# Patient Record
Sex: Male | Born: 1935
Health system: Southern US, Community
[De-identification: ages and names within clinical notes are randomized; demographics above are authoritative.]

## PROBLEM LIST (undated history)

## (undated) DIAGNOSIS — I10 Essential (primary) hypertension: Secondary | ICD-10-CM

## (undated) DIAGNOSIS — H9190 Unspecified hearing loss, unspecified ear: Secondary | ICD-10-CM

## (undated) DIAGNOSIS — Z87442 Personal history of urinary calculi: Secondary | ICD-10-CM

## (undated) DIAGNOSIS — E119 Type 2 diabetes mellitus without complications: Secondary | ICD-10-CM

## (undated) DIAGNOSIS — G629 Polyneuropathy, unspecified: Secondary | ICD-10-CM

## (undated) DIAGNOSIS — Z87438 Personal history of other diseases of male genital organs: Secondary | ICD-10-CM

## (undated) DIAGNOSIS — I2510 Atherosclerotic heart disease of native coronary artery without angina pectoris: Secondary | ICD-10-CM

## (undated) DIAGNOSIS — H409 Unspecified glaucoma: Secondary | ICD-10-CM

## (undated) DIAGNOSIS — I359 Nonrheumatic aortic valve disorder, unspecified: Secondary | ICD-10-CM

## (undated) DIAGNOSIS — N200 Calculus of kidney: Secondary | ICD-10-CM

## (undated) DIAGNOSIS — E785 Hyperlipidemia, unspecified: Secondary | ICD-10-CM

## (undated) DIAGNOSIS — I739 Peripheral vascular disease, unspecified: Secondary | ICD-10-CM

## (undated) DIAGNOSIS — Z22322 Carrier or suspected carrier of Methicillin resistant Staphylococcus aureus: Secondary | ICD-10-CM

## (undated) DIAGNOSIS — I219 Acute myocardial infarction, unspecified: Secondary | ICD-10-CM

## (undated) DIAGNOSIS — D649 Anemia, unspecified: Secondary | ICD-10-CM

## (undated) HISTORY — PX: CARDIAC SURGERY: SHX584

## (undated) HISTORY — PX: CORONARY ARTERY BYPASS GRAFT: SHX141

## (undated) HISTORY — PX: CARDIAC VALVE REPLACEMENT: SHX585

## (undated) HISTORY — PX: CARDIAC CATHETERIZATION: SHX172

---

## 2005-04-04 HISTORY — PX: VALVE REPLACEMENT: SUR13

## 2005-11-24 ENCOUNTER — Ambulatory Visit: Payer: Self-pay | Admitting: Internal Medicine

## 2009-05-20 ENCOUNTER — Ambulatory Visit: Payer: Self-pay | Admitting: Urology

## 2009-06-03 ENCOUNTER — Ambulatory Visit: Payer: Self-pay | Admitting: Urology

## 2013-11-07 DIAGNOSIS — IMO0002 Reserved for concepts with insufficient information to code with codable children: Secondary | ICD-10-CM | POA: Insufficient documentation

## 2013-11-07 DIAGNOSIS — D539 Nutritional anemia, unspecified: Secondary | ICD-10-CM | POA: Insufficient documentation

## 2013-11-07 DIAGNOSIS — R809 Proteinuria, unspecified: Secondary | ICD-10-CM | POA: Insufficient documentation

## 2013-11-07 DIAGNOSIS — E785 Hyperlipidemia, unspecified: Secondary | ICD-10-CM | POA: Insufficient documentation

## 2014-04-17 DIAGNOSIS — H4011X3 Primary open-angle glaucoma, severe stage: Secondary | ICD-10-CM | POA: Diagnosis not present

## 2014-06-02 DIAGNOSIS — I2510 Atherosclerotic heart disease of native coronary artery without angina pectoris: Secondary | ICD-10-CM | POA: Diagnosis not present

## 2014-06-02 DIAGNOSIS — I10 Essential (primary) hypertension: Secondary | ICD-10-CM | POA: Diagnosis not present

## 2014-06-02 DIAGNOSIS — H6123 Impacted cerumen, bilateral: Secondary | ICD-10-CM | POA: Diagnosis not present

## 2014-06-02 DIAGNOSIS — E1165 Type 2 diabetes mellitus with hyperglycemia: Secondary | ICD-10-CM | POA: Diagnosis not present

## 2014-06-05 DIAGNOSIS — E1165 Type 2 diabetes mellitus with hyperglycemia: Secondary | ICD-10-CM | POA: Diagnosis not present

## 2014-06-16 DIAGNOSIS — H903 Sensorineural hearing loss, bilateral: Secondary | ICD-10-CM | POA: Diagnosis not present

## 2014-06-16 DIAGNOSIS — H6123 Impacted cerumen, bilateral: Secondary | ICD-10-CM | POA: Diagnosis not present

## 2014-07-14 DIAGNOSIS — Z7901 Long term (current) use of anticoagulants: Secondary | ICD-10-CM | POA: Diagnosis not present

## 2014-08-21 DIAGNOSIS — E1165 Type 2 diabetes mellitus with hyperglycemia: Secondary | ICD-10-CM | POA: Diagnosis not present

## 2014-08-21 DIAGNOSIS — I10 Essential (primary) hypertension: Secondary | ICD-10-CM | POA: Diagnosis not present

## 2014-08-26 DIAGNOSIS — H4011X2 Primary open-angle glaucoma, moderate stage: Secondary | ICD-10-CM | POA: Diagnosis not present

## 2014-08-29 DIAGNOSIS — I2510 Atherosclerotic heart disease of native coronary artery without angina pectoris: Secondary | ICD-10-CM | POA: Diagnosis not present

## 2014-08-29 DIAGNOSIS — I10 Essential (primary) hypertension: Secondary | ICD-10-CM | POA: Diagnosis not present

## 2014-08-29 DIAGNOSIS — E1165 Type 2 diabetes mellitus with hyperglycemia: Secondary | ICD-10-CM | POA: Diagnosis not present

## 2014-10-17 DIAGNOSIS — H8149 Vertigo of central origin, unspecified ear: Secondary | ICD-10-CM | POA: Diagnosis not present

## 2014-10-24 DIAGNOSIS — H8110 Benign paroxysmal vertigo, unspecified ear: Secondary | ICD-10-CM | POA: Diagnosis not present

## 2014-11-04 ENCOUNTER — Emergency Department: Payer: Commercial Managed Care - HMO

## 2014-11-04 ENCOUNTER — Observation Stay
Admission: EM | Admit: 2014-11-04 | Discharge: 2014-11-06 | Disposition: A | Discharge status: Home / Self Care | Payer: Commercial Managed Care - HMO | Attending: Internal Medicine | Admitting: Internal Medicine

## 2014-11-04 DIAGNOSIS — R531 Weakness: Secondary | ICD-10-CM | POA: Diagnosis not present

## 2014-11-04 DIAGNOSIS — I63441 Cerebral infarction due to embolism of right cerebellar artery: Secondary | ICD-10-CM | POA: Diagnosis present

## 2014-11-04 DIAGNOSIS — Z720 Tobacco use: Secondary | ICD-10-CM | POA: Diagnosis not present

## 2014-11-04 DIAGNOSIS — R29898 Other symptoms and signs involving the musculoskeletal system: Secondary | ICD-10-CM | POA: Diagnosis not present

## 2014-11-04 DIAGNOSIS — R42 Dizziness and giddiness: Secondary | ICD-10-CM | POA: Diagnosis not present

## 2014-11-04 DIAGNOSIS — R55 Syncope and collapse: Secondary | ICD-10-CM | POA: Diagnosis not present

## 2014-11-04 DIAGNOSIS — N400 Enlarged prostate without lower urinary tract symptoms: Secondary | ICD-10-CM | POA: Insufficient documentation

## 2014-11-04 DIAGNOSIS — Z8249 Family history of ischemic heart disease and other diseases of the circulatory system: Secondary | ICD-10-CM | POA: Diagnosis not present

## 2014-11-04 DIAGNOSIS — R270 Ataxia, unspecified: Secondary | ICD-10-CM | POA: Insufficient documentation

## 2014-11-04 DIAGNOSIS — S0990XA Unspecified injury of head, initial encounter: Secondary | ICD-10-CM | POA: Diagnosis not present

## 2014-11-04 DIAGNOSIS — E162 Hypoglycemia, unspecified: Secondary | ICD-10-CM | POA: Diagnosis present

## 2014-11-04 DIAGNOSIS — Z8673 Personal history of transient ischemic attack (TIA), and cerebral infarction without residual deficits: Secondary | ICD-10-CM | POA: Insufficient documentation

## 2014-11-04 DIAGNOSIS — I10 Essential (primary) hypertension: Secondary | ICD-10-CM | POA: Diagnosis not present

## 2014-11-04 DIAGNOSIS — M6281 Muscle weakness (generalized): Secondary | ICD-10-CM | POA: Diagnosis not present

## 2014-11-04 DIAGNOSIS — I639 Cerebral infarction, unspecified: Secondary | ICD-10-CM

## 2014-11-04 DIAGNOSIS — E11649 Type 2 diabetes mellitus with hypoglycemia without coma: Secondary | ICD-10-CM | POA: Diagnosis not present

## 2014-11-04 DIAGNOSIS — E785 Hyperlipidemia, unspecified: Secondary | ICD-10-CM | POA: Diagnosis not present

## 2014-11-04 DIAGNOSIS — I739 Peripheral vascular disease, unspecified: Secondary | ICD-10-CM | POA: Insufficient documentation

## 2014-11-04 HISTORY — DX: Essential (primary) hypertension: I10

## 2014-11-04 HISTORY — DX: Type 2 diabetes mellitus without complications: E11.9

## 2014-11-04 LAB — URINALYSIS COMPLETE WITH MICROSCOPIC (ARMC ONLY)
BACTERIA UA: NONE SEEN
BILIRUBIN URINE: NEGATIVE
Glucose, UA: NEGATIVE mg/dL
Hgb urine dipstick: NEGATIVE
KETONES UR: NEGATIVE mg/dL
LEUKOCYTES UA: NEGATIVE
Nitrite: NEGATIVE
Protein, ur: 100 mg/dL — AB
Specific Gravity, Urine: 1.015 (ref 1.005–1.030)
Squamous Epithelial / LPF: NONE SEEN
pH: 5 (ref 5.0–8.0)

## 2014-11-04 LAB — CBC
HEMATOCRIT: 37.8 % — AB (ref 40.0–52.0)
Hemoglobin: 13.2 g/dL (ref 13.0–18.0)
MCH: 31.8 pg (ref 26.0–34.0)
MCHC: 34.8 g/dL (ref 32.0–36.0)
MCV: 91.2 fL (ref 80.0–100.0)
PLATELETS: 248 10*3/uL (ref 150–440)
RBC: 4.14 MIL/uL — ABNORMAL LOW (ref 4.40–5.90)
RDW: 13.8 % (ref 11.5–14.5)
WBC: 7.1 10*3/uL (ref 3.8–10.6)

## 2014-11-04 LAB — GLUCOSE, CAPILLARY
GLUCOSE-CAPILLARY: 106 mg/dL — AB (ref 65–99)
Glucose-Capillary: 56 mg/dL — ABNORMAL LOW (ref 65–99)
Glucose-Capillary: 88 mg/dL (ref 65–99)

## 2014-11-04 LAB — BASIC METABOLIC PANEL
Anion gap: 10 (ref 5–15)
BUN: 17 mg/dL (ref 6–20)
CALCIUM: 9.5 mg/dL (ref 8.9–10.3)
CHLORIDE: 107 mmol/L (ref 101–111)
CO2: 22 mmol/L (ref 22–32)
CREATININE: 1.01 mg/dL (ref 0.61–1.24)
GFR calc Af Amer: >60 mL/min (ref >60)
GFR calc non Af Amer: >60 mL/min (ref >60)
Glucose, Bld: 64 mg/dL — ABNORMAL LOW (ref 65–99)
POTASSIUM: 4.4 mmol/L (ref 3.5–5.1)
SODIUM: 139 mmol/L (ref 135–145)

## 2014-11-04 NOTE — ED Notes (Signed)
Pt provided urinal and instructed to clean catch urine

## 2014-11-04 NOTE — ED Notes (Signed)
Pt reports feeling dizzy x 3 weeks. Pt has been to pcp, prescribed valium and meclizine for vertigo. Pt bs was 56 upon arrival. Given OJ and pt reports improvement of symptoms.

## 2014-11-04 NOTE — ED Provider Notes (Signed)
[facility] Emergency Department Provider Note  Time seen: 10:56 PM  I have reviewed the triage vital signs and the nursing notes.   HISTORY  Chief Complaint Weakness    HPI Matthew Bruce is a 81 y.o. male with past medical history of diabetes and hypertension who presents to the emergency department generalized weakness for 3 weeks. According to the patient and the son the patient has been having intermittent spells of generalized weakness and dizziness. He has seen his primary care doctor (Dr. Jefm Bryant) for the same twice. Patient denies any focal weakness or numbness. Denies headache, slurred speech or confusion. He describes his weakness as the culprit getting out of chairs or out of bed by himself. Upon arrival to the emergency department the patient's blood glucose was 56. The patient was given orange she is, and states his symptoms greatly improved. Describes his weakness is mild currently.     Past Medical History  Diagnosis Date  . Diabetes mellitus without complication   . Hypertension     There are no active problems to display for this patient.   Past Surgical History  Procedure Laterality Date  . Valve replacement    . Cardiac surgery      No current outpatient prescriptions on file.  Allergies Review of patient's allergies indicates no known allergies.  No family history on file.  Social History History  Substance Use Topics  . Smoking status: Former Smoker    Types: Cigarettes  . Smokeless tobacco: Current User    Types: Chew  . Alcohol Use: No    Review of Systems Constitutional: Negative for fever Cardiovascular: Negative for chest pain. Respiratory: Negative for shortness of breath. Gastrointestinal: Negative for abdominal pain Genitourinary: Negative for dysuria. Does state some incontinence but states this is times several years. No acute changes. Musculoskeletal: Negative for back pain 10-point ROS otherwise  negative.  ____________________________________________   PHYSICAL EXAM:  VITAL SIGNS: ED Triage Vitals  Enc Vitals Group     BP 11/04/14 1903 157/59 mmHg     Pulse Rate 11/04/14 1903 64     Resp 11/04/14 1903 16     Temp 11/04/14 1903 98.3 F (36.8 C)     Temp Source 11/04/14 1903 Oral     SpO2 11/04/14 1903 97 %     Weight 11/04/14 1903 202 lb (91.627 kg)     Height 11/04/14 1903 6' (1.829 m)     Head Cir --      Peak Flow --      Pain Score 11/04/14 1904 0     Pain Loc --      Pain Edu? --      Excl. in [place]? --     Constitutional: Alert and oriented. Well appearing and in no distress. ENT   Head: Normocephalic and atraumatic.   Mouth/Throat: Mucous membranes are moist. Cardiovascular: Normal rate, regular rhythm Respiratory: Normal respiratory effort without tachypnea nor retractions. Breath sounds are clear and equal bilaterally. No wheezes/rales/rhonchi. Gastrointestinal: Soft and nontender. No distention. Musculoskeletal: Nontender with normal range of motion in all extremities. No lower extremity tenderness or edema. Neurologic:  Normal speech and language. No gross focal neurologic deficits are appreciated. Speech is normal. 5/5 motor in all extremities. No pronator drift, no lower extremity drift. Skin:  Skin is warm, dry and intact.  Psychiatric: Mood and affect are normal. Speech and behavior are normal.   ____________________________________________    EKG  EKG reviewed and interpreted by myself  shows sinus rhythm at 65 bpm. Occasional PVC. Narrow QRS, left axis deviation, nonspecific but no concerning ST changes noted.  ____________________________________________    [id number]  CT pending  ____________________________________________    INITIAL IMPRESSION / ASSESSMENT AND PLAN / ED COURSE  Pertinent labs & imaging results that were available during my care of the patient were reviewed by me and considered in my medical decision making (see  chart for details).  Patient with intermittent dizziness and lightheadedness. Upon arrival of patient's blood glucose was 56, the patient was given orange juice and felt considerably better and states his symptoms improved. Symptoms could be due to intermittent hypoglycemia. The patient has a glucometer at home but is not been checking his blood glucose regularly. Patient has a good neurologic exam currently, 5/5 motor in all extremities. Lab work has resulted within normal limits. We are currently awaiting urinalysis, and CT head results. I discussed with the patient/son the patient needs to check his blood glucose regularly at home. Patient care signed out to Dr. Owens Shark.  ____________________________________________   FINAL CLINICAL IMPRESSION(S) / ED DIAGNOSES  Weakness   Harvest Dark, MD 11/04/14 [phone]

## 2014-11-04 NOTE — ED Notes (Signed)
Pt brought to ED from [facility]: Matthew Bruce. Pt experiencing weakness in legs and dizziness for the last 3 wks.  Pt sts he last fell Sunday.  Pt A/O x 4.

## 2014-11-04 NOTE — ED Notes (Signed)
Post void bladder scan 13 ML

## 2014-11-05 ENCOUNTER — Encounter: Payer: Self-pay | Admitting: Internal Medicine

## 2014-11-05 ENCOUNTER — Observation Stay: Payer: Commercial Managed Care - HMO

## 2014-11-05 ENCOUNTER — Observation Stay (HOSPITAL_BASED_OUTPATIENT_CLINIC_OR_DEPARTMENT_OTHER)
Admit: 2014-11-05 | Discharge: 2014-11-05 | Disposition: A | Discharge status: Home / Self Care | Payer: Commercial Managed Care - HMO | Attending: Internal Medicine | Admitting: Internal Medicine

## 2014-11-05 DIAGNOSIS — R55 Syncope and collapse: Secondary | ICD-10-CM | POA: Diagnosis not present

## 2014-11-05 DIAGNOSIS — E11649 Type 2 diabetes mellitus with hypoglycemia without coma: Secondary | ICD-10-CM | POA: Diagnosis not present

## 2014-11-05 DIAGNOSIS — I350 Nonrheumatic aortic (valve) stenosis: Secondary | ICD-10-CM

## 2014-11-05 DIAGNOSIS — I10 Essential (primary) hypertension: Secondary | ICD-10-CM | POA: Diagnosis not present

## 2014-11-05 DIAGNOSIS — R531 Weakness: Secondary | ICD-10-CM | POA: Diagnosis not present

## 2014-11-05 DIAGNOSIS — N400 Enlarged prostate without lower urinary tract symptoms: Secondary | ICD-10-CM | POA: Diagnosis not present

## 2014-11-05 DIAGNOSIS — I739 Peripheral vascular disease, unspecified: Secondary | ICD-10-CM | POA: Diagnosis not present

## 2014-11-05 DIAGNOSIS — R270 Ataxia, unspecified: Secondary | ICD-10-CM | POA: Diagnosis not present

## 2014-11-05 DIAGNOSIS — R42 Dizziness and giddiness: Secondary | ICD-10-CM | POA: Diagnosis not present

## 2014-11-05 DIAGNOSIS — I6523 Occlusion and stenosis of bilateral carotid arteries: Secondary | ICD-10-CM | POA: Diagnosis not present

## 2014-11-05 DIAGNOSIS — E785 Hyperlipidemia, unspecified: Secondary | ICD-10-CM | POA: Diagnosis not present

## 2014-11-05 DIAGNOSIS — E162 Hypoglycemia, unspecified: Secondary | ICD-10-CM | POA: Diagnosis present

## 2014-11-05 DIAGNOSIS — G4730 Sleep apnea, unspecified: Secondary | ICD-10-CM | POA: Diagnosis not present

## 2014-11-05 LAB — GLUCOSE, CAPILLARY
GLUCOSE-CAPILLARY: 168 mg/dL — AB (ref 65–99)
GLUCOSE-CAPILLARY: 174 mg/dL — AB (ref 65–99)
GLUCOSE-CAPILLARY: 189 mg/dL — AB (ref 65–99)
GLUCOSE-CAPILLARY: 191 mg/dL — AB (ref 65–99)
GLUCOSE-CAPILLARY: 209 mg/dL — AB (ref 65–99)
GLUCOSE-CAPILLARY: 224 mg/dL — AB (ref 65–99)
GLUCOSE-CAPILLARY: 233 mg/dL — AB (ref 65–99)
GLUCOSE-CAPILLARY: 65 mg/dL (ref 65–99)
Glucose-Capillary: 182 mg/dL — ABNORMAL HIGH (ref 65–99)
Glucose-Capillary: 234 mg/dL — ABNORMAL HIGH (ref 65–99)
Glucose-Capillary: 205 mg/dL — ABNORMAL HIGH (ref 65–99)

## 2014-11-05 LAB — HEMOGLOBIN A1C: HEMOGLOBIN A1C: 6.3 % — AB (ref 4.0–6.0)

## 2014-11-05 MED ORDER — METFORMIN HCL 500 MG PO TABS
1000.0000 mg | ORAL_TABLET | Freq: Two times a day (BID) | ORAL | Status: DC
  Administered 2014-11-05 – 2014-11-06 (×2): 1000 mg via ORAL
  Filled 2014-11-05 (×2): qty 2

## 2014-11-05 MED ORDER — DEXTROSE 10% NICU IV INFUSION SIMPLE
INJECTION | INTRAVENOUS | Status: DC
  Administered 2014-11-05: 100 mL/h via INTRAVENOUS
  Filled 2014-11-05 (×3): qty 500

## 2014-11-05 MED ORDER — TAMSULOSIN HCL 0.4 MG PO CAPS
0.4000 mg | ORAL_CAPSULE | Freq: Every day | ORAL | Status: DC
  Administered 2014-11-05: 0.4 mg via ORAL
  Filled 2014-11-05 (×2): qty 1

## 2014-11-05 MED ORDER — ASPIRIN 81 MG PO CHEW: 81.0000 mg | CHEWABLE_TABLET | Freq: Every day | ORAL | Status: DC

## 2014-11-05 MED ORDER — FINASTERIDE 5 MG PO TABS
5.0000 mg | ORAL_TABLET | Freq: Every day | ORAL | Status: DC
  Administered 2014-11-05 – 2014-11-06 (×2): 5 mg via ORAL
  Filled 2014-11-05 (×2): qty 1

## 2014-11-05 MED ORDER — INSULIN ASPART 100 UNIT/ML ~~LOC~~ SOLN
0.0000 [IU] | Freq: Three times a day (TID) | SUBCUTANEOUS | Status: DC
  Administered 2014-11-05 – 2014-11-06 (×2): 3 [IU] via SUBCUTANEOUS
  Filled 2014-11-05: qty 3
  Filled 2014-11-05: qty 4

## 2014-11-05 MED ORDER — MORPHINE SULFATE 2 MG/ML IJ SOLN: 2.0000 mg | INTRAMUSCULAR | Status: DC | PRN

## 2014-11-05 MED ORDER — ACETAMINOPHEN 325 MG PO TABS: 650.0000 mg | ORAL_TABLET | Freq: Four times a day (QID) | ORAL | Status: DC | PRN

## 2014-11-05 MED ORDER — OXYCODONE HCL 5 MG PO TABS: 5.0000 mg | ORAL_TABLET | ORAL | Status: DC | PRN

## 2014-11-05 MED ORDER — ACETAMINOPHEN 650 MG RE SUPP: 650.0000 mg | Freq: Four times a day (QID) | RECTAL | Status: DC | PRN

## 2014-11-05 MED ORDER — VERAPAMIL HCL ER 120 MG PO TBCR
180.0000 mg | EXTENDED_RELEASE_TABLET | Freq: Every day | ORAL | Status: DC
Start: 2014-11-05 — End: 2014-11-05
  Filled 2014-11-05: qty 2

## 2014-11-05 MED ORDER — HEPARIN SODIUM (PORCINE) 5000 UNIT/ML IJ SOLN
5000.0000 [IU] | Freq: Three times a day (TID) | INTRAMUSCULAR | Status: DC
  Administered 2014-11-05 – 2014-11-06 (×4): 5000 [IU] via SUBCUTANEOUS
  Filled 2014-11-05 (×4): qty 1

## 2014-11-05 MED ORDER — ONDANSETRON HCL 4 MG PO TABS: 4.0000 mg | ORAL_TABLET | Freq: Four times a day (QID) | ORAL | Status: DC | PRN

## 2014-11-05 MED ORDER — INSULIN ASPART 100 UNIT/ML ~~LOC~~ SOLN: 0.0000 [IU] | Freq: Every day | SUBCUTANEOUS | Status: DC

## 2014-11-05 MED ORDER — DEXTROSE-NACL 5-0.9 % IV SOLN
INTRAVENOUS | Status: DC
  Administered 2014-11-05: 02:00:00 via INTRAVENOUS

## 2014-11-05 MED ORDER — AMLODIPINE BESYLATE 5 MG PO TABS
5.0000 mg | ORAL_TABLET | Freq: Every day | ORAL | Status: DC
  Administered 2014-11-05 – 2014-11-06 (×2): 5 mg via ORAL
  Filled 2014-11-05 (×2): qty 1

## 2014-11-05 MED ORDER — ONDANSETRON HCL 4 MG/2ML IJ SOLN: 4.0000 mg | Freq: Four times a day (QID) | INTRAMUSCULAR | Status: DC | PRN

## 2014-11-05 MED ORDER — ASPIRIN EC 81 MG PO TBEC
81.0000 mg | DELAYED_RELEASE_TABLET | Freq: Every day | ORAL | Status: DC
  Administered 2014-11-05 – 2014-11-06 (×2): 81 mg via ORAL
  Filled 2014-11-05 (×2): qty 1

## 2014-11-05 MED ORDER — VERAPAMIL HCL ER 180 MG PO TBCR
180.0000 mg | EXTENDED_RELEASE_TABLET | Freq: Every day | ORAL | Status: DC
  Administered 2014-11-05 – 2014-11-06 (×2): 180 mg via ORAL
  Filled 2014-11-05 (×2): qty 1

## 2014-11-05 MED ORDER — MECLIZINE HCL 25 MG PO TABS: 12.5000 mg | ORAL_TABLET | Freq: Four times a day (QID) | ORAL | Status: DC | PRN

## 2014-11-05 MED ORDER — PRAVASTATIN SODIUM 20 MG PO TABS
40.0000 mg | ORAL_TABLET | Freq: Every day | ORAL | Status: DC
  Administered 2014-11-05: 40 mg via ORAL
  Filled 2014-11-05: qty 2

## 2014-11-05 NOTE — H&P (Signed)
[place] [facility] [place] NAME: Matthew Bruce    MR#:  [id number]  DATE OF BIRTH:  25-Mar-1927   DATE OF ADMISSION:  11/04/2014  PRIMARY CARE PHYSICIAN: walker  REQUESTING/REFERRING PHYSICIAN: Owens Shark  CHIEF COMPLAINT:   Chief Complaint  Patient presents with  . Weakness    HISTORY OF PRESENT ILLNESS:  Matthew Bruce  is a 81 y.o. male with a known history of essential hypertension type 2 diabetes uncomplicated presenting with weakness. He is been complaining of generalized weakness with associated dizziness for approximately 3 weeks in total duration. Upon presentation he was noted to be hypoglycemic after some improvement in his sugar symptoms improved however, he became more hypoglycemic as the night went on requiring fluids.  PAST MEDICAL HISTORY:   Past Medical History  Diagnosis Date  . Diabetes mellitus without complication   . Hypertension     PAST SURGICAL HISTORY:   Past Surgical History  Procedure Laterality Date  . Valve replacement    . Cardiac surgery      SOCIAL HISTORY:   History  Substance Use Topics  . Smoking status: Former Smoker    Types: Cigarettes  . Smokeless tobacco: Current User    Types: Chew  . Alcohol Use: No    FAMILY HISTORY:   Family History  Problem Relation Age of Onset  . Heart failure Neg Hx     DRUG ALLERGIES:  No Known Allergies  REVIEW OF SYSTEMS:  REVIEW OF SYSTEMS:  CONSTITUTIONAL: Denies fevers, chills, fatigue, positive weakness.  EYES: Denies blurred vision, double vision, or eye pain.  EARS, NOSE, THROAT: Denies tinnitus, ear pain, hearing loss.  RESPIRATORY: denies cough, shortness of breath, wheezing  CARDIOVASCULAR: Denies chest pain, palpitations, edema.  GASTROINTESTINAL: Denies nausea, vomiting, diarrhea, abdominal pain.  GENITOURINARY: Denies dysuria, hematuria.  ENDOCRINE: Denies nocturia or thyroid problems. HEMATOLOGIC AND LYMPHATIC: Denies easy  bruising or bleeding.  SKIN: Denies rash or lesions.  MUSCULOSKELETAL: Denies pain in neck, back, shoulder, knees, hips, or further arthritic symptoms.  NEUROLOGIC: Denies paralysis, paresthesias.  PSYCHIATRIC: Denies anxiety or depressive symptoms. Otherwise full review of systems performed by me is negative.   MEDICATIONS AT HOME:   Prior to Admission medications   Not on File      VITAL SIGNS:  Blood pressure 180/63, pulse 50, temperature 98.3 F (36.8 C), temperature source Oral, resp. rate 16, height 6' (1.829 m), weight 202 lb (91.627 kg), SpO2 98 %.  PHYSICAL EXAMINATION:  VITAL SIGNS: Filed Vitals:   11/04/14 2330  BP: 180/63  Pulse: 50  Temp:   Resp:    GENERAL:81 y.o.male currently in no acute distress.  HEAD: Normocephalic, atraumatic.  EYES: Pupils equal, round, reactive to light. Extraocular muscles intact. No scleral icterus.  MOUTH: Moist mucosal membrane. Dentition intact. No abscess noted.  EAR, NOSE, THROAT: Clear without exudates. No external lesions.  NECK: Supple. No thyromegaly. No nodules. No JVD.  PULMONARY: Clear to ascultation, without wheeze rails or rhonci. No use of accessory muscles, Good respiratory effort. good air entry bilaterally CHEST: Nontender to palpation.  CARDIOVASCULAR: S1 and S2. Regular rate and rhythm. No murmurs, rubs, or gallops. No edema. Pedal pulses 2+ bilaterally.  GASTROINTESTINAL: Soft, nontender, nondistended. No masses. Positive bowel sounds. No hepatosplenomegaly.  MUSCULOSKELETAL: No swelling, clubbing, or edema. Range of motion full in all extremities.  NEUROLOGIC: Cranial nerves II through XII are intact. No gross focal neurological deficits. Sensation intact. Reflexes intact.  SKIN: No  ulceration, lesions, rashes, or cyanosis. Skin warm and dry. Turgor intact.  PSYCHIATRIC: Mood, affect within normal limits. The patient is awake, alert and oriented x 3. Insight, judgment intact.    LABORATORY PANEL:    CBC  Recent Labs Lab 11/04/14 1911  WBC 7.1  HGB 13.2  HCT 37.8*  PLT 248   ------------------------------------------------------------------------------------------------------------------  Chemistries   Recent Labs Lab 11/04/14 1911  NA 139  K 4.4  CL 107  CO2 22  GLUCOSE 64*  BUN 17  CREATININE 1.01  CALCIUM 9.5   ------------------------------------------------------------------------------------------------------------------  Cardiac Enzymes No results for input(s): TROPONINI in the last 168 hours. ------------------------------------------------------------------------------------------------------------------  RADIOLOGY:  Ct Head Wo Contrast  11/04/2014   CLINICAL DATA:  Subacute onset of generalized leg weakness and dizziness. Recent fall. Initial encounter.  EXAM: CT HEAD WITHOUT CONTRAST  TECHNIQUE: Contiguous axial images were obtained from the base of the skull through the vertex without intravenous contrast.  COMPARISON:  None.  FINDINGS: There is no evidence of acute infarction, mass lesion, or intra- or extra-axial hemorrhage on CT.  Prominence of the ventricles and sulci reflects moderate cortical volume loss. Cerebellar atrophy is noted. Scattered periventricular and subcortical white matter change likely reflects small vessel ischemic microangiopathy. A few small chronic lacunar infarcts are seen at the right cerebellar hemisphere.  The brainstem and fourth ventricle are within normal limits. The basal ganglia are unremarkable in appearance. The cerebral hemispheres demonstrate grossly normal gray-white differentiation. No mass effect or midline shift is seen.  There is no evidence of fracture; visualized osseous structures are unremarkable in appearance. The visualized portions of the orbits are within normal limits. There is partial opacification of the ethmoid air cells. There is mild partial opacification of the left mastoid air cells. The paranasal sinuses  and right mastoid air cells are well-aerated. No significant soft tissue abnormalities are seen.  IMPRESSION: 1. No acute intracranial pathology seen on CT. 2. Moderate cortical volume loss and scattered small vessel ischemic microangiopathy. Few small chronic lacunar infarcts at the right cerebellar hemisphere. 3. Mild partial opacification of the left mastoid air cells.   Electronically Signed   By: Garald Balding M.D.   On: 11/04/2014 23:20    EKG:   Orders placed or performed during the hospital encounter of 11/04/14  . ED EKG  . ED EKG    IMPRESSION AND PLAN:   81 year old Caucasian gentleman history of type 2 diabetes uncomplicated presenting with generalized weakness found to be hypoglycemic.  1. Type 2 diabetes complicated with hypoglycemia without long-term insulin use: Start D5 infusion monitor Accu-Cheks every 2 hours until glucose stabilize hold diabetic agents 2. Essential hypertension: Verapamil 3. Hypopnea specified: Statin therapy 4. BPH: Flomax 5. Venous embolism prophylactic: Heparin subcutaneous    All the records are reviewed and case discussed with ED provider. Management plans discussed with the patient, family and they are in agreement.  CODE STATUS: Full  TOTAL TIME TAKING CARE OF THIS PATIENT: 35 minutes.    Jenefer Woerner,  Karenann Cai.D on 11/05/2014 at 1:24 AM  Between 7am to 6pm - Pager - [phone]  After 6pm: House Pager: - [phone]  [facility]  Office  [phone]  CC: Primary care physician; Lisette Grinder III

## 2014-11-05 NOTE — ED Provider Notes (Addendum)
I assumed care of the patient at 11:00 PM patient CT scan revealed:    CT Head Wo Contrast (Final result) Result time: 11/04/14 23:20:35   Final result by Rad Results In Interface (11/04/14 23:20:35)   Narrative:   CLINICAL DATA: Subacute onset of generalized leg weakness and dizziness. Recent fall. Initial encounter.  EXAM: CT HEAD WITHOUT CONTRAST  TECHNIQUE: Contiguous axial images were obtained from the base of the skull through the vertex without intravenous contrast.  COMPARISON: None.  FINDINGS: There is no evidence of acute infarction, mass lesion, or intra- or extra-axial hemorrhage on CT.  Prominence of the ventricles and sulci reflects moderate cortical volume loss. Cerebellar atrophy is noted. Scattered periventricular and subcortical white matter change likely reflects small vessel ischemic microangiopathy. A few small chronic lacunar infarcts are seen at the right cerebellar hemisphere.  The brainstem and fourth ventricle are within normal limits. The basal ganglia are unremarkable in appearance. The cerebral hemispheres demonstrate grossly normal gray-white differentiation. No mass effect or midline shift is seen.  There is no evidence of fracture; visualized osseous structures are unremarkable in appearance. The visualized portions of the orbits are within normal limits. There is partial opacification of the ethmoid air cells. There is mild partial opacification of the left mastoid air cells. The paranasal sinuses and right mastoid air cells are well-aerated. No significant soft tissue abnormalities are seen.  IMPRESSION: 1. No acute intracranial pathology seen on CT. 2. Moderate cortical volume loss and scattered small vessel ischemic microangiopathy. Few small chronic lacunar infarcts at the right cerebellar hemisphere. 3. Mild partial opacification of the left mastoid air cells.   Electronically Signed By: Garald Balding M.D. On:  11/04/2014 23:20   Patient's repeat glucose 65 decreased from 106 given long-acting sulfonylurea patient be admitted to the hospital for continued glucose monitoring   Gregor Hams, MD 11/05/14 2778  Gregor Hams, MD 11/05/14 [phone]

## 2014-11-05 NOTE — Plan of Care (Signed)
Problem: Discharge Progression Outcomes Goal: Other Discharge Outcomes/Goals Outcome: Progressing Plan of Care Progress to Goal:  Pt came in last night w/hypoglycemia which seems resolved.  D5 stopped.  Q2hr finger stick d/ced now [employer].  Also metformin started.  Pt c/o severe dizziness.  Old infarcts present in cerebellum. Does have some cerumen build up.  Will have neuro consult.  ECHO performed and U/S of carotids.  Wked w/PT today.  Walked w/rolling walker and a lot of  Assistance.

## 2014-11-05 NOTE — Progress Notes (Signed)
*  PRELIMINARY RESULTS* Echocardiogram 2D Echocardiogram has been performed.  Laqueta Jean Hege 11/05/2014, 12:33 PM

## 2014-11-05 NOTE — Progress Notes (Signed)
[place] at [place] NAME: Matthew Bruce    MR#:  [id number]  DATE OF BIRTH:  1926-11-13  SUBJECTIVE:  CHIEF COMPLAINT:   Chief Complaint  Patient presents with  . Weakness   Continues to feel dizzy with virtually any movement. No nausea. Denies symptoms of vertigo. Blood sugar has returned to normal.  REVIEW OF SYSTEMS:   Review of Systems  Constitutional: Negative for fever.  Respiratory: Negative for shortness of breath.   Cardiovascular: Negative for chest pain and palpitations.  Gastrointestinal: Negative for nausea, vomiting and abdominal pain.  Genitourinary: Negative for dysuria.  Neurological: Positive for dizziness. Negative for tingling, sensory change, speech change, focal weakness and loss of consciousness.    DRUG ALLERGIES:  No Known Allergies  VITALS:  Blood pressure 151/63, pulse 61, temperature 97.8 F (36.6 C), temperature source Oral, resp. rate 16, height 6' (1.829 m), weight 91.627 kg (202 lb), SpO2 96 %.  PHYSICAL EXAMINATION:  GENERAL:  81 y.o.-year-old patient sitting in the bed with no acute distress.  EYES: Pupils equal, round, reactive to light and accommodation. No scleral icterus. Extraocular muscles intact.  HEENT: Head atraumatic, normocephalic. Oropharynx and nasopharynx clear. Mucous membranes are moist  NECK:  Supple, no jugular venous distention. No thyroid enlargement, no tenderness.  LUNGS: Normal breath sounds bilaterally, no wheezing, rales,rhonchi or crepitation. No use of accessory muscles of respiration.  CARDIOVASCULAR: S1, S2 normal. No murmurs, rubs, or gallops.  ABDOMEN: Soft, nontender, nondistended. Bowel sounds present. No organomegaly or mass.  EXTREMITIES: No pedal edema, cyanosis, or clubbing.  NEUROLOGIC: Cranial nerves II through XII are intact. Muscle strength 5/5 in all extremities. Sensation intact. Gait not checked.  PSYCHIATRIC: The patient is alert and oriented  x 3. Calm SKIN: No obvious rash, lesion, or ulcer.    LABORATORY PANEL:   CBC  Recent Labs Lab 11/04/14 1911  WBC 7.1  HGB 13.2  HCT 37.8*  PLT 248   ------------------------------------------------------------------------------------------------------------------  Chemistries   Recent Labs Lab 11/04/14 1911  NA 139  K 4.4  CL 107  CO2 22  GLUCOSE 64*  BUN 17  CREATININE 1.01  CALCIUM 9.5   ------------------------------------------------------------------------------------------------------------------  Cardiac Enzymes No results for input(s): TROPONINI in the last 168 hours. ------------------------------------------------------------------------------------------------------------------  RADIOLOGY:  Ct Head Wo Contrast  11/04/2014   CLINICAL DATA:  Subacute onset of generalized leg weakness and dizziness. Recent fall. Initial encounter.  EXAM: CT HEAD WITHOUT CONTRAST  TECHNIQUE: Contiguous axial images were obtained from the base of the skull through the vertex without intravenous contrast.  COMPARISON:  None.  FINDINGS: There is no evidence of acute infarction, mass lesion, or intra- or extra-axial hemorrhage on CT.  Prominence of the ventricles and sulci reflects moderate cortical volume loss. Cerebellar atrophy is noted. Scattered periventricular and subcortical white matter change likely reflects small vessel ischemic microangiopathy. A few small chronic lacunar infarcts are seen at the right cerebellar hemisphere.  The brainstem and fourth ventricle are within normal limits. The basal ganglia are unremarkable in appearance. The cerebral hemispheres demonstrate grossly normal gray-white differentiation. No mass effect or midline shift is seen.  There is no evidence of fracture; visualized osseous structures are unremarkable in appearance. The visualized portions of the orbits are within normal limits. There is partial opacification of the ethmoid air cells. There is  mild partial opacification of the left mastoid air cells. The paranasal sinuses and right mastoid air cells are well-aerated. No significant soft tissue abnormalities  are seen.  IMPRESSION: 1. No acute intracranial pathology seen on CT. 2. Moderate cortical volume loss and scattered small vessel ischemic microangiopathy. Few small chronic lacunar infarcts at the right cerebellar hemisphere. 3. Mild partial opacification of the left mastoid air cells.   Electronically Signed   By: Garald Balding M.D.   On: 11/04/2014 23:20    EKG:   Orders placed or performed during the hospital encounter of 11/04/14  . ED EKG  . ED EKG  . EKG 12-Lead  . EKG 12-Lead    ASSESSMENT AND PLAN:   #1 hypoglycemia: Likely due to sulfonylurea. Held at this time. Blood sugars are escalating. He can resume metformin, carb modified diet, sliding scale. Check hemoglobin A1c  #2 remote right sided cerebellar CVA: This is likely the cause of his one-month history of dizziness. Check 2-D echocardiogram and carotid Dopplers start aspirin therapy. Physical therapy consultation. Neurology consultation.  #3 hypertension: Blood pressure slightly elevated at this time. Appropriate for age. Continue with home verapamil  #4 hyperlipidemia: Check lipids in the morning. Continue statin therapy  #5 BPH: Continue Flomax and Proscar.  All the records are reviewed and case discussed with Care Management/Social Workerr. Management plans discussed with the patient, family and they are in agreement.  CODE STATUS: Full  TOTAL TIME TAKING CARE OF THIS PATIENT: 35 minutes.  Greater than 50% of time spent in care coordination and counseling. Discussed care plan with the patient, his daughter and wife and also with nursing.  POSSIBLE D/C IN 1-2 DAYS, DEPENDING ON CLINICAL CONDITION.   Myrtis Ser M.D on 11/05/2014 at 12:42 PM  Between 7am to 6pm - Pager - [phone]  After 6pm go to [email or web address] - password [email or web address] [facility]  Office  [phone]  CC: Primary care physician; Emmaline Kluver, MD

## 2014-11-05 NOTE — Evaluation (Signed)
Physical Therapy Evaluation Patient Details Name: Matthew Bruce MRN: [id number] DOB: [birth year] Today's Date: 11/05/2014   History of Present Illness  pt admitted due to unsteadiness likely due to hypoglycemia. pt glucose now within PT treatable levels. pt reports he is feeling much better. pt had been unsteady per H & P x 3 weeks. there was remote right sided cerebellar CVA.   Clinical Impression  Pt presents with reduced LE strength, unsteadiness, impaired gait and balance after having an episode with hypoglycemia and possible remote right sided cerebellar CVA. Pts PLOF does not require AD or assistance for community ambulation.pt demonstrated slight R lean during initial ambulation in the hallway requiring assist and cues with RW. Pt reported feeling more unsteady at ~38f. O2 saturation did reduce to 85%, but recovered after sitting <159m. Pt had less R lean during his trip to the bathroom requiring only CGA and cues for safety. Pt would benefit from continued skilled PT services to address the above stated deficits to maximize safety, mobility and reduce fall risk.     Follow Up Recommendations Outpatient PT    Equipment Recommendations  Rolling walker with 5" wheels rolling walker    Recommendations for Other Services       Precautions / Restrictions        Mobility  Bed Mobility Overal bed mobility: Modified Independent             General bed mobility comments: pt transfered supine><sit/ sit><supine with supervision with bed rails  Transfers Overall transfer level: Modified independent Equipment used: Rolling walker (2 wheeled)             General transfer comment: sit to stand/ stand to sit with CGA using RW, cues for hand placement; Treatment: x 8 min provided +1 CGA assistance to the toilet using RW, assisted pt with undressing/ dressing with CGA for safety. SBA for sit to stand / stand to sit using grab bars from toilet. provided CGA for safety for  handwashing/drying, and CGA for ambuation back to the bed using RW. pt needed cues for hand placement for stand to sit.   Ambulation/Gait Ambulation/Gait assistance: Min assist Ambulation Distance (Feet): 60 Feet Assistive device: Rolling walker (2 wheeled) Gait Pattern/deviations: Step-through pattern;Decreased step length - right;Decreased step length - left;Narrow base of support;Staggering right     General Gait Details: pt ambulated x 6070fn hallway with +1 min A using RW. pt needed cues for safety with RW as well as assistance on the R side as he had a R lean throughout the distance.   Stairs            Wheelchair Mobility    Modified Rankin (Stroke Patients Only)       Balance Overall balance assessment: Modified Independent (R lean)  impaired sitting balance without UE support. Impaired standing balance without UE support.                                          Pertinent Vitals/Pain Pain Assessment: No/denies pain    Home Living Family/patient expects to be discharged to:: Private residence Living Arrangements: Spouse/significant other Available Help at Discharge: Family Type of Home: House Home Access:  (1 small step)     Home Layout: One level Home Equipment: Cane - single point      Prior Function Level of Independence: Independent  Comments: community Contractor        Extremity/Trunk Assessment   Upper Extremity Assessment: Overall WFL for tasks assessed           Lower Extremity Assessment: Generalized weakness         Communication   Communication: HOH  Cognition   Behavior During Therapy: WFL for tasks assessed/performed                        General Comments General comments (skin integrity, edema, etc.): skin intact    Exercises        Assessment/Plan    PT Assessment Patient needs continued PT services  PT Diagnosis Difficulty walking;Generalized  weakness   PT Problem List Decreased strength;Decreased activity tolerance;Decreased balance;Decreased mobility  PT Treatment Interventions DME instruction;Gait training;Functional mobility training;Therapeutic activities;Balance training;Neuromuscular re-education;Therapeutic exercise   PT Goals (Current goals can be found in the Care Plan section) Acute Rehab PT Goals Patient Stated Goal: go home PT Goal Formulation: With patient Time For Goal Achievement: 11/12/14 Potential to Achieve Goals: Good    Frequency Min 2X/week   Barriers to discharge        Co-evaluation               End of Session Equipment Utilized During Treatment: Gait belt Activity Tolerance: Patient tolerated treatment well Patient left: in bed;with bed alarm set;with call bell/phone within reach;with family/visitor present      Functional Limitation: Mobility: Walking and moving around Mobility: Walking and Moving Around Current Status (K0938): At least 40 percent but less than 60 percent impaired, limited or restricted Mobility: Walking and Moving Around Goal Status [phone]): At least 1 percent but less than 20 percent impaired, limited or restricted    Time: 1640-1720 PT Time Calculation (min) (ACUTE ONLY): 40 min   Charges:     PT Treatments $Therapeutic Activity: 8-22 mins   PT G Codes:   PT G-Codes **NOT FOR INPATIENT CLASS** Functional Limitation: Mobility: Walking and moving around Mobility: Walking and Moving Around Current Status (B7169): At least 40 percent but less than 60 percent impaired, limited or restricted Mobility: Walking and Moving Around Goal Status [phone]): At least 1 percent but less than 20 percent impaired, limited or restricted   Caryl Pina C. Nakeesha Bowler, PT, DPT [phone]  Gable Odonohue 11/05/2014, 5:48 PM

## 2014-11-05 NOTE — Discharge Instructions (Signed)
Blood Glucose Monitoring °Monitoring your blood glucose (also know as blood sugar) helps you to manage your diabetes. It also helps you and your health care provider monitor your diabetes and determine how well your treatment plan is working. °WHY SHOULD YOU MONITOR YOUR BLOOD GLUCOSE? °· It can help you understand how food, exercise, and medicine affect your blood glucose. °· It allows you to know what your blood glucose is at any given moment. You can quickly tell if you are having low blood glucose (hypoglycemia) or high blood glucose (hyperglycemia). °· It can help you and your health care provider know how to adjust your medicines. °· It can help you understand how to manage an illness or adjust medicine for exercise. °WHEN SHOULD YOU TEST? °Your health care provider will help you decide how often you should check your blood glucose. This may depend on the type of diabetes you have, your diabetes control, or the types of medicines you are taking. Be sure to write down all of your blood glucose readings so that this information can be reviewed with your health care provider. See below for examples of testing times that your health care provider may suggest. °Type 1 Diabetes °· Test 4 times a day if you are in good control, using an insulin pump, or perform multiple daily injections. °· If your diabetes is not well controlled or if you are sick, you may need to monitor more often. °· It is a good idea to also monitor: °· Before and after exercise. °· Between meals and 2 hours after a meal. °· Occasionally between 2:00 a.m. and 3:00 a.m. °Type 2 Diabetes °· It can vary with each person, but generally, if you are on insulin, test 4 times a day. °· If you take medicines by mouth (orally), test 2 times a day. °· If you are on a controlled diet, test once a day. °· If your diabetes is not well controlled or if you are sick, you may need to monitor more often. °HOW TO MONITOR YOUR BLOOD GLUCOSE °Supplies  Needed °· Blood glucose meter. °· Test strips for your meter. Each meter has its own strips. You must use the strips that go with your own meter. °· A pricking needle (lancet). °· A device that holds the lancet (lancing device). °· A journal or log book to write down your results. °Procedure °· Wash your hands with soap and water. Alcohol is not preferred. °· Prick the side of your finger (not the tip) with the lancet. °· Gently milk the finger until a small drop of blood appears. °· Follow the instructions that come with your meter for inserting the test strip, applying blood to the strip, and using your blood glucose meter. °Other Areas to Get Blood for Testing °Some meters allow you to use other areas of your body (other than your finger) to test your blood. These areas are called alternative sites. The most common alternative sites are: °· The forearm. °· The thigh. °· The back area of the lower leg. °· The palm of the hand. °The blood flow in these areas is slower. Therefore, the blood glucose values you get may be delayed, and the numbers are different from what you would get from your fingers. Do not use alternative sites if you think you are having hypoglycemia. Your reading will not be accurate. Always use a finger if you are having hypoglycemia. Also, if you cannot feel your lows (hypoglycemia unawareness), always use your fingers for your   blood glucose checks. ADDITIONAL TIPS FOR GLUCOSE MONITORING  Do not reuse lancets.  Always carry your supplies with you.  All blood glucose meters have a 24-hour "hotline" number to call if you have questions or need help.  Adjust (calibrate) your blood glucose meter with a control solution after finishing a few boxes of strips. BLOOD GLUCOSE RECORD KEEPING It is a good idea to keep a daily record or log of your blood glucose readings. Most glucose meters, if not all, keep your glucose records stored in the meter. Some meters come with the ability to download  your records to your home computer. Keeping a record of your blood glucose readings is especially helpful if you are wanting to look for patterns. Make notes to go along with the blood glucose readings because you might forget what happened at that exact time. Keeping good records helps you and your health care provider to work together to achieve good diabetes management.  Document Released: 03/24/2003 Document Revised: 08/05/2013 Document Reviewed: 08/13/2012 [facility] Patient Information 2015 [place], [place]. This information is not intended to replace advice given to you by your health care provider. Make sure you discuss any questions you have with your health care provider.  Hypoglycemia Hypoglycemia occurs when the glucose in your blood is too low. Glucose is a type of sugar that is your body's main energy source. Hormones, such as insulin and glucagon, control the level of glucose in the blood. Insulin lowers blood glucose and glucagon increases blood glucose. Having too much insulin in your blood stream, or not eating enough food containing sugar, can result in hypoglycemia. Hypoglycemia can happen to people with or without diabetes. It can develop quickly and can be a medical emergency.  CAUSES   Missing or delaying meals.  Not eating enough carbohydrates at meals.  Taking too much diabetes medicine.  Not timing your oral diabetes medicine or insulin doses with meals, snacks, and exercise.  Nausea and vomiting.  Certain medicines.  Severe illnesses, such as hepatitis, kidney disorders, and certain eating disorders.  Increased activity or exercise without eating something extra or adjusting medicines.  Drinking too much alcohol.  A nerve disorder that affects body functions like your heart rate, blood pressure, and digestion (autonomic neuropathy).  A condition where the stomach muscles do not function properly (gastroparesis). Therefore, medicines and food may not absorb  properly.  Rarely, a tumor of the pancreas can produce too much insulin. SYMPTOMS   Hunger.  Sweating (diaphoresis).  Change in body temperature.  Shakiness.  Headache.  Anxiety.  Lightheadedness.  Irritability.  Difficulty concentrating.  Dry mouth.  Tingling or numbness in the hands or feet.  Restless sleep or sleep disturbances.  Altered speech and coordination.  Change in mental status.  Seizures or prolonged convulsions.  Combativeness.  Drowsiness (lethargic).  Weakness.  Increased heart rate or palpitations.  Confusion.  Pale, gray skin color.  Blurred or double vision.  Fainting. DIAGNOSIS  A physical exam and medical history will be performed. Your caregiver may make a diagnosis based on your symptoms. Blood tests and other lab tests may be performed to confirm a diagnosis. Once the diagnosis is made, your caregiver will see if your signs and symptoms go away once your blood glucose is raised.  TREATMENT  Usually, you can easily treat your hypoglycemia when you notice symptoms.  Check your blood glucose. If it is less than 70 mg/dl, take one of the following:   3-4 glucose tablets.    cup  juice.    cup regular soda.   1 cup skim milk.   -1 tube of glucose gel.   5-6 hard candies.   Avoid high-fat drinks or food that may delay a rise in blood glucose levels.  Do not take more than the recommended amount of sugary foods, drinks, gel, or tablets. Doing so will cause your blood glucose to go too high.   Wait 10-15 minutes and recheck your blood glucose. If it is still less than 70 mg/dl or below your target range, repeat treatment.   Eat a snack if it is more than 1 hour until your next meal.  There may be a time when your blood glucose may go so low that you are unable to treat yourself at home when you start to notice symptoms. You may need someone to help you. You may even faint or be unable to swallow. If you cannot  treat yourself, someone will need to bring you to the hospital.  [place]  If you have diabetes, follow your diabetes management plan by:  Taking your medicines as directed.  Following your exercise plan.  Following your meal plan. Do not skip meals. Eat on time.  Testing your blood glucose regularly. Check your blood glucose before and after exercise. If you exercise longer or different than usual, be sure to check blood glucose more frequently.  Wearing your medical alert jewelry that says you have diabetes.  Identify the cause of your hypoglycemia. Then, develop ways to prevent the recurrence of hypoglycemia.  Do not take a hot bath or shower right after an insulin shot.  Always carry treatment with you. Glucose tablets are the easiest to carry.  If you are going to drink alcohol, drink it only with meals.  Tell friends or family members ways to keep you safe during a seizure. This may include removing hard or sharp objects from the area or turning you on your side.  Maintain a healthy weight. SEEK MEDICAL CARE IF:   You are having problems keeping your blood glucose in your target range.  You are having frequent episodes of hypoglycemia.  You feel you might be having side effects from your medicines.  You are not sure why your blood glucose is dropping so low.  You notice a change in vision or a new problem with your vision. SEEK IMMEDIATE MEDICAL CARE IF:   Confusion develops.  A change in mental status occurs.  The inability to swallow develops.  Fainting occurs. Document Released: 03/21/2005 Document Revised: 03/26/2013 Document Reviewed: 07/18/2011 [facility] Patient Information 2015 [place], [place]. This information is not intended to replace advice given to you by your health care provider. Make sure you discuss any questions you have with your health care provider.

## 2014-11-06 DIAGNOSIS — E11649 Type 2 diabetes mellitus with hypoglycemia without coma: Secondary | ICD-10-CM | POA: Diagnosis not present

## 2014-11-06 DIAGNOSIS — I10 Essential (primary) hypertension: Secondary | ICD-10-CM | POA: Diagnosis not present

## 2014-11-06 DIAGNOSIS — E785 Hyperlipidemia, unspecified: Secondary | ICD-10-CM | POA: Diagnosis not present

## 2014-11-06 DIAGNOSIS — N400 Enlarged prostate without lower urinary tract symptoms: Secondary | ICD-10-CM | POA: Diagnosis not present

## 2014-11-06 DIAGNOSIS — I739 Peripheral vascular disease, unspecified: Secondary | ICD-10-CM | POA: Diagnosis not present

## 2014-11-06 DIAGNOSIS — R55 Syncope and collapse: Secondary | ICD-10-CM | POA: Diagnosis not present

## 2014-11-06 DIAGNOSIS — R531 Weakness: Secondary | ICD-10-CM | POA: Diagnosis not present

## 2014-11-06 DIAGNOSIS — I63439 Cerebral infarction due to embolism of unspecified posterior cerebral artery: Secondary | ICD-10-CM | POA: Diagnosis not present

## 2014-11-06 DIAGNOSIS — I639 Cerebral infarction, unspecified: Secondary | ICD-10-CM | POA: Diagnosis not present

## 2014-11-06 DIAGNOSIS — R42 Dizziness and giddiness: Secondary | ICD-10-CM | POA: Diagnosis not present

## 2014-11-06 DIAGNOSIS — E162 Hypoglycemia, unspecified: Secondary | ICD-10-CM | POA: Diagnosis not present

## 2014-11-06 DIAGNOSIS — R270 Ataxia, unspecified: Secondary | ICD-10-CM | POA: Diagnosis not present

## 2014-11-06 LAB — LIPID PANEL
CHOLESTEROL: 135 mg/dL (ref 0–200)
HDL: 37 mg/dL — ABNORMAL LOW (ref >40)
LDL CALC: 76 mg/dL (ref 0–99)
Total CHOL/HDL Ratio: 3.6 RATIO
Triglycerides: 110 mg/dL (ref <150)
VLDL: 22 mg/dL (ref 0–40)

## 2014-11-06 LAB — CBC
HEMATOCRIT: 36 % — AB (ref 40.0–52.0)
Hemoglobin: 12.5 g/dL — ABNORMAL LOW (ref 13.0–18.0)
MCH: 31.5 pg (ref 26.0–34.0)
MCHC: 34.7 g/dL (ref 32.0–36.0)
MCV: 90.9 fL (ref 80.0–100.0)
Platelets: 176 10*3/uL (ref 150–440)
RBC: 3.96 MIL/uL — ABNORMAL LOW (ref 4.40–5.90)
RDW: 13.7 % (ref 11.5–14.5)
WBC: 6.8 10*3/uL (ref 3.8–10.6)

## 2014-11-06 LAB — BASIC METABOLIC PANEL
ANION GAP: 6 (ref 5–15)
BUN: 21 mg/dL — ABNORMAL HIGH (ref 6–20)
CO2: 26 mmol/L (ref 22–32)
Calcium: 9.5 mg/dL (ref 8.9–10.3)
Chloride: 108 mmol/L (ref 101–111)
Creatinine, Ser: 0.96 mg/dL (ref 0.61–1.24)
GFR calc non Af Amer: >60 mL/min (ref >60)
Glucose, Bld: 106 mg/dL — ABNORMAL HIGH (ref 65–99)
POTASSIUM: 3.8 mmol/L (ref 3.5–5.1)
SODIUM: 140 mmol/L (ref 135–145)

## 2014-11-06 LAB — GLUCOSE, CAPILLARY
GLUCOSE-CAPILLARY: 136 mg/dL — AB (ref 65–99)
GLUCOSE-CAPILLARY: 244 mg/dL — AB (ref 65–99)

## 2014-11-06 MED ORDER — AMLODIPINE BESYLATE 5 MG PO TABS: 5.0000 mg | ORAL_TABLET | Freq: Every day | ORAL | Status: DC

## 2014-11-06 MED ORDER — ASPIRIN 81 MG PO TBEC: 81.0000 mg | DELAYED_RELEASE_TABLET | Freq: Every day | ORAL | Status: DC

## 2014-11-06 NOTE — Plan of Care (Signed)
Problem: Phase I Progression Outcomes Goal: Pain controlled with appropriate interventions Outcome: Completed/Met Date Met:  11/06/14 Pt denies pain at present time. Goal: OOB as tolerated unless otherwise ordered Outcome: Progressing Pt stood to side of bed to void. Goal: Voiding-avoid urinary catheter unless indicated Outcome: Progressing Pt incontinent of urine at times, peri care prn.

## 2014-11-06 NOTE — Progress Notes (Signed)
Physical Therapy Treatment Patient Details Name: Matthew Bruce MRN: [id number] DOB: 05-Dec-1926 Today's Date: 2014/11/22    History of Present Illness pt admitted due to unsteadiness likely due to hypoglycemia. pt glucose now within PT treatable levels. pt reports he is feeling much better. pt had been unsteady per H & P x 3 weeks. there was remote right sided cerebellar CVA.     PT Comments    Ambulated approx 160x1 with RW, cues for maintaining inside walker and in center of RW as RW would tend to drift to L.  Cues for increasing BOS as ankles almost touching. Steady step through gait with slow cadence.  Pt demonstrated mod I transfers sit<>stand and no cues for bed mobility.   Follow Up Recommendations  Outpatient PT     Equipment Recommendations  Rolling walker with 5" wheels    Recommendations for Other Services       Precautions / Restrictions      Mobility  Bed Mobility Overal bed mobility: Modified Independent                Transfers Overall transfer level: Modified independent Equipment used: Rolling walker (2 wheeled)             General transfer comment: required rocking momentum for sit to stand from lower position of bed  Ambulation/Gait Ambulation/Gait assistance: Min assist Ambulation Distance (Feet): 160 Feet Assistive device: Rolling walker (2 wheeled) Gait Pattern/deviations: Step-through pattern;Drifts right/left;Narrow base of support     General Gait Details: pt needed cueing for maintaining center of RW and increasing BOS, noted drifting to L   Stairs            Wheelchair Mobility    Modified Rankin (Stroke Patients Only)       Balance Overall balance assessment: Modified Independent                                  Cognition   Behavior During Therapy: [facility] for tasks assessed/performed                        Exercises General Exercises - Lower Extremity Long Arc Quad:  Strengthening;Both;15 reps;Seated Hip Flexion/Marching: Strengthening;Both;15 reps;Seated    General Comments        Pertinent Vitals/Pain Pain Assessment: No/denies pain    Home Living                      Prior Function            PT Goals (current goals can now be found in the care plan section) Acute Rehab PT Goals Patient Stated Goal: go home PT Goal Formulation: With patient Time For Goal Achievement: 11/12/14 Potential to Achieve Goals: Good    Frequency  Min 2X/week    PT Plan      Co-evaluation             End of Session Equipment Utilized During Treatment: Gait belt Activity Tolerance: Patient tolerated treatment well Patient left: in chair;with call bell/phone within reach;with chair alarm set;with family/visitor present     Time: 9735-3299 PT Time Calculation (min) (ACUTE ONLY): 17 min  Charges:  $Gait Training: 8-22 mins                    G Codes:      Shawnya Mayor 11-22-14, 12:18 PM  Embree Brawley  Murice Barbar, PTA

## 2014-11-06 NOTE — Progress Notes (Signed)
   11/06/14 1231  Clinical Encounter Type  Visited With Patient and family together  Visit Type Initial  Consult/Referral To Chaplain  Spiritual Encounters  Spiritual Needs Emotional  Stress Factors  Patient Stress Factors None identified  Family Stress Factors None identified  Chaplain rounded in unit offered a compassionate presence and spiritual support as applicable. Patient was in the process of being released. Chaplain Dynisha Due A. Anessia [place] Ext. [phone]

## 2014-11-06 NOTE — Consult Note (Signed)
CC: ataxic gait.   HPI: Matthew Bruce is an 81 y.o. male with a known history of essential hypertension type 2 diabetes uncomplicated presenting with weakness. He is been complaining of generalized weakness with associated dizziness for approximately 3 weeks in total duration. Glucose within normal limits. Pt has R cerebellar stroke and falling to R side.   Past Medical History  Diagnosis Date  . Diabetes mellitus without complication   . Hypertension     Past Surgical History  Procedure Laterality Date  . Valve replacement    . Cardiac surgery      Family History  Problem Relation Age of Onset  . Heart failure Neg Hx     Social History:  reports that he has quit smoking. His smoking use included Cigarettes. His smokeless tobacco use includes Chew. He reports that he does not drink alcohol. His drug history is not on file.  No Known Allergies  Medications: I have reviewed the patient's current medications.  ROS: History obtained from chart review  General ROS: negative for - chills, fatigue, fever, night sweats, weight gain or weight loss Psychological ROS: negative for - behavioral disorder, hallucinations, memory difficulties, mood swings or suicidal ideation Ophthalmic ROS: negative for - blurry vision, double vision, eye pain or loss of vision ENT ROS: negative for - epistaxis, nasal discharge, oral lesions, sore throat, tinnitus or vertigo Allergy and Immunology ROS: negative for - hives or itchy/watery eyes Hematological and Lymphatic ROS: negative for - bleeding problems, bruising or swollen lymph nodes Endocrine ROS: negative for - galactorrhea, hair pattern changes, polydipsia/polyuria or temperature intolerance Respiratory ROS: negative for - cough, hemoptysis, shortness of breath or wheezing Cardiovascular ROS: negative for - chest pain, dyspnea on exertion, edema or irregular heartbeat Gastrointestinal ROS: negative for - abdominal pain, diarrhea,  hematemesis, nausea/vomiting or stool incontinence Genito-Urinary ROS: negative for - dysuria, hematuria, incontinence or urinary frequency/urgency Musculoskeletal ROS: negative for - joint swelling or muscular weakness Neurological ROS: as noted in HPI Dermatological ROS: negative for rash and skin lesion changes  Physical Examination: Blood pressure 154/69, pulse 71, temperature 98.3 F (36.8 C), temperature source Oral, resp. rate 20, height 6' (1.829 m), weight 91.627 kg (202 lb), SpO2 97 %.    Neurological Examination Mental Status: Alert, oriented, thought content appropriate.  Speech fluent without evidence of aphasia.  Able to follow 3 step commands without difficulty. Cranial Nerves: II: Discs flat bilaterally; Visual fields grossly normal, pupils equal, round, reactive to light and accommodation III,IV, VI: ptosis not present, extra-ocular motions intact bilaterally V,VII: smile symmetric, facial light touch sensation normal bilaterally VIII: hearing normal bilaterally IX,X: gag reflex present XI: bilateral shoulder shrug XII: midline tongue extension Motor: Right : Upper extremity   5/5    Left:     Upper extremity   5/5  Lower extremity   5/5     Lower extremity   5/5 Tone and bulk:normal tone throughout; no atrophy noted Sensory: Pinprick and light touch intact throughout, bilaterally Deep Tendon Reflexes: 1+ and symmetric throughout Plantars: Right: downgoing   Left: downgoing Cerebellar: Slight dysmetria on R side  Gait: not tested.     Laboratory Studies:   Basic Metabolic Panel:  Recent Labs Lab 11/04/14 1911 11/06/14 0343  NA 139 140  K 4.4 3.8  CL 107 108  CO2 22 26  GLUCOSE 64* 106*  BUN 17 21*  CREATININE 1.01 0.96  CALCIUM 9.5 9.5    Liver Function Tests: No results for input(s):  AST, ALT, ALKPHOS, BILITOT, PROT, ALBUMIN in the last 168 hours. No results for input(s): LIPASE, AMYLASE in the last 168 hours. No results for input(s): AMMONIA  in the last 168 hours.  CBC:  Recent Labs Lab 11/04/14 1911 11/06/14 0343  WBC 7.1 6.8  HGB 13.2 12.5*  HCT 37.8* 36.0*  MCV 91.2 90.9  PLT 248 176    Cardiac Enzymes: No results for input(s): CKTOTAL, CKMB, CKMBINDEX, TROPONINI in the last 168 hours.  BNP: Invalid input(s): POCBNP  CBG:  Recent Labs Lab 11/05/14 1509 11/05/14 1731 11/05/14 2121 11/06/14 0804 11/06/14 1115  GLUCAP 205* 233* 191* 136* 244*    Microbiology: No results found for this or any previous visit.  Coagulation Studies: No results for input(s): LABPROT, INR in the last 72 hours.  Urinalysis:  Recent Labs Lab 11/04/14 2240  COLORURINE YELLOW*  LABSPEC 1.015  PHURINE 5.0  GLUCOSEU NEGATIVE  HGBUR NEGATIVE  BILIRUBINUR NEGATIVE  KETONESUR NEGATIVE  PROTEINUR 100*  NITRITE NEGATIVE  LEUKOCYTESUR NEGATIVE    Lipid Panel:     Component Value Date/Time   CHOL 135 11/06/2014 0343   TRIG 110 11/06/2014 0343   HDL 37* 11/06/2014 0343   CHOLHDL 3.6 11/06/2014 0343   VLDL 22 11/06/2014 0343   LDLCALC 76 11/06/2014 0343    HgbA1C:  Lab Results  Component Value Date   HGBA1C 6.3* 11/04/2014    Urine Drug Screen:  No results found for: LABOPIA, COCAINSCRNUR, LABBENZ, AMPHETMU, THCU, LABBARB  Alcohol Level: No results for input(s): ETH in the last 168 hours.  Other results: EKG: normal EKG, normal sinus rhythm, unchanged from previous tracings.  Imaging: Ct Head Wo Contrast  11/04/2014   CLINICAL DATA:  Subacute onset of generalized leg weakness and dizziness. Recent fall. Initial encounter.  EXAM: CT HEAD WITHOUT CONTRAST  TECHNIQUE: Contiguous axial images were obtained from the base of the skull through the vertex without intravenous contrast.  COMPARISON:  None.  FINDINGS: There is no evidence of acute infarction, mass lesion, or intra- or extra-axial hemorrhage on CT.  Prominence of the ventricles and sulci reflects moderate cortical volume loss. Cerebellar atrophy is noted.  Scattered periventricular and subcortical white matter change likely reflects small vessel ischemic microangiopathy. A few small chronic lacunar infarcts are seen at the right cerebellar hemisphere.  The brainstem and fourth ventricle are within normal limits. The basal ganglia are unremarkable in appearance. The cerebral hemispheres demonstrate grossly normal gray-white differentiation. No mass effect or midline shift is seen.  There is no evidence of fracture; visualized osseous structures are unremarkable in appearance. The visualized portions of the orbits are within normal limits. There is partial opacification of the ethmoid air cells. There is mild partial opacification of the left mastoid air cells. The paranasal sinuses and right mastoid air cells are well-aerated. No significant soft tissue abnormalities are seen.  IMPRESSION: 1. No acute intracranial pathology seen on CT. 2. Moderate cortical volume loss and scattered small vessel ischemic microangiopathy. Few small chronic lacunar infarcts at the right cerebellar hemisphere. 3. Mild partial opacification of the left mastoid air cells.   Electronically Signed   By: Garald Balding M.D.   On: 11/04/2014 23:20   US Carotid Bilateral  11/05/2014   CLINICAL DATA:  Stroke symptoms, syncope, visual disturbance and diabetes  EXAM: BILATERAL CAROTID DUPLEX ULTRASOUND  TECHNIQUE: Pearline Cables scale imaging, color Doppler and duplex ultrasound were performed of bilateral carotid and vertebral arteries in the neck.  COMPARISON:  None.  FINDINGS: Criteria:  Quantification of carotid stenosis is based on velocity parameters that correlate the residual internal carotid diameter with NASCET-based stenosis levels, using the diameter of the distal internal carotid lumen as the denominator for stenosis measurement.  The following velocity measurements were obtained:  RIGHT  ICA:  50/12 cm/sec  CCA:  01/7 cm/sec  SYSTOLIC ICA/CCA RATIO:  0.8  DIASTOLIC ICA/CCA RATIO:  2.6  ECA:  79  cm/sec  LEFT  ICA:  76/16 cm/sec  CCA:  79/3 cm/sec  SYSTOLIC ICA/CCA RATIO:  1.3  DIASTOLIC ICA/CCA RATIO:  1.8  ECA:  80 cm/sec  RIGHT CAROTID ARTERY: Minor echogenic shadowing plaque formation. No hemodynamically significant right ICA stenosis, velocity elevation, or turbulent flow. Degree of narrowing less than 50%.  RIGHT VERTEBRAL ARTERY:  Antegrade  LEFT CAROTID ARTERY: Similar scattered minor echogenic plaque formation. No hemodynamically significant left ICA stenosis, velocity elevation, or turbulent flow.  LEFT VERTEBRAL ARTERY:  Antegrade  IMPRESSION: Minor carotid atherosclerosis. No hemodynamically significant ICA stenosis by ultrasound. Degree of narrowing less than 50% bilaterally.   Electronically Signed   By: Jerilynn Mages.  Shick M.D.   On: 11/05/2014 17:40     Assessment/Plan:  81 y.o. male with a known history of essential hypertension type 2 diabetes uncomplicated presenting with weakness. He is been complaining of generalized weakness with associated dizziness for approximately 3 weeks in total duration. Glucose within normal limits. Pt has R cerebellar stroke and falling to R side.   Likely ataxia from R cerebellar stroke.  Con't ASA  Pt has follow up appointment in few weeks with neurology.  Leotis Pain   11/06/2014, 1:22 PM

## 2014-11-06 NOTE — Care Management Note (Signed)
Case Management Note  Patient Details  Name: Matthew Bruce MRN: [id number] Date of Birth: 1926-07-10  Subjective/Objective:                   Met with patient, wife, and his daughter to discuss discharge planning. Patient states he "feels better" and "hopes to go home today". All deny need for home health PT.     Action/Plan: I left my contact card with daughter and a list of home health agencies. They are aware that they will need to contact PCP for home health orders after discharge but that I could assist them if necessary. No further RNCM needs. Case closed.   Expected Discharge Date:                  Expected Discharge Plan:     In-House Referral:     Discharge planning Services  CM Consult  Post Acute Care Choice:    Choice offered to:  Patient, Spouse, Adult Children  DME Arranged:  N/A DME Agency:     HH Arranged:  NA HH Agency:     Status of Service:  Completed, signed off  Medicare Important Message Given:    Date Medicare IM Given:    Medicare IM give by:    Date Additional Medicare IM Given:    Additional Medicare Important Message give by:     If discussed at [place] of Stay Meetings, dates discussed:    Additional Comments:  Marshell Garfinkel, RN 11/06/2014, 10:18 AM

## 2014-11-10 DIAGNOSIS — I63441 Cerebral infarction due to embolism of right cerebellar artery: Secondary | ICD-10-CM | POA: Diagnosis present

## 2014-11-10 NOTE — Discharge Summary (Signed)
Matthew Bruce at [facility]   PATIENT NAME: Matthew Bruce    MR#:  [id number]  DATE OF BIRTH:  [birth year]  DATE OF ADMISSION:  11/04/2014 ADMITTING PHYSICIAN: Lytle Butte, MD  DATE OF DISCHARGE: 11/06/2014  PRIMARY CARE PHYSICIAN: Emmaline Kluver, MD    ADMISSION DIAGNOSIS:  Hypoglycemia [E16.2] Generalized weakness [R53.1]  DISCHARGE DIAGNOSIS:  Principal Problem:   Hypoglycemia Active Problems:   Embolic stroke involving right cerebellar artery   SECONDARY DIAGNOSIS:   Past Medical History  Diagnosis Date  . Diabetes mellitus without complication   . Hypertension     HOSPITAL COURSE:   #1 hypoglycemia: Initial blood sugars low in the 60s and it was difficult to bring these up even with D50 and D5. This is likely due to sulfonylurea. His hemoglobin A1c is 6.3, this is low for this age group where the goal would be to be under 8.0. I have discontinued his sulfonylurea. He can continue on metformin only. This can be followed by his primary care physician in outpatient setting.  #2 remote right sided cerebellar CVA: This is likely the cause of his one-month history of dizziness. 2-D echocardiogram and carotid Dopplers are normal. He was seen by neurology. He will start aspirin therapy. He did see physical therapy and will have home health physical therapy after discharge.  #3 hypertension: Blood pressure slightly elevated at this time. Appropriate for age. Continue with home verapamil  #4 hyperlipidemia: Continue statin therapy  #5 BPH: Continue Flomax and Proscar.  DISCHARGE CONDITIONS:   Stable  CONSULTS OBTAINED:  Treatment Team:  Leotis Pain, MD  DRUG ALLERGIES:  No Known Allergies  DISCHARGE MEDICATIONS:   Discharge Medication List as of 11/06/2014  1:10 PM    START taking these medications   Details  amLODipine (NORVASC) 5 MG tablet Take 1 tablet (5 mg total) by mouth daily., Starting  11/06/2014, Until Discontinued, Normal    aspirin EC 81 MG EC tablet Take 1 tablet (81 mg total) by mouth daily., Starting 11/06/2014, Until Discontinued, Normal      CONTINUE these medications which have NOT CHANGED   Details  Cholecalciferol 1000 UNITS capsule Take 1,000 Units by mouth daily., Until Discontinued, Historical Med    finasteride (PROSCAR) 5 MG tablet Take 5 mg by mouth daily., Until Discontinued, Historical Med    ibuprofen (ADVIL,MOTRIN) 800 MG tablet Take 800 mg by mouth every 8 (eight) hours as needed., Until Discontinued, Historical Med    latanoprost (XALATAN) 0.005 % ophthalmic solution Place 1 drop into both eyes at bedtime., Until Discontinued, Historical Med    lovastatin (ALTOPREV) 40 MG 24 hr tablet Take 40 mg by mouth at bedtime., Until Discontinued, Historical Med    meclizine (ANTIVERT) 12.5 MG tablet Take 12.5 mg by mouth at bedtime., Until Discontinued, Historical Med    metFORMIN (GLUCOPHAGE) 1000 MG tablet Take 1,000 mg by mouth 2 (two) times daily with a meal., Until Discontinued, Historical Med    Multiple Vitamin (MULTIVITAMIN WITH MINERALS) TABS tablet Take 1 tablet by mouth daily., Until Discontinued, Historical Med    tamsulosin (FLOMAX) 0.4 MG CAPS capsule Take 0.4 mg by mouth daily., Until Discontinued, Historical Med    verapamil (CALAN-SR) 180 MG CR tablet Take 180 mg by mouth at bedtime., Until Discontinued, Historical Med         DISCHARGE INSTRUCTIONS:   See AVS   Today   CHIEF COMPLAINT:   Chief Complaint  Patient  presents with  . Weakness    HISTORY OF PRESENT ILLNESS:  Matthew Bruce is a 81 y.o. male with a known history of essential hypertension type 2 diabetes uncomplicated presenting with weakness. He is been complaining of generalized weakness with associated dizziness for approximately 3 weeks in total duration. Upon presentation he was noted to be hypoglycemic after some improvement in his sugar symptoms improved  however, he became more hypoglycemic as the night went on requiring fluids.  VITAL SIGNS:  Blood pressure 154/69, pulse 71, temperature 98.3 F (36.8 C), temperature source Oral, resp. rate 20, height 6' (1.829 m), weight 91.627 kg (202 lb), SpO2 97 %.  I/O:  No intake or output data in the 24 hours ending 11/10/14 1425  PHYSICAL EXAMINATION:  GENERAL:  81 y.o.-year-old patient lying in the bed with no acute distress.  LUNGS: Normal breath sounds bilaterally, no wheezing, rales,rhonchi or crepitation. No use of accessory muscles of respiration.  CARDIOVASCULAR: S1, S2 normal. No murmurs, rubs, or gallops.  ABDOMEN: Soft, non-tender, non-distended. Bowel sounds present. No organomegaly or mass.  EXTREMITIES: No pedal edema, cyanosis, or clubbing.  NEUROLOGIC: Cranial nerves II through XII are intact. Muscle strength 5/5 in all extremities. Sensation intact. Gait not checked. Her port ataxia and falls  PSYCHIATRIC: The patient is alert and oriented x 3.  SKIN: No obvious rash, lesion, or ulcer.   DATA REVIEW:   CBC  Recent Labs Lab 11/06/14 0343  WBC 6.8  HGB 12.5*  HCT 36.0*  PLT 176    Chemistries   Recent Labs Lab 11/06/14 0343  NA 140  K 3.8  CL 108  CO2 26  GLUCOSE 106*  BUN 21*  CREATININE 0.96  CALCIUM 9.5    Cardiac Enzymes No results for input(s): TROPONINI in the last 168 hours.  Microbiology Results  No results found for this or any previous visit.  RADIOLOGY:  No results found.  EKG:   Orders placed or performed during the hospital encounter of 11/04/14  . ED EKG  . ED EKG  . EKG 12-Lead  . EKG 12-Lead  . EKG      Management plans discussed with the patient, family and they are in agreement.  CODE STATUS: Full  TOTAL TIME TAKING CARE OF THIS PATIENT: 40 minutes.  Greater than 50% of time spent in care coordination and counseling.  Myrtis Ser M.D on 11/10/2014 at 2:25 PM  Between 7am to 6pm - Pager - [phone]  After 6pm  go to [email or web address] - password [email or web address]  Office  [phone]  CC: Primary care physician; Emmaline Kluver, MD

## 2014-11-17 DIAGNOSIS — H6123 Impacted cerumen, bilateral: Secondary | ICD-10-CM | POA: Diagnosis not present

## 2014-11-17 DIAGNOSIS — R42 Dizziness and giddiness: Secondary | ICD-10-CM | POA: Diagnosis not present

## 2014-12-05 DIAGNOSIS — E162 Hypoglycemia, unspecified: Secondary | ICD-10-CM | POA: Diagnosis not present

## 2014-12-12 DIAGNOSIS — E1165 Type 2 diabetes mellitus with hyperglycemia: Secondary | ICD-10-CM | POA: Diagnosis not present

## 2014-12-19 DIAGNOSIS — R42 Dizziness and giddiness: Secondary | ICD-10-CM | POA: Diagnosis not present

## 2014-12-19 DIAGNOSIS — I10 Essential (primary) hypertension: Secondary | ICD-10-CM | POA: Diagnosis not present

## 2014-12-19 DIAGNOSIS — Z0001 Encounter for general adult medical examination with abnormal findings: Secondary | ICD-10-CM | POA: Diagnosis not present

## 2014-12-19 DIAGNOSIS — I2510 Atherosclerotic heart disease of native coronary artery without angina pectoris: Secondary | ICD-10-CM | POA: Diagnosis not present

## 2014-12-19 DIAGNOSIS — D539 Nutritional anemia, unspecified: Secondary | ICD-10-CM | POA: Diagnosis not present

## 2014-12-19 DIAGNOSIS — E1165 Type 2 diabetes mellitus with hyperglycemia: Secondary | ICD-10-CM | POA: Diagnosis not present

## 2014-12-19 DIAGNOSIS — I6310 Cerebral infarction due to embolism of unspecified precerebral artery: Secondary | ICD-10-CM | POA: Diagnosis not present

## 2014-12-29 DIAGNOSIS — H5210 Myopia, unspecified eye: Secondary | ICD-10-CM | POA: Diagnosis not present

## 2014-12-29 DIAGNOSIS — H4011X3 Primary open-angle glaucoma, severe stage: Secondary | ICD-10-CM | POA: Diagnosis not present

## 2015-01-12 DIAGNOSIS — Z23 Encounter for immunization: Secondary | ICD-10-CM | POA: Diagnosis not present

## 2015-03-12 DIAGNOSIS — E1165 Type 2 diabetes mellitus with hyperglycemia: Secondary | ICD-10-CM | POA: Diagnosis not present

## 2015-03-19 DIAGNOSIS — I6310 Cerebral infarction due to embolism of unspecified precerebral artery: Secondary | ICD-10-CM | POA: Diagnosis not present

## 2015-03-19 DIAGNOSIS — I2510 Atherosclerotic heart disease of native coronary artery without angina pectoris: Secondary | ICD-10-CM | POA: Diagnosis not present

## 2015-03-19 DIAGNOSIS — E1165 Type 2 diabetes mellitus with hyperglycemia: Secondary | ICD-10-CM | POA: Diagnosis not present

## 2015-04-20 DIAGNOSIS — H5210 Myopia, unspecified eye: Secondary | ICD-10-CM | POA: Diagnosis not present

## 2015-04-20 DIAGNOSIS — H524 Presbyopia: Secondary | ICD-10-CM | POA: Diagnosis not present

## 2015-04-20 DIAGNOSIS — H401132 Primary open-angle glaucoma, bilateral, moderate stage: Secondary | ICD-10-CM | POA: Diagnosis not present

## 2015-06-11 DIAGNOSIS — E1165 Type 2 diabetes mellitus with hyperglycemia: Secondary | ICD-10-CM | POA: Diagnosis not present

## 2015-06-26 DIAGNOSIS — I10 Essential (primary) hypertension: Secondary | ICD-10-CM | POA: Diagnosis not present

## 2015-06-26 DIAGNOSIS — I2510 Atherosclerotic heart disease of native coronary artery without angina pectoris: Secondary | ICD-10-CM | POA: Diagnosis not present

## 2015-06-26 DIAGNOSIS — I6310 Cerebral infarction due to embolism of unspecified precerebral artery: Secondary | ICD-10-CM | POA: Diagnosis not present

## 2015-06-26 DIAGNOSIS — E1165 Type 2 diabetes mellitus with hyperglycemia: Secondary | ICD-10-CM | POA: Diagnosis not present

## 2015-09-06 DIAGNOSIS — R739 Hyperglycemia, unspecified: Secondary | ICD-10-CM | POA: Diagnosis not present

## 2015-09-06 DIAGNOSIS — R42 Dizziness and giddiness: Secondary | ICD-10-CM | POA: Diagnosis not present

## 2015-09-11 DIAGNOSIS — E1165 Type 2 diabetes mellitus with hyperglycemia: Secondary | ICD-10-CM | POA: Diagnosis not present

## 2015-09-18 DIAGNOSIS — I10 Essential (primary) hypertension: Secondary | ICD-10-CM | POA: Diagnosis not present

## 2015-09-18 DIAGNOSIS — E1165 Type 2 diabetes mellitus with hyperglycemia: Secondary | ICD-10-CM | POA: Diagnosis not present

## 2015-09-18 DIAGNOSIS — I2510 Atherosclerotic heart disease of native coronary artery without angina pectoris: Secondary | ICD-10-CM | POA: Diagnosis not present

## 2015-10-01 DIAGNOSIS — H905 Unspecified sensorineural hearing loss: Secondary | ICD-10-CM | POA: Diagnosis not present

## 2015-10-09 DIAGNOSIS — L03031 Cellulitis of right toe: Secondary | ICD-10-CM | POA: Diagnosis not present

## 2015-10-10 DIAGNOSIS — E1165 Type 2 diabetes mellitus with hyperglycemia: Secondary | ICD-10-CM | POA: Diagnosis not present

## 2015-10-10 DIAGNOSIS — R42 Dizziness and giddiness: Secondary | ICD-10-CM | POA: Diagnosis not present

## 2015-10-10 DIAGNOSIS — L03031 Cellulitis of right toe: Secondary | ICD-10-CM | POA: Diagnosis not present

## 2015-10-12 DIAGNOSIS — B351 Tinea unguium: Secondary | ICD-10-CM | POA: Diagnosis not present

## 2015-10-12 DIAGNOSIS — L97521 Non-pressure chronic ulcer of other part of left foot limited to breakdown of skin: Secondary | ICD-10-CM | POA: Diagnosis not present

## 2015-10-12 DIAGNOSIS — E1142 Type 2 diabetes mellitus with diabetic polyneuropathy: Secondary | ICD-10-CM | POA: Diagnosis not present

## 2015-10-12 DIAGNOSIS — L02619 Cutaneous abscess of unspecified foot: Secondary | ICD-10-CM | POA: Diagnosis not present

## 2015-10-12 DIAGNOSIS — L03119 Cellulitis of unspecified part of limb: Secondary | ICD-10-CM | POA: Diagnosis not present

## 2015-10-20 DIAGNOSIS — E1142 Type 2 diabetes mellitus with diabetic polyneuropathy: Secondary | ICD-10-CM | POA: Diagnosis not present

## 2015-10-20 DIAGNOSIS — L97521 Non-pressure chronic ulcer of other part of left foot limited to breakdown of skin: Secondary | ICD-10-CM | POA: Diagnosis not present

## 2015-11-19 ENCOUNTER — Encounter: Payer: Self-pay | Admitting: Emergency Medicine

## 2015-11-19 ENCOUNTER — Emergency Department: Payer: Commercial Managed Care - HMO

## 2015-11-19 ENCOUNTER — Emergency Department
Admission: EM | Admit: 2015-11-19 | Discharge: 2015-11-19 | Disposition: A | Discharge status: Home / Self Care | Payer: Commercial Managed Care - HMO | Attending: Emergency Medicine | Admitting: Emergency Medicine

## 2015-11-19 DIAGNOSIS — I959 Hypotension, unspecified: Secondary | ICD-10-CM | POA: Diagnosis not present

## 2015-11-19 DIAGNOSIS — R4182 Altered mental status, unspecified: Secondary | ICD-10-CM | POA: Diagnosis not present

## 2015-11-19 DIAGNOSIS — R001 Bradycardia, unspecified: Secondary | ICD-10-CM | POA: Diagnosis not present

## 2015-11-19 DIAGNOSIS — R410 Disorientation, unspecified: Secondary | ICD-10-CM | POA: Diagnosis not present

## 2015-11-19 DIAGNOSIS — J189 Pneumonia, unspecified organism: Secondary | ICD-10-CM | POA: Diagnosis not present

## 2015-11-19 DIAGNOSIS — Z7984 Long term (current) use of oral hypoglycemic drugs: Secondary | ICD-10-CM | POA: Diagnosis not present

## 2015-11-19 DIAGNOSIS — E119 Type 2 diabetes mellitus without complications: Secondary | ICD-10-CM | POA: Diagnosis not present

## 2015-11-19 DIAGNOSIS — Z7982 Long term (current) use of aspirin: Secondary | ICD-10-CM | POA: Diagnosis not present

## 2015-11-19 DIAGNOSIS — Z791 Long term (current) use of non-steroidal anti-inflammatories (NSAID): Secondary | ICD-10-CM | POA: Diagnosis not present

## 2015-11-19 DIAGNOSIS — Z87891 Personal history of nicotine dependence: Secondary | ICD-10-CM | POA: Diagnosis not present

## 2015-11-19 DIAGNOSIS — Z79899 Other long term (current) drug therapy: Secondary | ICD-10-CM | POA: Insufficient documentation

## 2015-11-19 DIAGNOSIS — I10 Essential (primary) hypertension: Secondary | ICD-10-CM | POA: Diagnosis not present

## 2015-11-19 DIAGNOSIS — F17220 Nicotine dependence, chewing tobacco, uncomplicated: Secondary | ICD-10-CM | POA: Diagnosis not present

## 2015-11-19 LAB — TROPONIN I

## 2015-11-19 LAB — CBC WITH DIFFERENTIAL/PLATELET
BASOS ABS: 0.1 10*3/uL (ref 0–0.1)
Basophils Relative: 1 %
EOS ABS: 0.5 10*3/uL (ref 0–0.7)
Eosinophils Relative: 7 %
HCT: 36.3 % — ABNORMAL LOW (ref 40.0–52.0)
Hemoglobin: 12.8 g/dL — ABNORMAL LOW (ref 13.0–18.0)
LYMPHS ABS: 1.5 10*3/uL (ref 1.0–3.6)
Lymphocytes Relative: 20 %
MCH: 31.3 pg (ref 26.0–34.0)
MCHC: 35.2 g/dL (ref 32.0–36.0)
MCV: 89.1 fL (ref 80.0–100.0)
Monocytes Absolute: 0.5 10*3/uL (ref 0.2–1.0)
Monocytes Relative: 7 %
Neutro Abs: 5.1 10*3/uL (ref 1.4–6.5)
Neutrophils Relative %: 65 %
Platelets: 185 10*3/uL (ref 150–440)
RBC: 4.08 MIL/uL — AB (ref 4.40–5.90)
RDW: 13.8 % (ref 11.5–14.5)
WBC: 7.7 10*3/uL (ref 3.8–10.6)

## 2015-11-19 LAB — URINALYSIS COMPLETE WITH MICROSCOPIC (ARMC ONLY)
BACTERIA UA: NONE SEEN
Bilirubin Urine: NEGATIVE
GLUCOSE, UA: NEGATIVE mg/dL
HGB URINE DIPSTICK: NEGATIVE
Ketones, ur: NEGATIVE mg/dL
LEUKOCYTES UA: NEGATIVE
NITRITE: NEGATIVE
PH: 7 (ref 5.0–8.0)
PROTEIN: 30 mg/dL — AB
Specific Gravity, Urine: 1.006 (ref 1.005–1.030)
Squamous Epithelial / LPF: NONE SEEN

## 2015-11-19 LAB — COMPREHENSIVE METABOLIC PANEL
ALT: 17 U/L (ref 17–63)
AST: 21 U/L (ref 15–41)
Albumin: 3.6 g/dL (ref 3.5–5.0)
Alkaline Phosphatase: 57 U/L (ref 38–126)
Anion gap: 7 (ref 5–15)
BUN: 24 mg/dL — ABNORMAL HIGH (ref 6–20)
CO2: 23 mmol/L (ref 22–32)
CREATININE: 1.29 mg/dL — AB (ref 0.61–1.24)
Calcium: 9.5 mg/dL (ref 8.9–10.3)
Chloride: 108 mmol/L (ref 101–111)
GFR calc non Af Amer: 48 mL/min — ABNORMAL LOW (ref >60)
GFR, EST AFRICAN AMERICAN: 56 mL/min — AB (ref >60)
Glucose, Bld: 119 mg/dL — ABNORMAL HIGH (ref 65–99)
Potassium: 5 mmol/L (ref 3.5–5.1)
SODIUM: 138 mmol/L (ref 135–145)
Total Bilirubin: 0.4 mg/dL (ref 0.3–1.2)
Total Protein: 6.1 g/dL — ABNORMAL LOW (ref 6.5–8.1)

## 2015-11-19 MED ORDER — AZITHROMYCIN 500 MG PO TABS
500.0000 mg | ORAL_TABLET | Freq: Once | ORAL | Status: AC
  Administered 2015-11-19: 500 mg via ORAL
  Filled 2015-11-19: qty 1

## 2015-11-19 MED ORDER — SODIUM CHLORIDE 0.9 % IV BOLUS (SEPSIS)
1000.0000 mL | Freq: Once | INTRAVENOUS | Status: AC
  Administered 2015-11-19: 1000 mL via INTRAVENOUS

## 2015-11-19 MED ORDER — AZITHROMYCIN 250 MG PO TABS: ORAL_TABLET | ORAL | 0 refills | Status: AC

## 2015-11-19 NOTE — ED Notes (Signed)
Pt unable to urinate at this time to provide urine specimen.

## 2015-11-19 NOTE — ED Notes (Signed)
Patient transported to X-ray, to CT after.

## 2015-11-19 NOTE — ED Provider Notes (Signed)
[facility] Emergency Department Provider Note   ____________________________________________   First MD Initiated Contact with Patient 11/19/15 1110     (approximate)  I have reviewed the triage vital signs and the nursing notes.   HISTORY  Chief Complaint Altered Mental Status    HPI Matthew Bruce is a 82 y.o. male with a history of diabetes and hypertension is presenting to the emergency department today after an episode of altered mental status. Per his daughter had gotten up and not taking his meds when he became confused. She said the patient was staring blankly at her in a hallway in their home and looking in a closet seemingly without any purpose. She said that he also appeared pale at the time. Should she settle down in a chair. Call Olena Heckle. Medics arrived and found to be slightly hypotensive at 90 systolic and also with a heart rate in the high 40s to 50s. At this time the patient is without any complaint and says that he feels fine. Upon review of his chart as well as as in the family he has had no new medication changes physically to his blood pressure medications.   Past Medical History:  Diagnosis Date  . Diabetes mellitus without complication ([place])   . Hypertension     Patient Active Problem List   Diagnosis Date Noted  . Embolic stroke involving right cerebellar artery ([place]) 11/10/2014  . Hypoglycemia 11/05/2014    Past Surgical History:  Procedure Laterality Date  . CARDIAC SURGERY    . VALVE REPLACEMENT      Prior to Admission medications   Medication Sig Start Date End Date Taking? Authorizing Provider  amLODipine (NORVASC) 5 MG tablet Take 1 tablet (5 mg total) by mouth daily. 11/06/14   Aldean Jewett, MD  aspirin EC 81 MG EC tablet Take 1 tablet (81 mg total) by mouth daily. 11/06/14   Aldean Jewett, MD  Cholecalciferol 1000 UNITS capsule Take 1,000 Units by mouth daily.    Historical Provider, MD  finasteride  (PROSCAR) 5 MG tablet Take 5 mg by mouth daily.    Historical Provider, MD  ibuprofen (ADVIL,MOTRIN) 800 MG tablet Take 800 mg by mouth every 8 (eight) hours as needed.    Historical Provider, MD  latanoprost (XALATAN) 0.005 % ophthalmic solution Place 1 drop into both eyes at bedtime.    Historical Provider, MD  lovastatin (ALTOPREV) 40 MG 24 hr tablet Take 40 mg by mouth at bedtime.    Historical Provider, MD  meclizine (ANTIVERT) 12.5 MG tablet Take 12.5 mg by mouth at bedtime.    Historical Provider, MD  metFORMIN (GLUCOPHAGE) 1000 MG tablet Take 1,000 mg by mouth 2 (two) times daily with a meal.    Historical Provider, MD  Multiple Vitamin (MULTIVITAMIN WITH MINERALS) TABS tablet Take 1 tablet by mouth daily.    Historical Provider, MD  tamsulosin (FLOMAX) 0.4 MG CAPS capsule Take 0.4 mg by mouth daily.    Historical Provider, MD  verapamil (CALAN-SR) 180 MG CR tablet Take 180 mg by mouth at bedtime.    Historical Provider, MD    Allergies Review of patient's allergies indicates no known allergies.  Family History  Problem Relation Age of Onset  . Heart failure Neg Hx     Social History Social History  Substance Use Topics  . Smoking status: Former Smoker    Types: Cigarettes  . Smokeless tobacco: Current User    Types: Chew  . Alcohol  use No    Review of Systems Constitutional: No fever/chills Eyes: No visual changes. ENT: No sore throat. Cardiovascular: Denies chest pain. Respiratory: Denies shortness of breath. Gastrointestinal: No abdominal pain.  No nausea, no vomiting.  No diarrhea.  No constipation. Genitourinary: Negative for dysuria. Musculoskeletal: Negative for back pain. Skin: Negative for rash. Neurological: Negative for headaches, focal weakness or numbness. 10-point ROS otherwise negative.  ____________________________________________   PHYSICAL EXAM:  VITAL SIGNS: ED Triage Vitals  Enc Vitals Group     BP 11/19/15 1106 (!) 168/81     Pulse Rate  11/19/15 1106 (!) 54     Resp 11/19/15 1130 13     Temp 11/19/15 1110 98 F (36.7 C)     Temp Source 11/19/15 1110 Oral     SpO2 11/19/15 1106 96 %     Weight 11/19/15 1112 202 lb (91.6 kg)     Height 11/19/15 1112 '6\' 1"'$  (1.854 m)     Head Circumference --      Peak Flow --      Pain Score --      Pain Loc --      Pain Edu? --      Excl. in [place]? --     Constitutional: Alert and oriented. Well appearing and in no acute distress. Eyes: Conjunctivae are normal. PERRL. EOMI. Head: Atraumatic. Nose: No congestion/rhinnorhea. Mouth/Throat: Mucous membranes are moist.   Neck: No stridor.   Cardiovascular: Normal rate, regular rhythm. Grossly normal heart sounds.  Good peripheral circulation. Respiratory: Normal respiratory effort.  No retractions. Lungs CTAB. Gastrointestinal: Soft and nontender. No distention. No abdominal bruits. No CVA tenderness. Musculoskeletal: No lower extremity tenderness nor edema.  No joint effusions. Neurologic:  Normal speech and language. No gross focal neurologic deficits are appreciated. No gait instability. Skin:  Skin is warm, dry and intact. No rash noted. Psychiatric: Mood and affect are normal. Speech and behavior are normal.  ____________________________________________   LABS (all labs ordered are listed, but only abnormal results are displayed)  Labs Reviewed  CBC WITH DIFFERENTIAL/PLATELET - Abnormal; Notable for the following:       Result Value   RBC 4.08 (*)    Hemoglobin 12.8 (*)    HCT 36.3 (*)    All other components within normal limits  COMPREHENSIVE METABOLIC PANEL - Abnormal; Notable for the following:    Glucose, Bld 119 (*)    BUN 24 (*)    Creatinine, Ser 1.29 (*)    Total Protein 6.1 (*)    GFR calc non Af Amer 48 (*)    GFR calc Af Amer 56 (*)    All other components within normal limits  URINALYSIS COMPLETEWITH MICROSCOPIC ([facility] ONLY) - Abnormal; Notable for the following:    Color, Urine STRAW (*)    APPearance  CLEAR (*)    Protein, ur 30 (*)    All other components within normal limits  TROPONIN I   ____________________________________________  EKG  ED ECG REPORT I, Doran Stabler, the attending physician, personally viewed and interpreted this ECG.   Date: 11/19/2015  EKG Time: 1107  Rate: 51  Rhythm: sinus bradycardia  Axis: Normal axis  Intervals:none  ST&T Change: T-wave inversion in aVL. Otherwise no ST elevation or depression or abnormal T-wave inversion. Similar to the EKG done on 11/04/2014. No significant change except for the now presents bradycardia. ____________________________________________  RADIOLOGY  CT Head Wo Contrast (Accession [id number]) (Order [id number])  Imaging  Date: 11/19/2015 Department:  [place] EMERGENCY DEPARTMENT Released By/Authorizing: Orbie Pyo, MD (auto-released)  PACS Images   Show images for CT Head Wo Contrast  Study Result   CLINICAL DATA:  Altered mental status  EXAM: CT HEAD WITHOUT CONTRAST  TECHNIQUE: Contiguous axial images were obtained from the base of the skull through the vertex without intravenous contrast.  COMPARISON:  11/04/2014  FINDINGS: Brain: No intracranial hemorrhage, mass effect or midline shift. Stable atrophy and chronic white matter disease. Ventricular size is stable from prior exam. No acute cortical infarction. No mass lesion is noted on this unenhanced scan.  Vascular: Atherosclerotic calcifications of carotid siphon.  Skull: No skull fracture is noted.  Sinuses/Orbits: There is mucosal thickening with partial opacification ethmoid air cells. Mucosal thickening with partial opacification right maxillary sinus. The mastoid air cells are unremarkable.  Other: None  IMPRESSION: No acute intracranial abnormality. No definite acute cortical infarction. Stable atrophy and chronic white matter disease. Paranasal sinuses disease as described  above.   Electronically Signed   By: Lahoma Crocker M.D.   On: 11/19/2015 12:56    DG Chest 2 View (Accession [id number]) (Order [id number])  Imaging  Date: 11/19/2015 Department: [facility] EMERGENCY DEPARTMENT Released By/Authorizing: Orbie Pyo, MD (auto-released)  PACS Images   Show images for DG Chest 2 View  Study Result   CLINICAL DATA:  Altered mental status.  EXAM: CHEST  2 VIEW  COMPARISON:  None.  FINDINGS: Heart size and mediastinal contours are normal. Atherosclerotic changes noted at the aortic arch. Median sternotomy wires appear intact and normally aligned.  Platelike opacity noted in the right upper lobe. Lungs otherwise clear. No pleural effusion or pneumothorax seen. No acute or suspicious osseous finding.  IMPRESSION: Platelike opacity in the right upper lobe, highly suspicious for pneumonia. Recommend follow-up chest x-ray to ensure resolution and prove benignity.  Aortic atherosclerosis.   Electronically Signed   By: Franki Cabot M.D.   On: 11/19/2015 12:42      ____________________________________________   PROCEDURES  Procedure(s) performed:   Procedures  Critical Care performed:   ____________________________________________   INITIAL IMPRESSION / ASSESSMENT AND PLAN / ED COURSE  Pertinent labs & imaging results that were available during my care of the patient were reviewed by me and considered in my medical decision making (see chart for details).  ----------------------------------------- 2:55 PM on 11/19/2015 -----------------------------------------  Patient now with blood pressure that is in the 371I systolic. However, the heart rate remains in the 50s. I discussed the case with the patient's primary care doctor, Dr. Gilford Rile, who recommends taking the patient off the verapamil. We will keep the patient on the amlodipine. Unclear cause of the patient's bradycardia but he has  been sinus on the monitor. He has no complaints at this time. He has not been confused and is at his baseline mental status. He has 3 children at the bedside and I explained to them The plan to them as well to discontinue the verapamil and continue amlodipine and they're understanding and willing to comply.  Furthermore, the patient was found a possible pneumonia in his right upper field. His daughter is now saying that he was having a runny nose and also cough during the week. I'll be prescribing the patient outpatient anabiotic to cover for the pneumonia. The family as well as the patient were also aware that they will need a chest x-ray to be repeated to make sure that the lesion is resolving. We did discuss that  this could also represent early growth of the cancer and that is why the repeat chest x-ray would be necessary. They're understanding and willing to comply with this plan.     Clinical Course     ____________________________________________   FINAL CLINICAL IMPRESSION(S) / ED DIAGNOSES  Bradycardia. Confusion. Right upper lobe pneumonia.    NEW MEDICATIONS STARTED DURING THIS VISIT:  New Prescriptions   No medications on file     Note:  This document was prepared using Dragon voice recognition software and may include unintentional dictation errors.    Orbie Pyo, MD 11/19/15 [phone]

## 2015-11-19 NOTE — ED Triage Notes (Signed)
Pt to ED via EMS for altered mental status, call made by daughter.  Pt alert on arrival and oriented x3, unoriented to situation. Pt states he does not know why he had to come to ED. EMS reports BP 90/40, for which they administered 248m NS and BP on arrival 168/81.  HR in 50's.

## 2015-11-26 DIAGNOSIS — I10 Essential (primary) hypertension: Secondary | ICD-10-CM | POA: Diagnosis not present

## 2015-11-26 DIAGNOSIS — R001 Bradycardia, unspecified: Secondary | ICD-10-CM | POA: Diagnosis not present

## 2015-12-11 DIAGNOSIS — Z23 Encounter for immunization: Secondary | ICD-10-CM | POA: Diagnosis not present

## 2016-01-26 DIAGNOSIS — L97529 Non-pressure chronic ulcer of other part of left foot with unspecified severity: Secondary | ICD-10-CM | POA: Diagnosis not present

## 2016-01-28 DIAGNOSIS — L97529 Non-pressure chronic ulcer of other part of left foot with unspecified severity: Secondary | ICD-10-CM | POA: Diagnosis not present

## 2016-02-01 DIAGNOSIS — L97521 Non-pressure chronic ulcer of other part of left foot limited to breakdown of skin: Secondary | ICD-10-CM | POA: Diagnosis not present

## 2016-02-01 DIAGNOSIS — E1142 Type 2 diabetes mellitus with diabetic polyneuropathy: Secondary | ICD-10-CM | POA: Diagnosis not present

## 2016-02-01 DIAGNOSIS — Z0000 Encounter for general adult medical examination without abnormal findings: Secondary | ICD-10-CM | POA: Diagnosis not present

## 2016-02-01 DIAGNOSIS — I96 Gangrene, not elsewhere classified: Secondary | ICD-10-CM | POA: Diagnosis not present

## 2016-02-02 ENCOUNTER — Other Ambulatory Visit
Admission: RE | Admit: 2016-02-02 | Discharge: 2016-02-02 | Disposition: A | Discharge status: Home / Self Care | Payer: Commercial Managed Care - HMO | Source: Ambulatory Visit | Attending: Vascular Surgery | Admitting: Vascular Surgery

## 2016-02-02 ENCOUNTER — Ambulatory Visit (INDEPENDENT_AMBULATORY_CARE_PROVIDER_SITE_OTHER): Payer: Commercial Managed Care - HMO | Admitting: Vascular Surgery

## 2016-02-02 ENCOUNTER — Encounter (INDEPENDENT_AMBULATORY_CARE_PROVIDER_SITE_OTHER): Payer: Self-pay | Admitting: Vascular Surgery

## 2016-02-02 ENCOUNTER — Encounter (INDEPENDENT_AMBULATORY_CARE_PROVIDER_SITE_OTHER): Payer: Self-pay

## 2016-02-02 ENCOUNTER — Other Ambulatory Visit (INDEPENDENT_AMBULATORY_CARE_PROVIDER_SITE_OTHER): Payer: Self-pay | Admitting: Vascular Surgery

## 2016-02-02 VITALS — BP 129/66 | HR 79 | Resp 16 | Ht 72.0 in | Wt 204.8 lb

## 2016-02-02 DIAGNOSIS — Z7982 Long term (current) use of aspirin: Secondary | ICD-10-CM | POA: Insufficient documentation

## 2016-02-02 DIAGNOSIS — I38 Endocarditis, valve unspecified: Secondary | ICD-10-CM

## 2016-02-02 DIAGNOSIS — Z7984 Long term (current) use of oral hypoglycemic drugs: Secondary | ICD-10-CM | POA: Insufficient documentation

## 2016-02-02 DIAGNOSIS — Z8673 Personal history of transient ischemic attack (TIA), and cerebral infarction without residual deficits: Secondary | ICD-10-CM | POA: Insufficient documentation

## 2016-02-02 DIAGNOSIS — E119 Type 2 diabetes mellitus without complications: Secondary | ICD-10-CM | POA: Insufficient documentation

## 2016-02-02 DIAGNOSIS — Z952 Presence of prosthetic heart valve: Secondary | ICD-10-CM | POA: Insufficient documentation

## 2016-02-02 DIAGNOSIS — I70268 Atherosclerosis of native arteries of extremities with gangrene, other extremity: Secondary | ICD-10-CM | POA: Insufficient documentation

## 2016-02-02 DIAGNOSIS — I70262 Atherosclerosis of native arteries of extremities with gangrene, left leg: Secondary | ICD-10-CM | POA: Diagnosis not present

## 2016-02-02 DIAGNOSIS — I10 Essential (primary) hypertension: Secondary | ICD-10-CM | POA: Insufficient documentation

## 2016-02-02 DIAGNOSIS — I63441 Cerebral infarction due to embolism of right cerebellar artery: Secondary | ICD-10-CM

## 2016-02-02 DIAGNOSIS — E1152 Type 2 diabetes mellitus with diabetic peripheral angiopathy with gangrene: Secondary | ICD-10-CM

## 2016-02-02 DIAGNOSIS — Z87891 Personal history of nicotine dependence: Secondary | ICD-10-CM | POA: Insufficient documentation

## 2016-02-02 DIAGNOSIS — L98499 Non-pressure chronic ulcer of skin of other sites with unspecified severity: Secondary | ICD-10-CM

## 2016-02-02 DIAGNOSIS — I70209 Unspecified atherosclerosis of native arteries of extremities, unspecified extremity: Secondary | ICD-10-CM | POA: Insufficient documentation

## 2016-02-02 LAB — CREATININE, SERUM
CREATININE: 1.58 mg/dL — AB (ref 0.61–1.24)
GFR, EST AFRICAN AMERICAN: 44 mL/min — AB (ref >60)
GFR, EST NON AFRICAN AMERICAN: 38 mL/min — AB (ref >60)

## 2016-02-02 LAB — BUN: BUN: 29 mg/dL — ABNORMAL HIGH (ref 6–20)

## 2016-02-02 NOTE — Assessment & Plan Note (Signed)
This is clearly a critical and limb threatening situation. The patient has obvious gangrene of his left third toe. He will have to have the toe removed, but I am very concerned that his perfusion will not allow for this to heal. I have discussed without improvement perfusion, I would expect him to require major amputation. I would recommend an angiogram be performed in the very near future at his convenience. I have discussed the risks and benefits of the angiogram procedure. I have discussed the bypass surgery might be necessary if endovascular treatment is not an option. We would not know this until the time of the procedure. I've recommended further assessment with noninvasive studies of his right leg in the future as he does not have good pulses on the right side either. He does not have any major, critical limb threatening symptoms on the right although he does have a small scab on the right fourth toe that I am somewhat concerned about. I discussed the importance of glucose control and blood pressure regulation. He stopped smoking some years ago. He is on appropriate medical therapy with aspirin and we will likely add a statin agent. His angiogram is scheduled for this week.

## 2016-02-02 NOTE — Progress Notes (Signed)
Patient ID: Matthew Bruce, male   DOB: May 21, 1926, 82 y.o.   MRN: [id number]  Chief Complaint  Patient presents with  . New Patient (Initial Visit)    HPI Matthew Bruce is a 82 y.o. male.  I am asked to see the patient by Dr. Elvina Mattes for evaluation of of his circulation in the setting of a gangrenous third toe.  The patient reports he was seen for this earlier in the summer, but did not proceed with the recommended vascular evaluation and now his toe has turned frankly gangrenous. It throbs and hurts from time to time. He has some neuropathy from his diabetes and he does not complain of pain all the time. His activity is somewhat limited so he does not really describe claudication. He has a small scab on his right fourth toe but no right leg symptoms at this time. He denies fevers or chills or signs of systemic infection. His podiatrist recommended he get his circulation intact as he will need to have his toe amputated. He does not have any clear inciting event or causative factor that started the gangrene. Nothing has made it better.   Past Medical History:  Diagnosis Date  . Diabetes mellitus without complication ([place])   . Hypertension     Past Surgical History:  Procedure Laterality Date  . CARDIAC SURGERY    . VALVE REPLACEMENT      Family History  Problem Relation Age of Onset  . Heart failure Neg Hx   No history of bleeding disorders, clotting disorders, or autoimmune diseases  Social History Social History  Substance Use Topics  . Smoking status: Former Smoker    Types: Cigarettes  . Smokeless tobacco: Current User    Types: Chew  . Alcohol use No  No IV drug use  No Known Allergies  Current Outpatient Prescriptions  Medication Sig Dispense Refill  . amLODipine (NORVASC) 5 MG tablet Take 1 tablet (5 mg total) by mouth daily. 30 tablet 0  . aspirin EC 81 MG EC tablet Take 1 tablet (81 mg total) by mouth daily. 30 tablet 0  . Cholecalciferol 1000 UNITS  capsule Take 1,000 Units by mouth daily.    . ciprofloxacin (CIPRO) 500 MG tablet 2 (two) times daily.     Marland Kitchen doxycycline (VIBRAMYCIN) 100 MG capsule Take by mouth.    Marland Kitchen ibuprofen (ADVIL,MOTRIN) 800 MG tablet Take 800 mg by mouth every 8 (eight) hours as needed.    . latanoprost (XALATAN) 0.005 % ophthalmic solution Place 1 drop into both eyes at bedtime.    . lovastatin (ALTOPREV) 40 MG 24 hr tablet Take 40 mg by mouth at bedtime.    . meclizine (ANTIVERT) 12.5 MG tablet Take 12.5 mg by mouth at bedtime.    . metFORMIN (GLUCOPHAGE) 1000 MG tablet Take 1,000 mg by mouth 2 (two) times daily with a meal.    . Multiple Vitamin (MULTIVITAMIN WITH MINERALS) TABS tablet Take 1 tablet by mouth daily.    . tamsulosin (FLOMAX) 0.4 MG CAPS capsule Take 0.4 mg by mouth daily.    . verapamil (CALAN-SR) 180 MG CR tablet Take 180 mg by mouth at bedtime.    . finasteride (PROSCAR) 5 MG tablet Take 5 mg by mouth daily.    . tamsulosin (FLOMAX) 0.4 MG CAPS capsule TAKE 1 CAPSULE ONE TIME DAILY     No current facility-administered medications for this visit.       REVIEW OF SYSTEMS (Negative unless checked)  Constitutional: [] Weight loss  [] Fever  [] Chills Cardiac: [] Chest pain   [] Chest pressure   [] Palpitations   [] Shortness of breath when laying flat   [] Shortness of breath at rest   [] Shortness of breath with exertion. Vascular:  [x] Pain in legs with walking   [] Pain in legs at rest   [] Pain in legs when laying flat   [] Claudication   [] Pain in feet when walking  [] Pain in feet at rest  [] Pain in feet when laying flat   [] History of DVT   [] Phlebitis   [] Swelling in legs   [] Varicose veins   [x] Non-healing ulcers Pulmonary:   [] Uses home oxygen   [] Productive cough   [] Hemoptysis   [] Wheeze  [] COPD   [] Asthma Neurologic:  [] Dizziness  [] Blackouts   [] Seizures   [] History of stroke   [] History of TIA  [] Aphasia   [] Temporary blindness   [] Dysphagia   [] Weakness or numbness in arms   [x] Weakness or numbness  in legs Musculoskeletal:  [] Arthritis   [] Joint swelling   [] Joint pain   [] Low back pain Hematologic:  [] Easy bruising  [] Easy bleeding   [] Hypercoagulable state   [] Anemic  [] Hepatitis Gastrointestinal:  [] Blood in stool   [] Vomiting blood  [] Gastroesophageal reflux/heartburn   [] Abdominal pain Genitourinary:  [] Chronic kidney disease   [] Difficult urination  [] Frequent urination  [] Burning with urination   [] Hematuria Skin:  [] Rashes   [x] Ulcers   [x] Wounds Psychological:  [] History of anxiety   []  History of major depression.    Physical Exam BP 129/66   Pulse 79   Resp 16   Ht 6' (1.829 m)   Wt 204 lb 12.8 oz (92.9 kg)   BMI 27.78 kg/m  Gen:  WD/WN, NAD Head: [place]/AT, No temporalis wasting. Prominent temp pulse not noted. Ear/Nose/Throat: Hearing grossly intact, nares w/o erythema or drainage, oropharynx w/o Erythema/Exudate Eyes: Conjunctiva clear, sclera non-icteric  Neck: trachea midline.  No bruit or JVD.  Pulmonary:  Good air movement, clear to auscultation bilaterally.  Cardiac: RRR, normal S1, S2, no Murmurs, rubs or gallops. Vascular:  Vessel Right Left  Radial Palpable Palpable  Ulnar Palpable Palpable  Brachial Palpable Palpable  Carotid Palpable, without bruit Palpable, without bruit  Aorta Not palpable N/A  Femoral Palpable Palpable  Popliteal 1+ Palpable Not Palpable  PT Not Palpable Trace Palpable  DP 1+ Palpable Not Palpable   Gastrointestinal: soft, non-tender/non-distended. No guarding/reflex. No masses, surgical incisions, or scars. Musculoskeletal: M/S 5/5 throughout.  Gangrenous left third toe. 1+ BLE edema. Neurologic: Sensation seems diminished in extremities.  Symmetrical.  Speech is fluent. Motor exam as listed above. Psychiatric: Judgment intact, Mood & affect appropriate for pt's clinical situation. Dermatologic: Left third toe clearly gangrenous with minimal erythema surrounding it. Lymph : No Cervical, Axillary, or Inguinal  lymphadenopathy.   Radiology No results found.  Labs Recent Results (from the past 2160 hour(s))  CBC with Differential     Status: Abnormal   Collection Time: 11/19/15 11:11 AM  Result Value Ref Range   WBC 7.7 3.8 - 10.6 K/uL   RBC 4.08 (L) 4.40 - 5.90 MIL/uL   Hemoglobin 12.8 (L) 13.0 - 18.0 g/dL   HCT 36.3 (L) 40.0 - 52.0 %   MCV 89.1 80.0 - 100.0 fL   MCH 31.3 26.0 - 34.0 pg   MCHC 35.2 32.0 - 36.0 g/dL   RDW 13.8 11.5 - 14.5 %   Platelets 185 150 - 440 K/uL   Neutrophils Relative % 65 %   Neutro  Abs 5.1 1.4 - 6.5 K/uL   Lymphocytes Relative 20 %   Lymphs Abs 1.5 1.0 - 3.6 K/uL   Monocytes Relative 7 %   Monocytes Absolute 0.5 0.2 - 1.0 K/uL   Eosinophils Relative 7 %   Eosinophils Absolute 0.5 0 - 0.7 K/uL   Basophils Relative 1 %   Basophils Absolute 0.1 0 - 0.1 K/uL  Comprehensive metabolic panel     Status: Abnormal   Collection Time: 11/19/15 11:11 AM  Result Value Ref Range   Sodium 138 135 - 145 mmol/L   Potassium 5.0 3.5 - 5.1 mmol/L   Chloride 108 101 - 111 mmol/L   CO2 23 22 - 32 mmol/L   Glucose, Bld 119 (H) 65 - 99 mg/dL   BUN 24 (H) 6 - 20 mg/dL   Creatinine, Ser 1.29 (H) 0.61 - 1.24 mg/dL   Calcium 9.5 8.9 - 10.3 mg/dL   Total Protein 6.1 (L) 6.5 - 8.1 g/dL   Albumin 3.6 3.5 - 5.0 g/dL   AST 21 15 - 41 U/L   ALT 17 17 - 63 U/L   Alkaline Phosphatase 57 38 - 126 U/L   Total Bilirubin 0.4 0.3 - 1.2 mg/dL   GFR calc non Af Amer 48 (L) >60 mL/min   GFR calc Af Amer 56 (L) >60 mL/min    Comment: (NOTE) The eGFR has been calculated using the CKD EPI equation. This calculation has not been validated in all clinical situations. eGFR's persistently <60 mL/min signify possible Chronic Kidney Disease.    Anion gap 7 5 - 15  Troponin I     Status: None   Collection Time: 11/19/15 11:11 AM  Result Value Ref Range   Troponin I <0.03 <0.03 ng/mL  Urinalysis complete, with microscopic ([facility] only)     Status: Abnormal   Collection Time: 11/19/15  1:26 PM   Result Value Ref Range   Color, Urine STRAW (A) YELLOW   APPearance CLEAR (A) CLEAR   Glucose, UA NEGATIVE NEGATIVE mg/dL   Bilirubin Urine NEGATIVE NEGATIVE   Ketones, ur NEGATIVE NEGATIVE mg/dL   Specific Gravity, Urine 1.006 1.005 - 1.030   Hgb urine dipstick NEGATIVE NEGATIVE   pH 7.0 5.0 - 8.0   Protein, ur 30 (A) NEGATIVE mg/dL   Nitrite NEGATIVE NEGATIVE   Leukocytes, UA NEGATIVE NEGATIVE   RBC / HPF 0-5 0 - 5 RBC/hpf   WBC, UA 0-5 0 - 5 WBC/hpf   Bacteria, UA NONE SEEN NONE SEEN   Squamous Epithelial / LPF NONE SEEN NONE SEEN    Assessment/Plan:  Embolic stroke involving right cerebellar artery Remote. No recent symptoms.  Diabetes ([place]) blood glucose control important in reducing the progression of atherosclerotic disease. Also, involved in wound healing. On appropriate medications.   Essential hypertension blood pressure control important in reducing the progression of atherosclerotic disease. On appropriate oral medications.   Valvular heart disease Status post valve replacement  Atherosclerosis of native arteries of the extremities with gangrene [facility]) This is clearly a critical and limb threatening situation. The patient has obvious gangrene of his left third toe. He will have to have the toe removed, but I am very concerned that his perfusion will not allow for this to heal. I have discussed without improvement perfusion, I would expect him to require major amputation. I would recommend an angiogram be performed in the very near future at his convenience. I have discussed the risks and benefits of the angiogram procedure. I have  discussed the bypass surgery might be necessary if endovascular treatment is not an option. We would not know this until the time of the procedure. I've recommended further assessment with noninvasive studies of his right leg in the future as he does not have good pulses on the right side either. He does not have any major, critical limb  threatening symptoms on the right although he does have a small scab on the right fourth toe that I am somewhat concerned about. I discussed the importance of glucose control and blood pressure regulation. He stopped smoking some years ago. He is on appropriate medical therapy with aspirin and we will likely add a statin agent. His angiogram is scheduled for this week.      Leotis Pain 02/02/2016, 11:45 AM   This note was created with Dragon medical transcription system.  Any errors from dictation are unintentional.

## 2016-02-02 NOTE — Assessment & Plan Note (Signed)
blood glucose control important in reducing the progression of atherosclerotic disease. Also, involved in wound healing. On appropriate medications.  

## 2016-02-02 NOTE — Assessment & Plan Note (Signed)
Status post valve replacement

## 2016-02-02 NOTE — Patient Instructions (Signed)
Peripheral Vascular Disease Peripheral vascular disease (PVD) is a disease of the blood vessels that are not part of your heart and brain. A simple term for PVD is poor circulation. In most cases, PVD narrows the blood vessels that carry blood from your heart to the rest of your body. This can result in a decreased supply of blood to your arms, legs, and internal organs, like your stomach or kidneys. However, it most often affects a person's lower legs and feet. There are two types of PVD.  Organic PVD. This is the more common type. It is caused by damage to the structure of blood vessels.  Functional PVD. This is caused by conditions that make blood vessels contract and tighten (spasm). Without treatment, PVD tends to get worse over time. PVD can also lead to acute ischemic limb. This is when an arm or limb suddenly has trouble getting enough blood. This is a medical emergency. CAUSES Each type of PVD has many different causes. The most common cause of PVD is buildup of a fatty material (plaque) inside of your arteries (atherosclerosis). Small amounts of plaque can break off from the walls of the blood vessels and become lodged in a smaller artery. This blocks blood flow and can cause acute ischemic limb. Other common causes of PVD include:  Blood clots that form inside of blood vessels.  Injuries to blood vessels.  Diseases that cause inflammation of blood vessels or cause blood vessel spasms.  Health behaviors and health history that increase your risk of developing PVD. RISK FACTORS  You may have a greater risk of PVD if you:  Have a family history of PVD.  Have certain medical conditions, including:  High cholesterol.  Diabetes.  High blood pressure (hypertension).  Coronary heart disease.  Past problems with blood clots.  Past injury, such as burns or a broken bone. These may have damaged blood vessels in your limbs.  Buerger disease. This is caused by inflamed blood  vessels in your hands and feet.  Some forms of arthritis.  Rare birth defects that affect the arteries in your legs.  Use tobacco.  Do not get enough exercise.  Are obese.  Are age 50 or older. SIGNS AND SYMPTOMS  PVD may cause many different symptoms. Your symptoms depend on what part of your body is not getting enough blood. Some common signs and symptoms include:  Cramps in your lower legs. This may be a symptom of poor leg circulation (claudication).  Pain and weakness in your legs while you are physically active that goes away when you rest (intermittent claudication).  Leg pain when at rest.  Leg numbness, tingling, or weakness.  Coldness in a leg or foot, especially when compared with the other leg.  Skin or hair changes. These can include:  Hair loss.  Shiny skin.  Pale or bluish skin.  Thick toenails.  Inability to get or maintain an erection (erectile dysfunction). People with PVD are more prone to developing ulcers and sores on their toes, feet, or legs. These may take longer than normal to heal. DIAGNOSIS Your health care provider may diagnose PVD from your signs and symptoms. The health care provider will also do a physical exam. You may have tests to find out what is causing your PVD and determine its severity. Tests may include:  Blood pressure recordings from your arms and legs and measurements of the strength of your pulses (pulse volume recordings).  Imaging studies using sound waves to take pictures of   the blood flow through your blood vessels (Doppler ultrasound).  Injecting a dye into your blood vessels before having imaging studies using:  X-rays (angiogram or arteriogram).  Computer-generated X-rays (CT angiogram).  A powerful electromagnetic field and a computer (magnetic resonance angiogram or MRA). TREATMENT Treatment for PVD depends on the cause of your condition and the severity of your symptoms. It also depends on your age. Underlying  causes need to be treated and controlled. These include long-lasting (chronic) conditions, such as diabetes, high cholesterol, and high blood pressure. You may need to first try making lifestyle changes and taking medicines. Surgery may be needed if these do not work. Lifestyle changes may include:  Quitting smoking.  Exercising regularly.  Following a low-fat, low-cholesterol diet. Medicines may include:  Blood thinners to prevent blood clots.  Medicines to improve blood flow.  Medicines to improve your blood cholesterol levels. Surgical procedures may include:  A procedure that uses an inflated balloon to open a blocked artery and improve blood flow (angioplasty).  A procedure to put in a tube (stent) to keep a blocked artery open (stent implant).  Surgery to reroute blood flow around a blocked artery (peripheral bypass surgery).  Surgery to remove dead tissue from an infected wound on the affected limb.  Amputation. This is surgical removal of the affected limb. This may be necessary in cases of acute ischemic limb that are not improved through medical or surgical treatments. HOME CARE INSTRUCTIONS  Take medicines only as directed by your health care provider.  Do not use any tobacco products, including cigarettes, chewing tobacco, or electronic cigarettes. If you need help quitting, ask your health care provider.  Lose weight if you are overweight, and maintain a healthy weight as directed by your health care provider.  Eat a diet that is low in fat and cholesterol. If you need help, ask your health care provider.  Exercise regularly. Ask your health care provider to suggest some good activities for you.  Use compression stockings or other mechanical devices as directed by your health care provider.  Take good care of your feet.  Wear comfortable shoes that fit well.  Check your feet often for any cuts or sores. SEEK MEDICAL CARE IF:  You have cramps in your legs  while walking.  You have leg pain when you are at rest.  You have coldness in a leg or foot.  Your skin changes.  You have erectile dysfunction.  You have cuts or sores on your feet that are not healing. SEEK IMMEDIATE MEDICAL CARE IF:  Your arm or leg turns cold and blue.  Your arms or legs become red, warm, swollen, painful, or numb.  You have chest pain or trouble breathing.  You suddenly have weakness in your face, arm, or leg.  You become very confused or lose the ability to speak.  You suddenly have a very bad headache or lose your vision.   This information is not intended to replace advice given to you by your health care provider. Make sure you discuss any questions you have with your health care provider.   Document Released: 04/28/2004 Document Revised: 04/11/2014 Document Reviewed: 08/29/2013 Elsevier Interactive Patient Education 2016 Elsevier Inc.  

## 2016-02-02 NOTE — Assessment & Plan Note (Signed)
Remote. No recent symptoms.

## 2016-02-02 NOTE — Assessment & Plan Note (Signed)
blood pressure control important in reducing the progression of atherosclerotic disease. On appropriate oral medications.  

## 2016-02-03 ENCOUNTER — Encounter: Payer: Self-pay | Admitting: *Deleted

## 2016-02-03 ENCOUNTER — Ambulatory Visit
Admission: RE | Admit: 2016-02-03 | Discharge: 2016-02-03 | Disposition: A | Discharge status: Home / Self Care | Payer: Commercial Managed Care - HMO | Source: Ambulatory Visit | Attending: Vascular Surgery | Admitting: Vascular Surgery

## 2016-02-03 ENCOUNTER — Encounter
Admission: RE | Disposition: A | Discharge status: Home / Self Care | Payer: Self-pay | Source: Ambulatory Visit | Attending: Vascular Surgery

## 2016-02-03 DIAGNOSIS — I70268 Atherosclerosis of native arteries of extremities with gangrene, other extremity: Secondary | ICD-10-CM | POA: Diagnosis not present

## 2016-02-03 DIAGNOSIS — E1140 Type 2 diabetes mellitus with diabetic neuropathy, unspecified: Secondary | ICD-10-CM | POA: Insufficient documentation

## 2016-02-03 DIAGNOSIS — Z87891 Personal history of nicotine dependence: Secondary | ICD-10-CM | POA: Insufficient documentation

## 2016-02-03 DIAGNOSIS — Z7984 Long term (current) use of oral hypoglycemic drugs: Secondary | ICD-10-CM | POA: Insufficient documentation

## 2016-02-03 DIAGNOSIS — I10 Essential (primary) hypertension: Secondary | ICD-10-CM | POA: Insufficient documentation

## 2016-02-03 DIAGNOSIS — Z8249 Family history of ischemic heart disease and other diseases of the circulatory system: Secondary | ICD-10-CM | POA: Diagnosis not present

## 2016-02-03 DIAGNOSIS — Z952 Presence of prosthetic heart valve: Secondary | ICD-10-CM | POA: Diagnosis not present

## 2016-02-03 DIAGNOSIS — I70262 Atherosclerosis of native arteries of extremities with gangrene, left leg: Secondary | ICD-10-CM | POA: Diagnosis not present

## 2016-02-03 DIAGNOSIS — Z7982 Long term (current) use of aspirin: Secondary | ICD-10-CM | POA: Diagnosis not present

## 2016-02-03 DIAGNOSIS — I6340 Cerebral infarction due to embolism of unspecified cerebral artery: Secondary | ICD-10-CM | POA: Diagnosis not present

## 2016-02-03 HISTORY — DX: Unspecified glaucoma: H40.9

## 2016-02-03 HISTORY — PX: PERIPHERAL VASCULAR CATHETERIZATION: SHX172C

## 2016-02-03 HISTORY — DX: Acute myocardial infarction, unspecified: I21.9

## 2016-02-03 LAB — GLUCOSE, CAPILLARY: GLUCOSE-CAPILLARY: 128 mg/dL — AB (ref 65–99)

## 2016-02-03 SURGERY — LOWER EXTREMITY ANGIOGRAPHY
Anesthesia: Moderate Sedation | Site: Leg Lower | Laterality: Left

## 2016-02-03 MED ORDER — SODIUM CHLORIDE 0.9 % IV SOLN: Freq: Once | INTRAVENOUS | Status: DC

## 2016-02-03 MED ORDER — ACETAMINOPHEN 325 MG RE SUPP
325.0000 mg | RECTAL | Status: DC | PRN
Start: 2016-02-03 — End: 2016-02-03
  Filled 2016-02-03: qty 2

## 2016-02-03 MED ORDER — SODIUM CHLORIDE 0.9 % IV SOLN
INTRAVENOUS | Status: DC
  Administered 2016-02-03: 10:00:00 via INTRAVENOUS

## 2016-02-03 MED ORDER — MIDAZOLAM HCL 2 MG/2ML IJ SOLN
INTRAMUSCULAR | Status: AC
  Filled 2016-02-03: qty 2

## 2016-02-03 MED ORDER — DEXTROSE 5 % IV SOLN: 1.5000 g | INTRAVENOUS | Status: DC

## 2016-02-03 MED ORDER — GUAIFENESIN-DM 100-10 MG/5ML PO SYRP: 15.0000 mL | ORAL_SOLUTION | ORAL | Status: DC | PRN

## 2016-02-03 MED ORDER — METHYLPREDNISOLONE SODIUM SUCC 125 MG IJ SOLR
125.0000 mg | INTRAMUSCULAR | Status: DC | PRN
  Administered 2016-02-03: 125 mg via INTRAVENOUS

## 2016-02-03 MED ORDER — FENTANYL CITRATE (PF) 100 MCG/2ML IJ SOLN
INTRAMUSCULAR | Status: DC | PRN
Start: 2016-02-03 — End: 2016-02-03
  Administered 2016-02-03 (×4): 25 ug via INTRAVENOUS
  Administered 2016-02-03: 50 ug via INTRAVENOUS

## 2016-02-03 MED ORDER — FAMOTIDINE 20 MG PO TABS
ORAL_TABLET | ORAL | Status: AC
  Filled 2016-02-03: qty 2

## 2016-02-03 MED ORDER — HEPARIN SODIUM (PORCINE) 1000 UNIT/ML IJ SOLN
INTRAMUSCULAR | Status: AC
  Filled 2016-02-03: qty 1

## 2016-02-03 MED ORDER — LABETALOL HCL 5 MG/ML IV SOLN: 10.0000 mg | INTRAVENOUS | Status: DC | PRN

## 2016-02-03 MED ORDER — DEXTROSE 5 % IV SOLN
INTRAVENOUS | Status: AC
  Filled 2016-02-03 (×18): qty 1.5

## 2016-02-03 MED ORDER — SODIUM CHLORIDE 0.9 % IV SOLN: 500.0000 mL | Freq: Once | INTRAVENOUS | Status: DC | PRN

## 2016-02-03 MED ORDER — SODIUM CHLORIDE 0.9 % IV SOLN
250.0000 mg | Freq: Once | INTRAVENOUS | Status: DC
  Filled 2016-02-03: qty 2

## 2016-02-03 MED ORDER — MIDAZOLAM HCL 2 MG/ML PO SYRP
5.0000 mg | ORAL_SOLUTION | Freq: Once | ORAL | Status: DC
  Filled 2016-02-03: qty 4

## 2016-02-03 MED ORDER — HEPARIN SODIUM (PORCINE) 1000 UNIT/ML IJ SOLN
INTRAMUSCULAR | Status: DC | PRN
  Administered 2016-02-03: 4000 [IU] via INTRAVENOUS

## 2016-02-03 MED ORDER — ONDANSETRON HCL 4 MG/2ML IJ SOLN: 4.0000 mg | Freq: Four times a day (QID) | INTRAMUSCULAR | Status: DC | PRN

## 2016-02-03 MED ORDER — HYDRALAZINE HCL 20 MG/ML IJ SOLN: 5.0000 mg | INTRAMUSCULAR | Status: DC | PRN

## 2016-02-03 MED ORDER — FENTANYL CITRATE (PF) 100 MCG/2ML IJ SOLN
INTRAMUSCULAR | Status: AC
  Filled 2016-02-03: qty 2

## 2016-02-03 MED ORDER — LIDOCAINE-EPINEPHRINE (PF) 1 %-1:200000 IJ SOLN
INTRAMUSCULAR | Status: AC
  Filled 2016-02-03: qty 30

## 2016-02-03 MED ORDER — METHYLPREDNISOLONE SODIUM SUCC 125 MG IJ SOLR
INTRAMUSCULAR | Status: AC
  Administered 2016-02-03: 125 mg via INTRAVENOUS
  Filled 2016-02-03: qty 2

## 2016-02-03 MED ORDER — FAMOTIDINE 20 MG PO TABS
20.0000 mg | ORAL_TABLET | Freq: Once | ORAL | Status: AC
  Administered 2016-02-03: 20 mg via ORAL

## 2016-02-03 MED ORDER — FAMOTIDINE 20 MG PO TABS: 40.0000 mg | ORAL_TABLET | ORAL | Status: DC | PRN

## 2016-02-03 MED ORDER — MIDAZOLAM HCL 5 MG/5ML IJ SOLN
INTRAMUSCULAR | Status: AC
  Filled 2016-02-03: qty 5

## 2016-02-03 MED ORDER — HEPARIN (PORCINE) IN NACL 2-0.9 UNIT/ML-% IJ SOLN
INTRAMUSCULAR | Status: AC
Start: 2016-02-03 — End: 2016-02-03
  Filled 2016-02-03: qty 1000

## 2016-02-03 MED ORDER — METOPROLOL TARTRATE 5 MG/5ML IV SOLN
2.0000 mg | INTRAVENOUS | Status: DC | PRN
Start: 2016-02-03 — End: 2016-02-03

## 2016-02-03 MED ORDER — IOPAMIDOL (ISOVUE-300) INJECTION 61%
INTRAVENOUS | Status: DC | PRN
  Administered 2016-02-03: 65 mL via INTRAVENOUS

## 2016-02-03 MED ORDER — DIPHENHYDRAMINE HCL 50 MG/ML IJ SOLN
25.0000 mg | Freq: Once | INTRAMUSCULAR | Status: AC
  Administered 2016-02-03: 25 mg via INTRAVENOUS

## 2016-02-03 MED ORDER — DIPHENHYDRAMINE HCL 50 MG/ML IJ SOLN
INTRAMUSCULAR | Status: AC
  Filled 2016-02-03: qty 1

## 2016-02-03 MED ORDER — PHENOL 1.4 % MT LIQD: 1.0000 | OROMUCOSAL | Status: DC | PRN

## 2016-02-03 MED ORDER — HYDROMORPHONE HCL 1 MG/ML IJ SOLN: 0.5000 mg | INTRAMUSCULAR | Status: DC | PRN

## 2016-02-03 MED ORDER — MIDAZOLAM HCL 2 MG/2ML IJ SOLN
INTRAMUSCULAR | Status: DC | PRN
  Administered 2016-02-03: 1 mg via INTRAVENOUS
  Administered 2016-02-03: 2 mg via INTRAVENOUS
  Administered 2016-02-03 (×3): 1 mg via INTRAVENOUS

## 2016-02-03 MED ORDER — ACETAMINOPHEN 325 MG PO TABS: 325.0000 mg | ORAL_TABLET | ORAL | Status: DC | PRN

## 2016-02-03 MED ORDER — OXYCODONE-ACETAMINOPHEN 5-325 MG PO TABS: 1.0000 | ORAL_TABLET | ORAL | Status: DC | PRN

## 2016-02-03 MED ORDER — HYDROMORPHONE HCL 1 MG/ML IJ SOLN: 1.0000 mg | Freq: Once | INTRAMUSCULAR | Status: DC

## 2016-02-03 SURGICAL SUPPLY — 21 items
BALLN LUTONIX 5X150X130 (BALLOONS) ×3
BALLN LUTONIX DCB 4X100X130 (BALLOONS) ×3
BALLN LUTONIX DCB 6X100X130 (BALLOONS) ×3
BALLN ULTRVRSE 3X150X150 (BALLOONS) ×3
BALLOON LUTONIX 5X150X130 (BALLOONS) ×1 IMPLANT
BALLOON LUTONIX DCB 4X100X130 (BALLOONS) ×1 IMPLANT
BALLOON LUTONIX DCB 6X100X130 (BALLOONS) ×1 IMPLANT
BALLOON ULTRVRSE 3X150X150 (BALLOONS) ×1 IMPLANT
CATH PIG 70CM (CATHETERS) ×3 IMPLANT
CATH TORCON 5FR 0.38 (CATHETERS) ×3 IMPLANT
CATH VERT 100CM (CATHETERS) ×3 IMPLANT
DEVICE PRESTO INFLATION (MISCELLANEOUS) ×3 IMPLANT
DEVICE STARCLOSE SE CLOSURE (Vascular Products) ×3 IMPLANT
GLIDEWIRE ADV .035X260CM (WIRE) ×3 IMPLANT
PACK ANGIOGRAPHY (CUSTOM PROCEDURE TRAY) ×3 IMPLANT
SHEATH ANL2 6FRX45 HC (SHEATH) ×6 IMPLANT
SHEATH BRITE TIP 5FRX11 (SHEATH) ×3 IMPLANT
SYR MEDRAD MARK V 150ML (SYRINGE) ×3 IMPLANT
TUBING CONTRAST HIGH PRESS 72 (TUBING) ×3 IMPLANT
WIRE G V18X300CM (WIRE) ×3 IMPLANT
WIRE J 3MM .035X145CM (WIRE) ×3 IMPLANT

## 2016-02-03 NOTE — Op Note (Signed)
[facility] Percutaneous Study/Intervention Procedural Note   Date of Surgery: 02/03/2016  Surgeon(s):Sohaib Vereen   Assistants:none  Pre-operative Diagnosis: PAD with gangrene left lower extremity  Post-operative diagnosis: Same  Procedure(s) Performed: 1. Ultrasound guidance for vascular access right femoral artery 2. Catheter placement into left peroneal artery from right femoral approach 3. Aortogram and selective left lower extremity angiogram 4. Percutaneous transluminal angioplasty of peroneal artery with 3 mm diameter angioplasty balloon 5. Percutaneous transluminal angioplasty of the tibioperoneal trunk and distal popliteal artery with 4 mm diameter Lutonix drug-coated balloon  6.  Percutaneous transluminal angioplasty of the left above-knee popliteal artery with 5 mm diameter by 15 cm length Lutonix drug-coated balloon  7.  Percutaneous transluminal angioplasty of the proximal left superficial femoral artery with 6 mm diameter by 10 cm length Lutonix drug-coated balloon 8. StarClose closure device right femoral artery  EBL: 25 cc  Contrast: 65 cc  Fluoro Time: 6.5 minutes  Moderate Conscious Sedation Time: approximately 40 minutes using 6 mg of Versed and 150 mcg of Fentanyl  Indications: Patient is a 82 y.o.male with a gangrenous left third toe. The patient is brought in for angiography for further evaluation and potential treatment. Risks and benefits are discussed and informed consent is obtained  Procedure: The patient was identified and appropriate procedural time out was performed. The patient was then placed supine on the table and prepped and draped in the usual sterile fashion.Moderate conscious sedation was administered during a face to face encounter with the patient throughout the procedure with my supervision of the RN administering medicines  and monitoring the patient's vital signs, pulse oximetry, telemetry and mental status throughout from the start of the procedure until the patient was taken to the recovery room. Ultrasound was used to evaluate the right common femoral artery. It was patent . A digital ultrasound image was acquired. A Seldinger needle was used to access the right common femoral artery under direct ultrasound guidance and a permanent image was performed. A 0.035 J wire was advanced without resistance and a 5Fr sheath was placed. Pigtail catheter was placed into the aorta and an AP aortogram was performed. This demonstrated normal renal arteries and normal aorta and iliac segments without significant stenosis. I then crossed the aortic bifurcation and advanced to the left femoral head. Selective left lower extremity angiogram was then performed. This demonstrated 60-65% stenosis at the origin of the left superficial femoral artery, the popliteal artery above the knee had about a 70% stenosis and then occluded low the knee. The occlusion carried down through the tibioperoneal trunk and the proximal peroneal artery which reconstituted in the proximal to mid segments. The anterior tibial artery reconstituted but then occluded in the midsegment and did not really reconstitute distally. The posterior tibial artery had no identifiable flow. The patient was systemically heparinized and a 6 [place] Ansell sheath was then placed over the [employer] wire. I then used a Kumpe catheter and the advantage wire to navigate through the popliteal and SFA stenoses and then into the popliteal and tibioperoneal trunk occlusion. I was able to advance the advantage wire down into the peroneal artery and exchanged for a 135 cm length CXI catheter in the peroneal artery in its midsegment. Intraluminal flow was confirmed and I then replaced a 0.018 wire. Angioplasty was performed in the mid and proximal peroneal artery and up to the tibioperoneal  trunk with 3 mm diameter angioplasty balloon inflated to 10 atm for 1 minute. The popliteal artery  from the knee up through Hunter's canal was then angioplastied with a 5 mm diameter by 15 cm length Lutonix drug-coated angioplasty balloon inflated to 12 atm for 1 minute. The proximal SFA was treated with a 6 mm diameter by 10 cm length Lutonix drug-coated angioplasty balloon inflated to 12 atm for 1 minute. Completion angiogram showed about a 20-25% residual stenosis in the proximal SFA, about a 30% residual stenosis in the above-knee popliteal artery, but residual disease in the tibioperoneal trunk and distal popliteal artery that was greater than 60%. The peroneal artery itself appeared to have good flow with no greater than 25% residual stenosis identified on imaging. I then used a 4 mm diameter by 10 cm length Lutonix drug-coated angioplasty balloon to treat the distal popliteal artery and tibioperoneal trunk. This was inflated to 6 atm for 1 minute. Completion angiogram following this showed only about a 20-25% residual stenosis that was not flow limiting and no dissection. I elected to terminate the procedure. The sheath was removed and StarClose closure device was deployed in the right femoral artery with excellent hemostatic result. The patient was taken to the recovery room in stable condition having tolerated the procedure well.  Findings:  Aortogram: This demonstrated normal renal arteries and normal aorta and iliac segments without significant stenosis Left Lower Extremity: About a 60-65% stenosis at the origin of the left superficial femoral artery, the popliteal artery above the knee had about a 70% stenosis and then occluded low the knee. The occlusion carried down through the tibioperoneal trunk and the proximal peroneal artery which reconstituted in the proximal to mid segments. The anterior tibial artery reconstituted but then occluded in the midsegment and did not  really reconstitute distally. The posterior tibial artery had no identifiable flow.   Disposition: Patient was taken to the recovery room in stable condition having tolerated the procedure well.  Complications: None  Matthew Bruce 02/03/2016 3:43 PM   This note was created with Dragon Medical transcription system. Any errors in dictation are purely unintentional.

## 2016-02-03 NOTE — Discharge Instructions (Signed)
Angiogram, Care After °Refer to this sheet in the next few weeks. These instructions provide you with information about caring for yourself after your procedure. Your health care provider may also give you more specific instructions. Your treatment has been planned according to current medical practices, but problems sometimes occur. Call your health care provider if you have any problems or questions after your procedure. °WHAT TO EXPECT AFTER THE PROCEDURE °After your procedure, it is typical to have the following: °· Bruising at the catheter insertion site that usually fades within 1-2 weeks. °· Blood collecting in the tissue (hematoma) that may be painful to the touch. It should usually decrease in size and tenderness within 1-2 weeks. °HOME CARE INSTRUCTIONS °· Take medicines only as directed by your health care provider. °· You may shower 24-48 hours after the procedure or as directed by your health care provider. Remove the bandage (dressing) and gently wash the site with plain soap and water. Pat the area dry with a clean towel. Do not rub the site, because this may cause bleeding. °· Do not take baths, swim, or use a hot tub until your health care provider approves. °· Check your insertion site every day for redness, swelling, or drainage. °· Do not apply powder or lotion to the site. °· Do not lift over 10 lb (4.5 kg) for 5 days after your procedure or as directed by your health care provider. °· Ask your health care provider when it is okay to: °¨ Return to work or school. °¨ Resume usual physical activities or sports. °¨ Resume sexual activity. °· Do not drive home if you are discharged the same day as the procedure. Have someone else drive you. °· You may drive 24 hours after the procedure unless otherwise instructed by your health care provider. °· Do not operate machinery or power tools for 24 hours after the procedure or as directed by your health care provider. °· If your procedure was done as an  outpatient procedure, which means that you went home the same day as your procedure, a responsible adult should be with you for the first 24 hours after you arrive home. °· Keep all follow-up visits as directed by your health care provider. This is important. °SEEK MEDICAL CARE IF: °· You have a fever. °· You have chills. °· You have increased bleeding from the catheter insertion site. Hold pressure on the site. °SEEK IMMEDIATE MEDICAL CARE IF: °· You have unusual pain at the catheter insertion site. °· You have redness, warmth, or swelling at the catheter insertion site. °· You have drainage (other than a small amount of blood on the dressing) from the catheter insertion site. °· The catheter insertion site is bleeding, and the bleeding does not stop after 30 minutes of holding steady pressure on the site. °· The area near or just beyond the catheter insertion site becomes pale, cool, tingly, or numb. °  °This information is not intended to replace advice given to you by your health care provider. Make sure you discuss any questions you have with your health care provider. °  °Document Released: 10/07/2004 Document Revised: 04/11/2014 Document Reviewed: 08/22/2012 °Elsevier Interactive Patient Education ©2016 Elsevier Inc. ° °

## 2016-02-03 NOTE — H&P (Signed)
[facility] History & Physical Update  The patient was interviewed and re-examined.  The patient's previous History and Physical has been reviewed and is unchanged.  There is no change in the plan of care. We plan to proceed with the scheduled procedure.  Leotis Pain, MD  02/03/2016, 12:23 PM

## 2016-02-04 ENCOUNTER — Encounter: Payer: Self-pay | Admitting: Vascular Surgery

## 2016-02-10 ENCOUNTER — Ambulatory Visit
Admission: RE | Admit: 2016-02-10 | Discharge: 2016-02-10 | Disposition: A | Discharge status: Home / Self Care | Payer: Commercial Managed Care - HMO | Source: Ambulatory Visit | Attending: Podiatry | Admitting: Podiatry

## 2016-02-10 DIAGNOSIS — I493 Ventricular premature depolarization: Secondary | ICD-10-CM | POA: Diagnosis not present

## 2016-02-10 DIAGNOSIS — Z01812 Encounter for preprocedural laboratory examination: Secondary | ICD-10-CM | POA: Insufficient documentation

## 2016-02-10 DIAGNOSIS — E1142 Type 2 diabetes mellitus with diabetic polyneuropathy: Secondary | ICD-10-CM | POA: Diagnosis not present

## 2016-02-10 DIAGNOSIS — Z01810 Encounter for preprocedural cardiovascular examination: Secondary | ICD-10-CM | POA: Diagnosis not present

## 2016-02-10 DIAGNOSIS — I96 Gangrene, not elsewhere classified: Secondary | ICD-10-CM | POA: Diagnosis not present

## 2016-02-10 HISTORY — DX: Anemia, unspecified: D64.9

## 2016-02-10 HISTORY — DX: Hyperlipidemia, unspecified: E78.5

## 2016-02-10 HISTORY — DX: Peripheral vascular disease, unspecified: I73.9

## 2016-02-10 HISTORY — DX: Calculus of kidney: N20.0

## 2016-02-10 HISTORY — DX: Atherosclerotic heart disease of native coronary artery without angina pectoris: I25.10

## 2016-02-10 LAB — BASIC METABOLIC PANEL
Anion gap: 9 (ref 5–15)
BUN: 34 mg/dL — AB (ref 6–20)
CALCIUM: 9.9 mg/dL (ref 8.9–10.3)
CO2: 20 mmol/L — AB (ref 22–32)
CREATININE: 1.59 mg/dL — AB (ref 0.61–1.24)
Chloride: 108 mmol/L (ref 101–111)
GFR calc Af Amer: 44 mL/min — ABNORMAL LOW (ref >60)
GFR calc non Af Amer: 38 mL/min — ABNORMAL LOW (ref >60)
GLUCOSE: 185 mg/dL — AB (ref 65–99)
Potassium: 5 mmol/L (ref 3.5–5.1)
Sodium: 137 mmol/L (ref 135–145)

## 2016-02-10 LAB — HEMOGLOBIN: HEMOGLOBIN: 13.3 g/dL (ref 13.0–18.0)

## 2016-02-10 NOTE — Patient Instructions (Signed)
Your procedure is scheduled on: 02/11/16 Thursday Report to Day Surgery. 2nd floor medical mall entrance at 11:30 To find out your arrival time please call [phone] between 1PM - 3PM on XXXXX.  Remember: Instructions that are not followed completely may result in serious medical risk, up to and including death, or upon the discretion of your surgeon and anesthesiologist your surgery may need to be rescheduled.    __X__ 1. Do not eat food or drink liquids after midnight. No gum chewing or hard candies.     __X__ 2. No Alcohol for 24 hours before or after surgery.   ____ 3. Bring all medications with you on the day of surgery if instructed.    __X__ 4. Notify your doctor if there is any change in your medical condition     (cold, fever, infections).     Do not wear jewelry, make-up, hairpins, clips or nail polish.  Do not wear lotions, powders, or perfumes.   Do not shave 48 hours prior to surgery. Men may shave face and neck.  Do not bring valuables to the hospital.    [facility] is not responsible for any belongings or valuables.               Contacts, dentures or bridgework may not be worn into surgery.  Leave your suitcase in the car. After surgery it may be brought to your room.  For patients admitted to the hospital, discharge time is determined by your                treatment team.   Patients discharged the day of surgery will not be allowed to drive home.   Please read over the following fact sheets that you were given:   Pain Booklet   __x__ Take these medicines the morning of surgery with A SIP OF WATER:    1. amlodipine  2. finasteride  3. tamsulosin  4.  5.  6.  ____ Fleet Enema (as directed)   ____ Use CHG Soap as directed  ____ Use inhalers on the day of surgery  __x_ Stop metformin 2 days prior to surgery    ____ Take 1/2 of usual insulin dose the night before surgery and none on the morning of surgery.   ____ Stop Coumadin/Plavix/aspirin on    ____ Stop Anti-inflammatories on   ____ Stop supplements until after surgery.    ____ Bring C-Pap to the hospital.

## 2016-02-10 NOTE — Pre-Procedure Instructions (Signed)
   ED ECG REPORT I, Doran Stabler, the attending physician, personally viewed and interpreted this ECG.   Date: 11/19/2015  EKG Time: 1107  Rate: 51  Rhythm: sinus bradycardia  Axis: Normal axis  Intervals:none  ST&T Change: T-wave inversion in aVL. Otherwise no ST elevation or depression or abnormal T-wave inversion. Similar to the EKG done on 11/04/2014. No significant change except for the now presents bradycardia

## 2016-02-11 ENCOUNTER — Encounter
Admission: RE | Disposition: A | Discharge status: Home / Self Care | Payer: Self-pay | Source: Ambulatory Visit | Attending: Podiatry

## 2016-02-11 ENCOUNTER — Ambulatory Visit: Payer: Commercial Managed Care - HMO | Admitting: Registered Nurse

## 2016-02-11 ENCOUNTER — Ambulatory Visit
Admission: RE | Admit: 2016-02-11 | Discharge: 2016-02-11 | Disposition: A | Discharge status: Home / Self Care | Payer: Commercial Managed Care - HMO | Source: Ambulatory Visit | Attending: Podiatry | Admitting: Podiatry

## 2016-02-11 DIAGNOSIS — E1142 Type 2 diabetes mellitus with diabetic polyneuropathy: Secondary | ICD-10-CM | POA: Diagnosis not present

## 2016-02-11 DIAGNOSIS — H409 Unspecified glaucoma: Secondary | ICD-10-CM | POA: Diagnosis not present

## 2016-02-11 DIAGNOSIS — E1152 Type 2 diabetes mellitus with diabetic peripheral angiopathy with gangrene: Secondary | ICD-10-CM | POA: Diagnosis not present

## 2016-02-11 DIAGNOSIS — Z7984 Long term (current) use of oral hypoglycemic drugs: Secondary | ICD-10-CM | POA: Insufficient documentation

## 2016-02-11 DIAGNOSIS — Z7982 Long term (current) use of aspirin: Secondary | ICD-10-CM | POA: Diagnosis not present

## 2016-02-11 DIAGNOSIS — Z87891 Personal history of nicotine dependence: Secondary | ICD-10-CM | POA: Insufficient documentation

## 2016-02-11 DIAGNOSIS — I252 Old myocardial infarction: Secondary | ICD-10-CM | POA: Insufficient documentation

## 2016-02-11 DIAGNOSIS — Z953 Presence of xenogenic heart valve: Secondary | ICD-10-CM | POA: Insufficient documentation

## 2016-02-11 DIAGNOSIS — E785 Hyperlipidemia, unspecified: Secondary | ICD-10-CM | POA: Insufficient documentation

## 2016-02-11 DIAGNOSIS — Z79899 Other long term (current) drug therapy: Secondary | ICD-10-CM | POA: Insufficient documentation

## 2016-02-11 DIAGNOSIS — Z8673 Personal history of transient ischemic attack (TIA), and cerebral infarction without residual deficits: Secondary | ICD-10-CM | POA: Insufficient documentation

## 2016-02-11 DIAGNOSIS — Z7902 Long term (current) use of antithrombotics/antiplatelets: Secondary | ICD-10-CM | POA: Diagnosis not present

## 2016-02-11 DIAGNOSIS — I10 Essential (primary) hypertension: Secondary | ICD-10-CM | POA: Insufficient documentation

## 2016-02-11 DIAGNOSIS — Z951 Presence of aortocoronary bypass graft: Secondary | ICD-10-CM | POA: Diagnosis not present

## 2016-02-11 DIAGNOSIS — N400 Enlarged prostate without lower urinary tract symptoms: Secondary | ICD-10-CM | POA: Diagnosis not present

## 2016-02-11 DIAGNOSIS — I2510 Atherosclerotic heart disease of native coronary artery without angina pectoris: Secondary | ICD-10-CM | POA: Insufficient documentation

## 2016-02-11 DIAGNOSIS — I96 Gangrene, not elsewhere classified: Secondary | ICD-10-CM | POA: Diagnosis not present

## 2016-02-11 DIAGNOSIS — E119 Type 2 diabetes mellitus without complications: Secondary | ICD-10-CM | POA: Diagnosis not present

## 2016-02-11 HISTORY — PX: AMPUTATION TOE: SHX6595

## 2016-02-11 LAB — GLUCOSE, CAPILLARY
Glucose-Capillary: 151 mg/dL — ABNORMAL HIGH (ref 65–99)
Glucose-Capillary: 162 mg/dL — ABNORMAL HIGH (ref 65–99)

## 2016-02-11 SURGERY — AMPUTATION, TOE
Anesthesia: General | Laterality: Left

## 2016-02-11 MED ORDER — DEXTROSE 5 % IV SOLN
1.5000 g | Freq: Once | INTRAVENOUS | Status: AC
  Administered 2016-02-11: 2 g via INTRAVENOUS
  Filled 2016-02-11 (×2): qty 15

## 2016-02-11 MED ORDER — FAMOTIDINE 20 MG PO TABS
ORAL_TABLET | ORAL | Status: AC
  Administered 2016-02-11: 20 mg via ORAL
  Filled 2016-02-11: qty 1

## 2016-02-11 MED ORDER — MEPERIDINE HCL 25 MG/ML IJ SOLN: 6.2500 mg | INTRAMUSCULAR | Status: DC | PRN

## 2016-02-11 MED ORDER — FENTANYL CITRATE (PF) 100 MCG/2ML IJ SOLN: 25.0000 ug | INTRAMUSCULAR | Status: DC | PRN

## 2016-02-11 MED ORDER — LIDOCAINE HCL 1 % IJ SOLN
INTRAMUSCULAR | Status: DC | PRN
  Administered 2016-02-11: 2.5 mL

## 2016-02-11 MED ORDER — OXYCODONE HCL 5 MG/5ML PO SOLN: 5.0000 mg | Freq: Once | ORAL | Status: DC | PRN

## 2016-02-11 MED ORDER — FAMOTIDINE 20 MG PO TABS
20.0000 mg | ORAL_TABLET | Freq: Once | ORAL | Status: AC
  Administered 2016-02-11: 20 mg via ORAL

## 2016-02-11 MED ORDER — PROPOFOL 500 MG/50ML IV EMUL
INTRAVENOUS | Status: DC | PRN
  Administered 2016-02-11: 15 ug/kg/min via INTRAVENOUS

## 2016-02-11 MED ORDER — PROPOFOL 10 MG/ML IV BOLUS
INTRAVENOUS | Status: DC | PRN
  Administered 2016-02-11: 40 mg via INTRAVENOUS

## 2016-02-11 MED ORDER — OXYCODONE HCL 5 MG PO TABS: 5.0000 mg | ORAL_TABLET | Freq: Once | ORAL | Status: DC | PRN

## 2016-02-11 MED ORDER — HYDROCODONE-ACETAMINOPHEN 5-325 MG PO TABS: 1.0000 | ORAL_TABLET | Freq: Four times a day (QID) | ORAL | 0 refills | Status: DC | PRN

## 2016-02-11 MED ORDER — BUPIVACAINE HCL 0.5 % IJ SOLN
INTRAMUSCULAR | Status: DC | PRN
  Administered 2016-02-11: 2.5 mL

## 2016-02-11 MED ORDER — SODIUM CHLORIDE 0.9 % IV SOLN
INTRAVENOUS | Status: DC
  Administered 2016-02-11: 12:00:00 via INTRAVENOUS

## 2016-02-11 MED ORDER — PROMETHAZINE HCL 25 MG/ML IJ SOLN: 6.2500 mg | INTRAMUSCULAR | Status: DC | PRN

## 2016-02-11 MED ORDER — BUPIVACAINE HCL (PF) 0.5 % IJ SOLN
INTRAMUSCULAR | Status: AC
  Filled 2016-02-11: qty 30

## 2016-02-11 MED ORDER — LIDOCAINE HCL (PF) 1 % IJ SOLN
INTRAMUSCULAR | Status: AC
  Filled 2016-02-11: qty 30

## 2016-02-11 SURGICAL SUPPLY — 34 items
BANDAGE ELASTIC 4 LF NS (GAUZE/BANDAGES/DRESSINGS) ×3 IMPLANT
BANDAGE STRETCH 3X4.1 STRL (GAUZE/BANDAGES/DRESSINGS) ×3 IMPLANT
BLADE MED AGGRESSIVE (BLADE) IMPLANT
BLADE SURG 15 STRL LF DISP TIS (BLADE) ×4 IMPLANT
BLADE SURG 15 STRL SS (BLADE) ×8
BNDG ESMARK 4X12 TAN STRL LF (GAUZE/BANDAGES/DRESSINGS) ×3 IMPLANT
BNDG GAUZE 4.5X4.1 6PLY STRL (MISCELLANEOUS) ×3 IMPLANT
CANISTER SUCT 1200ML W/VALVE (MISCELLANEOUS) ×3 IMPLANT
CUFF TOURN 18 STER (MISCELLANEOUS) IMPLANT
CUFF TOURN DUAL PL 12 NO SLV (MISCELLANEOUS) IMPLANT
DRAPE FLUOR MINI C-ARM 54X84 (DRAPES) IMPLANT
DURAPREP 26ML APPLICATOR (WOUND CARE) ×3 IMPLANT
ELECT REM PT RETURN 9FT ADLT (ELECTROSURGICAL) ×3
ELECTRODE REM PT RTRN 9FT ADLT (ELECTROSURGICAL) ×1 IMPLANT
GAUZE PETRO XEROFOAM 1X8 (MISCELLANEOUS) ×3 IMPLANT
GAUZE SPONGE 4X4 12PLY STRL (GAUZE/BANDAGES/DRESSINGS) ×3 IMPLANT
GLOVE BIO SURGEON STRL SZ8 (GLOVE) ×3 IMPLANT
GLOVE INDICATOR 7.5 STRL GRN (GLOVE) ×3 IMPLANT
GOWN STRL REUS W/ TWL LRG LVL3 (GOWN DISPOSABLE) ×2 IMPLANT
GOWN STRL REUS W/TWL LRG LVL3 (GOWN DISPOSABLE) ×4
KIT RM TURNOVER STRD PROC AR (KITS) ×3 IMPLANT
LABEL OR SOLS (LABEL) IMPLANT
NDL SAFETY 18GX1.5 (NEEDLE) ×3 IMPLANT
NEEDLE HYPO 25X1 1.5 SAFETY (NEEDLE) ×9 IMPLANT
NS IRRIG 500ML POUR BTL (IV SOLUTION) ×3 IMPLANT
PACK EXTREMITY ARMC (MISCELLANEOUS) ×3 IMPLANT
PENCIL ELECTRO HAND CTR (MISCELLANEOUS) ×3 IMPLANT
RASP SM TEAR CROSS CUT (RASP) IMPLANT
STOCKINETTE STRL 6IN 960660 (GAUZE/BANDAGES/DRESSINGS) ×3 IMPLANT
SUT ETH BLK MONO 3 0 FS 1 12/B (SUTURE) ×3 IMPLANT
SUT ETHILON 5 0 PS 2 18 (SUTURE) ×3 IMPLANT
SUT VIC AB 4-0 FS2 27 (SUTURE) ×3 IMPLANT
SWAB CULTURE AMIES ANAERIB BLU (MISCELLANEOUS) IMPLANT
SYRINGE 10CC LL (SYRINGE) ×3 IMPLANT

## 2016-02-11 NOTE — Anesthesia Postprocedure Evaluation (Signed)
Anesthesia Post Note  Patient: Matthew Bruce  Procedure(s) Performed: Procedure(s) (LRB): AMPUTATION TOE (Left)  Patient location during evaluation: PACU Anesthesia Type: General Level of consciousness: awake and alert and oriented Pain management: pain level controlled Vital Signs Assessment: post-procedure vital signs reviewed and stable Respiratory status: spontaneous breathing, nonlabored ventilation and respiratory function stable Cardiovascular status: blood pressure returned to baseline and stable Postop Assessment: no signs of nausea or vomiting Anesthetic complications: no    Last Vitals:  Vitals:   02/11/16 1449 02/11/16 1500  BP: (!) 159/66 (!) 160/66  Pulse: (!) 54 (!) 59  Resp: 12 14  Temp: 36.7 C     Last Pain:  Vitals:   02/11/16 1151  TempSrc: Tympanic                 [name]

## 2016-02-11 NOTE — Transfer of Care (Signed)
Immediate Anesthesia Transfer of Care Note  Patient: Matthew Bruce  Procedure(s) Performed: Procedure(s): AMPUTATION TOE (Left)  Patient Location: PACU  Anesthesia Type:General  Level of Consciousness: sedated  Airway & Oxygen Therapy: Patient Spontanous Breathing and Patient connected to face mask oxygen  Post-op Assessment: Report given to RN and Post -op Vital signs reviewed and stable  Post vital signs: Reviewed and stable  Last Vitals:  Vitals:   02/11/16 1151 02/11/16 1405  BP: (!) 142/72 (!) 170/67  Pulse: 90   Resp: 14   Temp: 36.6 C 31.1 C    Complications: No apparent anesthesia complications

## 2016-02-11 NOTE — Anesthesia Preprocedure Evaluation (Addendum)
Anesthesia Evaluation  Patient identified by MRN, date of birth, ID band Patient awake    Reviewed: Allergy & Precautions, NPO status , Patient's Chart, lab work & pertinent test results  History of Anesthesia Complications Negative for: history of anesthetic complications  Airway Mallampati: II  TM Distance: >3 FB Neck ROM: Full    Dental  (+) Poor Dentition, Missing   Pulmonary neg shortness of breath, neg sleep apnea, neg COPD, former smoker,    breath sounds clear to auscultation- rhonchi (-) wheezing      Cardiovascular hypertension, Pt. on medications (-) angina+ CAD, + Past MI and + CABG (3v CABG 09/2005)  + Valvular Problems/Murmurs (hx of AVR 09/2005)  Rhythm:Regular Rate:Normal - Systolic murmurs and - Diastolic murmurs Echo 09/05/82: - Left ventricle: The cavity size was normal. There was moderate   concentric hypertrophy. Systolic function was normal. Wall motion   was normal; there were no regional wall motion abnormalities.   Doppler parameters are consistent with abnormal left ventricular   relaxation (grade 1 diastolic dysfunction). - Aortic valve: A bioprosthesis was present. Transvalvular velocity   was increased. There was mild to moderate stenosis. Mean gradient   (S): 19 mm Hg. Peak gradient (S): 34 mm Hg. Valve area (VTI):   0.94 cm^2. - Mitral valve: Calcified annulus. There was mild regurgitation. - Left atrium: The atrium was mildly dilated. - Right ventricle: Systolic function was normal. - Pulmonary arteries: Systolic pressure was within the normal   range.   Neuro/Psych CVA negative psych ROS   GI/Hepatic negative GI ROS, Neg liver ROS,   Endo/Other  diabetes, Type 2, Oral Hypoglycemic Agents  Renal/GU Renal disease: hx of nephrolithiasis.     Musculoskeletal negative musculoskeletal ROS (+)   Abdominal (+) - obese,   Peds  Hematology  (+) anemia ,   Anesthesia Other Findings Past  Medical History: No date: Anemia No date: Coronary artery disease No date: Diabetes mellitus without complication (HCC) No date: Glaucoma No date: Hyperlipidemia No date: Hypertension No date: Kidney stones No date: Myocardial infarction No date: Peripheral vascular disease (HCC)   Reproductive/Obstetrics                            Anesthesia Physical Anesthesia Plan  ASA: III  Anesthesia Plan: General   Post-op Pain Management:    Induction: Intravenous  Airway Management Planned: Natural Airway  Additional Equipment:   Intra-op Plan:   Post-operative Plan:   Informed Consent: I have reviewed the patients History and Physical, chart, labs and discussed the procedure including the risks, benefits and alternatives for the proposed anesthesia with the patient or authorized representative who has indicated his/her understanding and acceptance.   Dental advisory given  Plan Discussed with: CRNA and Anesthesiologist  Anesthesia Plan Comments:         Anesthesia Quick Evaluation

## 2016-02-11 NOTE — Op Note (Signed)
Operative note   Surgeon: Dr. Albertine Patricia, DPM.    Assistant: None    Preop diagnosis: Gangrene third toe left foot    Postop diagnosis: Same    Procedure:   1. Amputation third toe left foot to the MTPJ level          EBL: 15 cc    Anesthesia:IV sedation delivered by anesthesia team and local anesthetic blocked at the base of the toe delivered by me including 3-1/2 cc of 0.5% Marcaine plain mixed half-and-half with 1% lidocaine plain    Hemostasis: None    Specimen: Gangrenous third toe    Complications: None    Operative indications: Gangrene to the third toe    Procedure:  Patient was brought into the OR and placed on the operating table in thesupine position. After anesthesia was obtained theleft lower extremity was prepped and draped in usual sterile fashion.  Operative Report: At this time attention was directed to the third toe of the left foot. The toe was gangrenous from the base of the toe to the end. There was some good skin and soft tissue available just distal to the metatarsal head. 2 semielliptical incisions were made around the toe staying in the good stable healthy tissue. This was from dorsally to lateral plantar and plantar medial to dorsal. This incision was deepened down to the bone and deep soft tissue. This point capsular and tendinous tissue was released at the MTPJ level and the toe was removed in toto. This was sent for pathology. After copious irrigation the areas checked for bleeders which were clamped and bovied as necessary. After further irrigation the deep tissue was closed with 4-0 Vicryl in simple interrupted sutures. Skin was closed with 4-0 nylon in combination of horizontal mattress and simple interrupted sutures. A sterile compressive dressings placed across went this time consisting of Xeroform gauze 4 x 4's Kling Kerlix.    Patient tolerated the procedure and anesthesia well.  Was transported from the OR to the PACU with all vital signs  stable and vascular status intact. To be discharged per routine protocol.  Will follow up in approximately 1 week in the outpatient clinic.

## 2016-02-11 NOTE — H&P (Signed)
H and P has been reviewed and no changes are noted.  

## 2016-02-11 NOTE — Discharge Instructions (Signed)
AMBULATORY SURGERY  DISCHARGE INSTRUCTIONS   1) The drugs that you were given will stay in your system until tomorrow so for the next 24 hours you should not:  A) Drive an automobile B) Make any legal decisions C) Drink any alcoholic beverage   2) You may resume regular meals tomorrow.  Today it is better to start with liquids and gradually work up to solid foods.  You may eat anything you prefer, but it is better to start with liquids, then soup and crackers, and gradually work up to solid foods.   3) Please notify your doctor immediately if you have any unusual bleeding, trouble breathing, redness and pain at the surgery site, drainage, fever, or pain not relieved by medication. 4)   5) Your post-operative visit with Dr.                                     is: Date:                        Time:    Please call to schedule your post-operative visit.  6) Additional Instructions:     [place]  POST OPERATIVE INSTRUCTIONS FOR DR. [place]   1. Take your medication as prescribed.  Pain medication should be taken only as needed.  2. Keep the dressing clean, dry and intact.  3. Keep your foot elevated no higher than heart level for the first 48 hours.  4. Walking to the bathroom and brief periods of walking are acceptable, unless we have instructed you to be non-weight bearing.  5. Always wear your post-op shoe when walking.  Always use your crutches if you are to be non-weight bearing.  6. Do not take a shower unless you have a shower cast protector.. Baths are permissible as long as the foot is kept out of the water. Shower protectors can be found in medical supply stores to place across the foot and leg.  7. Every hour you are awake:  - Bend your knee 15 times. - Flex foot 15 times - Massage calf 15 times  8. Call [facility] [phone]) if any of the following  problems occur: - You develop a temperature or fever. - The bandage becomes saturated with blood. - Medication does not stop your pain. - Injury of the foot occurs. - Any symptoms of infection including redness, odor, or red streaks running from wound. -

## 2016-02-15 LAB — SURGICAL PATHOLOGY

## 2016-02-16 ENCOUNTER — Encounter: Payer: Self-pay | Admitting: Podiatry

## 2016-03-11 ENCOUNTER — Encounter (INDEPENDENT_AMBULATORY_CARE_PROVIDER_SITE_OTHER): Payer: Commercial Managed Care - HMO | Admitting: Vascular Surgery

## 2016-03-11 ENCOUNTER — Encounter (INDEPENDENT_AMBULATORY_CARE_PROVIDER_SITE_OTHER): Payer: Commercial Managed Care - HMO

## 2016-03-18 DIAGNOSIS — L03119 Cellulitis of unspecified part of limb: Secondary | ICD-10-CM | POA: Diagnosis not present

## 2016-03-18 DIAGNOSIS — E1159 Type 2 diabetes mellitus with other circulatory complications: Secondary | ICD-10-CM | POA: Diagnosis not present

## 2016-03-18 DIAGNOSIS — L02619 Cutaneous abscess of unspecified foot: Secondary | ICD-10-CM | POA: Diagnosis not present

## 2016-03-18 DIAGNOSIS — E1142 Type 2 diabetes mellitus with diabetic polyneuropathy: Secondary | ICD-10-CM | POA: Diagnosis not present

## 2016-03-18 DIAGNOSIS — I96 Gangrene, not elsewhere classified: Secondary | ICD-10-CM | POA: Diagnosis not present

## 2016-03-20 DIAGNOSIS — Z9181 History of falling: Secondary | ICD-10-CM | POA: Diagnosis not present

## 2016-03-20 DIAGNOSIS — T8781 Dehiscence of amputation stump: Secondary | ICD-10-CM | POA: Diagnosis not present

## 2016-03-20 DIAGNOSIS — Z7984 Long term (current) use of oral hypoglycemic drugs: Secondary | ICD-10-CM | POA: Diagnosis not present

## 2016-03-20 DIAGNOSIS — Z792 Long term (current) use of antibiotics: Secondary | ICD-10-CM | POA: Diagnosis not present

## 2016-03-20 DIAGNOSIS — Z7902 Long term (current) use of antithrombotics/antiplatelets: Secondary | ICD-10-CM | POA: Diagnosis not present

## 2016-03-22 DIAGNOSIS — L97522 Non-pressure chronic ulcer of other part of left foot with fat layer exposed: Secondary | ICD-10-CM | POA: Diagnosis not present

## 2016-03-22 DIAGNOSIS — E1142 Type 2 diabetes mellitus with diabetic polyneuropathy: Secondary | ICD-10-CM | POA: Diagnosis not present

## 2016-03-22 DIAGNOSIS — E1159 Type 2 diabetes mellitus with other circulatory complications: Secondary | ICD-10-CM | POA: Diagnosis not present

## 2016-03-24 ENCOUNTER — Ambulatory Visit (INDEPENDENT_AMBULATORY_CARE_PROVIDER_SITE_OTHER): Payer: Commercial Managed Care - HMO | Admitting: Vascular Surgery

## 2016-03-24 ENCOUNTER — Encounter (INDEPENDENT_AMBULATORY_CARE_PROVIDER_SITE_OTHER): Payer: Self-pay | Admitting: Vascular Surgery

## 2016-03-24 VITALS — BP 102/56 | HR 101 | Resp 16 | Ht 72.0 in | Wt 201.0 lb

## 2016-03-24 DIAGNOSIS — I70262 Atherosclerosis of native arteries of extremities with gangrene, left leg: Secondary | ICD-10-CM | POA: Diagnosis not present

## 2016-03-24 DIAGNOSIS — L97523 Non-pressure chronic ulcer of other part of left foot with necrosis of muscle: Secondary | ICD-10-CM

## 2016-03-24 DIAGNOSIS — I739 Peripheral vascular disease, unspecified: Secondary | ICD-10-CM | POA: Diagnosis not present

## 2016-03-24 DIAGNOSIS — L97519 Non-pressure chronic ulcer of other part of right foot with unspecified severity: Secondary | ICD-10-CM | POA: Insufficient documentation

## 2016-03-24 NOTE — Progress Notes (Signed)
Subjective:    Patient ID: Matthew Bruce, male    DOB: 17-May-1926, 82 y.o.   MRN: [id number] Chief Complaint  Patient presents with  . Wound Check   Patient presents at the request of Dr. Vickki Muff for a non-healing third toe amputation site on the left foot. Wound has been present for about six weeks. Patient with PMHx of DM. Just started to receive local wound care from home nursing services. No claudication or rest pain. No fever, nausea or vomiting.    Review of Systems  Constitutional: Negative.   HENT: Negative.   Eyes: Negative.   Respiratory: Negative.   Cardiovascular: Negative.   Gastrointestinal: Negative.   Endocrine: Negative.   Genitourinary: Negative.   Musculoskeletal: Negative.   Skin: Positive for wound (Left Foot Wound).  Allergic/Immunologic: Negative.   Neurological: Negative.   Hematological: Negative.   Psychiatric/Behavioral: Negative.       Objective:   Physical Exam  Constitutional: He is oriented to person, place, and time. He appears well-developed and well-nourished.  HENT:  Head: Normocephalic and atraumatic.  Left Ear: External ear normal.  Eyes: Conjunctivae and EOM are normal. Pupils are equal, round, and reactive to light.  Neck: Normal range of motion.  Cardiovascular: Normal rate, regular rhythm and normal heart sounds.   Pulses:      Radial pulses are 2+ on the right side, and 2+ on the left side.       Dorsalis pedis pulses are 0 on the right side, and 1+ on the left side.       Posterior tibial pulses are 0 on the right side, and 1+ on the left side.  Hard to appreciate pedal pulses on left due to moderate edema of foot. Extremity warm.   Pulmonary/Chest: Effort normal.  Abdominal: Soft. Bowel sounds are normal.  Musculoskeletal: Normal range of motion. He exhibits edema (Left Foot Moderate Edema).  Neurological: He is alert and oriented to person, place, and time.  Skin:  Left Toe Amputation Site: moderate fibrinous exudate in  wound. Minimal drainage. No infection or cellulitis.   Psychiatric: He has a normal mood and affect. His behavior is normal. Judgment and thought content normal.   BP (!) 102/56   Pulse (!) 101   Resp 16   Ht 6' (1.829 m)   Wt 201 lb (91.2 kg)   BMI 27.26 kg/m   Past Medical History:  Diagnosis Date  . Anemia   . Coronary artery disease   . Diabetes mellitus without complication ([place])   . Glaucoma   . Hyperlipidemia   . Hypertension   . Kidney stones   . Myocardial infarction   . Peripheral vascular disease [facility])    Social History   Social History  . Marital status: Married    Spouse name: N/A  . Number of children: N/A  . Years of education: N/A   Occupational History  . Not on file.   Social History Main Topics  . Smoking status: Former Smoker    Types: Cigarettes  . Smokeless tobacco: Current User    Types: Chew  . Alcohol use No  . Drug use: No  . Sexual activity: No   Other Topics Concern  . Not on file   Social History Narrative  . No narrative on file   Past Surgical History:  Procedure Laterality Date  . AMPUTATION TOE Left 02/11/2016   Procedure: AMPUTATION TOE;  Surgeon: Albertine Patricia, DPM;  Location: [facility] ORS;  Service: Podiatry;  Laterality: Left;  . CARDIAC SURGERY    . CARDIAC VALVE REPLACEMENT    . CORONARY ARTERY BYPASS GRAFT    . PERIPHERAL VASCULAR CATHETERIZATION Left 02/03/2016   Procedure: Lower Extremity Angiography;  Surgeon: Algernon Huxley, MD;  Location: [place] [facility];  Service: Cardiovascular;  Laterality: Left;  Marland Kitchen VALVE REPLACEMENT     Family History  Problem Relation Age of Onset  . Heart failure Neg Hx    Allergies  Allergen Reactions  . Shellfish Allergy Hives    And rash      Assessment & Plan:  Patient presents at the request of Dr. Vickki Muff for a non-healing third toe amputation site on the left foot. Wound has been present for about six weeks. Patient with PMHx of DM. Just started to receive local wound care  from home nursing services. No claudication or rest pain. No fever, nausea or vomiting.   1. PAD (peripheral artery disease) (HCC) - Worsening PAD vs DM causing slow healing of amputation site. Will order arterial duplex ASAP to assess blood flow. Santyl to wound daily. Continue doxycyline as prescribed by [facility]  - VAS [place] LOWER EXTREMITY ARTERIAL DUPLEX; Future  2. Atherosclerosis of native artery of left lower extremity with gangrene (HCC) - Improved Gangranous toe removed. Surgical site slow to heal but not infected. Awaiting arterial duplex.  3. Foot ulceration, left, with necrosis of muscle (HCC) - New Awaiting arterial duplex. Continue ABX. Start Santyl to wound daily.  Current Outpatient Prescriptions on File Prior to Visit  Medication Sig Dispense Refill  . amLODipine (NORVASC) 5 MG tablet Take 1 tablet (5 mg total) by mouth daily. 30 tablet 0  . Cholecalciferol 1000 UNITS capsule Take 1,000 Units by mouth daily.    . clopidogrel (PLAVIX) 75 MG tablet Take 75 mg by mouth daily.    . dorzolamide (TRUSOPT) 2 % ophthalmic solution Place 1 drop into both eyes 2 (two) times daily.    . finasteride (PROSCAR) 5 MG tablet Take 5 mg by mouth daily.    Marland Kitchen glipiZIDE (GLUCOTROL) 5 MG tablet Take 5 mg by mouth daily before breakfast.    . HYDROcodone-acetaminophen (NORCO) 5-325 MG tablet Take 1 tablet by mouth every 6 (six) hours as needed for moderate pain. 30 tablet 0  . ibuprofen (ADVIL,MOTRIN) 800 MG tablet Take 800 mg by mouth every 8 (eight) hours as needed.    . latanoprost (XALATAN) 0.005 % ophthalmic solution Place 1 drop into both eyes at bedtime.    . lovastatin (ALTOPREV) 40 MG 24 hr tablet Take 40 mg by mouth at bedtime.    . meclizine (ANTIVERT) 25 MG tablet Take 25 mg by mouth 4 (four) times daily as needed for dizziness.    . metFORMIN (GLUCOPHAGE) 1000 MG tablet Take 1,000 mg by mouth 2 (two) times daily with a meal.    . Multiple Vitamin (MULTIVITAMIN WITH MINERALS)  TABS tablet Take 1 tablet by mouth daily.    . tamsulosin (FLOMAX) 0.4 MG CAPS capsule TAKE 1 CAPSULE ONE TIME DAILY    . verapamil (CALAN-SR) 180 MG CR tablet Take 180 mg by mouth at bedtime.     No current facility-administered medications on file prior to visit.     There are no Patient Instructions on file for this visit. No Follow-up on file.   Mairen Wallenstein A Ajanee Buren, PA-C

## 2016-03-29 DIAGNOSIS — L97522 Non-pressure chronic ulcer of other part of left foot with fat layer exposed: Secondary | ICD-10-CM | POA: Diagnosis not present

## 2016-03-29 DIAGNOSIS — E1142 Type 2 diabetes mellitus with diabetic polyneuropathy: Secondary | ICD-10-CM | POA: Diagnosis not present

## 2016-03-29 DIAGNOSIS — E1159 Type 2 diabetes mellitus with other circulatory complications: Secondary | ICD-10-CM | POA: Diagnosis not present

## 2016-03-30 ENCOUNTER — Encounter
Admission: RE | Admit: 2016-03-30 | Discharge: 2016-03-30 | Disposition: A | Discharge status: Home / Self Care | Payer: Commercial Managed Care - HMO | Source: Ambulatory Visit | Attending: Podiatry | Admitting: Podiatry

## 2016-03-30 DIAGNOSIS — Z79899 Other long term (current) drug therapy: Secondary | ICD-10-CM | POA: Diagnosis not present

## 2016-03-30 DIAGNOSIS — I252 Old myocardial infarction: Secondary | ICD-10-CM | POA: Diagnosis not present

## 2016-03-30 DIAGNOSIS — L97529 Non-pressure chronic ulcer of other part of left foot with unspecified severity: Secondary | ICD-10-CM | POA: Diagnosis not present

## 2016-03-30 DIAGNOSIS — I10 Essential (primary) hypertension: Secondary | ICD-10-CM | POA: Diagnosis not present

## 2016-03-30 DIAGNOSIS — I70201 Unspecified atherosclerosis of native arteries of extremities, right leg: Secondary | ICD-10-CM | POA: Diagnosis not present

## 2016-03-30 DIAGNOSIS — Y835 Amputation of limb(s) as the cause of abnormal reaction of the patient, or of later complication, without mention of misadventure at the time of the procedure: Secondary | ICD-10-CM | POA: Diagnosis not present

## 2016-03-30 DIAGNOSIS — E1140 Type 2 diabetes mellitus with diabetic neuropathy, unspecified: Secondary | ICD-10-CM | POA: Diagnosis not present

## 2016-03-30 DIAGNOSIS — Z89422 Acquired absence of other left toe(s): Secondary | ICD-10-CM | POA: Diagnosis not present

## 2016-03-30 DIAGNOSIS — Z951 Presence of aortocoronary bypass graft: Secondary | ICD-10-CM | POA: Diagnosis not present

## 2016-03-30 DIAGNOSIS — I70245 Atherosclerosis of native arteries of left leg with ulceration of other part of foot: Secondary | ICD-10-CM | POA: Diagnosis not present

## 2016-03-30 DIAGNOSIS — Z953 Presence of xenogenic heart valve: Secondary | ICD-10-CM | POA: Diagnosis not present

## 2016-03-30 DIAGNOSIS — N400 Enlarged prostate without lower urinary tract symptoms: Secondary | ICD-10-CM | POA: Diagnosis not present

## 2016-03-30 DIAGNOSIS — E785 Hyperlipidemia, unspecified: Secondary | ICD-10-CM | POA: Diagnosis not present

## 2016-03-30 DIAGNOSIS — E11621 Type 2 diabetes mellitus with foot ulcer: Secondary | ICD-10-CM | POA: Diagnosis not present

## 2016-03-30 DIAGNOSIS — H409 Unspecified glaucoma: Secondary | ICD-10-CM | POA: Diagnosis not present

## 2016-03-30 DIAGNOSIS — Z87891 Personal history of nicotine dependence: Secondary | ICD-10-CM | POA: Diagnosis not present

## 2016-03-30 DIAGNOSIS — I2510 Atherosclerotic heart disease of native coronary artery without angina pectoris: Secondary | ICD-10-CM | POA: Diagnosis not present

## 2016-03-30 DIAGNOSIS — T8781 Dehiscence of amputation stump: Secondary | ICD-10-CM | POA: Diagnosis not present

## 2016-03-30 HISTORY — DX: Unspecified hearing loss, unspecified ear: H91.90

## 2016-03-30 HISTORY — DX: Personal history of other diseases of male genital organs: Z87.438

## 2016-03-30 HISTORY — DX: Nonrheumatic aortic valve disorder, unspecified: I35.9

## 2016-03-30 HISTORY — DX: Personal history of urinary calculi: Z87.442

## 2016-03-30 HISTORY — DX: Polyneuropathy, unspecified: G62.9

## 2016-03-30 LAB — CBC
HCT: 35.8 % — ABNORMAL LOW (ref 40.0–52.0)
Hemoglobin: 12.2 g/dL — ABNORMAL LOW (ref 13.0–18.0)
MCH: 30.1 pg (ref 26.0–34.0)
MCHC: 34 g/dL (ref 32.0–36.0)
MCV: 88.3 fL (ref 80.0–100.0)
Platelets: 404 K/uL (ref 150–440)
RBC: 4.06 MIL/uL — ABNORMAL LOW (ref 4.40–5.90)
RDW: 13.7 % (ref 11.5–14.5)
WBC: 10.4 K/uL (ref 3.8–10.6)

## 2016-03-30 LAB — BASIC METABOLIC PANEL
ANION GAP: 8 (ref 5–15)
BUN: 22 mg/dL — AB (ref 6–20)
CHLORIDE: 104 mmol/L (ref 101–111)
CO2: 23 mmol/L (ref 22–32)
Calcium: 9.8 mg/dL (ref 8.9–10.3)
Creatinine, Ser: 1.32 mg/dL — ABNORMAL HIGH (ref 0.61–1.24)
GFR calc Af Amer: 54 mL/min — ABNORMAL LOW (ref >60)
GFR, EST NON AFRICAN AMERICAN: 47 mL/min — AB (ref >60)
GLUCOSE: 174 mg/dL — AB (ref 65–99)
POTASSIUM: 4.5 mmol/L (ref 3.5–5.1)
Sodium: 135 mmol/L (ref 135–145)

## 2016-03-30 NOTE — Patient Instructions (Signed)
  Your procedure is scheduled on: April 01, 2016 (Friday) Report to Same Day Surgery 2nd floor medical mall [facility] Entrance-take elevator on left to 2nd floor.  Check in with surgery information desk.) To find out your arrival time please call [phone] between 1PM - 3PM on March 31, 2016 (Thursday)  Remember: Instructions that are not followed completely may result in serious medical risk, up to and including death, or upon the discretion of your surgeon and anesthesiologist your surgery may need to be rescheduled.    _x___ 1. Do not eat food or drink liquids after midnight. No gum chewing or hard candies.     __x__ 2. No Alcohol for 24 hours before or after surgery.   __x__3. No Smoking for 24 prior to surgery.   ____  4. Bring all medications with you on the day of surgery if instructed.    __x__ 5. Notify your doctor if there is any change in your medical condition     (cold, fever, infections).     Do not wear jewelry, make-up, hairpins, clips or nail polish.  Do not wear lotions, powders, or perfumes. You may wear deodorant.  Do not shave 48 hours prior to surgery. Men may shave face and neck.  Do not bring valuables to the hospital.    [facility] is not responsible for any belongings or valuables.               Contacts, dentures or bridgework may not be worn into surgery.  Leave your suitcase in the car. After surgery it may be brought to your room.  For patients admitted to the hospital, discharge time is determined by your treatment team.   Patients discharged the day of surgery will not be allowed to drive home.  You will need someone to drive you home and stay with you the night of your procedure.    Please read over the following fact sheets that you were given:   [facility] Preparing for Surgery and or MRSA Information   _x___ Take these medicines the morning of surgery with A SIP OF WATER:    1. Lovastatin  2.  3.  4.  5.  6.  ____Fleets  enema or Magnesium Citrate as directed.   ___ Use CHG Soap or sage wipes as directed on instruction sheet   ____ Use inhalers on the day of surgery and bring to hospital day of surgery  __x__ Stop metformin 2 days prior to surgery (STOP METFORMIN TODAY)    ____ Take 1/2 of usual insulin dose the night before surgery and none on the morning of           surgery.   __x__ Stop Aspirin, Coumadin, Plavix ,Eliquis, Effient, or Pradaxa (DR TROXLER OFFICE TO CALL PATIENT REGARDING WHEN TO STOP PLAVIX)  x__ Stop Anti-inflammatories such as Advil, Aleve, Ibuprofen, Motrin, Naproxen,          Naprosyn, Goodies powders or aspirin products. Ok to take Tylenol.   ____ Stop supplements until after surgery.    ____ Bring C-Pap to the hospital.

## 2016-03-31 DIAGNOSIS — E1159 Type 2 diabetes mellitus with other circulatory complications: Secondary | ICD-10-CM | POA: Diagnosis not present

## 2016-03-31 DIAGNOSIS — E1142 Type 2 diabetes mellitus with diabetic polyneuropathy: Secondary | ICD-10-CM | POA: Diagnosis not present

## 2016-03-31 DIAGNOSIS — L97522 Non-pressure chronic ulcer of other part of left foot with fat layer exposed: Secondary | ICD-10-CM | POA: Diagnosis not present

## 2016-03-31 DIAGNOSIS — I70262 Atherosclerosis of native arteries of extremities with gangrene, left leg: Secondary | ICD-10-CM | POA: Diagnosis not present

## 2016-04-01 ENCOUNTER — Ambulatory Visit: Payer: Commercial Managed Care - HMO | Admitting: Anesthesiology

## 2016-04-01 ENCOUNTER — Encounter
Admission: RE | Disposition: A | Discharge status: Home / Self Care | Payer: Self-pay | Source: Ambulatory Visit | Attending: Podiatry

## 2016-04-01 ENCOUNTER — Ambulatory Visit
Admission: RE | Admit: 2016-04-01 | Discharge: 2016-04-01 | Disposition: A | Discharge status: Home / Self Care | Payer: Commercial Managed Care - HMO | Source: Ambulatory Visit | Attending: Podiatry | Admitting: Podiatry

## 2016-04-01 DIAGNOSIS — N400 Enlarged prostate without lower urinary tract symptoms: Secondary | ICD-10-CM | POA: Diagnosis not present

## 2016-04-01 DIAGNOSIS — I252 Old myocardial infarction: Secondary | ICD-10-CM | POA: Insufficient documentation

## 2016-04-01 DIAGNOSIS — S91302A Unspecified open wound, left foot, initial encounter: Secondary | ICD-10-CM | POA: Diagnosis not present

## 2016-04-01 DIAGNOSIS — Z87891 Personal history of nicotine dependence: Secondary | ICD-10-CM | POA: Insufficient documentation

## 2016-04-01 DIAGNOSIS — I2510 Atherosclerotic heart disease of native coronary artery without angina pectoris: Secondary | ICD-10-CM | POA: Diagnosis not present

## 2016-04-01 DIAGNOSIS — H409 Unspecified glaucoma: Secondary | ICD-10-CM | POA: Insufficient documentation

## 2016-04-01 DIAGNOSIS — I70201 Unspecified atherosclerosis of native arteries of extremities, right leg: Secondary | ICD-10-CM | POA: Insufficient documentation

## 2016-04-01 DIAGNOSIS — E119 Type 2 diabetes mellitus without complications: Secondary | ICD-10-CM | POA: Diagnosis not present

## 2016-04-01 DIAGNOSIS — E11621 Type 2 diabetes mellitus with foot ulcer: Secondary | ICD-10-CM | POA: Diagnosis not present

## 2016-04-01 DIAGNOSIS — S98912A Complete traumatic amputation of left foot, level unspecified, initial encounter: Secondary | ICD-10-CM | POA: Diagnosis not present

## 2016-04-01 DIAGNOSIS — D649 Anemia, unspecified: Secondary | ICD-10-CM | POA: Diagnosis not present

## 2016-04-01 DIAGNOSIS — I739 Peripheral vascular disease, unspecified: Secondary | ICD-10-CM | POA: Diagnosis not present

## 2016-04-01 DIAGNOSIS — Z951 Presence of aortocoronary bypass graft: Secondary | ICD-10-CM | POA: Insufficient documentation

## 2016-04-01 DIAGNOSIS — Z22322 Carrier or suspected carrier of Methicillin resistant Staphylococcus aureus: Secondary | ICD-10-CM

## 2016-04-01 DIAGNOSIS — L97529 Non-pressure chronic ulcer of other part of left foot with unspecified severity: Secondary | ICD-10-CM | POA: Insufficient documentation

## 2016-04-01 DIAGNOSIS — L97522 Non-pressure chronic ulcer of other part of left foot with fat layer exposed: Secondary | ICD-10-CM | POA: Diagnosis not present

## 2016-04-01 DIAGNOSIS — Z953 Presence of xenogenic heart valve: Secondary | ICD-10-CM | POA: Insufficient documentation

## 2016-04-01 DIAGNOSIS — E785 Hyperlipidemia, unspecified: Secondary | ICD-10-CM | POA: Insufficient documentation

## 2016-04-01 DIAGNOSIS — Z89422 Acquired absence of other left toe(s): Secondary | ICD-10-CM | POA: Insufficient documentation

## 2016-04-01 DIAGNOSIS — I70245 Atherosclerosis of native arteries of left leg with ulceration of other part of foot: Secondary | ICD-10-CM | POA: Diagnosis not present

## 2016-04-01 DIAGNOSIS — E1140 Type 2 diabetes mellitus with diabetic neuropathy, unspecified: Secondary | ICD-10-CM | POA: Insufficient documentation

## 2016-04-01 DIAGNOSIS — Y835 Amputation of limb(s) as the cause of abnormal reaction of the patient, or of later complication, without mention of misadventure at the time of the procedure: Secondary | ICD-10-CM | POA: Insufficient documentation

## 2016-04-01 DIAGNOSIS — Z79899 Other long term (current) drug therapy: Secondary | ICD-10-CM | POA: Insufficient documentation

## 2016-04-01 DIAGNOSIS — I25810 Atherosclerosis of coronary artery bypass graft(s) without angina pectoris: Secondary | ICD-10-CM | POA: Diagnosis not present

## 2016-04-01 DIAGNOSIS — I10 Essential (primary) hypertension: Secondary | ICD-10-CM | POA: Insufficient documentation

## 2016-04-01 DIAGNOSIS — I70248 Atherosclerosis of native arteries of left leg with ulceration of other part of lower left leg: Secondary | ICD-10-CM | POA: Diagnosis not present

## 2016-04-01 DIAGNOSIS — T8781 Dehiscence of amputation stump: Secondary | ICD-10-CM | POA: Insufficient documentation

## 2016-04-01 HISTORY — PX: IRRIGATION AND DEBRIDEMENT FOOT: SHX6602

## 2016-04-01 HISTORY — PX: APPLICATION OF WOUND VAC: SHX5189

## 2016-04-01 HISTORY — PX: PERIPHERAL VASCULAR CATHETERIZATION: SHX172C

## 2016-04-01 HISTORY — DX: Carrier or suspected carrier of methicillin resistant Staphylococcus aureus: Z22.322

## 2016-04-01 LAB — GLUCOSE, CAPILLARY
GLUCOSE-CAPILLARY: 131 mg/dL — AB (ref 65–99)
GLUCOSE-CAPILLARY: 150 mg/dL — AB (ref 65–99)

## 2016-04-01 SURGERY — IRRIGATION AND DEBRIDEMENT FOOT
Anesthesia: General | Laterality: Left

## 2016-04-01 SURGERY — LOWER EXTREMITY ANGIOGRAPHY
Anesthesia: Moderate Sedation | Laterality: Left

## 2016-04-01 MED ORDER — LIDOCAINE HCL (PF) 1 % IJ SOLN
INTRAMUSCULAR | Status: AC
  Filled 2016-04-01: qty 30

## 2016-04-01 MED ORDER — BUPIVACAINE HCL (PF) 0.5 % IJ SOLN
INTRAMUSCULAR | Status: DC | PRN
  Administered 2016-04-01: 8 mL

## 2016-04-01 MED ORDER — SEVOFLURANE IN SOLN
RESPIRATORY_TRACT | Status: AC
  Filled 2016-04-01: qty 250

## 2016-04-01 MED ORDER — PROPOFOL 500 MG/50ML IV EMUL
INTRAVENOUS | Status: DC | PRN
  Administered 2016-04-01: 30 ug/kg/min via INTRAVENOUS

## 2016-04-01 MED ORDER — MIDAZOLAM HCL 2 MG/2ML IJ SOLN
INTRAMUSCULAR | Status: DC | PRN
  Administered 2016-04-01 (×2): 1 mg via INTRAVENOUS

## 2016-04-01 MED ORDER — FAMOTIDINE 20 MG PO TABS
ORAL_TABLET | ORAL | Status: AC
  Administered 2016-04-01: 20 mg via ORAL
  Filled 2016-04-01: qty 1

## 2016-04-01 MED ORDER — MIDAZOLAM HCL 2 MG/2ML IJ SOLN
INTRAMUSCULAR | Status: AC
  Filled 2016-04-01: qty 2

## 2016-04-01 MED ORDER — FENTANYL CITRATE (PF) 100 MCG/2ML IJ SOLN
INTRAMUSCULAR | Status: DC | PRN
  Administered 2016-04-01 (×2): 25 ug via INTRAVENOUS

## 2016-04-01 MED ORDER — FENTANYL CITRATE (PF) 100 MCG/2ML IJ SOLN: 25.0000 ug | INTRAMUSCULAR | Status: DC | PRN

## 2016-04-01 MED ORDER — FAMOTIDINE 20 MG PO TABS
20.0000 mg | ORAL_TABLET | Freq: Once | ORAL | Status: AC
  Administered 2016-04-01: 20 mg via ORAL

## 2016-04-01 MED ORDER — SODIUM CHLORIDE 0.9 % IV SOLN
INTRAVENOUS | Status: DC | PRN
  Administered 2016-04-01: 08:00:00 via INTRAVENOUS

## 2016-04-01 MED ORDER — MIDAZOLAM HCL 2 MG/2ML IJ SOLN
INTRAMUSCULAR | Status: AC
  Filled 2016-04-01: qty 4

## 2016-04-01 MED ORDER — PHENYLEPHRINE HCL 10 MG/ML IJ SOLN
INTRAMUSCULAR | Status: AC
  Filled 2016-04-01: qty 1

## 2016-04-01 MED ORDER — HEPARIN SODIUM (PORCINE) 1000 UNIT/ML IJ SOLN
INTRAMUSCULAR | Status: AC
  Filled 2016-04-01: qty 1

## 2016-04-01 MED ORDER — MIDAZOLAM HCL 2 MG/2ML IJ SOLN
INTRAMUSCULAR | Status: DC | PRN
  Administered 2016-04-01: 1 mg via INTRAVENOUS

## 2016-04-01 MED ORDER — ONDANSETRON HCL 4 MG/2ML IJ SOLN: 4.0000 mg | Freq: Once | INTRAMUSCULAR | Status: DC | PRN

## 2016-04-01 MED ORDER — FENTANYL CITRATE (PF) 100 MCG/2ML IJ SOLN
INTRAMUSCULAR | Status: AC
  Filled 2016-04-01: qty 2

## 2016-04-01 MED ORDER — BUPIVACAINE HCL (PF) 0.5 % IJ SOLN
INTRAMUSCULAR | Status: AC
  Filled 2016-04-01: qty 30

## 2016-04-01 MED ORDER — IOPAMIDOL (ISOVUE-300) INJECTION 61%
INTRAVENOUS | Status: DC | PRN
  Administered 2016-04-01: 60 mL via INTRAVENOUS

## 2016-04-01 MED ORDER — CEFAZOLIN SODIUM-DEXTROSE 2-4 GM/100ML-% IV SOLN
2.0000 g | Freq: Once | INTRAVENOUS | Status: AC
  Administered 2016-04-01 (×2): 2 g via INTRAVENOUS

## 2016-04-01 MED ORDER — SODIUM CHLORIDE 0.9 % IV SOLN: INTRAVENOUS | Status: DC

## 2016-04-01 MED ORDER — PROPOFOL 500 MG/50ML IV EMUL
INTRAVENOUS | Status: AC
  Filled 2016-04-01: qty 50

## 2016-04-01 MED ORDER — CEFAZOLIN SODIUM-DEXTROSE 2-4 GM/100ML-% IV SOLN
INTRAVENOUS | Status: AC
  Administered 2016-04-01: 2 g via INTRAVENOUS
  Filled 2016-04-01: qty 100

## 2016-04-01 MED ORDER — SODIUM CHLORIDE 0.9 % IV SOLN
INTRAVENOUS | Status: DC
  Administered 2016-04-01: 07:00:00 via INTRAVENOUS

## 2016-04-01 MED ORDER — CEFAZOLIN IN D5W 1 GM/50ML IV SOLN
1.0000 g | Freq: Once | INTRAVENOUS | Status: AC
  Administered 2016-04-01: 1 g via INTRAVENOUS

## 2016-04-01 MED ORDER — PROPOFOL 10 MG/ML IV BOLUS
INTRAVENOUS | Status: DC | PRN
  Administered 2016-04-01: 20 mg via INTRAVENOUS

## 2016-04-01 MED ORDER — HEPARIN (PORCINE) IN NACL 2-0.9 UNIT/ML-% IJ SOLN
INTRAMUSCULAR | Status: AC
  Filled 2016-04-01: qty 1000

## 2016-04-01 SURGICAL SUPPLY — 43 items
BAG COUNTER SPONGE EZ (MISCELLANEOUS) IMPLANT
BANDAGE ELASTIC 3 LF NS (GAUZE/BANDAGES/DRESSINGS) IMPLANT
BANDAGE ELASTIC 4 LF NS (GAUZE/BANDAGES/DRESSINGS) IMPLANT
BANDAGE STRETCH 3X4.1 STRL (GAUZE/BANDAGES/DRESSINGS) IMPLANT
BNDG ESMARK 4X12 TAN STRL LF (GAUZE/BANDAGES/DRESSINGS) ×3 IMPLANT
BNDG GAUZE 4.5X4.1 6PLY STRL (MISCELLANEOUS) ×3 IMPLANT
CANISTER SUCT 1200ML W/VALVE (MISCELLANEOUS) ×3 IMPLANT
CANISTER SUCT 3000ML PPV (MISCELLANEOUS) ×3 IMPLANT
COUNTER SPONGE BAG EZ (MISCELLANEOUS)
CUFF TOURN 18 STER (MISCELLANEOUS) ×3 IMPLANT
CUFF TOURN DUAL PL 12 NO SLV (MISCELLANEOUS) IMPLANT
DRAPE FLUOR MINI C-ARM 54X84 (DRAPES) IMPLANT
DURAPREP 26ML APPLICATOR (WOUND CARE) ×3 IMPLANT
ELECT REM PT RETURN 9FT ADLT (ELECTROSURGICAL) ×3
ELECTRODE REM PT RTRN 9FT ADLT (ELECTROSURGICAL) ×1 IMPLANT
GAUZE PETRO XEROFOAM 1X8 (MISCELLANEOUS) IMPLANT
GAUZE SPONGE 4X4 12PLY STRL (GAUZE/BANDAGES/DRESSINGS) IMPLANT
GLOVE BIO SURGEON STRL SZ8 (GLOVE) ×6 IMPLANT
GLOVE INDICATOR 7.5 STRL GRN (GLOVE) ×6 IMPLANT
GOWN STRL REUS W/ TWL LRG LVL3 (GOWN DISPOSABLE) ×1 IMPLANT
GOWN STRL REUS W/TWL LRG LVL3 (GOWN DISPOSABLE) ×2
HANDPIECE VERSAJET DEBRIDEMENT (MISCELLANEOUS) ×3 IMPLANT
IV NS IRRIG 3000ML ARTHROMATIC (IV SOLUTION) ×3 IMPLANT
KIT RM TURNOVER STRD PROC AR (KITS) ×3 IMPLANT
LABEL OR SOLS (LABEL) IMPLANT
NEEDLE FILTER BLUNT 18X 1/2SAF (NEEDLE) ×2
NEEDLE FILTER BLUNT 18X1 1/2 (NEEDLE) ×1 IMPLANT
NEEDLE HYPO 25X1 1.5 SAFETY (NEEDLE) ×3 IMPLANT
NS IRRIG 500ML POUR BTL (IV SOLUTION) ×3 IMPLANT
PACK EXTREMITY ARMC (MISCELLANEOUS) ×3 IMPLANT
PAD ABD DERMACEA PRESS 5X9 (GAUZE/BANDAGES/DRESSINGS) ×6 IMPLANT
STOCKINETTE STRL 6IN 960660 (GAUZE/BANDAGES/DRESSINGS) ×3 IMPLANT
SUT ETHILON 3-0 FS-10 30 BLK (SUTURE) ×3
SUT ETHILON 4-0 (SUTURE)
SUT ETHILON 4-0 FS2 18XMFL BLK (SUTURE)
SUT VIC AB 3-0 SH 27 (SUTURE) ×2
SUT VIC AB 3-0 SH 27X BRD (SUTURE) ×1 IMPLANT
SUT VIC AB 4-0 FS2 27 (SUTURE) ×3 IMPLANT
SUTURE EHLN 3-0 FS-10 30 BLK (SUTURE) ×1 IMPLANT
SUTURE ETHLN 4-0 FS2 18XMF BLK (SUTURE) IMPLANT
SWAB DUAL CULTURE TRANS RED ST (MISCELLANEOUS) ×3 IMPLANT
SYR 3ML LL SCALE MARK (SYRINGE) IMPLANT
SYRINGE 10CC LL (SYRINGE) ×3 IMPLANT

## 2016-04-01 SURGICAL SUPPLY — 13 items
CATH BERNSTEIN 5FR 130CM (CATHETERS) ×3 IMPLANT
CATH PIG 70CM (CATHETERS) ×3 IMPLANT
CATH VERT 100CM (CATHETERS) ×3 IMPLANT
DEVICE STARCLOSE SE CLOSURE (Vascular Products) ×3 IMPLANT
DEVICE TORQUE (MISCELLANEOUS) ×3 IMPLANT
GLIDEWIRE ANGLED SS 035X260CM (WIRE) ×3 IMPLANT
PACK ANGIOGRAPHY (CUSTOM PROCEDURE TRAY) ×3 IMPLANT
SET INTRO CAPELLA COAXIAL (SET/KITS/TRAYS/PACK) ×3 IMPLANT
SHEATH BRITE TIP 5FRX11 (SHEATH) ×3 IMPLANT
SHEATH HIGHFLEX ANSEL 6FRX55 (SHEATH) ×3 IMPLANT
SYR MEDRAD MARK V 150ML (SYRINGE) ×3 IMPLANT
TUBING CONTRAST HIGH PRESS 72 (TUBING) ×3 IMPLANT
WIRE J 3MM .035X145CM (WIRE) ×3 IMPLANT

## 2016-04-01 NOTE — OR Nursing (Signed)
Dressing on left foot dry and intact.

## 2016-04-01 NOTE — Transfer of Care (Signed)
Immediate Anesthesia Transfer of Care Note  Patient: Matthew Bruce  Procedure(s) Performed: Procedure(s): IRRIGATION AND DEBRIDEMENT FOOT (Left) APPLICATION OF WOUND VAC (Left)  Patient Location: PACU  Anesthesia Type:General  Level of Consciousness: sedated  Airway & Oxygen Therapy: Patient Spontanous Breathing and Patient connected to face mask oxygen  Post-op Assessment: Report given to RN and Post -op Vital signs reviewed and stable  Post vital signs: Reviewed and stable  Last Vitals:  Vitals:   04/01/16 0615  BP: 122/73  Pulse: 100  Resp: 18  Temp: 36.1 C    Last Pain:  Vitals:   04/01/16 0615  TempSrc: Temporal         Complications: No apparent anesthesia complications

## 2016-04-01 NOTE — Progress Notes (Signed)
Pt into Radiology via pt bed. Pt stable. Ready for procedure.

## 2016-04-01 NOTE — Anesthesia Procedure Notes (Signed)
Date/Time: 04/01/2016 7:51 AM Performed by: Nelda Marseille Pre-anesthesia Checklist: Patient identified, Emergency Drugs available, Suction available, Patient being monitored and Timeout performed Oxygen Delivery Method: Simple face mask

## 2016-04-01 NOTE — H&P (Signed)
H and P has been reviewed and no changes are noted.  

## 2016-04-01 NOTE — Anesthesia Postprocedure Evaluation (Signed)
Anesthesia Post Note  Patient: Matthew Bruce  Procedure(s) Performed: Procedure(s) (LRB): IRRIGATION AND DEBRIDEMENT FOOT (Left) APPLICATION OF WOUND VAC (Left)  Patient location during evaluation: PACU Anesthesia Type: General Level of consciousness: awake and alert Pain management: pain level controlled Vital Signs Assessment: post-procedure vital signs reviewed and stable Respiratory status: spontaneous breathing, nonlabored ventilation, respiratory function stable and patient connected to nasal cannula oxygen Cardiovascular status: blood pressure returned to baseline and stable Postop Assessment: no signs of nausea or vomiting Anesthetic complications: no     Last Vitals:  Vitals:   04/01/16 0905 04/01/16 0946  BP: (!) 163/91 (!) 154/90  Pulse: 93 84  Resp: 17 18  Temp: 36.7 C 36.6 C    Last Pain:  Vitals:   04/01/16 0946  TempSrc: Oral  PainSc:                  [name] S

## 2016-04-01 NOTE — Discharge Instructions (Signed)
Leave wound VAC dry clean and in place. Wound VAC will not need to be changed until Monday or Tuesday of next week. Minimize activity and walking as this may dislodge the wound VAC seal.  [place]  POST OPERATIVE INSTRUCTIONS FOR DR. [place]   1. Take your medication as prescribed.  Pain medication should be taken only as needed.  2. Keep the dressing clean, dry and intact.  3. Walking to the bathroom and brief periods of walking are acceptable, unless we have instructed you to be non-weight bearing.  4. Always wear your post-op shoe when walking.  Always use your crutches if you are to be non-weight bearing.  5. Do not take a shower. Recommend just sponge baths at this point. 6. Call [facility] [phone]) if any of the following problems occur: - You develop a temperature or fever. - The bandage becomes saturated with blood. - Medication does not stop your pain. - Injury of the foot occurs. - Any symptoms of infection including redness, odor, or red streaks running from wound. -

## 2016-04-01 NOTE — Op Note (Signed)
Operative note   Surgeon: Dr. Albertine Patricia, DPM.    Assistant: None    Preop diagnosis: Diabetic ulceration secondary to dehiscence of wound after amputation of the gangrenous third toe    Postop diagnosis: Same    EBL: 5 cc    Anesthesia:IV sedation delivered by anesthesia team and 8 cc of 0.5% Marcaine plain delivered by me at the base of the operative site.    Hemostasis: None    Specimen: The wound was cultured for aerobes and anaerobes    Complications: None    Operative indications: Delayed slow healing to the dehisced wound at the third toe amputation site of left foot with necrotic tissue centrally.    Procedure:  Patient was brought into the OR and placed on the operating table in thesupine position. After anesthesia was obtained theright lower extremity was prepped and draped in usual sterile fashion.  Operative Report: This time attention was directed to the digital areas on the left foot. The third toe previously amputated due to gangrene. Patient had an ulceration developed due to dehiscence and lack of healing to the region. This represents an area approximately 2.2 cm x 1.8 cm with 1.5 cm in depth. At this time a versa jet was used to remove the devitalized tissue. A Francee Piccolo was also used to remove some areas that were harder to dislodge. But eventually good granular bleeding tissue was encountered. This point the area was lightly debrided and irrigated with a versa jet system. Incision was made dorsally to extend the area of the metatarsal head was inspected and appeared to be viable and did not show evidence of necrosis. Once soft tissues were cleaned up and irrigated and good tissue was encountered the dorsal incision was closed with 3-0 nylon simple ruptured sutures and the plantar incision was closed with simple ruptured sutures as well. Area was left open in order to pack the region with a wound VAC gauze. The gauze was placed in the wound VAC was established. He was  started run 125 mmHg pressure continuous. It is a difficult area to get a good seal on because of his the digits. We'll check it again before he leaves. He scheduled to have an angioplasty done today also and see if we can establish better blood flow to the region. We'll continue his antibiotics as well.    Patient tolerated the procedure and anesthesia well.  Was transported from the OR to the PACU with all vital signs stable and vascular status intact. To be discharged per routine protocol.  Will follow up in approximately 1 week in the outpatient clinic.

## 2016-04-01 NOTE — Op Note (Signed)
[facility]  Percutaneous Study/Intervention Procedural Note   Date of Surgery: 04/01/2016,12:05 PM  Surgeon:Latrisa Hellums, Dolores Lory   Pre-operative Diagnosis: Atherosclerotic occlusive disease bilateral lower extremities with ulceration of the left third toe  Post-operative diagnosis:  Same  Procedure(s) Performed:  1.  Abdominal aortogram  2.  Left lower extremity distal runoff third order catheter placement  3.  Star close right common femoral artery   Anesthesia: Conscious sedation was administered under my direct supervision. IV Versed plus fentanyl were utilized. Continuous ECG, pulse oximetry and blood pressure was monitored throughout the entire procedure.  Conscious sedation was administered for a total of 40 minutes.  Sheath: 6 [place] Ansell right common femoral artery  Contrast: 60 cc   Fluoroscopy Time: 8.1 minutes  Indications:  Patient has recurrence of the ulceration left third toe. He is undergoing angiography to ensure that he has adequate arterial perfusion for wound healing and limb salvage.  Procedure:  Matthew Barbary Leonardis a 82 y.o. male who was identified and appropriate procedural time out was performed.  The patient was then placed supine on the table and prepped and draped in the usual sterile fashion.  Ultrasound was used to evaluate the right common femoral artery.  It was patent .  A digital ultrasound image was acquired.  Amicropuncture needle was used to access the right common femoral artery under direct ultrasound guidance and a permanent image wassaved for the record.microwire was then advanced under fluoroscopic guidance followed by micro-sheath.  A 0.035 J wire was advanced without resistance and a 5Fr sheath was placed.    Pigtail catheter was then advanced to the level of T12 and AP projection of the aorta was obtained. Pigtail catheter was then repositioned to above the bifurcation and RAO view of the pelvis was obtained. Stiff  angled Glidewire and pigtail catheter was then used across the bifurcation and the catheter was positioned in the distal external iliac artery.  LAO of the left groin was then obtained. Wire was reintroduced and the catheter negotiated into the SFA and the catheter was advanced into the SFA. Distal runoff was then performed.  After review of the images the catheter was removed over wire and an RAO view of the groin was obtained. StarClose device was deployed without difficulty.  Findings:   Aortogram:  Widely patent it is heavily calcified but there are no hemodynamically significant lesions noted. The aorta and iliac arteries are very tortuous but widely patent  Right Lower Extremity:  Common femoral and origins of the SFA and profunda are all widely patent  Left Lower Extremity:  There is diffuse disease throughout the entire left lower extremity arterial system. I do not identify any focal hemodynamically significant lesions. Trifurcation is heavily diseased with occlusion of the anterior tibial and posterior tibial. Distally a short 4-6 mm segment of dorsalis pedis reconstitutes but then occludes again. The lateral and medial plantar arteries are reconstituted via peroneal collaterals. The peroneal is patent and demonstrates in-line flow down to these collaterals.  Summary although he has diffuse atherosclerotic changes throughout his entire arterial system I do not identify any focal hemodynamically significant lesion that would require treatment at this time.   Disposition: Patient was taken to the recovery room in stable condition having tolerated the procedure well.  [name] 04/01/2016,12:05 PM

## 2016-04-01 NOTE — Progress Notes (Signed)
Transcribed orders to NOT premedicate patient for shellfish allergy, signed by Dr Delana Meyer. [facility] lab tech notified.

## 2016-04-01 NOTE — H&P (Signed)
[facility] Admission History & Physical  MRN : [id number]  Matthew Bruce is a 82 y.o. (December 07, 1926) male who presents with chief complaint of No chief complaint on file. Marland Kitchen  History of Present Illness: The patient is an 82 year old gentleman with known atherosclerotic occlusive disease associated with gangrenous changes to the left third toe. Approximately 2 months ago he underwent angiography with intervention and subsequently partial amputation of the left third toe. Although initially the wound appeared to be healing quite nicely recently he has developed dehiscence of the suture line and some tissue necrosis. The necrotic changes have occurred over the past 2 weeks. There are no aggravating or alleviating factors that he can identify.  Patient denies trauma to his foot. He denies increased pain or any color changes to his foot. No fever or chills.  Current Facility-Administered Medications  Medication Dose Route Frequency Provider Last Rate Last Dose  . 0.9 %  sodium chloride infusion   Intravenous Continuous Katha Cabal, MD 50 mL/hr at 04/01/16 1002      Past Medical History:  Diagnosis Date  . Anemia   . Aortic valve disorder   . Coronary artery disease   . Diabetes mellitus without complication ([place])   . Glaucoma   . History of BPH   . History of kidney stones   . HOH (hard of hearing)    Bilateral Hearing Aids  . Hyperlipidemia   . Hypertension   . Kidney stones   . Myocardial infarction   . Neuropathy ([place])   . Peripheral vascular disease [facility])     Past Surgical History:  Procedure Laterality Date  . AMPUTATION TOE Left 02/11/2016   Procedure: AMPUTATION TOE;  Surgeon: Albertine Patricia, DPM;  Location: [facility];  Service: Podiatry;  Laterality: Left;  . CARDIAC CATHETERIZATION    . CARDIAC SURGERY    . CARDIAC VALVE REPLACEMENT    . CORONARY ARTERY BYPASS GRAFT    . PERIPHERAL VASCULAR CATHETERIZATION Left 02/03/2016   Procedure:  Lower Extremity Angiography;  Surgeon: Algernon Huxley, MD;  Location: [facility];  Service: Cardiovascular;  Laterality: Left;  Marland Kitchen VALVE REPLACEMENT  2007   Aortic Valve Replacement, [employer] Porcine Valve    Social History Social History  Substance Use Topics  . Smoking status: Former Smoker    Packs/day: 1.00    Types: Cigarettes  . Smokeless tobacco: Current User    Types: Chew  . Alcohol use No    Family History Family History  Problem Relation Age of Onset  . Heart failure Neg Hx   No family history of bleeding clotting disorders, autoimmune disease or porphyria  Allergies  Allergen Reactions  . Shellfish Allergy Hives and Rash    Patient denies allergy (03/30/16)     REVIEW OF SYSTEMS (Negative unless checked)  Constitutional: '[]'$ Weight loss  '[]'$ Fever  '[]'$ Chills Cardiac: '[]'$ Chest pain   '[]'$ Chest pressure   '[]'$ Palpitations   '[]'$ Shortness of breath when laying flat   '[]'$ Shortness of breath at rest   '[x]'$ Shortness of breath with exertion. Vascular:  '[]'$ Pain in legs with walking   '[]'$ Pain in legs at rest   '[]'$ Pain in legs when laying flat   '[]'$ Claudication   '[]'$ Pain in feet when walking  '[]'$ Pain in feet at rest  '[]'$ Pain in feet when laying flat   '[]'$ History of DVT   '[]'$ Phlebitis   '[x]'$ Swelling in legs   '[]'$ Varicose veins   '[x]'$ Non-healing ulcers Pulmonary:   '[]'$ Uses home oxygen   '[]'$   Productive cough   '[]'$ Hemoptysis   '[]'$ Wheeze  '[]'$ COPD   '[]'$ Asthma Neurologic:  '[x]'$  hard of hearing  '[]'$ Blackouts   '[]'$ Seizures   '[]'$ History of stroke   '[]'$ History of TIA  '[]'$ Aphasia   '[]'$ Temporary blindness   '[]'$ Dysphagia   '[]'$ Weakness or numbness in arms   '[]'$ Weakness or numbness in legs Musculoskeletal:  '[]'$ Arthritis   '[]'$ Joint swelling   '[]'$ Joint pain   '[]'$ Low back pain Hematologic:  '[]'$ Easy bruising  '[]'$ Easy bleeding   '[]'$ Hypercoagulable state   '[]'$ Anemic  '[]'$ Hepatitis Gastrointestinal:  '[]'$ Blood in stool   '[]'$ Vomiting blood  '[]'$ Gastroesophageal reflux/heartburn   '[]'$ Difficulty swallowing. Genitourinary:  '[]'$ Chronic kidney disease    '[]'$ Difficult urination  '[]'$ Frequent urination  '[]'$ Burning with urination   '[]'$ Blood in urine Skin:  '[]'$ Rashes   '[]'$ Ulcers   '[]'$ Wounds Psychological:  '[]'$ History of anxiety   '[]'$  History of major depression.  Physical Examination  Vitals:   04/01/16 0835 04/01/16 0850 04/01/16 0905 04/01/16 0946  BP: 140/77 (!) 160/81 (!) 163/91 (!) 154/90  Pulse: 94 90 93 84  Resp: '16 17 17 18  '$ Temp: 98 F (36.7 C)  98 F (36.7 C) 97.9 F (36.6 C)  TempSrc:    Oral  SpO2: 99% 100% 98% 97%  Weight:       Body mass index is 27.94 kg/m. Gen: WD/WN, NAD Head: [place]/AT, No temporalis wasting. Prominent temp pulse not noted. Ear/Nose/Throat: Hearing grossly intact, nares w/o erythema or drainage, oropharynx w/o Erythema/Exudate,  Eyes: Conjunctiva clear, sclera non-icteric Neck: Trachea midline.  No JVD.  Pulmonary:  Good air movement, respirations not labored, no use of accessory muscles.  Cardiac: RRR, normal S1, S2. Vascular: Gangrenous changes left third toe Vessel Right Left  Radial Palpable Palpable  Ulnar Palpable Palpable  Brachial Palpable Palpable  Carotid Palpable, without bruit Palpable, without bruit  Aorta Not palpable N/A  Femoral Palpable Palpable  Popliteal Not Palpable Not Palpable  PT Not Palpable Not Palpable  DP Not Palpable Not Palpable   Gastrointestinal: soft, non-tender/non-distended. No guarding/reflex.  Musculoskeletal: M/S 5/5 throughout.  Extremities without ischemic changes.  No deformity or atrophy.  Neurologic: Sensation grossly intact in extremities.  Symmetrical.  Speech is fluent. Motor exam as listed above. Psychiatric: Judgment intact, Mood & affect appropriate for pt's clinical situation. Dermatologic: No rashes noted.  No cellulitis or open wounds. Lymph : No Cervical, Axillary, or Inguinal lymphadenopathy.     CBC Lab Results  Component Value Date   WBC 10.4 03/30/2016   HGB 12.2 (L) 03/30/2016   HCT 35.8 (L) 03/30/2016   MCV 88.3 03/30/2016   PLT 404  03/30/2016    BMET    Component Value Date/Time   NA 135 03/30/2016 1325   K 4.5 03/30/2016 1325   CL 104 03/30/2016 1325   CO2 23 03/30/2016 1325   GLUCOSE 174 (H) 03/30/2016 1325   BUN 22 (H) 03/30/2016 1325   CREATININE 1.32 (H) 03/30/2016 1325   CALCIUM 9.8 03/30/2016 1325   GFRNONAA 47 (L) 03/30/2016 1325   GFRAA 54 (L) 03/30/2016 1325   Estimated Creatinine Clearance: 46.8 mL/min (by C-G formula based on SCr of 1.32 mg/dL (H)).  COAG No results found for: INR, PROTIME  Radiology No results found.  Assessment/Plan 1. Atherosclerotic occlusive disease bilateral lower extremities with ulceration of the left third toe: Patient will undergo angiography with the hope for intervention. He is undergoing debridement and placement of a wound VAC today by Dr. Elvina Mattes. The risks and benefits for angiography were discussed with the  patient and his daughter he has undergone this procedure before. All questions were answered area he wishes to proceed. 2. Ulceration left third toe: VAC wound care system will be placed after debridement 3. Hypertension: Patient will be continued on his home antihypertensive medications no changes. 4. Coronary artery disease: Patient will be monitored throughout the procedure area and nitrates will be given when necessary for chest pain. He will continue his home medications. 5.  Diabetes mellitus: Patient will have his blood sugars monitored sliding scale will be administered as needed.   Hortencia Pilar, MD  04/01/2016 10:20 AM

## 2016-04-01 NOTE — Anesthesia Preprocedure Evaluation (Signed)
Anesthesia Evaluation  Patient identified by MRN, date of birth, ID band Patient awake    Reviewed: Allergy & Precautions, NPO status , Patient's Chart, lab work & pertinent test results, reviewed documented beta blocker date and time   Airway Mallampati: II  TM Distance: >3 FB     Dental  (+) Chipped   Pulmonary former smoker,           Cardiovascular hypertension, Pt. on medications + CAD, + Past MI, + CABG and + Peripheral Vascular Disease       Neuro/Psych  Neuromuscular disease CVA    GI/Hepatic   Endo/Other  diabetes, Type 2  Renal/GU Renal disease     Musculoskeletal   Abdominal   Peds  Hematology  (+) anemia ,   Anesthesia Other Findings   Reproductive/Obstetrics                             Anesthesia Physical Anesthesia Plan  ASA: III  Anesthesia Plan: General   Post-op Pain Management:    Induction: Intravenous  Airway Management Planned: LMA  Additional Equipment:   Intra-op Plan:   Post-operative Plan:   Informed Consent: I have reviewed the patients History and Physical, chart, labs and discussed the procedure including the risks, benefits and alternatives for the proposed anesthesia with the patient or authorized representative who has indicated his/her understanding and acceptance.     Plan Discussed with: CRNA  Anesthesia Plan Comments:         Anesthesia Quick Evaluation

## 2016-04-01 NOTE — Progress Notes (Signed)
Dr Elvina Mattes -DPM here to redress right foot  3rd toe amputation dressing. Area redressed by Dr and wound Vac restarted and suction is established. Pt tol procedure well. No co pain. resp regular and unlabored. Pt pink warm and dry. No co pain at this time.

## 2016-04-01 NOTE — OR Nursing (Signed)
Patient brought wound vac supplies Dr Elvina Mattes added this to consent. Contacted specials and spoke with Vicky to make them aware patient did not stop Plavix.

## 2016-04-01 NOTE — Progress Notes (Signed)
Discharge instructions given to wife and Pt. Follow up plan of care discussed. Wife states she had a long talk with Dr. Elvina Mattes and understands discharge instrucitons for foot care. Wife also states she has an appt with dr. Elvina Mattes on Wednesday. All questions answered . Wife sTATES SHE UNDERSTANDS. Dressing dry and intact. No drainage noted.

## 2016-04-01 NOTE — Progress Notes (Signed)
Procedure completed pt tol well. Rested quietly for the entire procedure. Pt pink warm and dry. resp regular and unlabored. No distress noted. Star closure device used. Pt to return to [facility] soon.

## 2016-04-05 ENCOUNTER — Encounter: Payer: Self-pay | Admitting: Vascular Surgery

## 2016-04-05 DIAGNOSIS — E1142 Type 2 diabetes mellitus with diabetic polyneuropathy: Secondary | ICD-10-CM | POA: Diagnosis not present

## 2016-04-05 DIAGNOSIS — E1159 Type 2 diabetes mellitus with other circulatory complications: Secondary | ICD-10-CM | POA: Diagnosis not present

## 2016-04-05 DIAGNOSIS — L97522 Non-pressure chronic ulcer of other part of left foot with fat layer exposed: Secondary | ICD-10-CM | POA: Diagnosis not present

## 2016-04-06 LAB — AEROBIC/ANAEROBIC CULTURE (SURGICAL/DEEP WOUND)

## 2016-04-06 LAB — AEROBIC/ANAEROBIC CULTURE W GRAM STAIN (SURGICAL/DEEP WOUND)

## 2016-04-25 DIAGNOSIS — E1142 Type 2 diabetes mellitus with diabetic polyneuropathy: Secondary | ICD-10-CM | POA: Diagnosis not present

## 2016-04-25 DIAGNOSIS — E1159 Type 2 diabetes mellitus with other circulatory complications: Secondary | ICD-10-CM | POA: Diagnosis not present

## 2016-04-25 DIAGNOSIS — L97522 Non-pressure chronic ulcer of other part of left foot with fat layer exposed: Secondary | ICD-10-CM | POA: Diagnosis not present

## 2016-04-26 ENCOUNTER — Encounter (INDEPENDENT_AMBULATORY_CARE_PROVIDER_SITE_OTHER): Payer: Medicare HMO

## 2016-04-26 ENCOUNTER — Encounter (INDEPENDENT_AMBULATORY_CARE_PROVIDER_SITE_OTHER): Payer: Medicare HMO | Admitting: Vascular Surgery

## 2016-04-27 DIAGNOSIS — M79672 Pain in left foot: Secondary | ICD-10-CM | POA: Diagnosis not present

## 2016-04-27 DIAGNOSIS — L97522 Non-pressure chronic ulcer of other part of left foot with fat layer exposed: Secondary | ICD-10-CM | POA: Diagnosis not present

## 2016-05-01 DIAGNOSIS — E1159 Type 2 diabetes mellitus with other circulatory complications: Secondary | ICD-10-CM | POA: Diagnosis not present

## 2016-05-01 DIAGNOSIS — L97522 Non-pressure chronic ulcer of other part of left foot with fat layer exposed: Secondary | ICD-10-CM | POA: Diagnosis not present

## 2016-05-01 DIAGNOSIS — E1142 Type 2 diabetes mellitus with diabetic polyneuropathy: Secondary | ICD-10-CM | POA: Diagnosis not present

## 2016-05-10 DIAGNOSIS — E1142 Type 2 diabetes mellitus with diabetic polyneuropathy: Secondary | ICD-10-CM | POA: Diagnosis not present

## 2016-05-10 DIAGNOSIS — M86472 Chronic osteomyelitis with draining sinus, left ankle and foot: Secondary | ICD-10-CM | POA: Diagnosis not present

## 2016-05-10 DIAGNOSIS — L97522 Non-pressure chronic ulcer of other part of left foot with fat layer exposed: Secondary | ICD-10-CM | POA: Diagnosis not present

## 2016-05-11 ENCOUNTER — Encounter
Admission: RE | Admit: 2016-05-11 | Discharge: 2016-05-11 | Disposition: A | Discharge status: Home / Self Care | Payer: Medicare HMO | Source: Ambulatory Visit | Attending: Podiatry | Admitting: Podiatry

## 2016-05-11 ENCOUNTER — Encounter: Payer: Self-pay | Admitting: *Deleted

## 2016-05-11 NOTE — Patient Instructions (Signed)
  Your procedure is scheduled on: 05-13-16 Report to Same Day Surgery 2nd floor medical mall [facility] Entrance-take elevator on left to 2nd floor.  Check in with surgery information desk.) To find out your arrival time please call [phone] between 1PM - 3PM on 05-12-16  Remember: Instructions that are not followed completely may result in serious medical risk, up to and including death, or upon the discretion of your surgeon and anesthesiologist your surgery may need to be rescheduled.    _x___ 1. Do not eat food or drink liquids after midnight. No gum chewing or hard candies.     __x__ 2. No Alcohol for 24 hours before or after surgery.   __x__3. No Smoking for 24 prior to surgery.   ____  4. Bring all medications with you on the day of surgery if instructed.    __x__ 5. Notify your doctor if there is any change in your medical condition     (cold, fever, infections).     Do not wear jewelry, make-up, hairpins, clips or nail polish.  Do not wear lotions, powders, or perfumes. You may wear deodorant.  Do not shave 48 hours prior to surgery. Men may shave face and neck.  Do not bring valuables to the hospital.    [facility] is not responsible for any belongings or valuables.               Contacts, dentures or bridgework may not be worn into surgery.  Leave your suitcase in the car. After surgery it may be brought to your room.  For patients admitted to the hospital, discharge time is determined by your treatment team.   Patients discharged the day of surgery will not be allowed to drive home.  You will need someone to drive you home and stay with you the night of your procedure.    Please read over the following fact sheets that you were given:   [facility] Preparing for Surgery and or MRSA Information   ____ Take these medicines the morning of surgery with A SIP OF WATER:    1. NONE  2.  3.  4.  5.  6.  ____Fleets enema or Magnesium Citrate as directed.   ____ Use  CHG Soap or sage wipes as directed on instruction sheet   ____ Use inhalers on the day of surgery and bring to hospital day of surgery  _X___ Stop metformin 2 days prior to surgery-STOP NOW (05-11-16)    ____ Take 1/2 of usual insulin dose the night before surgery and none on the morning of  surgery.   _X___ Stop Aspirin, Coumadin, Pllavix ,Eliquis, Effient, or Pradaxa-STOP PLAVIX NOW (05-11-16)-2 DAYS PRIOR PER DR TROXLER  x__ Stop Anti-inflammatories such as Advil, Aleve, Ibuprofen, Motrin, Naproxen,          Naprosyn, Goodies powders or aspirin products. Ok to take Tylenol.   ____ Stop supplements until after surgery.    ____ Bring C-Pap to the hospital.

## 2016-05-12 ENCOUNTER — Encounter: Payer: Self-pay | Admitting: *Deleted

## 2016-05-12 MED ORDER — VANCOMYCIN HCL IN DEXTROSE 1-5 GM/200ML-% IV SOLN
1000.0000 mg | Freq: Once | INTRAVENOUS | Status: AC
  Administered 2016-05-13: 1000 mg via INTRAVENOUS

## 2016-05-12 NOTE — Pre-Procedure Instructions (Signed)
SPOKE WITH CINDY AT DR Georgette Shell OFFICE REGARDING EKG THAT DR Elvina Mattes HAD ORDERED PREOP- I INFORMED CINDY THAT PT HAD ONE DONE November 2017 WITH NO SIGNIFICANT CHANGE- I TOLD CINDY THAT ANESTHESIA WANTS EKG DONE WITHIN 6 MONTHS OF SURGERY AND HIS IS WITHIN THAT TIMEFRAME-CINDY TALKED WITH DR Elvina Mattes WHO SAID IF ANESTHESIA OK WITH 30 MONTH OLD EKG, THEN EKG DID NOT NEED TO BE DONE.

## 2016-05-12 NOTE — Pre-Procedure Instructions (Signed)
SPOKE WITH CINDY AT DR Georgette Shell OFFICE REGARDING PT'S PLAVIX-CINDY TALKED WITH DR Elvina Mattes AND HE SAID TO STOP PLAVIX 2 DAYS PRIOR TO SURGERY-  I CALLED DEE, PTS DAUGHTER BACK, AND INFORMED HER OF THIS-PTS LAST DOSE WAS ON 05-10-16

## 2016-05-13 ENCOUNTER — Encounter: Payer: Self-pay | Admitting: Anesthesiology

## 2016-05-13 ENCOUNTER — Encounter
Admission: RE | Disposition: A | Discharge status: Home / Self Care | Payer: Self-pay | Source: Ambulatory Visit | Attending: Podiatry

## 2016-05-13 ENCOUNTER — Ambulatory Visit: Payer: Medicare HMO | Admitting: Anesthesiology

## 2016-05-13 ENCOUNTER — Ambulatory Visit
Admission: RE | Admit: 2016-05-13 | Discharge: 2016-05-13 | Disposition: A | Discharge status: Home / Self Care | Payer: Medicare HMO | Source: Ambulatory Visit | Attending: Podiatry | Admitting: Podiatry

## 2016-05-13 DIAGNOSIS — I10 Essential (primary) hypertension: Secondary | ICD-10-CM | POA: Diagnosis not present

## 2016-05-13 DIAGNOSIS — I359 Nonrheumatic aortic valve disorder, unspecified: Secondary | ICD-10-CM | POA: Diagnosis not present

## 2016-05-13 DIAGNOSIS — I252 Old myocardial infarction: Secondary | ICD-10-CM | POA: Insufficient documentation

## 2016-05-13 DIAGNOSIS — E11621 Type 2 diabetes mellitus with foot ulcer: Secondary | ICD-10-CM | POA: Insufficient documentation

## 2016-05-13 DIAGNOSIS — L97521 Non-pressure chronic ulcer of other part of left foot limited to breakdown of skin: Secondary | ICD-10-CM | POA: Insufficient documentation

## 2016-05-13 DIAGNOSIS — E1151 Type 2 diabetes mellitus with diabetic peripheral angiopathy without gangrene: Secondary | ICD-10-CM | POA: Diagnosis not present

## 2016-05-13 DIAGNOSIS — L97522 Non-pressure chronic ulcer of other part of left foot with fat layer exposed: Secondary | ICD-10-CM | POA: Diagnosis not present

## 2016-05-13 DIAGNOSIS — Z7982 Long term (current) use of aspirin: Secondary | ICD-10-CM | POA: Diagnosis not present

## 2016-05-13 DIAGNOSIS — Z7984 Long term (current) use of oral hypoglycemic drugs: Secondary | ICD-10-CM | POA: Insufficient documentation

## 2016-05-13 DIAGNOSIS — G5792 Unspecified mononeuropathy of left lower limb: Secondary | ICD-10-CM | POA: Diagnosis not present

## 2016-05-13 DIAGNOSIS — Z8614 Personal history of Methicillin resistant Staphylococcus aureus infection: Secondary | ICD-10-CM | POA: Diagnosis not present

## 2016-05-13 DIAGNOSIS — Z951 Presence of aortocoronary bypass graft: Secondary | ICD-10-CM | POA: Insufficient documentation

## 2016-05-13 DIAGNOSIS — Z87891 Personal history of nicotine dependence: Secondary | ICD-10-CM | POA: Diagnosis not present

## 2016-05-13 DIAGNOSIS — M869 Osteomyelitis, unspecified: Secondary | ICD-10-CM | POA: Diagnosis not present

## 2016-05-13 DIAGNOSIS — E1169 Type 2 diabetes mellitus with other specified complication: Secondary | ICD-10-CM | POA: Diagnosis not present

## 2016-05-13 DIAGNOSIS — T8781 Dehiscence of amputation stump: Secondary | ICD-10-CM | POA: Insufficient documentation

## 2016-05-13 DIAGNOSIS — N400 Enlarged prostate without lower urinary tract symptoms: Secondary | ICD-10-CM | POA: Diagnosis not present

## 2016-05-13 DIAGNOSIS — L97529 Non-pressure chronic ulcer of other part of left foot with unspecified severity: Secondary | ICD-10-CM | POA: Diagnosis not present

## 2016-05-13 DIAGNOSIS — Y835 Amputation of limb(s) as the cause of abnormal reaction of the patient, or of later complication, without mention of misadventure at the time of the procedure: Secondary | ICD-10-CM | POA: Diagnosis not present

## 2016-05-13 DIAGNOSIS — M86472 Chronic osteomyelitis with draining sinus, left ankle and foot: Secondary | ICD-10-CM | POA: Diagnosis not present

## 2016-05-13 DIAGNOSIS — Z954 Presence of other heart-valve replacement: Secondary | ICD-10-CM | POA: Insufficient documentation

## 2016-05-13 DIAGNOSIS — E1142 Type 2 diabetes mellitus with diabetic polyneuropathy: Secondary | ICD-10-CM | POA: Diagnosis not present

## 2016-05-13 DIAGNOSIS — I739 Peripheral vascular disease, unspecified: Secondary | ICD-10-CM | POA: Diagnosis not present

## 2016-05-13 DIAGNOSIS — I25810 Atherosclerosis of coronary artery bypass graft(s) without angina pectoris: Secondary | ICD-10-CM | POA: Diagnosis not present

## 2016-05-13 DIAGNOSIS — I2510 Atherosclerotic heart disease of native coronary artery without angina pectoris: Secondary | ICD-10-CM | POA: Diagnosis not present

## 2016-05-13 HISTORY — PX: IRRIGATION AND DEBRIDEMENT FOOT: SHX6602

## 2016-05-13 HISTORY — PX: OSTECTOMY: SHX6439

## 2016-05-13 HISTORY — DX: Carrier or suspected carrier of methicillin resistant Staphylococcus aureus: Z22.322

## 2016-05-13 LAB — BASIC METABOLIC PANEL
ANION GAP: 7 (ref 5–15)
BUN: 18 mg/dL (ref 6–20)
CALCIUM: 9.3 mg/dL (ref 8.9–10.3)
CO2: 23 mmol/L (ref 22–32)
Chloride: 103 mmol/L (ref 101–111)
Creatinine, Ser: 1.23 mg/dL (ref 0.61–1.24)
GFR calc non Af Amer: 51 mL/min — ABNORMAL LOW (ref >60)
GFR, EST AFRICAN AMERICAN: 59 mL/min — AB (ref >60)
GLUCOSE: 238 mg/dL — AB (ref 65–99)
POTASSIUM: 4 mmol/L (ref 3.5–5.1)
Sodium: 133 mmol/L — ABNORMAL LOW (ref 135–145)

## 2016-05-13 LAB — GLUCOSE, CAPILLARY
GLUCOSE-CAPILLARY: 215 mg/dL — AB (ref 65–99)
GLUCOSE-CAPILLARY: 260 mg/dL — AB (ref 65–99)

## 2016-05-13 LAB — SURGICAL PCR SCREEN
MRSA, PCR: NEGATIVE
STAPHYLOCOCCUS AUREUS: NEGATIVE

## 2016-05-13 SURGERY — OSTECTOMY
Anesthesia: General | Site: Foot | Laterality: Left

## 2016-05-13 MED ORDER — LIDOCAINE HCL (CARDIAC) 20 MG/ML IV SOLN
INTRAVENOUS | Status: DC | PRN
  Administered 2016-05-13: 100 mg via INTRATRACHEAL

## 2016-05-13 MED ORDER — VANCOMYCIN HCL IN DEXTROSE 1-5 GM/200ML-% IV SOLN
INTRAVENOUS | Status: AC
  Administered 2016-05-13: 1000 mg via INTRAVENOUS
  Filled 2016-05-13: qty 200

## 2016-05-13 MED ORDER — SODIUM CHLORIDE 0.9 % IV SOLN
INTRAVENOUS | Status: DC
  Administered 2016-05-13: 07:00:00 via INTRAVENOUS

## 2016-05-13 MED ORDER — PROPOFOL 10 MG/ML IV BOLUS
INTRAVENOUS | Status: DC | PRN
  Administered 2016-05-13: 80 mg via INTRAVENOUS

## 2016-05-13 MED ORDER — FENTANYL CITRATE (PF) 100 MCG/2ML IJ SOLN: 25.0000 ug | INTRAMUSCULAR | Status: DC | PRN

## 2016-05-13 MED ORDER — PROPOFOL 10 MG/ML IV BOLUS
INTRAVENOUS | Status: AC
  Filled 2016-05-13: qty 20

## 2016-05-13 MED ORDER — ONDANSETRON HCL 4 MG/2ML IJ SOLN
INTRAMUSCULAR | Status: DC | PRN
  Administered 2016-05-13: 4 mg via INTRAVENOUS

## 2016-05-13 MED ORDER — BUPIVACAINE HCL (PF) 0.5 % IJ SOLN
INTRAMUSCULAR | Status: AC
  Filled 2016-05-13: qty 30

## 2016-05-13 MED ORDER — FENTANYL CITRATE (PF) 250 MCG/5ML IJ SOLN
INTRAMUSCULAR | Status: AC
Start: 2016-05-13 — End: 2016-05-13
  Filled 2016-05-13: qty 5

## 2016-05-13 MED ORDER — FAMOTIDINE 20 MG PO TABS
ORAL_TABLET | ORAL | Status: AC
  Administered 2016-05-13: 20 mg via ORAL
  Filled 2016-05-13: qty 1

## 2016-05-13 MED ORDER — ONDANSETRON HCL 4 MG/2ML IJ SOLN: 4.0000 mg | Freq: Once | INTRAMUSCULAR | Status: DC | PRN

## 2016-05-13 MED ORDER — BUPIVACAINE HCL 0.5 % IJ SOLN
INTRAMUSCULAR | Status: DC | PRN
  Administered 2016-05-13: 10 mL

## 2016-05-13 MED ORDER — LIDOCAINE HCL (PF) 1 % IJ SOLN
INTRAMUSCULAR | Status: AC
  Filled 2016-05-13: qty 30

## 2016-05-13 MED ORDER — FAMOTIDINE 20 MG PO TABS
20.0000 mg | ORAL_TABLET | Freq: Once | ORAL | Status: AC
  Administered 2016-05-13: 20 mg via ORAL

## 2016-05-13 MED ORDER — FENTANYL CITRATE (PF) 250 MCG/5ML IJ SOLN
INTRAMUSCULAR | Status: DC | PRN
  Administered 2016-05-13: 25 ug via INTRAVENOUS
  Administered 2016-05-13 (×2): 50 ug via INTRAVENOUS
  Administered 2016-05-13: 25 ug via INTRAVENOUS

## 2016-05-13 MED ORDER — PHENYLEPHRINE HCL 10 MG/ML IJ SOLN
INTRAMUSCULAR | Status: DC | PRN
  Administered 2016-05-13 (×2): 100 ug via INTRAVENOUS
  Administered 2016-05-13: 150 ug via INTRAVENOUS
  Administered 2016-05-13 (×2): 100 ug via INTRAVENOUS

## 2016-05-13 MED ORDER — HYDROCODONE-ACETAMINOPHEN 5-325 MG PO TABS: 1.0000 | ORAL_TABLET | Freq: Four times a day (QID) | ORAL | 0 refills | Status: DC | PRN

## 2016-05-13 SURGICAL SUPPLY — 51 items
APLIGRAF (Prosthesis & Implant Plastic) ×4 IMPLANT
BAG COUNTER SPONGE EZ (MISCELLANEOUS) ×3 IMPLANT
BANDAGE ELASTIC 3 LF NS (GAUZE/BANDAGES/DRESSINGS) ×4 IMPLANT
BANDAGE ELASTIC 4 LF NS (GAUZE/BANDAGES/DRESSINGS) ×4 IMPLANT
BANDAGE STRETCH 3X4.1 STRL (GAUZE/BANDAGES/DRESSINGS) ×4 IMPLANT
BNDG ESMARK 4X12 TAN STRL LF (GAUZE/BANDAGES/DRESSINGS) ×4 IMPLANT
BNDG GAUZE 4.5X4.1 6PLY STRL (MISCELLANEOUS) ×4 IMPLANT
CANISTER SUCT 1200ML W/VALVE (MISCELLANEOUS) ×4 IMPLANT
CLOSURE WOUND 1/4X4 (GAUZE/BANDAGES/DRESSINGS) ×1
COUNTER SPONGE BAG EZ (MISCELLANEOUS) ×1
CUFF TOURN 18 STER (MISCELLANEOUS) ×4 IMPLANT
CUFF TOURN DUAL PL 12 NO SLV (MISCELLANEOUS) ×4 IMPLANT
DRAPE FLUOR MINI C-ARM 54X84 (DRAPES) ×4 IMPLANT
DRSG TEGADERM 4X4.75 (GAUZE/BANDAGES/DRESSINGS) ×4 IMPLANT
DURAPREP 26ML APPLICATOR (WOUND CARE) ×4 IMPLANT
ELECT REM PT RETURN 9FT ADLT (ELECTROSURGICAL) ×4
ELECTRODE REM PT RTRN 9FT ADLT (ELECTROSURGICAL) ×2 IMPLANT
GAUZE PETRO XEROFOAM 1X8 (MISCELLANEOUS) ×4 IMPLANT
GAUZE SPONGE 4X4 12PLY STRL (GAUZE/BANDAGES/DRESSINGS) ×4 IMPLANT
GAUZE STRETCH 2X75IN STRL (MISCELLANEOUS) ×4 IMPLANT
GLOVE BIO SURGEON STRL SZ7.5 (GLOVE) ×4 IMPLANT
GLOVE BIO SURGEON STRL SZ8 (GLOVE) ×4 IMPLANT
GLOVE INDICATOR 7.5 STRL GRN (GLOVE) ×4 IMPLANT
GLOVE INDICATOR 8.0 STRL GRN (GLOVE) ×4 IMPLANT
GOWN STRL REUS W/ TWL LRG LVL3 (GOWN DISPOSABLE) ×2 IMPLANT
GOWN STRL REUS W/TWL LRG LVL3 (GOWN DISPOSABLE) ×2
GRAFT APLIGRAF (Prosthesis & Implant Plastic) ×2 IMPLANT
HANDPIECE VERSAJET DEBRIDEMENT (MISCELLANEOUS) ×4 IMPLANT
KIT RM TURNOVER STRD PROC AR (KITS) ×4 IMPLANT
LABEL OR SOLS (LABEL) ×4 IMPLANT
NDL SAFETY 18GX1.5 (NEEDLE) ×4 IMPLANT
NEEDLE FILTER BLUNT 18X 1/2SAF (NEEDLE) ×2
NEEDLE FILTER BLUNT 18X1 1/2 (NEEDLE) ×2 IMPLANT
NEEDLE HYPO 25X1 1.5 SAFETY (NEEDLE) ×8 IMPLANT
NS IRRIG 500ML POUR BTL (IV SOLUTION) ×4 IMPLANT
PACK EXTREMITY ARMC (MISCELLANEOUS) ×4 IMPLANT
PENCIL ELECTRO HAND CTR (MISCELLANEOUS) ×4 IMPLANT
STOCKINETTE M/LG 89821 (MISCELLANEOUS) ×4 IMPLANT
STOCKINETTE STRL 6IN 960660 (GAUZE/BANDAGES/DRESSINGS) ×4 IMPLANT
STRIP CLOSURE SKIN 1/4X4 (GAUZE/BANDAGES/DRESSINGS) ×3 IMPLANT
SUT ETHILON 3-0 FS-10 30 BLK (SUTURE) ×4
SUT ETHILON 4-0 (SUTURE) ×2
SUT ETHILON 4-0 FS2 18XMFL BLK (SUTURE) ×2
SUT VIC AB 3-0 SH 27 (SUTURE) ×2
SUT VIC AB 3-0 SH 27X BRD (SUTURE) ×2 IMPLANT
SUT VIC AB 4-0 FS2 27 (SUTURE) ×4 IMPLANT
SUT VICRYL AB 3-0 FS1 BRD 27IN (SUTURE) ×4 IMPLANT
SUTURE EHLN 3-0 FS-10 30 BLK (SUTURE) ×2 IMPLANT
SUTURE ETHLN 4-0 FS2 18XMF BLK (SUTURE) ×2 IMPLANT
SYR 3ML LL SCALE MARK (SYRINGE) ×8 IMPLANT
SYRINGE 10CC LL (SYRINGE) ×4 IMPLANT

## 2016-05-13 NOTE — H&P (Signed)
H and P has been reviewed and no changes are noted.  

## 2016-05-13 NOTE — Anesthesia Procedure Notes (Signed)
Procedure Name: LMA Insertion Date/Time: 05/13/2016 7:43 AM Performed by: Rockne Coons Pre-anesthesia Checklist: Patient identified, Emergency Drugs available, Suction available, Patient being monitored and Timeout performed Patient Re-evaluated:Patient Re-evaluated prior to inductionOxygen Delivery Method: Circle system utilized Preoxygenation: Pre-oxygenation with 100% oxygen Intubation Type: IV induction Ventilation: Mask ventilation without difficulty LMA: LMA inserted LMA Size: 5.0 Number of attempts: 1 Placement Confirmation: CO2 detector Tube secured with: Tape Dental Injury: Teeth and Oropharynx as per pre-operative assessment

## 2016-05-13 NOTE — Anesthesia Post-op Follow-up Note (Cosign Needed)
Anesthesia QCDR form completed.        

## 2016-05-13 NOTE — Anesthesia Preprocedure Evaluation (Addendum)
Anesthesia Evaluation  Patient identified by MRN, date of birth, ID band Patient awake    Reviewed: Allergy & Precautions, NPO status , Patient's Chart, lab work & pertinent test results, reviewed documented beta blocker date and time   Airway Mallampati: II  TM Distance: >3 FB     Dental  (+) Chipped   Pulmonary former smoker,           Cardiovascular Exercise Tolerance: Poor hypertension ( no meds since 05/11/16 (norvasc)), Pt. on medications + CAD, + Past MI, + CABG and + Peripheral Vascular Disease  + Valvular Problems/Murmurs ( s/p porcine valve replacement. )      Neuro/Psych  Neuromuscular disease CVA    GI/Hepatic   Endo/Other  diabetes, Type 2  Renal/GU Renal disease     Musculoskeletal   Abdominal   Peds  Hematology  (+) anemia ,   Anesthesia Other Findings   Reproductive/Obstetrics                            Anesthesia Physical Anesthesia Plan  ASA: III  Anesthesia Plan: General   Post-op Pain Management:    Induction: Intravenous  Airway Management Planned: LMA  Additional Equipment:   Intra-op Plan:   Post-operative Plan:   Informed Consent: I have reviewed the patients History and Physical, chart, labs and discussed the procedure including the risks, benefits and alternatives for the proposed anesthesia with the patient or authorized representative who has indicated his/her understanding and acceptance.     Plan Discussed with: CRNA  Anesthesia Plan Comments:         Anesthesia Quick Evaluation Lab Results  Component Value Date   WBC 10.4 03/30/2016   HGB 12.2 (L) 03/30/2016   HCT 35.8 (L) 03/30/2016   MCV 88.3 03/30/2016   PLT 404 03/30/2016     Chemistry      Component Value Date/Time   NA 133 (L) 05/13/2016 0651   K 4.0 05/13/2016 0651   CL 103 05/13/2016 0651   CO2 23 05/13/2016 0651   BUN 18 05/13/2016 0651   CREATININE 1.23 05/13/2016  0651      Component Value Date/Time   CALCIUM 9.3 05/13/2016 0651   ALKPHOS 57 11/19/2015 1111   AST 21 11/19/2015 1111   ALT 17 11/19/2015 1111   BILITOT 0.4 11/19/2015 1111     No meds since 05/11/16. Chewing tobacco. None today. Very poor dentition. Denies loose teeth.

## 2016-05-13 NOTE — Anesthesia Postprocedure Evaluation (Signed)
Anesthesia Post Note  Patient: SEMIR BRILL  Procedure(s) Performed: Procedure(s) (LRB): OSTECTOMY (Left) IRRIGATION AND DEBRIDEMENT FOOT (Left)  Patient location during evaluation: PACU Anesthesia Type: General Level of consciousness: awake and alert Pain management: pain level controlled Vital Signs Assessment: post-procedure vital signs reviewed and stable Respiratory status: spontaneous breathing, nonlabored ventilation, respiratory function stable and patient connected to nasal cannula oxygen Cardiovascular status: blood pressure returned to baseline and stable Postop Assessment: no signs of nausea or vomiting Anesthetic complications: no     Last Vitals:  Vitals:   05/13/16 1000 05/13/16 1019  BP: 138/76 138/76  Pulse: 77 77  Resp:    Temp:      Last Pain:  Vitals:   05/13/16 0945  TempSrc: Temporal  PainSc:                  Suraj Ramdass S

## 2016-05-13 NOTE — Transfer of Care (Signed)
Immediate Anesthesia Transfer of Care Note  Patient: Matthew Bruce  Procedure(s) Performed: Procedure(s): OSTECTOMY (Left) IRRIGATION AND DEBRIDEMENT FOOT (Left)  Patient Location: PACU  Anesthesia Type:General  Level of Consciousness: awake, patient cooperative and responds to stimulation  Airway & Oxygen Therapy: Patient Spontanous Breathing  Post-op Assessment: Report given to RN, Post -op Vital signs reviewed and stable and Patient moving all extremities  Post vital signs: Reviewed and stable  Last Vitals:  Vitals:   05/13/16 0705  BP: 121/68  Pulse: (!) 107  Resp: 18  Temp: 36.7 C    Last Pain:  Vitals:   05/13/16 0705  TempSrc: Oral         Complications: No apparent anesthesia complications

## 2016-05-13 NOTE — Discharge Instructions (Signed)
[place] DR. [place]   1. Take your medication as prescribed.  Pain medication should be taken only as needed.  2. Keep the dressing clean, dry and intact.  3. Keep your foot elevated on pillow only in bed.  Not above heart.  Walking to the bathroom and brief periods of walking are acceptable but no more than a few minutes for the first 3 days 4. Do not take a shower or get the foot or bandage wet. 5. Every hour you are awake:  - Bend your knee 15 times. - Flex foot 15 times - Massage calf 15 times  6. Call [facility] [phone]) if any of the following problems occur: - You develop a temperature or fever. - The bandage becomes saturated with blood. - Medication does not stop your pain. - Injury of the foot occurs. - Any symptoms of infection including redness, odor, or red streaks running from wound.   Resume Oral Diabetic Medications on arrival to home.  AMBULATORY SURGERY  DISCHARGE INSTRUCTIONS   1) The drugs that you were given will stay in your system until tomorrow so for the next 24 hours you should not:  A) Drive an automobile B) Make any legal decisions C) Drink any alcoholic beverage   2) You may resume regular meals tomorrow.  Today it is better to start with liquids and gradually work up to solid foods.  You may eat anything you prefer, but it is better to start with liquids, then soup and crackers, and gradually work up to solid foods.   3) Please notify your doctor immediately if you have any unusual bleeding, trouble breathing, redness and pain at the surgery site, drainage, fever, or pain not relieved by medication.    4) Additional Instructions:        Please contact your physician with any problems or Same Day Surgery at [phone], Monday through Friday 6 am to 4 pm, or [place] at [facility]  number at [phone]. -

## 2016-05-13 NOTE — Op Note (Signed)
Operative note   Surgeon: Dr. Albertine Patricia, DPM.    Assistant: None    Preop diagnosis: 1. Chronic nonhealing wound to third toe amputation site right foot with possible osteomyelitis. 2. Diabetic ulceration fifth metatarsal head left foot involving skin and subcutaneous tissue 3. Diabetic ulcer distal right great toe left foot with skin and subcutaneous taste tissue involvement    Postop diagnosis: Same    Procedure:   1. Resection of third metatarsal head and debridement of skin and soft tissue muscle and tendon from the region of the third metatarsal amputation site. Delayed primary closure to this site.   2. Debridement of diabetic ulceration left fifth metatarsal head and wound site preparation with application of Apligraf. 4 squared centimeters were applied.   3. Debridement of diabetic ulceration to the left great toe with wound preparation and application of Apligraf to 3 cm applied.     EBL: 15 cc    Anesthesia:general delivered by the anesthesia team with an LMA. I blocked the surgical sites with 0.5% Marcaine plain utilized 8 cc preoperatively.    Hemostasis: None    Specimen: Bone culture of the third metatarsal head the rest of the bone was sent for pathology    Complications: None    Operative indications: Chronic nonhealing ulceration to the third toe amputation site from November. Patient gangrene to that digit and progressed well for. Time developed a MRSA infection in the region and the wound dehisced. Also developed ulcerations to the fifth metatarsal head and the distal great toe associated with diabetes and peripheral arterial disease.    Procedure:  Patient was brought into the OR and placed on the operating table in thesupine position. After anesthesia was obtained theleft lower extremity was prepped and draped in usual sterile fashion.  Operative Report: This time attention was directed to the dorsum of the left foot at the scar tissue over the third toe  amputation site. Patient had a dehisced area and not open wound approximately 2.5 cm x 1.8 cm x 2 cm deep. Incision was then made dorsally and plantarly extending from this open wound the wound was deepened until the metatarsal head was encountered. Metatarsal head resected approximately 2 cm proximal to the articular cartilage and removed. This was sent for culture as well as pathology. A versa jet was then used to remove all devitalized and fibrotic tissue from the wound. Any areas appear to be ptotic were removed. After copious irrigation the deep tissue was closed with 3-0 Vicryl simple interrupted sutures and skin was closed with 3-0 nylon simple interrupted sutures. The area was able to be closed primarily.  This time attention was directed to the fifth metatarsal head area where an ulceration measured 2.4 x 2 cm in length and width with 4 mm of depth. There was debrided with combination of curettage as well as the versa jet to good bleeding healthy tissue was achieved. This did progress to the subcutaneous tissue. This point a Apligraf was shaped to place across the area. The Apligraf was placed on the wound and sutured in with approximately 4 nylon sutures simple interrupted.  This time a similar procedure was performed on the distal left great toe. The wound was prepared and cleaned and debrided with versa jet and curettage. A portion of the Apligraf was then placed across wound which measured 7 mm x 8.2 mm with 3 mm of depth progressing to the simultaneous tissue as well. This portion of the graft was also sutured in  simple interrupted sutures.   At this point a sterile compressive dressings placed across there is consisting of Adaptic on the graft sites and Xeroform gauze across the incision margin of the third toe. 4 x 4's Kling and Kerlix care was taken not to wrap these areas too tightly.     Patient tolerated the procedure and anesthesia well.  Was transported from the OR to the PACU with all  vital signs stable and vascular status intact. To be discharged per routine protocol.  Will follow up in approximately 1 week in the outpatient clinic.

## 2016-05-13 NOTE — Progress Notes (Signed)
Post op shoe on

## 2016-05-16 DIAGNOSIS — S91302A Unspecified open wound, left foot, initial encounter: Secondary | ICD-10-CM | POA: Diagnosis not present

## 2016-05-16 LAB — SURGICAL PATHOLOGY

## 2016-05-18 LAB — AEROBIC/ANAEROBIC CULTURE (SURGICAL/DEEP WOUND)

## 2016-05-18 LAB — AEROBIC/ANAEROBIC CULTURE W GRAM STAIN (SURGICAL/DEEP WOUND)

## 2016-05-24 ENCOUNTER — Other Ambulatory Visit (INDEPENDENT_AMBULATORY_CARE_PROVIDER_SITE_OTHER): Payer: Self-pay | Admitting: Vascular Surgery

## 2016-05-25 ENCOUNTER — Emergency Department: Payer: Medicare HMO

## 2016-05-25 ENCOUNTER — Observation Stay
Admission: EM | Admit: 2016-05-25 | Discharge: 2016-05-30 | Disposition: A | Discharge status: Home / Self Care | Payer: Medicare HMO | Attending: Internal Medicine | Admitting: Internal Medicine

## 2016-05-25 DIAGNOSIS — R7989 Other specified abnormal findings of blood chemistry: Secondary | ICD-10-CM | POA: Diagnosis present

## 2016-05-25 DIAGNOSIS — G252 Other specified forms of tremor: Secondary | ICD-10-CM | POA: Insufficient documentation

## 2016-05-25 DIAGNOSIS — R918 Other nonspecific abnormal finding of lung field: Secondary | ICD-10-CM | POA: Diagnosis not present

## 2016-05-25 DIAGNOSIS — E1140 Type 2 diabetes mellitus with diabetic neuropathy, unspecified: Secondary | ICD-10-CM | POA: Diagnosis not present

## 2016-05-25 DIAGNOSIS — I252 Old myocardial infarction: Secondary | ICD-10-CM | POA: Insufficient documentation

## 2016-05-25 DIAGNOSIS — Z79899 Other long term (current) drug therapy: Secondary | ICD-10-CM | POA: Diagnosis not present

## 2016-05-25 DIAGNOSIS — E1152 Type 2 diabetes mellitus with diabetic peripheral angiopathy with gangrene: Secondary | ICD-10-CM | POA: Diagnosis not present

## 2016-05-25 DIAGNOSIS — Z87891 Personal history of nicotine dependence: Secondary | ICD-10-CM | POA: Insufficient documentation

## 2016-05-25 DIAGNOSIS — E860 Dehydration: Principal | ICD-10-CM | POA: Insufficient documentation

## 2016-05-25 DIAGNOSIS — Z8614 Personal history of Methicillin resistant Staphylococcus aureus infection: Secondary | ICD-10-CM | POA: Insufficient documentation

## 2016-05-25 DIAGNOSIS — R1110 Vomiting, unspecified: Secondary | ICD-10-CM | POA: Diagnosis not present

## 2016-05-25 DIAGNOSIS — E785 Hyperlipidemia, unspecified: Secondary | ICD-10-CM | POA: Insufficient documentation

## 2016-05-25 DIAGNOSIS — N400 Enlarged prostate without lower urinary tract symptoms: Secondary | ICD-10-CM | POA: Insufficient documentation

## 2016-05-25 DIAGNOSIS — R109 Unspecified abdominal pain: Secondary | ICD-10-CM | POA: Diagnosis not present

## 2016-05-25 DIAGNOSIS — Z8673 Personal history of transient ischemic attack (TIA), and cerebral infarction without residual deficits: Secondary | ICD-10-CM | POA: Diagnosis not present

## 2016-05-25 DIAGNOSIS — Z7984 Long term (current) use of oral hypoglycemic drugs: Secondary | ICD-10-CM | POA: Diagnosis not present

## 2016-05-25 DIAGNOSIS — Z951 Presence of aortocoronary bypass graft: Secondary | ICD-10-CM | POA: Insufficient documentation

## 2016-05-25 DIAGNOSIS — R251 Tremor, unspecified: Secondary | ICD-10-CM | POA: Diagnosis not present

## 2016-05-25 DIAGNOSIS — R0602 Shortness of breath: Secondary | ICD-10-CM | POA: Diagnosis not present

## 2016-05-25 DIAGNOSIS — R410 Disorientation, unspecified: Secondary | ICD-10-CM | POA: Diagnosis not present

## 2016-05-25 DIAGNOSIS — I119 Hypertensive heart disease without heart failure: Secondary | ICD-10-CM | POA: Insufficient documentation

## 2016-05-25 DIAGNOSIS — D72829 Elevated white blood cell count, unspecified: Secondary | ICD-10-CM | POA: Diagnosis not present

## 2016-05-25 DIAGNOSIS — I2510 Atherosclerotic heart disease of native coronary artery without angina pectoris: Secondary | ICD-10-CM | POA: Diagnosis not present

## 2016-05-25 DIAGNOSIS — N179 Acute kidney failure, unspecified: Secondary | ICD-10-CM | POA: Insufficient documentation

## 2016-05-25 DIAGNOSIS — H409 Unspecified glaucoma: Secondary | ICD-10-CM | POA: Diagnosis not present

## 2016-05-25 DIAGNOSIS — Z953 Presence of xenogenic heart valve: Secondary | ICD-10-CM | POA: Insufficient documentation

## 2016-05-25 DIAGNOSIS — I739 Peripheral vascular disease, unspecified: Secondary | ICD-10-CM | POA: Diagnosis present

## 2016-05-25 DIAGNOSIS — R079 Chest pain, unspecified: Secondary | ICD-10-CM | POA: Diagnosis not present

## 2016-05-25 DIAGNOSIS — R911 Solitary pulmonary nodule: Secondary | ICD-10-CM

## 2016-05-25 DIAGNOSIS — E119 Type 2 diabetes mellitus without complications: Secondary | ICD-10-CM

## 2016-05-25 DIAGNOSIS — Z89429 Acquired absence of other toe(s), unspecified side: Secondary | ICD-10-CM | POA: Diagnosis not present

## 2016-05-25 DIAGNOSIS — R0600 Dyspnea, unspecified: Secondary | ICD-10-CM | POA: Diagnosis not present

## 2016-05-25 DIAGNOSIS — E872 Acidosis: Secondary | ICD-10-CM | POA: Insufficient documentation

## 2016-05-25 DIAGNOSIS — Z7902 Long term (current) use of antithrombotics/antiplatelets: Secondary | ICD-10-CM | POA: Insufficient documentation

## 2016-05-25 DIAGNOSIS — I10 Essential (primary) hypertension: Secondary | ICD-10-CM | POA: Diagnosis present

## 2016-05-25 DIAGNOSIS — R531 Weakness: Secondary | ICD-10-CM

## 2016-05-25 DIAGNOSIS — L8990 Pressure ulcer of unspecified site, unspecified stage: Secondary | ICD-10-CM | POA: Insufficient documentation

## 2016-05-25 LAB — CBC WITH DIFFERENTIAL/PLATELET
BLASTS: 0 %
Band Neutrophils: 3 %
Basophils Absolute: 0 10*3/uL (ref 0–0.1)
Basophils Relative: 0 %
EOS ABS: 0 10*3/uL (ref 0–0.7)
Eosinophils Relative: 0 %
HEMATOCRIT: 35.8 % — AB (ref 40.0–52.0)
HEMOGLOBIN: 12.3 g/dL — AB (ref 13.0–18.0)
LYMPHS PCT: 15 %
Lymphs Abs: 0.7 10*3/uL — ABNORMAL LOW (ref 1.0–3.6)
MCH: 29.9 pg (ref 26.0–34.0)
MCHC: 34.2 g/dL (ref 32.0–36.0)
MCV: 87.4 fL (ref 80.0–100.0)
MONOS PCT: 0 %
Metamyelocytes Relative: 1 %
Monocytes Absolute: 0 10*3/uL — ABNORMAL LOW (ref 0.2–1.0)
Myelocytes: 0 %
NEUTROS PCT: 81 %
NRBC: 0 /100{WBCs}
Neutro Abs: 4.1 10*3/uL (ref 1.4–6.5)
OTHER: 0 %
PROMYELOCYTES ABS: 0 %
Platelets: 313 10*3/uL (ref 150–440)
RBC: 4.1 MIL/uL — AB (ref 4.40–5.90)
RDW: 14.8 % — ABNORMAL HIGH (ref 11.5–14.5)
WBC: 4.8 10*3/uL (ref 3.8–10.6)

## 2016-05-25 LAB — COMPREHENSIVE METABOLIC PANEL
ALT: 12 U/L — ABNORMAL LOW (ref 17–63)
ANION GAP: 11 (ref 5–15)
AST: 30 U/L (ref 15–41)
Albumin: 3.6 g/dL (ref 3.5–5.0)
Alkaline Phosphatase: 72 U/L (ref 38–126)
BUN: 18 mg/dL (ref 6–20)
CHLORIDE: 106 mmol/L (ref 101–111)
CO2: 21 mmol/L — ABNORMAL LOW (ref 22–32)
Calcium: 9.4 mg/dL (ref 8.9–10.3)
Creatinine, Ser: 1.38 mg/dL — ABNORMAL HIGH (ref 0.61–1.24)
GFR calc Af Amer: 51 mL/min — ABNORMAL LOW (ref >60)
GFR calc non Af Amer: 44 mL/min — ABNORMAL LOW (ref >60)
Glucose, Bld: 157 mg/dL — ABNORMAL HIGH (ref 65–99)
POTASSIUM: 3.8 mmol/L (ref 3.5–5.1)
Sodium: 138 mmol/L (ref 135–145)
TOTAL PROTEIN: 6.9 g/dL (ref 6.5–8.1)
Total Bilirubin: 0.5 mg/dL (ref 0.3–1.2)

## 2016-05-25 LAB — LACTIC ACID, PLASMA
Lactic Acid, Venous: 4.4 mmol/L (ref 0.5–1.9)
Lactic Acid, Venous: 3.8 mmol/L (ref 0.5–1.9)

## 2016-05-25 LAB — LIPASE, BLOOD: LIPASE: 32 U/L (ref 11–51)

## 2016-05-25 LAB — TROPONIN I

## 2016-05-25 LAB — CK: CK TOTAL: 23 U/L — AB (ref 49–397)

## 2016-05-25 LAB — BRAIN NATRIURETIC PEPTIDE: B Natriuretic Peptide: 190 pg/mL — ABNORMAL HIGH (ref 0.0–100.0)

## 2016-05-25 LAB — GLUCOSE, CAPILLARY: Glucose-Capillary: 115 mg/dL — ABNORMAL HIGH (ref 65–99)

## 2016-05-25 MED ORDER — ONDANSETRON HCL 4 MG/2ML IJ SOLN
4.0000 mg | Freq: Once | INTRAMUSCULAR | Status: AC
  Administered 2016-05-25: 4 mg via INTRAVENOUS
  Filled 2016-05-25: qty 2

## 2016-05-25 MED ORDER — DEXTROSE 5 % IV SOLN: 1.0000 g | Freq: Once | INTRAVENOUS | Status: DC

## 2016-05-25 MED ORDER — SODIUM CHLORIDE 0.9 % IV BOLUS (SEPSIS)
1000.0000 mL | Freq: Once | INTRAVENOUS | Status: AC
  Administered 2016-05-25: 1000 mL via INTRAVENOUS

## 2016-05-25 MED ORDER — DEXTROSE 5 % IV SOLN
500.0000 mg | Freq: Once | INTRAVENOUS | Status: AC
  Administered 2016-05-26: 500 mg via INTRAVENOUS
  Filled 2016-05-25: qty 500

## 2016-05-25 MED ORDER — IOPAMIDOL (ISOVUE-300) INJECTION 61%
80.0000 mL | Freq: Once | INTRAVENOUS | Status: AC | PRN
  Administered 2016-05-25: 80 mL via INTRAVENOUS

## 2016-05-25 MED ORDER — CEFTRIAXONE SODIUM-DEXTROSE 1-3.74 GM-% IV SOLR
1.0000 g | Freq: Once | INTRAVENOUS | Status: AC
  Administered 2016-05-25: 1 g via INTRAVENOUS
  Filled 2016-05-25: qty 50

## 2016-05-25 MED ORDER — IOPAMIDOL (ISOVUE-300) INJECTION 61%
30.0000 mL | Freq: Once | INTRAVENOUS | Status: AC | PRN
  Administered 2016-05-25: 30 mL via ORAL

## 2016-05-25 NOTE — ED Notes (Signed)
Pt was unable to get into car when DC, not able to stand well on own or move legs well. Per family this is not pt baseline. Pt brought back into ED.

## 2016-05-25 NOTE — ED Provider Notes (Signed)
[facility] Emergency Department Provider Note  ____________________________________________   First MD Initiated Contact with Patient 05/25/16 1626     (approximate)  I have reviewed the triage vital signs and the nursing notes.   HISTORY  Chief Complaint Emesis  History is somewhat limited by the patient's advanced age.  HPI Matthew Bruce is a 83 y.o. male who comes to the emergency department with several days of new onset tremors in bilateral upper extremities greater than bilateral lower extremities. He has a past medical history of coronary artery disease, and diabetes 2 weeks ago had an amputation of his left third toe. He denies headache. He denies numbness or weakness. He denies chest pain or shortness of breath. He has thrown up several times today.He denies abdominal surgical history.   Past Medical History:  Diagnosis Date  . Anemia   . Aortic valve disorder   . Coronary artery disease   . Diabetes mellitus without complication ([place])   . Glaucoma   . History of BPH   . History of kidney stones   . HOH (hard of hearing)    Bilateral Hearing Aids  . Hyperlipidemia   . Hypertension   . Kidney stones   . MRSA (methicillin resistant staph aureus) culture positive 04/01/2016   LEFT FOOT  . Myocardial infarction   . Neuropathy ([place])   . Peripheral vascular disease [facility])     Patient Active Problem List   Diagnosis Date Noted  . PAD (peripheral artery disease) ([place]) 03/24/2016  . Foot ulceration, left, with necrosis of muscle ([place]) 03/24/2016  . Essential hypertension 02/02/2016  . Diabetes ([place]) 02/02/2016  . Atherosclerosis of native arteries of the extremities with gangrene ([place]) 02/02/2016  . Valvular heart disease 02/02/2016  . Embolic stroke involving right cerebellar artery ([place]) 11/10/2014  . Hypoglycemia 11/05/2014    Past Surgical History:  Procedure Laterality Date  . AMPUTATION TOE Left 02/11/2016   Procedure:  AMPUTATION TOE;  Surgeon: Albertine Patricia, DPM;  Location: [facility] ORS;  Service: Podiatry;  Laterality: Left;  . APPLICATION OF WOUND VAC Left 04/01/2016   Procedure: APPLICATION OF WOUND VAC;  Surgeon: Albertine Patricia, DPM;  Location: [facility] ORS;  Service: Podiatry;  Laterality: Left;  . CARDIAC CATHETERIZATION    . CARDIAC SURGERY    . CARDIAC VALVE REPLACEMENT    . CORONARY ARTERY BYPASS GRAFT    . IRRIGATION AND DEBRIDEMENT FOOT Left 04/01/2016   Procedure: IRRIGATION AND DEBRIDEMENT FOOT;  Surgeon: Albertine Patricia, DPM;  Location: [facility] ORS;  Service: Podiatry;  Laterality: Left;  . IRRIGATION AND DEBRIDEMENT FOOT Left 05/13/2016   Procedure: IRRIGATION AND DEBRIDEMENT FOOT;  Surgeon: Albertine Patricia, DPM;  Location: [facility] ORS;  Service: Podiatry;  Laterality: Left;  . OSTECTOMY Left 05/13/2016   Procedure: OSTECTOMY;  Surgeon: Albertine Patricia, DPM;  Location: [facility] ORS;  Service: Podiatry;  Laterality: Left;  . PERIPHERAL VASCULAR CATHETERIZATION Left 02/03/2016   Procedure: Lower Extremity Angiography;  Surgeon: Algernon Huxley, MD;  Location: [place] [facility];  Service: Cardiovascular;  Laterality: Left;  . PERIPHERAL VASCULAR CATHETERIZATION Left 04/01/2016   Procedure: Lower Extremity Angiography;  Surgeon: Katha Cabal, MD;  Location: [place] [facility];  Service: Cardiovascular;  Laterality: Left;  Marland Kitchen VALVE REPLACEMENT  2007   Aortic Valve Replacement, St[employer] Jude Porcine Valve    Prior to Admission medications   Medication Sig Start Date End Date Taking? Authorizing Provider  amLODipine (NORVASC) 5 MG tablet Take 1 tablet (5 mg  total) by mouth daily. Patient taking differently: Take 5 mg by mouth at bedtime.  11/06/14   Aldean Jewett, MD  clopidogrel (PLAVIX) 75 MG tablet TAKE 1 TABLET BY MOUTH EVERY DAY 05/24/16   Algernon Huxley, MD  dorzolamide (TRUSOPT) 2 % ophthalmic solution Place 1 drop into both eyes at bedtime.     Historical Provider, MD  finasteride (PROSCAR) 5 MG tablet Take 5  mg by mouth at bedtime.     Historical Provider, MD  glipiZIDE (GLUCOTROL XL) 5 MG 24 hr tablet Take 5 mg by mouth daily. 09/06/15 09/05/16  Historical Provider, MD  HYDROcodone-acetaminophen (NORCO) 5-325 MG tablet Take 1 tablet by mouth every 6 (six) hours as needed for moderate pain. 05/13/16   Albertine Patricia, DPM  ibuprofen (ADVIL,MOTRIN) 800 MG tablet Take 800 mg by mouth every 8 (eight) hours as needed for moderate pain.     Historical Provider, MD  latanoprost (XALATAN) 0.005 % ophthalmic solution Place 1 drop into both eyes at bedtime.    Historical Provider, MD  lovastatin (MEVACOR) 40 MG tablet Take 40 mg by mouth every evening.     Historical Provider, MD  meclizine (ANTIVERT) 12.5 MG tablet Take 12.5 mg by mouth 4 (four) times daily as needed for dizziness.  02/21/16   Historical Provider, MD  metFORMIN (GLUCOPHAGE) 1000 MG tablet Take 1,000 mg by mouth 2 (two) times daily with a meal.    Historical Provider, MD  neomycin-bacitracin-polymyxin (NEOSPORIN) ointment Apply 1 application topically daily as needed for wound care. apply to eye    Historical Provider, MD  sulfamethoxazole-trimethoprim (BACTRIM DS,SEPTRA DS) 800-160 MG tablet Take 1 tablet by mouth daily. 7 day course started 05-03-2016 05/03/16   Historical Provider, MD  tamsulosin (FLOMAX) 0.4 MG CAPS capsule Take 0.'4mg'$  every NIGHT 07/02/15   Historical Provider, MD    Allergies Shellfish allergy  Family History  Problem Relation Age of Onset  . Heart attack Mother   . Alcohol abuse Father   . Throat cancer Sister   . Heart attack Brother   . Heart failure Neg Hx     Social History Social History  Substance Use Topics  . Smoking status: Former Smoker    Packs/day: 1.00    Types: Cigarettes  . Smokeless tobacco: Current User    Types: Chew  . Alcohol use No    Review of Systems Constitutional: No fever/chills Eyes: No visual changes. ENT: No sore throat. Cardiovascular: Denies chest pain. Respiratory: Denies  shortness of breath. Gastrointestinal: Reports abdominal pain.  Reports nausea, reports vomiting.  No diarrhea.  No constipation. Genitourinary: Negative for dysuria. Musculoskeletal: Negative for back pain. Skin: Negative for rash. Neurological: Negative for headaches, focal weakness or numbness. Reports tremors  10-point ROS otherwise negative.  ____________________________________________   PHYSICAL EXAM:  VITAL SIGNS: ED Triage Vitals  Enc Vitals Group     BP      Pulse      Resp      Temp      Temp src      SpO2      Weight      Height      Head Circumference      Peak Flow      Pain Score      Pain Loc      Pain Edu?      Excl. in [place]?     Constitutional: Alert and oriented 4 pleasant cooperative speaks slowly Eyes: Conjunctivae are normal. PERRL. EOMI.  Head: Atraumatic. Nose: No congestion/rhinnorhea. Mouth/Throat: Mucous membranes are moist.  Oropharynx non-erythematous. Neck: No stridor.   Cardiovascular: Tachycardic rate, regular rhythm. Grossly normal heart sounds.  Good peripheral circulation. Respiratory: Slightly elevated respiratory rate but lungs are clear Gastrointestinal: Soft and nontender. No distention. No abdominal bruits. No CVA tenderness. Musculoskeletal: Surgical wound to left foot healthy and healing well Neurologic:  Slow thoughtful speech. Pill-rolling tremor of the left hand at rest but normal strength and bulk throughout no cogwheeling normal finger-nose-finger although slightly worse on the right no pronator drift 5 out of 5 grips biceps triceps plantar flexion dorsiflexion Skin:  Skin is warm, dry and intact. No rash noted. Psychiatric: Mood and affect are normal. Speech and behavior are normal.  ____________________________________________   LABS (all labs ordered are listed, but only abnormal results are displayed)  Labs Reviewed  COMPREHENSIVE METABOLIC PANEL - Abnormal; Notable for the following:       Result Value   CO2 21  (*)    Glucose, Bld 157 (*)    Creatinine, Ser 1.38 (*)    ALT 12 (*)    GFR calc non Af Amer 44 (*)    GFR calc Af Amer 51 (*)    All other components within normal limits  CBC WITH DIFFERENTIAL/PLATELET - Abnormal; Notable for the following:    RBC 4.10 (*)    Hemoglobin 12.3 (*)    HCT 35.8 (*)    RDW 14.8 (*)    Lymphs Abs 0.7 (*)    Monocytes Absolute 0.0 (*)    All other components within normal limits  BRAIN NATRIURETIC PEPTIDE - Abnormal; Notable for the following:    B Natriuretic Peptide 190.0 (*)    All other components within normal limits  LACTIC ACID, PLASMA - Abnormal; Notable for the following:    Lactic Acid, Venous 4.4 (*)    All other components within normal limits  LACTIC ACID, PLASMA - Abnormal; Notable for the following:    Lactic Acid, Venous 3.8 (*)    All other components within normal limits  CK - Abnormal; Notable for the following:    Total CK 23 (*)    All other components within normal limits  GLUCOSE, CAPILLARY - Abnormal; Notable for the following:    Glucose-Capillary 115 (*)    All other components within normal limits  TROPONIN I  LIPASE, BLOOD  URINALYSIS, COMPLETE (UACMP) WITH MICROSCOPIC  URINALYSIS, COMPLETE (UACMP) WITH MICROSCOPIC   ____________________________________________  EKG  ED ECG REPORT I, Darel Hong, the attending physician, personally viewed and interpreted this ECG.  Date: 05/25/2016 Rate: 109 Rhythm: normal sinus rhythm QRS Axis: normal Intervals: Prolonged QTC ST/T Wave abnormalities: normal Conduction Disturbances: none Narrative Interpretation: Abnormal  ____________________________________________  RADIOLOGY  Chest x-ray is consistent with possible malignancy ____________________________________________   PROCEDURES  Procedure(s) performed: no  Procedures  Critical Care performed: no  ____________________________________________   INITIAL IMPRESSION / ASSESSMENT AND PLAN / ED  COURSE  Pertinent labs & imaging results that were available during my care of the patient were reviewed by me and considered in my medical decision making (see chart for details).  On arrival the patient is pleasant slightly elevated respiratory rate and tremulous. Given the emesis and no neuro symptoms and we'll CT his head and his abdomen as well as a chest x-ray and reevaluate.    ----------------------------------------- 10:09 PM on 05/25/2016 -----------------------------------------  The patient's heart rate has normalized and his lactate is down trending. His scans are unremarkable.  Family is comfortable having him go home. We'll refer him to neurology outpatient. He is medically stable for outpatient management. I've disclosed a chest x-ray to the patient's family and they understand that he may have lung cancer.   ----------------------------------------- 10:46 PM on 05/25/2016 -----------------------------------------  I initially offered the patient and family inpatient admission versus outpatient management and they opted to go home with outpatient follow-up. When they were trying to get the patient into the car however he felt more weak and was continually tremulous and they were unable to get him into the car so they brought him back to the emergency department. At this point I will cover him with a single dose of IV antibiotics and admitted to the hospital. ____________________________________________   FINAL CLINICAL IMPRESSION(S) / ED DIAGNOSES  Final diagnoses:  Dehydration  Coarse tremors      NEW MEDICATIONS STARTED DURING THIS VISIT:  New Prescriptions   No medications on file     Note:  This document was prepared using Dragon voice recognition software and may include unintentional dictation errors.     Darel Hong, MD 05/25/16 [phone]

## 2016-05-25 NOTE — ED Notes (Signed)
Pt taken to CT via stretcher.

## 2016-05-25 NOTE — ED Notes (Signed)
Pt made aware of need for urine, states he does not need to urinate at this time. Family at bedside. Pt drinking oral contrast at this time.

## 2016-05-25 NOTE — ED Notes (Signed)
Pt returned from [place] via strethcer

## 2016-05-25 NOTE — ED Triage Notes (Signed)
Pt arrives via [facility] from home. EMS states that pt had L 3rd toe amputation recently and "not healing right" so started on sulfa last night. Knows that pt took medication last night, unsure of today. Call to EMS was for 'difficulty breathing", reports FD told EMS pt was 88% on RA. Pt was 96-98% with them. Pt reports vomiting x 3 times today. EMS gave '4mg'$  zofran IM, pt has not vomiting since. Denies CP, abd pain. States some SOB. Pt has tremors normally but states worse x 3 days. Pt walks with walker because of toe surgery, walks with assistance for EMS.

## 2016-05-25 NOTE — ED Notes (Signed)
Son signed e-signature page, signature did not cross over.

## 2016-05-26 ENCOUNTER — Observation Stay: Payer: Medicare HMO

## 2016-05-26 DIAGNOSIS — I739 Peripheral vascular disease, unspecified: Secondary | ICD-10-CM | POA: Diagnosis not present

## 2016-05-26 DIAGNOSIS — R531 Weakness: Secondary | ICD-10-CM

## 2016-05-26 DIAGNOSIS — R918 Other nonspecific abnormal finding of lung field: Secondary | ICD-10-CM | POA: Diagnosis not present

## 2016-05-26 DIAGNOSIS — E119 Type 2 diabetes mellitus without complications: Secondary | ICD-10-CM | POA: Diagnosis not present

## 2016-05-26 DIAGNOSIS — R7989 Other specified abnormal findings of blood chemistry: Secondary | ICD-10-CM | POA: Diagnosis present

## 2016-05-26 LAB — URINALYSIS, COMPLETE (UACMP) WITH MICROSCOPIC
BACTERIA UA: NONE SEEN
BILIRUBIN URINE: NEGATIVE
GLUCOSE, UA: NEGATIVE mg/dL
HGB URINE DIPSTICK: NEGATIVE
Ketones, ur: NEGATIVE mg/dL
LEUKOCYTES UA: NEGATIVE
NITRITE: NEGATIVE
PH: 5 (ref 5.0–8.0)
Protein, ur: 30 mg/dL — AB
RBC / HPF: NONE SEEN RBC/hpf (ref 0–5)
SPECIFIC GRAVITY, URINE: 1.04 — AB (ref 1.005–1.030)
Squamous Epithelial / LPF: NONE SEEN

## 2016-05-26 LAB — CBC
HCT: 30 % — ABNORMAL LOW (ref 40.0–52.0)
Hemoglobin: 10.3 g/dL — ABNORMAL LOW (ref 13.0–18.0)
MCH: 29.5 pg (ref 26.0–34.0)
MCHC: 34.5 g/dL (ref 32.0–36.0)
MCV: 85.6 fL (ref 80.0–100.0)
PLATELETS: 261 10*3/uL (ref 150–440)
RBC: 3.5 MIL/uL — AB (ref 4.40–5.90)
RDW: 15 % — AB (ref 11.5–14.5)
WBC: 12.2 10*3/uL — AB (ref 3.8–10.6)

## 2016-05-26 LAB — BASIC METABOLIC PANEL
Anion gap: 7 (ref 5–15)
BUN: 23 mg/dL — ABNORMAL HIGH (ref 6–20)
CHLORIDE: 106 mmol/L (ref 101–111)
CO2: 20 mmol/L — ABNORMAL LOW (ref 22–32)
CREATININE: 1.86 mg/dL — AB (ref 0.61–1.24)
Calcium: 8.1 mg/dL — ABNORMAL LOW (ref 8.9–10.3)
GFR, EST AFRICAN AMERICAN: 36 mL/min — AB (ref >60)
GFR, EST NON AFRICAN AMERICAN: 31 mL/min — AB (ref >60)
Glucose, Bld: 201 mg/dL — ABNORMAL HIGH (ref 65–99)
POTASSIUM: 5 mmol/L (ref 3.5–5.1)
SODIUM: 133 mmol/L — AB (ref 135–145)

## 2016-05-26 LAB — GLUCOSE, CAPILLARY
GLUCOSE-CAPILLARY: 176 mg/dL — AB (ref 65–99)
Glucose-Capillary: 127 mg/dL — ABNORMAL HIGH (ref 65–99)
Glucose-Capillary: 190 mg/dL — ABNORMAL HIGH (ref 65–99)
Glucose-Capillary: 188 mg/dL — ABNORMAL HIGH (ref 65–99)

## 2016-05-26 LAB — LACTIC ACID, PLASMA: Lactic Acid, Venous: 2.7 mmol/L (ref 0.5–1.9)

## 2016-05-26 MED ORDER — LATANOPROST 0.005 % OP SOLN
1.0000 [drp] | Freq: Every day | OPHTHALMIC | Status: DC
  Administered 2016-05-26 – 2016-05-29 (×4): 1 [drp] via OPHTHALMIC
  Filled 2016-05-26: qty 2.5

## 2016-05-26 MED ORDER — SODIUM CHLORIDE 0.9 % IV SOLN
INTRAVENOUS | Status: AC
  Administered 2016-05-26: 03:00:00 via INTRAVENOUS

## 2016-05-26 MED ORDER — AZITHROMYCIN 500 MG PO TABS
500.0000 mg | ORAL_TABLET | Freq: Every day | ORAL | Status: DC
  Administered 2016-05-26 – 2016-05-27 (×2): 500 mg via ORAL
  Filled 2016-05-26 (×2): qty 1

## 2016-05-26 MED ORDER — DORZOLAMIDE HCL 2 % OP SOLN
1.0000 [drp] | Freq: Every day | OPHTHALMIC | Status: DC
  Administered 2016-05-26 – 2016-05-29 (×4): 1 [drp] via OPHTHALMIC
  Filled 2016-05-26: qty 10

## 2016-05-26 MED ORDER — INSULIN ASPART 100 UNIT/ML ~~LOC~~ SOLN
0.0000 [IU] | Freq: Three times a day (TID) | SUBCUTANEOUS | Status: DC
  Administered 2016-05-26 (×2): 2 [IU] via SUBCUTANEOUS
  Administered 2016-05-27: 3 [IU] via SUBCUTANEOUS
  Administered 2016-05-27: 2 [IU] via SUBCUTANEOUS
  Administered 2016-05-28: 13:00:00 3 [IU] via SUBCUTANEOUS
  Administered 2016-05-28: 09:00:00 2 [IU] via SUBCUTANEOUS
  Administered 2016-05-28 – 2016-05-29 (×3): 3 [IU] via SUBCUTANEOUS
  Administered 2016-05-29: 12:00:00 5 [IU] via SUBCUTANEOUS
  Administered 2016-05-30 (×2): 3 [IU] via SUBCUTANEOUS
  Filled 2016-05-26 (×2): qty 2
  Filled 2016-05-26 (×2): qty 3
  Filled 2016-05-26: qty 2
  Filled 2016-05-26: qty 5
  Filled 2016-05-26 (×3): qty 3
  Filled 2016-05-26: qty 2
  Filled 2016-05-26 (×2): qty 3

## 2016-05-26 MED ORDER — AMLODIPINE BESYLATE 5 MG PO TABS
5.0000 mg | ORAL_TABLET | Freq: Every day | ORAL | Status: DC
  Administered 2016-05-26 – 2016-05-29 (×4): 5 mg via ORAL
  Filled 2016-05-26 (×4): qty 1

## 2016-05-26 MED ORDER — HEPARIN SODIUM (PORCINE) 5000 UNIT/ML IJ SOLN
5000.0000 [IU] | Freq: Three times a day (TID) | INTRAMUSCULAR | Status: DC
  Administered 2016-05-26 – 2016-05-30 (×14): 5000 [IU] via SUBCUTANEOUS
  Filled 2016-05-26 (×14): qty 1

## 2016-05-26 MED ORDER — CEFTRIAXONE SODIUM-DEXTROSE 2-2.22 GM-% IV SOLR
2.0000 g | INTRAVENOUS | Status: DC
  Administered 2016-05-26 – 2016-05-27 (×2): 2 g via INTRAVENOUS
  Filled 2016-05-26 (×2): qty 50

## 2016-05-26 MED ORDER — HYDROCODONE-ACETAMINOPHEN 5-325 MG PO TABS
1.0000 | ORAL_TABLET | Freq: Four times a day (QID) | ORAL | Status: DC | PRN
Start: 2016-05-26 — End: 2016-05-30
  Administered 2016-05-27 – 2016-05-29 (×2): 1 via ORAL
  Filled 2016-05-26 (×2): qty 1

## 2016-05-26 MED ORDER — ONDANSETRON HCL 4 MG PO TABS: 4.0000 mg | ORAL_TABLET | Freq: Four times a day (QID) | ORAL | Status: DC | PRN

## 2016-05-26 MED ORDER — CLOPIDOGREL BISULFATE 75 MG PO TABS
75.0000 mg | ORAL_TABLET | Freq: Every day | ORAL | Status: DC
  Administered 2016-05-26 – 2016-05-30 (×5): 75 mg via ORAL
  Filled 2016-05-26 (×5): qty 1

## 2016-05-26 MED ORDER — PRAVASTATIN SODIUM 40 MG PO TABS
40.0000 mg | ORAL_TABLET | Freq: Every day | ORAL | Status: DC
  Administered 2016-05-26 – 2016-05-29 (×4): 40 mg via ORAL
  Filled 2016-05-26 (×4): qty 1

## 2016-05-26 MED ORDER — TAMSULOSIN HCL 0.4 MG PO CAPS
0.4000 mg | ORAL_CAPSULE | Freq: Every day | ORAL | Status: DC
  Administered 2016-05-26 – 2016-05-30 (×5): 0.4 mg via ORAL
  Filled 2016-05-26 (×5): qty 1

## 2016-05-26 MED ORDER — SODIUM CHLORIDE 0.9 % IV SOLN
INTRAVENOUS | Status: AC
  Administered 2016-05-26: 18:00:00 via INTRAVENOUS

## 2016-05-26 MED ORDER — ACETAMINOPHEN 325 MG PO TABS
650.0000 mg | ORAL_TABLET | Freq: Four times a day (QID) | ORAL | Status: DC | PRN
  Administered 2016-05-27 – 2016-05-29 (×4): 650 mg via ORAL
  Filled 2016-05-26 (×4): qty 2

## 2016-05-26 MED ORDER — INSULIN ASPART 100 UNIT/ML ~~LOC~~ SOLN
0.0000 [IU] | Freq: Every day | SUBCUTANEOUS | Status: DC
  Administered 2016-05-28: 3 [IU] via SUBCUTANEOUS
  Administered 2016-05-29: 21:00:00 2 [IU] via SUBCUTANEOUS
  Filled 2016-05-26: qty 2
  Filled 2016-05-26: qty 3

## 2016-05-26 MED ORDER — ONDANSETRON HCL 4 MG/2ML IJ SOLN: 4.0000 mg | Freq: Four times a day (QID) | INTRAMUSCULAR | Status: DC | PRN

## 2016-05-26 MED ORDER — FINASTERIDE 5 MG PO TABS
5.0000 mg | ORAL_TABLET | Freq: Every day | ORAL | Status: DC
  Administered 2016-05-26 – 2016-05-29 (×4): 5 mg via ORAL
  Filled 2016-05-26 (×4): qty 1

## 2016-05-26 MED ORDER — ACETAMINOPHEN 650 MG RE SUPP: 650.0000 mg | Freq: Four times a day (QID) | RECTAL | Status: DC | PRN

## 2016-05-26 NOTE — Evaluation (Signed)
Physical Therapy Evaluation Patient Details Name: Matthew Bruce MRN: [id number] DOB: [birth year] Today's Date: 05/26/2016   History of Present Illness  Pt is an 83 y.o. male presenting to hospital with new onset tremors B UE's>LE's, weakness, and confusion (pt discharged home from ED but unable to get into car and brought back to ED).  Pt admitted with elevated lactic acid level.  PMH includes s/p amputation L 3rd toe (s/p ostectomy L foot and I&D L foot 05/13/16), CAD, DM, neuropathy, MI, htn, HOH.  Clinical Impression  Prior to hospital admission, pt was ambulating short distances with walker (wearing L post op shoe d/t multiple surgeries on L foot recently).  Pt lives with his wife in 1 level home with 4 steps to enter with B railings.  Currently pt is mod assist supine to sit; fluctuated during session with transfer assist levels (max to mod to min) with RW; and ambulated a few feet commode to recliner with RW (limited distance d/t pt fatigue).  Pt would benefit from skilled PT to address noted impairments and functional limitations.  Recommend pt discharge to STR when medically appropriate.    Follow Up Recommendations SNF    Equipment Recommendations  Rolling walker with 5" wheels    Recommendations for Other Services       Precautions / Restrictions Precautions Precautions: Fall Restrictions Weight Bearing Restrictions: Yes Other Position/Activity Restrictions: Per pt's family: pt to always wear post op shoe on L foot when up (and pt does not tend to WB on front of L foot d/t pain)      Mobility  Bed Mobility Overal bed mobility: Needs Assistance Bed Mobility: Supine to Sit     Supine to sit: Mod assist;HOB elevated     General bed mobility comments: assist for trunk supine to sit  Transfers Overall transfer level: Needs assistance Equipment used: Rolling walker (2 wheeled) Transfers: Sit to/from [facility] Sit to Stand: Min assist;Mod assist;Max  assist Stand pivot transfers: Min assist;Mod assist;+2 physical assistance (bed to commode (increased assist d/t pt incontinent of bowel during transfer requiring extra assist for safety))       General transfer comment: 1st trial from bed (with multiple attempts) pt max assist to stand but then pt with posterior lean requiring assist to safely sit back down onto bed; 2nd stand pt mod assist standing from bed; 3rd trial standing from commode pt mod assist; 4th trial standing from recliner pt mod assist; 5th and 6th trial standing from recliner pt min assist; pt requiring vc's for hand and feet placement; pt using momentum and rocking (multiple attempts) to stand each trial  Ambulation/Gait Ambulation/Gait assistance: Min assist;Mod assist;+2 safety/equipment Ambulation Distance (Feet): 3 Feet (commode to recliner) Assistive device: Rolling walker (2 wheeled)   Gait velocity: decreased   General Gait Details: decreased B step length; B knees flexing with fatigue (although no buckling noted)  Stairs            Wheelchair Mobility    Modified Rankin (Stroke Patients Only)       Balance Overall balance assessment: Needs assistance Sitting-balance support: Bilateral upper extremity supported;Feet supported Sitting balance-Leahy Scale: Poor Sitting balance - Comments: intermittent posterior lean requiring assist to correct   Standing balance support: Bilateral upper extremity supported Standing balance-Leahy Scale: Poor Standing balance comment: posterior lean initially standing (pt falling backwards) requiring max assist to sit safely onto edge of bed; min assist standing with RW for clean-up post toileting  Pertinent Vitals/Pain Pain Assessment: No/denies pain  Vitals (HR and O2 on room air) stable and WFL throughout treatment session.    Home Living Family/patient expects to be discharged to:: Private residence Living Arrangements:  Spouse/significant other Available Help at Discharge: Family Type of Home: House Home Access: Stairs to enter Entrance Stairs-Rails: Right;Left;Can reach both Entrance Stairs-Number of Steps: [place]: One level Home Equipment: [place] - 4 wheels;Cane - single point      Prior Function Level of Independence: Independent with assistive device(s)         Comments: Pt's family reports pt walks short distances in home with walker.  2 falls in past 6 months.  Independent with ADL's.     Hand Dominance        Extremity/Trunk Assessment   Upper Extremity Assessment Upper Extremity Assessment: Generalized weakness    Lower Extremity Assessment Lower Extremity Assessment: Generalized weakness       Communication   Communication: HOH  Cognition Arousal/Alertness: Awake/alert Behavior During Therapy: WFL for tasks assessed/performed Overall Cognitive Status: Within Functional Limits for tasks assessed                      General Comments   Nursing cleared pt for participation in physical therapy.  Pt agreeable to PT session.  Pt's son, daughter, and son-in-law present beginning of session but left during session.    Exercises  Transfer training   Assessment/Plan    PT Assessment Patient needs continued PT services  PT Problem List Decreased strength;Decreased activity tolerance;Decreased balance;Decreased mobility       PT Treatment Interventions DME instruction;Gait training;Stair training;Functional mobility training;Therapeutic activities;Therapeutic exercise;Balance training;Patient/family education    PT Goals (Current goals can be found in the Care Plan section)  Acute Rehab PT Goals Patient Stated Goal: to get stronger PT Goal Formulation: With patient Time For Goal Achievement: 06/09/16 Potential to Achieve Goals: Good    Frequency Min 2X/week   Barriers to discharge Decreased caregiver support      Co-evaluation                End of Session Equipment Utilized During Treatment: Gait belt Activity Tolerance: Patient limited by fatigue Patient left: in chair;with call bell/phone within reach;with chair alarm set Nurse Communication: Mobility status;Precautions PT Visit Diagnosis: Difficulty in walking, not elsewhere classified (R26.2);Muscle weakness (generalized) (M62.81);History of falling (Z91.81)    Functional Assessment Tool Used: AM-PAC 6 Clicks Basic Mobility Functional Limitation: Mobility: Walking and moving around Mobility: Walking and Moving Around Current Status (O6712): At least 60 percent but less than 80 percent impaired, limited or restricted Mobility: Walking and Moving Around Goal Status [phone]): At least 1 percent but less than 20 percent impaired, limited or restricted    Time: 9833-8250 PT Time Calculation (min) (ACUTE ONLY): 43 min   Charges:   PT Evaluation $PT Eval Low Complexity: 1 Procedure PT Treatments $Therapeutic Activity: 23-37 mins   PT G Codes:   PT G-Codes **NOT FOR INPATIENT CLASS** Functional Assessment Tool Used: AM-PAC 6 Clicks Basic Mobility Functional Limitation: Mobility: Walking and moving around Mobility: Walking and Moving Around Current Status (N3976): At least 60 percent but less than 80 percent impaired, limited or restricted Mobility: Walking and Moving Around Goal Status [phone]): At least 1 percent but less than 20 percent impaired, limited or restricted     Leitha Bleak, PT 05/26/16, 5:13 PM [phone]

## 2016-05-26 NOTE — Care Management (Signed)
Admitted to [facility] with the diagnosis of elevated lactic acid level under observation status. Lives with wife. Daughter is Dondra Spry [phone]). Son is Nicole Kindred [phone]). [facility] is in the home for services. No skilled nursing. No home oxygen. Cane and rolling walker in the home. Prescriptions are filled at [facility]. Hard of hearing. Takes care of all basic activities of daily living himself. No falls. Good appetite. Family will transport. Shelbie Ammons RN MSN CCM Care Management

## 2016-05-26 NOTE — ED Notes (Signed)
Pt stated he needed to urinate, when this RN and Will[place] Ora EDT went into room, helped pt stand up with urinal, bed and underwear were wet. Placed wet underwear and pants into pt belongings bag, chuck on bed and brief on pt.

## 2016-05-26 NOTE — Plan of Care (Signed)
Problem: Skin Integrity: Goal: Risk for impaired skin integrity will decrease Outcome: Not Progressing Pt s/p left third toe amputation. Sutures intact. Dressing CD&I. Noted pressure injury to right great toe unstageable with eschar. Rocephin 2 gm ordered IV. Note place to MD to address whether pt needs podiatry to f/u or wound nurse consultation.

## 2016-05-26 NOTE — Progress Notes (Addendum)
[place] at [place] NAME: Gunner Iodice    MR#:  [id number]  DATE OF BIRTH:  [birth year]  SUBJECTIVE:  CHIEF COMPLAINT:   Chief Complaint  Patient presents with  . Emesis   The patient has no complaints. REVIEW OF SYSTEMS:  Review of Systems  Constitutional: Negative.   HENT: Negative.   Eyes: Negative.   Respiratory: Negative.   Cardiovascular: Negative.   Gastrointestinal: Negative.   Genitourinary: Negative.   Musculoskeletal: Negative.   Skin: Negative.   Neurological: Negative.   Psychiatric/Behavioral: Negative.     DRUG ALLERGIES:   Allergies  Allergen Reactions  . Shellfish Allergy Hives and Rash    Patient denies allergy (03/30/16)   VITALS:  Blood pressure (!) 103/53, pulse 98, temperature 97.7 F (36.5 C), temperature source Oral, resp. rate 20, height 6' (1.829 m), weight 196 lb 1.6 oz (89 kg), SpO2 99 %. PHYSICAL EXAMINATION:  Physical Exam  Constitutional: He is oriented to person, place, and time and well-developed, well-nourished, and in no distress.  HENT:  Head: Normocephalic.  Dry oral mucosa  Eyes: Conjunctivae and EOM are normal.  Neck: Normal range of motion. Neck supple. No JVD present. No tracheal deviation present.  Cardiovascular: Normal rate, regular rhythm and normal heart sounds.  Exam reveals no gallop.   No murmur heard. Pulmonary/Chest: Effort normal. No respiratory distress. He has no wheezes. He has no rales.  Abdominal: Soft. Bowel sounds are normal. He exhibits no distension. There is no tenderness.  Musculoskeletal: Normal range of motion. He exhibits no edema or tenderness.  Neurological: He is alert and oriented to person, place, and time. No cranial nerve deficit.  Skin: No rash noted. No erythema.  Psychiatric: Affect normal.   LABORATORY PANEL:  Male CBC  Recent Labs Lab 05/26/16 0442  WBC 12.2*  HGB 10.3*  HCT 30.0*  PLT 261    ------------------------------------------------------------------------------------------------------------------ Chemistries   Recent Labs Lab 05/25/16 1640 05/26/16 0442  NA 138 133*  K 3.8 5.0  CL 106 106  CO2 21* 20*  GLUCOSE 157* 201*  BUN 18 23*  CREATININE 1.38* 1.86*  CALCIUM 9.4 8.1*  AST 30  --   ALT 12*  --   ALKPHOS 72  --   BILITOT 0.5  --    RADIOLOGY:  Ct Head Wo Contrast  Result Date: 05/25/2016 CLINICAL DATA:  New tremor, left upper extremity weakness EXAM: CT HEAD WITHOUT CONTRAST TECHNIQUE: Contiguous axial images were obtained from the base of the skull through the vertex without intravenous contrast. COMPARISON:  11/19/2015 and CT scan 11/04/2014 FINDINGS: Brain: No intracranial hemorrhage, mass effect or midline shift. Stable moderate cerebral and cerebellar atrophy. Stable periventricular and patchy subcortical chronic white matter disease. No definite acute cortical infarction. No mass lesion is noted on this unenhanced scan. Vascular: Atherosclerotic calcifications of carotid siphon again noted. Skull: No skull fracture is noted. Sinuses/Orbits: There is significant mucosal thickening with almost complete opacification of the right maxillary sinus. Mucosal thickening with partial opacification of bilateral ethmoid air cells. The mastoid air cells are unremarkable. Other: None IMPRESSION: No acute intracranial abnormality. Stable moderate cerebral atrophy. Stable cerebellar atrophy. Again noted periventricular and patchy subcortical chronic white matter disease. No definite acute cortical infarction. There is mucosal thickening with almost complete opacification of the right maxillary sinus. Mucosal thickening with partial opacification bilateral ethmoid air cells. Electronically Signed   By: Lahoma Crocker M.D.   On: 05/25/2016 17:10   Ct  Chest Wo Contrast  Result Date: 05/26/2016 CLINICAL DATA:  83 year old male with difficulty breathing and vomiting. EXAM: CT  CHEST WITHOUT CONTRAST TECHNIQUE: Multidetector CT imaging of the chest was performed following the standard protocol without IV contrast. COMPARISON:  Chest radiograph dated 05/25/2016 and 11/19/2015 FINDINGS: Evaluation of this exam is limited in the absence of intravenous contrast. Cardiovascular: There is mild cardiomegaly. No pericardial effusion. Advanced mild to vessel coronary vascular calcification and CABG procedure. There is advanced atherosclerotic disease of the thoracic aorta. There is mild ectasia of the aortic root measuring up to 4.4 cm. The central pulmonary arteries are unremarkable on this noncontrast study. Mediastinum/Nodes: There is no hilar or mediastinal adenopathy. The esophagus and thyroid gland are grossly unremarkable. No mediastinal fluid collection. Lungs/Pleura: There is a focal area of airspace opacity in the right upper lobe along the major fissure extending from the right suprahilar region into the right upper pleural surface there are some bronchograms within this consolidative area. Although this may represent focal area of infiltrate, underlying neoplasm is not excluded as this has been persistent since 11/19/2015 radiograph. PET-CT may provide better evaluation in differentiating a neoplastic process versus chronic changes. There bibasilar linear atelectasis/scarring. There is no pleural effusion or pneumothorax. The central airways are patent. Upper Abdomen: Right renal upper staghorn calculus. Ill-defined 2 cm upper pole as well as partially visualized interpolar hypodense lesions are not well characterized but likely represent cysts. There is advanced atherosclerotic calcification of the splenic artery. Musculoskeletal: Osteopenia with degenerative changes of the spine. Median sternotomy wires. No acute fracture. IMPRESSION: 1. Focal airspace opacity in the right upper lobe extending from the right supra hilar region into the pleural surface persistent since radiograph of  11/19/2015. Although this may be related to an infectious/ inflammatory process, neoplasm is a concern, given persistent over time. PET-CT may provide better differentiation between chronic inflammatory/fibrotic process versus neoplasm. 2. No hilar or mediastinal adenopathy. 3. Mild cardiomegaly with advanced multi vessel coronary vascular disease and CABG surgery. 4. Advanced atherosclerotic disease of the aorta. A 4.4 cm ectasia of the ascending aorta Ascending thoracic aortic aneurysm. Recommend semi-annual imaging followup by CTA or MRA and referral to cardiothoracic surgery if not already obtained. This recommendation follows 2010 ACCF/AHA/AATS/ACR/ASA/SCA/SCAI/SIR/STS/SVM Guidelines for the Diagnosis and Management of Patients With Thoracic Aortic Disease. Circulation. 2010; 121: O962-X528 Electronically Signed   By: Anner Crete M.D.   On: 05/26/2016 02:55   Ct Abdomen Pelvis W Contrast  Result Date: 05/25/2016 CLINICAL DATA:  Abdominal pain and vomiting for 3 days. EXAM: CT ABDOMEN AND PELVIS WITH CONTRAST TECHNIQUE: Multidetector CT imaging of the abdomen and pelvis was performed using the standard protocol following bolus administration of intravenous contrast. CONTRAST:  45m ISOVUE-300 IOPAMIDOL (ISOVUE-300) INJECTION 61% COMPARISON:  None. FINDINGS: Lower Chest: No acute findings. Hepatobiliary:  No masses identified. Gallbladder is unremarkable. Pancreas:  No mass or inflammatory changes. Spleen: Within normal limits in size and appearance. Adrenals/Urinary Tract: No masses identified. Bilateral renal cysts noted. Several small cysts renal calculi are seen bilaterally. No evidence of hydronephrosis. Unopacified urinary bladder is unremarkable in appearance. Stomach/Bowel: No evidence of obstruction, inflammatory process or abnormal fluid collections. Vascular/Lymphatic: No pathologically enlarged lymph nodes. No abdominal aortic aneurysm. Aortic atherosclerosis. Reproductive:  Mildly enlarged  prostate. Other:  None. Musculoskeletal:  No suspicious bone lesions identified. IMPRESSION: No acute findings. Nonobstructive bilateral renal calculi and renal cysts. No evidence hydronephrosis. Mildly enlarged prostate. Aortic atherosclerosis. Electronically Signed   By: JSharrie RothmanD.  On: 05/25/2016 18:30   Dg Chest Port 1 View  Result Date: 05/25/2016 CLINICAL DATA:  Shortness of breath EXAM: PORTABLE CHEST 1 VIEW COMPARISON:  11/19/2015 FINDINGS: Interstitial markings are diffusely coarsened with chronic features. Flame shaped opacity right upper lobe persist. Patient is status post CABG. Bones are diffusely demineralized. Telemetry leads overlie the chest. IMPRESSION: Persistence of focal opacity right upper lobe. Neoplasm a concern. Dedicated CT chest recommended to further evaluate. Electronically Signed   By: Misty Stanley M.D.   On: 05/25/2016 17:17   ASSESSMENT AND PLAN:   Lactic acidosis- patient has had significant vomiting and is dehydrated, but there is also some concern for possible infection. However, no UTI. Improved with IV fluid support.  Focal airspace opacity in the right upper lobe extending from the right supra hilar region into the pleural surface persistent since radiograph of 11/19/2015. Continue abx for now.  Pulmonary consult.   Leukocytosis. Follow-up CBC.    Weakness - suspect due to either profound dehydration, PT evaluation.  Acute renal failure. Creatinine is worsening, continue IV fluid support and follow-up BMP.    Essential hypertension - hold home antihypertensives for now as the patient's blood pressure is borderline low    Diabetes (HCC) - sliding scale insulin with corresponding glucose checks   PAD (peripheral artery disease) ([place]) - continue home meds  All the records are reviewed and case discussed with Care Management/Social Worker. Management plans discussed with the patient, his daughter and son,  and they are in agreement.  CODE  STATUS: Full Code  TOTAL TIME TAKING CARE OF THIS PATIENT: 33 minutes.   More than 50% of the time was spent in counseling/coordination of care: YES  POSSIBLE D/C IN 1-2 DAYS, DEPENDING ON CLINICAL CONDITION.   Demetrios Loll M.D on 05/26/2016 at 4:21 PM  Between 7am to 6pm - Pager - [phone]  After 6pm go to [email or web address] - Proofreader  [facility]  Office  [phone]  CC: Primary care physician; Madelyn Brunner, MD  Note: This dictation was prepared with Dragon dictation along with smaller phrase technology. Any transcriptional errors that result from this process are unintentional.

## 2016-05-26 NOTE — H&P (Signed)
[facility] NAME: Matthew Bruce    MR#:  [id number]  DATE OF BIRTH:  07/02/1926  DATE OF ADMISSION:  05/25/2016  PRIMARY CARE PHYSICIAN: Madelyn Brunner, MD   REQUESTING/REFERRING PHYSICIAN: Mable Paris, MD  CHIEF COMPLAINT:   Chief Complaint  Patient presents with  . Emesis    HISTORY OF PRESENT ILLNESS:  Matthew Bruce  is a 83 y.o. male who presents with Weakness, tremor, confusion. Patient came in to the ED for tremor weakness and confusion, and his initial workup was largely within normal limits except for an elevated lactic acid which was improving after fluid administration. He tried to go home from the ED, but became weaker and more confused before he could get to his car. He came back to the ED for further treatment. Hospitals were called for admission  PAST MEDICAL HISTORY:   Past Medical History:  Diagnosis Date  . Anemia   . Aortic valve disorder   . Coronary artery disease   . Diabetes mellitus without complication ([place])   . Glaucoma   . History of BPH   . History of kidney stones   . HOH (hard of hearing)    Bilateral Hearing Aids  . Hyperlipidemia   . Hypertension   . Kidney stones   . MRSA (methicillin resistant staph aureus) culture positive 04/01/2016   LEFT FOOT  . Myocardial infarction   . Neuropathy ([place])   . Peripheral vascular disease ([place])     PAST SURGICAL HISTORY:   Past Surgical History:  Procedure Laterality Date  . AMPUTATION TOE Left 02/11/2016   Procedure: AMPUTATION TOE;  Surgeon: Albertine Patricia, DPM;  Location: [facility];  Service: Podiatry;  Laterality: Left;  . APPLICATION OF WOUND VAC Left 04/01/2016   Procedure: APPLICATION OF WOUND VAC;  Surgeon: Albertine Patricia, DPM;  Location: [facility];  Service: Podiatry;  Laterality: Left;  . CARDIAC CATHETERIZATION    . CARDIAC SURGERY    . CARDIAC VALVE REPLACEMENT    . CORONARY ARTERY BYPASS GRAFT    . IRRIGATION AND  DEBRIDEMENT FOOT Left 04/01/2016   Procedure: IRRIGATION AND DEBRIDEMENT FOOT;  Surgeon: Albertine Patricia, DPM;  Location: [facility];  Service: Podiatry;  Laterality: Left;  . IRRIGATION AND DEBRIDEMENT FOOT Left 05/13/2016   Procedure: IRRIGATION AND DEBRIDEMENT FOOT;  Surgeon: Albertine Patricia, DPM;  Location: [facility];  Service: Podiatry;  Laterality: Left;  . OSTECTOMY Left 05/13/2016   Procedure: OSTECTOMY;  Surgeon: Albertine Patricia, DPM;  Location: [facility];  Service: Podiatry;  Laterality: Left;  . PERIPHERAL VASCULAR CATHETERIZATION Left 02/03/2016   Procedure: Lower Extremity Angiography;  Surgeon: Algernon Huxley, MD;  Location: [facility];  Service: Cardiovascular;  Laterality: Left;  . PERIPHERAL VASCULAR CATHETERIZATION Left 04/01/2016   Procedure: Lower Extremity Angiography;  Surgeon: Katha Cabal, MD;  Location: [facility];  Service: Cardiovascular;  Laterality: Left;  Marland Kitchen VALVE REPLACEMENT  2007   Aortic Valve Replacement, St[employer] Jude Porcine Valve    SOCIAL HISTORY:   Social History  Substance Use Topics  . Smoking status: Former Smoker    Packs/day: 1.00    Types: Cigarettes  . Smokeless tobacco: Current User    Types: Chew  . Alcohol use No    FAMILY HISTORY:   Family History  Problem Relation Age of Onset  . Heart attack Mother   . Alcohol abuse Father   . Throat cancer Sister   .  Heart attack Brother   . Heart failure Neg Hx     DRUG ALLERGIES:   Allergies  Allergen Reactions  . Shellfish Allergy Hives and Rash    Patient denies allergy (03/30/16)    MEDICATIONS AT HOME:   Prior to Admission medications   Medication Sig Start Date End Date Taking? Authorizing Provider  amLODipine (NORVASC) 5 MG tablet Take 1 tablet (5 mg total) by mouth daily. Patient taking differently: Take 5 mg by mouth at bedtime.  11/06/14   Aldean Jewett, MD  clopidogrel (PLAVIX) 75 MG tablet TAKE 1 TABLET BY MOUTH EVERY DAY 05/24/16   Algernon Huxley, MD   dorzolamide (TRUSOPT) 2 % ophthalmic solution Place 1 drop into both eyes at bedtime.     Historical Provider, MD  finasteride (PROSCAR) 5 MG tablet Take 5 mg by mouth at bedtime.     Historical Provider, MD  glipiZIDE (GLUCOTROL XL) 5 MG 24 hr tablet Take 5 mg by mouth daily. 09/06/15 09/05/16  Historical Provider, MD  HYDROcodone-acetaminophen (NORCO) 5-325 MG tablet Take 1 tablet by mouth every 6 (six) hours as needed for moderate pain. 05/13/16   Albertine Patricia, DPM  ibuprofen (ADVIL,MOTRIN) 800 MG tablet Take 800 mg by mouth every 8 (eight) hours as needed for moderate pain.     Historical Provider, MD  latanoprost (XALATAN) 0.005 % ophthalmic solution Place 1 drop into both eyes at bedtime.    Historical Provider, MD  lovastatin (MEVACOR) 40 MG tablet Take 40 mg by mouth every evening.     Historical Provider, MD  meclizine (ANTIVERT) 12.5 MG tablet Take 12.5 mg by mouth 4 (four) times daily as needed for dizziness.  02/21/16   Historical Provider, MD  metFORMIN (GLUCOPHAGE) 1000 MG tablet Take 1,000 mg by mouth 2 (two) times daily with a meal.    Historical Provider, MD  neomycin-bacitracin-polymyxin (NEOSPORIN) ointment Apply 1 application topically daily as needed for wound care. apply to eye    Historical Provider, MD  sulfamethoxazole-trimethoprim (BACTRIM DS,SEPTRA DS) 800-160 MG tablet Take 1 tablet by mouth daily. 7 day course started 05-03-2016 05/03/16   Historical Provider, MD  tamsulosin (FLOMAX) 0.4 MG CAPS capsule Take 0.'4mg'$  every NIGHT 07/02/15   Historical Provider, MD    REVIEW OF SYSTEMS:  Review of Systems  Constitutional: Positive for malaise/fatigue. Negative for chills, fever and weight loss.  HENT: Negative for ear pain, hearing loss and tinnitus.   Eyes: Negative for blurred vision, double vision, pain and redness.  Respiratory: Negative for cough, hemoptysis and shortness of breath.   Cardiovascular: Negative for chest pain, palpitations, orthopnea and leg swelling.   Gastrointestinal: Negative for abdominal pain, constipation, diarrhea, nausea and vomiting.  Genitourinary: Negative for dysuria, frequency and hematuria.  Musculoskeletal: Negative for back pain, joint pain and neck pain.  Skin:       No acne, rash, or lesions  Neurological: Positive for weakness. Negative for dizziness, tremors and focal weakness.       Confusion  Endo/Heme/Allergies: Negative for polydipsia. Does not bruise/bleed easily.  Psychiatric/Behavioral: Negative for depression. The patient is not nervous/anxious and does not have insomnia.      VITAL SIGNS:   Vitals:   05/26/16 0000 05/26/16 0030 05/26/16 0100 05/26/16 0130  BP: (!) 106/55 (!) 109/50 (!) 104/51 (!) 105/51  Pulse: 97 94 96 95  Resp: (!) 27 (!) 28 (!) 28 (!) 27  Temp:      TempSrc:      SpO2: 90% 92%  90% 92%  Weight:      Height:       Wt Readings from Last 3 Encounters:  05/25/16 92.5 kg (204 lb)  05/13/16 93.4 kg (206 lb)  04/01/16 93.4 kg (206 lb)    PHYSICAL EXAMINATION:  Physical Exam  Vitals reviewed. Constitutional: He appears well-developed and well-nourished. No distress.  HENT:  Head: Normocephalic and atraumatic.  Mouth/Throat: Oropharynx is clear and moist.  Eyes: Conjunctivae and EOM are normal. Pupils are equal, round, and reactive to light. No scleral icterus.  Neck: Normal range of motion. Neck supple. No JVD present. No thyromegaly present.  Cardiovascular: Normal rate, regular rhythm and intact distal pulses.  Exam reveals no gallop and no friction rub.   No murmur heard. Respiratory: Effort normal and breath sounds normal. No respiratory distress. He has no wheezes. He has no rales.  GI: Soft. Bowel sounds are normal. He exhibits no distension. There is no tenderness.  Musculoskeletal: Normal range of motion. He exhibits no edema.  No arthritis, no gout  Lymphadenopathy:    He has no cervical adenopathy.  Neurological: He is alert. No cranial nerve deficit.  No  dysarthria, no aphasia  Skin: Skin is warm and dry. No rash noted. No erythema.  Psychiatric:  Unable to assess due to patient condition    LABORATORY PANEL:   CBC  Recent Labs Lab 05/25/16 1640  WBC 4.8  HGB 12.3*  HCT 35.8*  PLT 313   ------------------------------------------------------------------------------------------------------------------  Chemistries   Recent Labs Lab 05/25/16 1640  NA 138  K 3.8  CL 106  CO2 21*  GLUCOSE 157*  BUN 18  CREATININE 1.38*  CALCIUM 9.4  AST 30  ALT 12*  ALKPHOS 72  BILITOT 0.5   ------------------------------------------------------------------------------------------------------------------  Cardiac Enzymes  Recent Labs Lab 05/25/16 1640  TROPONINI <0.03   ------------------------------------------------------------------------------------------------------------------  RADIOLOGY:  Ct Head Wo Contrast  Result Date: 05/25/2016 CLINICAL DATA:  New tremor, left upper extremity weakness EXAM: CT HEAD WITHOUT CONTRAST TECHNIQUE: Contiguous axial images were obtained from the base of the skull through the vertex without intravenous contrast. COMPARISON:  11/19/2015 and CT scan 11/04/2014 FINDINGS: Brain: No intracranial hemorrhage, mass effect or midline shift. Stable moderate cerebral and cerebellar atrophy. Stable periventricular and patchy subcortical chronic white matter disease. No definite acute cortical infarction. No mass lesion is noted on this unenhanced scan. Vascular: Atherosclerotic calcifications of carotid siphon again noted. Skull: No skull fracture is noted. Sinuses/Orbits: There is significant mucosal thickening with almost complete opacification of the right maxillary sinus. Mucosal thickening with partial opacification of bilateral ethmoid air cells. The mastoid air cells are unremarkable. Other: None IMPRESSION: No acute intracranial abnormality. Stable moderate cerebral atrophy. Stable cerebellar atrophy.  Again noted periventricular and patchy subcortical chronic white matter disease. No definite acute cortical infarction. There is mucosal thickening with almost complete opacification of the right maxillary sinus. Mucosal thickening with partial opacification bilateral ethmoid air cells. Electronically Signed   By: Lahoma Crocker M.D.   On: 05/25/2016 17:10   Ct Abdomen Pelvis W Contrast  Result Date: 05/25/2016 CLINICAL DATA:  Abdominal pain and vomiting for 3 days. EXAM: CT ABDOMEN AND PELVIS WITH CONTRAST TECHNIQUE: Multidetector CT imaging of the abdomen and pelvis was performed using the standard protocol following bolus administration of intravenous contrast. CONTRAST:  4m ISOVUE-300 IOPAMIDOL (ISOVUE-300) INJECTION 61% COMPARISON:  None. FINDINGS: Lower Chest: No acute findings. Hepatobiliary:  No masses identified. Gallbladder is unremarkable. Pancreas:  No mass or inflammatory changes. Spleen: Within normal  limits in size and appearance. Adrenals/Urinary Tract: No masses identified. Bilateral renal cysts noted. Several small cysts renal calculi are seen bilaterally. No evidence of hydronephrosis. Unopacified urinary bladder is unremarkable in appearance. Stomach/Bowel: No evidence of obstruction, inflammatory process or abnormal fluid collections. Vascular/Lymphatic: No pathologically enlarged lymph nodes. No abdominal aortic aneurysm. Aortic atherosclerosis. Reproductive:  Mildly enlarged prostate. Other:  None. Musculoskeletal:  No suspicious bone lesions identified. IMPRESSION: No acute findings. Nonobstructive bilateral renal calculi and renal cysts. No evidence hydronephrosis. Mildly enlarged prostate. Aortic atherosclerosis. Electronically Signed   By: Earle Gell M.D.   On: 05/25/2016 18:30   Dg Chest Port 1 View  Result Date: 05/25/2016 CLINICAL DATA:  Shortness of breath EXAM: PORTABLE CHEST 1 VIEW COMPARISON:  11/19/2015 FINDINGS: Interstitial markings are diffusely coarsened with chronic  features. Flame shaped opacity right upper lobe persist. Patient is status post CABG. Bones are diffusely demineralized. Telemetry leads overlie the chest. IMPRESSION: Persistence of focal opacity right upper lobe. Neoplasm a concern. Dedicated CT chest recommended to further evaluate. Electronically Signed   By: Misty Stanley M.D.   On: 05/25/2016 17:17    EKG:   Orders placed or performed during the hospital encounter of 05/25/16  . EKG 12-Lead  . EKG 12-Lead    IMPRESSION AND PLAN:  Principal Problem:   Elevated lactic acid level - patient has had significant vomiting and is dehydrated, but there is also some concern for possible infection. Specifically some concern for possible UTI. Antibiotics were given in the ED and will be kept in place on admission. Urine studies pending Active Problems:   Weakness - suspect due to either profound dehydration, or infection as above, or likely some combination of the 2.   Essential hypertension - hold home antihypertensives for now as the patient's blood pressure is borderline low   Diabetes (HCC) - sliding scale insulin with corresponding glucose checks   PAD (peripheral artery disease) ([place]) - continue home meds  All the records are reviewed and case discussed with ED provider. Management plans discussed with the patient and/or family.  DVT PROPHYLAXIS: SubQ heparin  GI PROPHYLAXIS: None  ADMISSION STATUS: Observation  CODE STATUS: Full Code Status History    Date Active Date Inactive Code Status Order ID Comments User Context   02/03/2016  3:50 PM 02/03/2016  8:42 PM Full Code [id number]  Algernon Huxley, MD Inpatient   11/05/2014 12:59 AM 11/06/2014  4:56 PM Full Code [id number]  Lytle Butte, MD ED      TOTAL TIME TAKING CARE OF THIS PATIENT: 40 minutes.    Bonny Vanleeuwen [place] 05/26/2016, 1:51 AM  [facility]  Office  [phone]  CC: Primary care physician; Madelyn Brunner, MD

## 2016-05-26 NOTE — ED Notes (Signed)
Pt given cup of water, chocolate ice cream, graham crackers and peanut butter.

## 2016-05-26 NOTE — Consult Note (Signed)
Subjective: Patient daughter called and stated that her father's admitted through the emergency room last night. He is a known patient of mine of recently done an amputation revision to his third toe and also application of Apligraf grafts to the left great toe and left fifth metatarsal head. He's been on Bactrim DS to try to fighting type MRSA infection which he culture positive for about a month ago. His wound has been fairly stable but it's slow to heal in the central portion of the wound at this timeframe.  Objective: Exam shows no overt infection but there is exudate from the central portion of the amputation site of the third toe. The Apligraf dressings to the great toe and the fifth metatarsal are intact and stable with intact sutures to these regions. There is no excessive redness or evidence of cellulitis to the foot at this timeframe.  Assessment: Slow healing wounds left foot. Does not appear to be cellulitic or infected but had MRSA infection in the region for a month. His been treated with Bactrim DS which he responded to nicely across region.  Plan: Recommend wet-to-dry saline dressings to the area was lightly wrapping gauze around the forefoot to avoid skin stresses. Change dressing daily with Kerlix gauze wetted with saline as well as a Kerlix wrap. We'll follow him upon release.

## 2016-05-26 NOTE — ED Notes (Signed)
Dr. Willis at bedside  

## 2016-05-26 NOTE — Care Management Obs Status (Signed)
[facility] NOTIFICATION   Patient Details  Name: Matthew Bruce MRN: [id number] Date of Birth: 1927-02-20   Medicare Observation Status Notification Given:  Yes    Shelbie Ammons, RN 05/26/2016, 1:26 PM

## 2016-05-26 NOTE — ED Notes (Signed)
Pt taken to CT via stretcher.

## 2016-05-26 NOTE — Progress Notes (Signed)
Pharmacy Antibiotic Note  Matthew Bruce is a 83 y.o. male admitted on 05/25/2016 with UTI.  Pharmacy has been consulted for ceftriaxone dosing.  Plan: Ceftriaxone 2 grams q 24 hours ordered  Height: (P) 6' (182.9 cm) Weight: (P) 196 lb 1.6 oz (89 kg) IBW/kg (Calculated) : (P) 77.6  Temp (24hrs), Avg:98.8 F (37.1 C), Min:98.1 F (36.7 C), Max:99.4 F (37.4 C)   Recent Labs Lab 05/25/16 1640 05/25/16 2118 05/26/16 0232  WBC 4.8  --   --   CREATININE 1.38*  --   --   LATICACIDVEN 4.4* 3.8* 2.7*    Estimated Creatinine Clearance: 41.4 mL/min (by C-G formula based on SCr of 1.38 mg/dL (H)).    Allergies  Allergen Reactions  . Shellfish Allergy Hives and Rash    Patient denies allergy (03/30/16)    Antimicrobials this admission: azithromycin ceftriaxone 2/22 >>    >>   Dose adjustments this admission:   Microbiology results: 2/22 MRSA PCR: pending    Thank you for allowing pharmacy to be a part of this patient's care.  Tiffancy Moger S 05/26/2016 3:29 AM

## 2016-05-27 DIAGNOSIS — R531 Weakness: Secondary | ICD-10-CM | POA: Diagnosis not present

## 2016-05-27 DIAGNOSIS — R918 Other nonspecific abnormal finding of lung field: Secondary | ICD-10-CM

## 2016-05-27 DIAGNOSIS — I10 Essential (primary) hypertension: Secondary | ICD-10-CM | POA: Diagnosis not present

## 2016-05-27 DIAGNOSIS — L8990 Pressure ulcer of unspecified site, unspecified stage: Secondary | ICD-10-CM | POA: Insufficient documentation

## 2016-05-27 DIAGNOSIS — R7989 Other specified abnormal findings of blood chemistry: Secondary | ICD-10-CM

## 2016-05-27 DIAGNOSIS — E119 Type 2 diabetes mellitus without complications: Secondary | ICD-10-CM | POA: Diagnosis not present

## 2016-05-27 LAB — BASIC METABOLIC PANEL
ANION GAP: 8 (ref 5–15)
BUN: 31 mg/dL — ABNORMAL HIGH (ref 6–20)
CALCIUM: 7.8 mg/dL — AB (ref 8.9–10.3)
CHLORIDE: 105 mmol/L (ref 101–111)
CO2: 21 mmol/L — AB (ref 22–32)
Creatinine, Ser: 1.84 mg/dL — ABNORMAL HIGH (ref 0.61–1.24)
GFR calc Af Amer: 36 mL/min — ABNORMAL LOW (ref >60)
GFR calc non Af Amer: 31 mL/min — ABNORMAL LOW (ref >60)
GLUCOSE: 131 mg/dL — AB (ref 65–99)
Potassium: 4 mmol/L (ref 3.5–5.1)
Sodium: 134 mmol/L — ABNORMAL LOW (ref 135–145)

## 2016-05-27 LAB — GLUCOSE, CAPILLARY
GLUCOSE-CAPILLARY: 118 mg/dL — AB (ref 65–99)
GLUCOSE-CAPILLARY: 206 mg/dL — AB (ref 65–99)
GLUCOSE-CAPILLARY: 186 mg/dL — AB (ref 65–99)
Glucose-Capillary: 185 mg/dL — ABNORMAL HIGH (ref 65–99)

## 2016-05-27 LAB — CBC
HEMATOCRIT: 27.2 % — AB (ref 40.0–52.0)
HEMOGLOBIN: 9.7 g/dL — AB (ref 13.0–18.0)
MCH: 30.4 pg (ref 26.0–34.0)
MCHC: 35.7 g/dL (ref 32.0–36.0)
MCV: 85.1 fL (ref 80.0–100.0)
Platelets: 216 10*3/uL (ref 150–440)
RBC: 3.2 MIL/uL — ABNORMAL LOW (ref 4.40–5.90)
RDW: 15 % — ABNORMAL HIGH (ref 11.5–14.5)
WBC: 7.1 10*3/uL (ref 3.8–10.6)

## 2016-05-27 LAB — HEMOGLOBIN A1C
HEMOGLOBIN A1C: 6.4 % — AB (ref 4.8–5.6)
MEAN PLASMA GLUCOSE: 137

## 2016-05-27 LAB — URINE CULTURE

## 2016-05-27 MED ORDER — SODIUM CHLORIDE 0.9 % IV SOLN: INTRAVENOUS | Status: DC

## 2016-05-27 MED ORDER — TAMSULOSIN HCL 0.4 MG PO CAPS: 0.4000 mg | ORAL_CAPSULE | Freq: Every day | ORAL | Status: AC

## 2016-05-27 NOTE — NC FL2 (Signed)
[place] LEVEL OF CARE SCREENING TOOL     IDENTIFICATION  Patient Name: Matthew Bruce Birthdate: 1927-01-21 Sex: male Admission Date (Current Location): 05/25/2016  [place] and [place] Number:  Engineering geologist and Address:  [facility], [address], [place], [place] [address]      Provider Number: [id number]  Attending Physician Name and Address:  Demetrios Loll, MD  Relative Name and Phone Number:       Current Level of Care: Hospital Recommended Level of Care: [place] Prior Approval Number:    Date Approved/Denied:   PASRR Number: [id number]  Discharge Plan: SNF    Current Diagnoses: Patient Active Problem List   Diagnosis Date Noted  . Pressure injury of skin 05/27/2016  . Elevated lactic acid level 05/26/2016  . Weakness 05/26/2016  . PAD (peripheral artery disease) ([place]) 03/24/2016  . Foot ulceration, left, with necrosis of muscle ([place]) 03/24/2016  . Essential hypertension 02/02/2016  . Diabetes ([place]) 02/02/2016  . Atherosclerosis of native arteries of the extremities with gangrene ([place]) 02/02/2016  . Valvular heart disease 02/02/2016  . Embolic stroke involving right cerebellar artery ([place]) 11/10/2014  . Hypoglycemia 11/05/2014    Orientation RESPIRATION BLADDER Height & Weight     Self, Place  Normal Continent Weight: 206 lb 14.4 oz (93.8 kg) Height:  6' (182.9 cm)  BEHAVIORAL SYMPTOMS/MOOD NEUROLOGICAL BOWEL NUTRITION STATUS      Incontinent Diet (Heart Healthy, Thin Liquids)  AMBULATORY STATUS COMMUNICATION OF NEEDS Skin   Extensive Assist Verbally Normal                       Personal Care Assistance Level of Assistance  Bathing, Feeding, Dressing Bathing Assistance: Limited assistance Feeding assistance: Independent Dressing Assistance: Limited assistance     Functional Limitations Info  Sight, Hearing, Speech Sight Info: Adequate Hearing Info: Adequate Speech Info:  Adequate    SPECIAL CARE FACTORS FREQUENCY  PT (By licensed PT)     PT Frequency: 5              Contractures Contractures Info: Not present    Additional Factors Info  Code Status, Allergies, Insulin Sliding Scale Code Status Info: Full Code Allergies Info: Shellfish Allergy   Insulin Sliding Scale Info: 4x/day       Current Medications (05/27/2016):  This is the current hospital active medication list Current Facility-Administered Medications  Medication Dose Route Frequency Provider Last Rate Last Dose  . 0.9 %  sodium chloride infusion   Intravenous Continuous Demetrios Loll, MD      . acetaminophen (TYLENOL) tablet 650 mg  650 mg Oral Q6H PRN Lance Coon, MD       Or  . acetaminophen (TYLENOL) suppository 650 mg  650 mg Rectal Q6H PRN Lance Coon, MD      . amLODipine (NORVASC) tablet 5 mg  5 mg Oral QHS Lance Coon, MD   5 mg at 05/26/16 2135  . clopidogrel (PLAVIX) tablet 75 mg  75 mg Oral Daily Lance Coon, MD   75 mg at 05/27/16 2841  . dorzolamide (TRUSOPT) 2 % ophthalmic solution 1 drop  1 drop Both Eyes QHS Lance Coon, MD   1 drop at 05/26/16 2137  . finasteride (PROSCAR) tablet 5 mg  5 mg Oral QHS Lance Coon, MD   5 mg at 05/26/16 2135  . heparin injection 5,000 Units  5,000 Units Subcutaneous Q8H Lance Coon, MD   5,000 Units  at 05/27/16 1336  . HYDROcodone-acetaminophen (NORCO/VICODIN) 5-325 MG per tablet 1 tablet  1 tablet Oral Q6H PRN Lance Coon, MD      . insulin aspart (novoLOG) injection 0-5 Units  0-5 Units Subcutaneous QHS Lance Coon, MD      . insulin aspart (novoLOG) injection 0-9 Units  0-9 Units Subcutaneous TID WC Lance Coon, MD   2 Units at 05/27/16 1242  . latanoprost (XALATAN) 0.005 % ophthalmic solution 1 drop  1 drop Both Eyes QHS Lance Coon, MD   1 drop at 05/26/16 2136  . ondansetron (ZOFRAN) tablet 4 mg  4 mg Oral Q6H PRN Lance Coon, MD       Or  . ondansetron [facility]) injection 4 mg  4 mg Intravenous Q6H PRN Lance Coon, MD       . pravastatin (PRAVACHOL) tablet 40 mg  40 mg Oral q1800 Lance Coon, MD   40 mg at 05/26/16 1721  . tamsulosin (FLOMAX) capsule 0.4 mg  0.4 mg Oral Daily Lance Coon, MD   0.4 mg at 05/27/16 3837     Discharge Medications: Please see discharge summary for a list of discharge medications.  Relevant Imaging Results:  Relevant Lab Results:   Additional Information SSN:  [id number]  Darden Dates, LCSW

## 2016-05-27 NOTE — Discharge Instructions (Signed)
Please make sure Matthew Bruce remains well hydrated and make an appointment to see Dr. Melrose Nakayama if the neurologist within 1 week for recheck. Return to the emergency department sooner for any new or worsening symptoms such as fevers, chills, worsening tremors, or for any other concerns.  Ct Head Wo Contrast  Result Date: 05/25/2016 CLINICAL DATA:  New tremor, left upper extremity weakness EXAM: CT HEAD WITHOUT CONTRAST TECHNIQUE: Contiguous axial images were obtained from the base of the skull through the vertex without intravenous contrast. COMPARISON:  11/19/2015 and CT scan 11/04/2014 FINDINGS: Brain: No intracranial hemorrhage, mass effect or midline shift. Stable moderate cerebral and cerebellar atrophy. Stable periventricular and patchy subcortical chronic white matter disease. No definite acute cortical infarction. No mass lesion is noted on this unenhanced scan. Vascular: Atherosclerotic calcifications of carotid siphon again noted. Skull: No skull fracture is noted. Sinuses/Orbits: There is significant mucosal thickening with almost complete opacification of the right maxillary sinus. Mucosal thickening with partial opacification of bilateral ethmoid air cells. The mastoid air cells are unremarkable. Other: None IMPRESSION: No acute intracranial abnormality. Stable moderate cerebral atrophy. Stable cerebellar atrophy. Again noted periventricular and patchy subcortical chronic white matter disease. No definite acute cortical infarction. There is mucosal thickening with almost complete opacification of the right maxillary sinus. Mucosal thickening with partial opacification bilateral ethmoid air cells. Electronically Signed   By: Lahoma Crocker M.D.   On: 05/25/2016 17:10   Ct Abdomen Pelvis W Contrast  Result Date: 05/25/2016 CLINICAL DATA:  Abdominal pain and vomiting for 3 days. EXAM: CT ABDOMEN AND PELVIS WITH CONTRAST TECHNIQUE: Multidetector CT imaging of the abdomen and pelvis was performed using the  standard protocol following bolus administration of intravenous contrast. CONTRAST:  73m ISOVUE-300 IOPAMIDOL (ISOVUE-300) INJECTION 61% COMPARISON:  None. FINDINGS: Lower Chest: No acute findings. Hepatobiliary:  No masses identified. Gallbladder is unremarkable. Pancreas:  No mass or inflammatory changes. Spleen: Within normal limits in size and appearance. Adrenals/Urinary Tract: No masses identified. Bilateral renal cysts noted. Several small cysts renal calculi are seen bilaterally. No evidence of hydronephrosis. Unopacified urinary bladder is unremarkable in appearance. Stomach/Bowel: No evidence of obstruction, inflammatory process or abnormal fluid collections. Vascular/Lymphatic: No pathologically enlarged lymph nodes. No abdominal aortic aneurysm. Aortic atherosclerosis. Reproductive:  Mildly enlarged prostate. Other:  None. Musculoskeletal:  No suspicious bone lesions identified. IMPRESSION: No acute findings. Nonobstructive bilateral renal calculi and renal cysts. No evidence hydronephrosis. Mildly enlarged prostate. Aortic atherosclerosis. Electronically Signed   By: JEarle GellM.D.   On: 05/25/2016 18:30   Dg Chest Port 1 View  Result Date: 05/25/2016 CLINICAL DATA:  Shortness of breath EXAM: PORTABLE CHEST 1 VIEW COMPARISON:  11/19/2015 FINDINGS: Interstitial markings are diffusely coarsened with chronic features. Flame shaped opacity right upper lobe persist. Patient is status post CABG. Bones are diffusely demineralized. Telemetry leads overlie the chest. IMPRESSION: Persistence of focal opacity right upper lobe. Neoplasm a concern. Dedicated CT chest recommended to further evaluate. Electronically Signed   By: EMisty StanleyM.D.   On: 05/25/2016 17:17     Fall and aspiration precaution. Slow healing wounds left foot: Recommend wet-to-dry saline dressings to the area was lightly wrapping gauze around the forefoot to avoid skin stresses. Change dressing daily with Kerlix gauze wetted with  saline as well as a Kerlix wrap. follow up with chest CT chest in 3-6 months

## 2016-05-27 NOTE — Clinical Social Work Placement (Signed)
   CLINICAL SOCIAL WORK PLACEMENT  NOTE  Date:  05/27/2016  Patient Details  Name: Matthew Bruce MRN: [id number] Date of Birth: 10/31/1926  Clinical Social Work is seeking post-discharge placement for this patient at the [place] level of care (*CSW will initial, date and re-position this form in  chart as items are completed):  Yes   Patient/family provided with [facility] Work Department's list of facilities offering this level of care within the geographic area requested by the patient (or if unable, by the patient's family).  Yes   Patient/family informed of their freedom to choose among providers that offer the needed level of care, that participate in Medicare, Medicaid or managed care program needed by the patient, have an available bed and are willing to accept the patient.  Yes   Patient/family informed of [facility]'s ownership interest in [facility] and [facility], as well as of the fact that they are under no obligation to receive care at these facilities.  PASRR submitted to EDS on       PASRR number received on       Existing PASRR number confirmed on 05/27/16     FL2 transmitted to all facilities in geographic area requested by pt/family on 05/27/16     FL2 transmitted to all facilities within larger geographic area on       Patient informed that his/her managed care company has contracts with or will negotiate with certain facilities, including the following:        Yes   Patient/family informed of bed offers received.  Patient chooses bed at [facility]     Physician recommends and patient chooses bed at      Patient to be transferred to   on  .  Patient to be transferred to facility by       Patient family notified on   of transfer.  Name of family member notified:        PHYSICIAN       Additional Comment:    _______________________________________________ Darden Dates, LCSW 05/27/2016,  3:26 PM

## 2016-05-27 NOTE — Consult Note (Signed)
Name: Matthew Bruce MRN: [id number] DOB: Mar 08, 1927    ADMISSION DATE:  05/25/2016 CONSULTATION DATE:  05/27/16  REFERRING MD : Dr. Bridgett Larsson  CHIEF COMPLAINT:  weakness  BRIEF PATIENT DESCRIPTION:  83 y.o. male who presents with Weakness, tremor, confusion. Patient came in to the ED for tremor weakness and confusion, and his initial workup was largely within normal limits except for an elevated lactic acid which was improving after fluid administration. He tried to go home from the ED, but became weaker and more confused before he could get to his car. He came back to the ED for further treatment. Hospitals were called for admission Patient was admitted for hypovolumic shock with dehydration  Patient was noted to have had a RUL opacity in 11/2015 and patient subsequently had CT chest this admission to assess RUL lung opacity Patient is alert and awake, he has no acute resp issues  At this time.   I explained findings to family at bedside and they would like to re-assess patients lungs at a later date/time    PAST MEDICAL HISTORY :   has a past medical history of Anemia; Aortic valve disorder; Coronary artery disease; Diabetes mellitus without complication ([place]); Glaucoma; History of BPH; History of kidney stones; HOH (hard of hearing); Hyperlipidemia; Hypertension; Kidney stones; MRSA (methicillin resistant staph aureus) culture positive (04/01/2016); Myocardial infarction; Neuropathy ([place]); and Peripheral vascular disease ([place]).  has a past surgical history that includes Valve replacement (2007); Cardiac surgery; Cardiac catheterization (Left, 02/03/2016); Coronary artery bypass graft; Cardiac valve replacement; Amputation toe (Left, 02/11/2016); Cardiac catheterization; Irrigation and debridement foot (Left, 04/01/2016); Application if wound vac (Left, 04/01/2016); Cardiac catheterization (Left, 04/01/2016); Ostectomy (Left, 05/13/2016); and Irrigation and debridement foot (Left,  05/13/2016). Prior to Admission medications   Medication Sig Start Date End Date Taking? Authorizing Provider  amLODipine (NORVASC) 5 MG tablet Take 1 tablet (5 mg total) by mouth daily. Patient taking differently: Take 5 mg by mouth at bedtime.  11/06/14  Yes Aldean Jewett, MD  clopidogrel (PLAVIX) 75 MG tablet TAKE 1 TABLET BY MOUTH EVERY DAY 05/24/16  Yes Algernon Huxley, MD  dorzolamide (TRUSOPT) 2 % ophthalmic solution Place 1 drop into both eyes at bedtime.    Yes Historical Provider, MD  finasteride (PROSCAR) 5 MG tablet Take 5 mg by mouth at bedtime.    Yes Historical Provider, MD  glipiZIDE (GLUCOTROL XL) 5 MG 24 hr tablet Take 5 mg by mouth daily. 09/06/15 09/05/16 Yes Historical Provider, MD  ibuprofen (ADVIL,MOTRIN) 800 MG tablet Take 800 mg by mouth every 8 (eight) hours as needed for moderate pain.    Yes Historical Provider, MD  latanoprost (XALATAN) 0.005 % ophthalmic solution Place 1 drop into both eyes at bedtime.   Yes Historical Provider, MD  lovastatin (MEVACOR) 40 MG tablet Take 40 mg by mouth every evening.    Yes Historical Provider, MD  meclizine (ANTIVERT) 12.5 MG tablet Take 12.5 mg by mouth 4 (four) times daily as needed for dizziness.  02/21/16  Yes Historical Provider, MD  metFORMIN (GLUCOPHAGE) 1000 MG tablet Take 1,000 mg by mouth 2 (two) times daily with a meal.   Yes Historical Provider, MD  neomycin-bacitracin-polymyxin (NEOSPORIN) ointment Apply 1 application topically daily as needed for wound care. apply to eye   Yes Historical Provider, MD  sulfamethoxazole-trimethoprim (BACTRIM DS,SEPTRA DS) 800-160 MG tablet Take 1 tablet by mouth 2 (two) times daily. 05/24/16 06/07/16 Yes Historical Provider, MD  HYDROcodone-acetaminophen (NORCO) 5-325 MG tablet Take  1 tablet by mouth every 6 (six) hours as needed for moderate pain. 05/13/16   Albertine Patricia, DPM  tamsulosin (FLOMAX) 0.4 MG CAPS capsule Take 1 capsule (0.4 mg total) by mouth daily. 05/28/16   Demetrios Loll, MD   Allergies   Allergen Reactions  . Shellfish Allergy Hives and Rash    Patient denies allergy (03/30/16)    FAMILY HISTORY:  family history includes Alcohol abuse in his father; Heart attack in his brother and mother; Throat cancer in his sister. SOCIAL HISTORY:  reports that he has quit smoking. His smoking use included Cigarettes. He smoked 1.00 pack per day. His smokeless tobacco use includes Chew. He reports that he does not drink alcohol or use drugs.  REVIEW OF SYSTEMS:   Constitutional: Negative for fever, chills, weight loss, malaise/fatigue and diaphoresis.  HENT: Negative for hearing loss, ear pain, nosebleeds, congestion, sore throat, neck pain, tinnitus and ear discharge.   Eyes: Negative for blurred vision, double vision, photophobia, pain, discharge and redness.  Respiratory: Negative for cough, hemoptysis, sputum production, shortness of breath, wheezing and stridor.   Cardiovascular: Negative for chest pain, palpitations, orthopnea, claudication, leg swelling and PND.  Gastrointestinal: Negative for heartburn, nausea, vomiting, abdominal pain, diarrhea, constipation, blood in stool and melena.  Genitourinary: Negative for dysuria, urgency, frequency, hematuria and flank pain.  Musculoskeletal: Negative for myalgias, back pain, joint pain and falls.  Skin: Negative for itching and rash.  Neurological: Negative for dizziness, tingling, tremors, sensory change, speech change, focal weakness, seizures, loss of consciousness, weakness and headaches.  Endo/Heme/Allergies: Negative for environmental allergies and polydipsia. Does not bruise/bleed easily.    VITAL SIGNS: Temp:  [98.3 F (36.8 C)-98.6 F (37 C)] 98.3 F (36.8 C) (02/23 0453) Pulse Rate:  [95-99] 95 (02/23 0453) Resp:  [20] 20 (02/23 0453) BP: (110-145)/(60-63) 145/63 (02/23 0453) SpO2:  [95 %-99 %] 95 % (02/23 0453) Weight:  [206 lb 14.4 oz (93.8 kg)] 206 lb 14.4 oz (93.8 kg) (02/23 0458)  Physical Examination:    GENERAL:NAD, no fevers, chills, no weakness no fatigue HEAD: Normocephalic, atraumatic.  EYES: Pupils equal, round, reactive to light. Extraocular muscles intact. No scleral icterus.  MOUTH: Moist mucosal membrane. Dentition intact. No abscess noted.  EAR, NOSE, THROAT: Clear without exudates. No external lesions.  NECK: Supple. No thyromegaly. No nodules. No JVD.  PULMONARY: CTA B/L CARDIOVASCULAR: S1 and S2. Regular rate and rhythm. No murmurs, rubs, or gallops. No edema. Pedal pulses 2+ bilaterally.  GASTROINTESTINAL: Soft, nontender, nondistended. No masses. Positive bowel sounds. No hepatosplenomegaly.  MUSCULOSKELETAL: No swelling, clubbing, or edema. Range of motion full in all extremities.  NEUROLOGIC: Cranial nerves II through XII are intact. No gross focal neurological deficits. Sensation intact. Reflexes intact.  SKIN: No ulceration, lesions, rashes, or cyanosis. Skin warm and dry. Turgor intact.  PSYCHIATRIC: Mood, affect within normal limits. The patient is awake, alert and oriented x 3. Insight, judgment intact.  ALL OTHER ROS ARE NEGATIVE       Recent Labs Lab 05/25/16 1640 05/26/16 0442 05/27/16 0521  NA 138 133* 134*  K 3.8 5.0 4.0  CL 106 106 105  CO2 21* 20* 21*  BUN 18 23* 31*  CREATININE 1.38* 1.86* 1.84*  GLUCOSE 157* 201* 131*    Recent Labs Lab 05/25/16 1640 05/26/16 0442 05/27/16 0521  HGB 12.3* 10.3* 9.7*  HCT 35.8* 30.0* 27.2*  WBC 4.8 12.2* 7.1  PLT 313 261 216   Ct Head Wo Contrast  Result Date: 05/25/2016 CLINICAL  DATA:  New tremor, left upper extremity weakness EXAM: CT HEAD WITHOUT CONTRAST TECHNIQUE: Contiguous axial images were obtained from the base of the skull through the vertex without intravenous contrast. COMPARISON:  11/19/2015 and CT scan 11/04/2014 FINDINGS: Brain: No intracranial hemorrhage, mass effect or midline shift. Stable moderate cerebral and cerebellar atrophy. Stable periventricular and patchy subcortical chronic  white matter disease. No definite acute cortical infarction. No mass lesion is noted on this unenhanced scan. Vascular: Atherosclerotic calcifications of carotid siphon again noted. Skull: No skull fracture is noted. Sinuses/Orbits: There is significant mucosal thickening with almost complete opacification of the right maxillary sinus. Mucosal thickening with partial opacification of bilateral ethmoid air cells. The mastoid air cells are unremarkable. Other: None IMPRESSION: No acute intracranial abnormality. Stable moderate cerebral atrophy. Stable cerebellar atrophy. Again noted periventricular and patchy subcortical chronic white matter disease. No definite acute cortical infarction. There is mucosal thickening with almost complete opacification of the right maxillary sinus. Mucosal thickening with partial opacification bilateral ethmoid air cells. Electronically Signed   By: Lahoma Crocker M.D.   On: 05/25/2016 17:10   Ct Chest Wo Contrast  Result Date: 05/26/2016 CLINICAL DATA:  83 year old male with difficulty breathing and vomiting. EXAM: CT CHEST WITHOUT CONTRAST TECHNIQUE: Multidetector CT imaging of the chest was performed following the standard protocol without IV contrast. COMPARISON:  Chest radiograph dated 05/25/2016 and 11/19/2015 FINDINGS: Evaluation of this exam is limited in the absence of intravenous contrast. Cardiovascular: There is mild cardiomegaly. No pericardial effusion. Advanced mild to vessel coronary vascular calcification and CABG procedure. There is advanced atherosclerotic disease of the thoracic aorta. There is mild ectasia of the aortic root measuring up to 4.4 cm. The central pulmonary arteries are unremarkable on this noncontrast study. Mediastinum/Nodes: There is no hilar or mediastinal adenopathy. The esophagus and thyroid gland are grossly unremarkable. No mediastinal fluid collection. Lungs/Pleura: There is a focal area of airspace opacity in the right upper lobe along the  major fissure extending from the right suprahilar region into the right upper pleural surface there are some bronchograms within this consolidative area. Although this may represent focal area of infiltrate, underlying neoplasm is not excluded as this has been persistent since 11/19/2015 radiograph. PET-CT may provide better evaluation in differentiating a neoplastic process versus chronic changes. There bibasilar linear atelectasis/scarring. There is no pleural effusion or pneumothorax. The central airways are patent. Upper Abdomen: Right renal upper staghorn calculus. Ill-defined 2 cm upper pole as well as partially visualized interpolar hypodense lesions are not well characterized but likely represent cysts. There is advanced atherosclerotic calcification of the splenic artery. Musculoskeletal: Osteopenia with degenerative changes of the spine. Median sternotomy wires. No acute fracture. IMPRESSION: 1. Focal airspace opacity in the right upper lobe extending from the right supra hilar region into the pleural surface persistent since radiograph of 11/19/2015. Although this may be related to an infectious/ inflammatory process, neoplasm is a concern, given persistent over time. PET-CT may provide better differentiation between chronic inflammatory/fibrotic process versus neoplasm. 2. No hilar or mediastinal adenopathy. 3. Mild cardiomegaly with advanced multi vessel coronary vascular disease and CABG surgery. 4. Advanced atherosclerotic disease of the aorta. A 4.4 cm ectasia of the ascending aorta Ascending thoracic aortic aneurysm. Recommend semi-annual imaging followup by CTA or MRA and referral to cardiothoracic surgery if not already obtained. This recommendation follows 2010 ACCF/AHA/AATS/ACR/ASA/SCA/SCAI/SIR/STS/SVM Guidelines for the Diagnosis and Management of Patients With Thoracic Aortic Disease. Circulation. 2010; 121: T700-F749 Electronically Signed   By: Laren Everts.D.  On: 05/26/2016 02:55    Ct Abdomen Pelvis W Contrast  Result Date: 05/25/2016 CLINICAL DATA:  Abdominal pain and vomiting for 3 days. EXAM: CT ABDOMEN AND PELVIS WITH CONTRAST TECHNIQUE: Multidetector CT imaging of the abdomen and pelvis was performed using the standard protocol following bolus administration of intravenous contrast. CONTRAST:  90m ISOVUE-300 IOPAMIDOL (ISOVUE-300) INJECTION 61% COMPARISON:  None. FINDINGS: Lower Chest: No acute findings. Hepatobiliary:  No masses identified. Gallbladder is unremarkable. Pancreas:  No mass or inflammatory changes. Spleen: Within normal limits in size and appearance. Adrenals/Urinary Tract: No masses identified. Bilateral renal cysts noted. Several small cysts renal calculi are seen bilaterally. No evidence of hydronephrosis. Unopacified urinary bladder is unremarkable in appearance. Stomach/Bowel: No evidence of obstruction, inflammatory process or abnormal fluid collections. Vascular/Lymphatic: No pathologically enlarged lymph nodes. No abdominal aortic aneurysm. Aortic atherosclerosis. Reproductive:  Mildly enlarged prostate. Other:  None. Musculoskeletal:  No suspicious bone lesions identified. IMPRESSION: No acute findings. Nonobstructive bilateral renal calculi and renal cysts. No evidence hydronephrosis. Mildly enlarged prostate. Aortic atherosclerosis. Electronically Signed   By: JEarle GellM.D.   On: 05/25/2016 18:30   Dg Chest Port 1 View  Result Date: 05/25/2016 CLINICAL DATA:  Shortness of breath EXAM: PORTABLE CHEST 1 VIEW COMPARISON:  11/19/2015 FINDINGS: Interstitial markings are diffusely coarsened with chronic features. Flame shaped opacity right upper lobe persist. Patient is status post CABG. Bones are diffusely demineralized. Telemetry leads overlie the chest. IMPRESSION: Persistence of focal opacity right upper lobe. Neoplasm a concern. Dedicated CT chest recommended to further evaluate. Electronically Signed   By: EMisty StanleyM.D.   On: 05/25/2016 17:17     I have Independently reviewed images of CXR/CT scan on  05/27/2016 Interpretation:there is a persistent RUL opacity extending to pleura   ASSESSMENT / PLAN: 83yo white male with persistent RUL opacity since 11/2015. Findings concerning for primary lung malignancy I have explained to family the options patient has  Option 1 follow up CT chest in 3 months Option 2 be aggressive and attempt ENB Option 3 forget about RUL opacity.  The family has consented and agreed to follow up with CT chest in 3-6 months   Patient/Family are satisfied with Plan of action and management. All questions answered Patient to be discharged home, will follow up in clinic in 3-6 months   Karysa Heft DPatricia Pesa M.D.  LVelora HecklerPulmonary & Critical Care Medicine  Medical Director IHedgesvilleDirector ARegional Rehabilitation HospitalCardio-Pulmonary Department

## 2016-05-27 NOTE — Discharge Summary (Signed)
[facility] at [place] NAME: Matthew Bruce    MR#:  [id number]  DATE OF BIRTH:  15-Oct-1926  DATE OF ADMISSION:  05/25/2016   ADMITTING PHYSICIAN: Lance Coon, MD  DATE OF DISCHARGE: 05/27/2016  PRIMARY CARE PHYSICIAN: Madelyn Brunner, MD   ADMISSION DIAGNOSIS:  Dehydration [E86.0] Coarse tremors [G25.2] Lung nodule [R91.1] DISCHARGE DIAGNOSIS:  Principal Problem:   Elevated lactic acid level Active Problems:   Essential hypertension   Diabetes (HCC)   PAD (peripheral artery disease) (HCC)   Weakness  SECONDARY DIAGNOSIS:   Past Medical History:  Diagnosis Date  . Anemia   . Aortic valve disorder   . Coronary artery disease   . Diabetes mellitus without complication ([place])   . Glaucoma   . History of BPH   . History of kidney stones   . HOH (hard of hearing)    Bilateral Hearing Aids  . Hyperlipidemia   . Hypertension   . Kidney stones   . MRSA (methicillin resistant staph aureus) culture positive 04/01/2016   LEFT FOOT  . Myocardial infarction   . Neuropathy ([place])   . Peripheral vascular disease [facility])    HOSPITAL COURSE:   Lactic acidosis- patient has had significant vomiting and is dehydrated, but there is also some concern for possible infection. However, no UTI. Maybe related to metformin. Improved with IV fluid support. Hold metformin.  Focal airspace opacity in the right upper lobe extending from the right supra hilar region into the pleural surface persistent since radiograph of 11/19/2015. Discontinue abx since no signs of PNA, possible malignancy and need to repeat chest CT in 6 months per Dr. Mortimer Fries, Pulmonary consult.   Leukocytosis. improved.  Weakness - suspect due to either profound dehydration, PT evaluation.  Acute renal failure. Creatinine is stable, He has been treated with IV fluid support and follow-up BMP as outpatient. Discontinued metformin and ibuprofen.  Essential  hypertension - norvasc was hold as the patient's blood pressure is borderline low. BP is elevated, resume.  Diabetes ([place]) - sliding scale insulin with corresponding glucose checks. Hold metformin. Resume glipizide after discharge.  PAD (peripheral artery disease) ([place]) - continue home meds  I discussed with Dr. Mortimer Fries.  DISCHARGE CONDITIONS:  Stable, discharge to SNF today. CONSULTS OBTAINED:  Treatment Team:  Albertine Patricia, DPM DRUG ALLERGIES:   Allergies  Allergen Reactions  . Shellfish Allergy Hives and Rash    Patient denies allergy (03/30/16)   DISCHARGE MEDICATIONS:   Allergies as of 05/27/2016      Reactions   Shellfish Allergy Hives, Rash   Patient denies allergy (03/30/16)      Medication List    STOP taking these medications   HYDROcodone-acetaminophen 5-325 MG tablet Commonly known as:  NORCO   ibuprofen 800 MG tablet Commonly known as:  ADVIL,MOTRIN   metFORMIN 1000 MG tablet Commonly known as:  GLUCOPHAGE   sulfamethoxazole-trimethoprim 800-160 MG tablet Commonly known as:  BACTRIM DS,SEPTRA DS     TAKE these medications   amLODipine 5 MG tablet Commonly known as:  NORVASC Take 1 tablet (5 mg total) by mouth daily. What changed:  when to take this   clopidogrel 75 MG tablet Commonly known as:  PLAVIX TAKE 1 TABLET BY MOUTH EVERY DAY   dorzolamide 2 % ophthalmic solution Commonly known as:  TRUSOPT Place 1 drop into both eyes at bedtime.   finasteride 5 MG tablet Commonly known as:  PROSCAR Take 5 mg by  mouth at bedtime.   glipiZIDE 5 MG 24 hr tablet Commonly known as:  GLUCOTROL XL Take 5 mg by mouth daily.   latanoprost 0.005 % ophthalmic solution Commonly known as:  XALATAN Place 1 drop into both eyes at bedtime.   lovastatin 40 MG tablet Commonly known as:  MEVACOR Take 40 mg by mouth every evening.   meclizine 12.5 MG tablet Commonly known as:  ANTIVERT Take 12.5 mg by mouth 4 (four) times daily as needed for  dizziness.   neomycin-bacitracin-polymyxin ointment Commonly known as:  NEOSPORIN Apply 1 application topically daily as needed for wound care. apply to eye   tamsulosin 0.4 MG Caps capsule Commonly known as:  FLOMAX Take 1 capsule (0.4 mg total) by mouth daily. Start taking on:  05/28/2016 What changed:  See the new instructions.        DISCHARGE INSTRUCTIONS:  See AVS.  If you experience worsening of your admission symptoms, develop shortness of breath, life threatening emergency, suicidal or homicidal thoughts you must seek medical attention immediately by calling 911 or calling your MD immediately  if symptoms less severe.  You Must read complete instructions/literature along with all the possible adverse reactions/side effects for all the Medicines you take and that have been prescribed to you. Take any new Medicines after you have completely understood and accpet all the possible adverse reactions/side effects.   Please note  You were cared for by a hospitalist during your hospital stay. If you have any questions about your discharge medications or the care you received while you were in the hospital after you are discharged, you can call the unit and asked to speak with the hospitalist on call if the hospitalist that took care of you is not available. Once you are discharged, your primary care physician will handle any further medical issues. Please note that NO REFILLS for any discharge medications will be authorized once you are discharged, as it is imperative that you return to your primary care physician (or establish a relationship with a primary care physician if you do not have one) for your aftercare needs so that they can reassess your need for medications and monitor your lab values.    On the day of Discharge:  VITAL SIGNS:  Blood pressure (!) 145/63, pulse 95, temperature 98.3 F (36.8 C), resp. rate 20, height 6' (1.829 m), weight 206 lb 14.4 oz (93.8 kg), SpO2 95  %. PHYSICAL EXAMINATION:  GENERAL:  83 y.o.-year-old patient lying in the bed with no acute distress.  EYES: Pupils equal, round, reactive to light and accommodation. No scleral icterus. Extraocular muscles intact.  HEENT: Head atraumatic, normocephalic. Oropharynx and nasopharynx clear.  NECK:  Supple, no jugular venous distention. No thyroid enlargement, no tenderness.  LUNGS: Normal breath sounds bilaterally, no wheezing, rales,rhonchi or crepitation. No use of accessory muscles of respiration.  CARDIOVASCULAR: S1, S2 normal. No murmurs, rubs, or gallops.  ABDOMEN: Soft, non-tender, non-distended. Bowel sounds present. No organomegaly or mass.  EXTREMITIES: No pedal edema, cyanosis, or clubbing. Left foot in dressing. NEUROLOGIC: Cranial nerves II through XII are intact. Muscle strength 5/5 in all extremities. Sensation intact. Gait not checked.  PSYCHIATRIC: The patient is alert and oriented x 3.  SKIN: No obvious rash, lesion, or ulcer.  DATA REVIEW:   CBC  Recent Labs Lab 05/27/16 0521  WBC 7.1  HGB 9.7*  HCT 27.2*  PLT 216    Chemistries   Recent Labs Lab 05/25/16 1640  05/27/16 0521  NA  138  < > 134*  K 3.8  < > 4.0  CL 106  < > 105  CO2 21*  < > 21*  GLUCOSE 157*  < > 131*  BUN 18  < > 31*  CREATININE 1.38*  < > 1.84*  CALCIUM 9.4  < > 7.8*  AST 30  --   --   ALT 12*  --   --   ALKPHOS 72  --   --   BILITOT 0.5  --   --   < > = values in this interval not displayed.   Microbiology Results  Results for orders placed or performed during the hospital encounter of 05/13/16  Surgical pcr screen     Status: None   Collection Time: 05/13/16  6:29 AM  Result Value Ref Range Status   MRSA, PCR NEGATIVE NEGATIVE Final   Staphylococcus aureus NEGATIVE NEGATIVE Final    Comment:        The Xpert SA Assay (FDA approved for NASAL specimens in patients over 45 years of age), is one component of a comprehensive surveillance program.  Test performance has been  validated by [facility] for patients greater than or equal to 31 year old. It is not intended to diagnose infection nor to guide or monitor treatment.   Aerobic/Anaerobic Culture (surgical/deep wound)     Status: None   Collection Time: 05/13/16  8:03 AM  Result Value Ref Range Status   Specimen Description BONE third metatarsae left foot  Final   Special Requests NONE  Final   Gram Stain   Final    RARE WBC PRESENT, PREDOMINANTLY PMN NO ORGANISMS SEEN    Culture   Final    ABUNDANT DIPHTHEROIDS(CORYNEBACTERIUM SPECIES) Standardized susceptibility testing for this organism is not available. NO ANAEROBES ISOLATED Performed at [facility], [place] [address] [place], [place] [address]    Report Status 05/18/2016 FINAL  Final    RADIOLOGY:  No results found.   Management plans discussed with the patient, his daughter and wife and they are in agreement.  CODE STATUS: Full Code   TOTAL TIME TAKING CARE OF THIS PATIENT: 36 minutes.    Demetrios Loll M.D on 05/27/2016 at 1:27 PM  Between 7am to 6pm - Pager - [phone]  After 6pm go to [email or web address] - Proofreader  [facility]  Office  [phone]  CC: Primary care physician; Madelyn Brunner, MD   Note: This dictation was prepared with Dragon dictation along with smaller phrase technology. Any transcriptional errors that result from this process are unintentional.

## 2016-05-27 NOTE — Clinical Social Work Note (Signed)
Clinical Social Work Assessment  Patient Details  Name: Matthew Bruce MRN: [id number] Date of Birth: 12/28/1926  Date of referral:  05/27/16               Reason for consult:  Facility Placement, Discharge Planning                Permission sought to share information with:  Family Supports Permission granted to share information::  Yes, Verbal Permission Granted  Name::     [name]   Relationship::  daughter  Contact Information:  [phone]  Housing/Transportation Living arrangements for the past 2 months:  Single Family Home Source of Information:  Adult Children Patient Interpreter Needed:  None Criminal Activity/Legal Involvement Pertinent to Current Situation/Hospitalization:  No - Comment as needed Significant Relationships:  Adult Children, Spouse Lives with:  Significant Other Do you feel safe going back to the place where you live?  No Need for family participation in patient care:  Yes (Comment)  Care giving concerns:  No care giving concerns identified.   Social Worker assessment / plan:  CSW met with pt and family to address consult for New SNF. CSW introduced herself and explained role of social work. CSW also explained the process of discharging to SNF with [employer]. CSW initiated a bed search and followed up with bed offers. Pt's family chose [facility]. CSW contacted the facility and [facility] was initiated. MD is aware insurance is barrier. Family is aware as well. CSW will continue to follow.   Employment status:  Retired Nurse, adult PT Recommendations:  [place] / Referral to community resources:  [place]  Patient/Family's Response to care:  Pt and family were appreciative of CSW support.   Patient/Family's Understanding of and Emotional Response to Diagnosis, Current Treatment, and Prognosis:  Pt undertands that he would benefit from STR at [facility].   Emotional  Assessment Appearance:  Appears stated age Attitude/Demeanor/Rapport:   (Appropriate) Affect (typically observed):  Accepting, Adaptable, Pleasant Orientation:  Oriented to Self, Oriented to Place Alcohol / Substance use:  Not Applicable Psych involvement (Current and /or in the community):  No (Comment)  Discharge Needs  Concerns to be addressed:  No discharge needs identified Readmission within the last 30 days:  No Current discharge risk:  Chronically ill Barriers to Discharge:  Continued Medical Work up   [employer], LCSW 05/27/2016, 4:53 PM

## 2016-05-28 DIAGNOSIS — R7989 Other specified abnormal findings of blood chemistry: Secondary | ICD-10-CM | POA: Diagnosis not present

## 2016-05-28 DIAGNOSIS — I10 Essential (primary) hypertension: Secondary | ICD-10-CM | POA: Diagnosis not present

## 2016-05-28 DIAGNOSIS — E119 Type 2 diabetes mellitus without complications: Secondary | ICD-10-CM | POA: Diagnosis not present

## 2016-05-28 DIAGNOSIS — I739 Peripheral vascular disease, unspecified: Secondary | ICD-10-CM | POA: Diagnosis not present

## 2016-05-28 LAB — GLUCOSE, CAPILLARY
GLUCOSE-CAPILLARY: 171 mg/dL — AB (ref 65–99)
GLUCOSE-CAPILLARY: 225 mg/dL — AB (ref 65–99)
Glucose-Capillary: 203 mg/dL — ABNORMAL HIGH (ref 65–99)
Glucose-Capillary: 268 mg/dL — ABNORMAL HIGH (ref 65–99)

## 2016-05-28 NOTE — Plan of Care (Signed)
Problem: Safety: Goal: Ability to remain free from injury will improve Outcome: Progressing Pt oob to chair with rolling walker; stand by assist this shift.  Problem: Skin Integrity: Goal: Risk for impaired skin integrity will decrease Outcome: Progressing Sacral area red and blanchable; pink foam applied

## 2016-05-28 NOTE — Progress Notes (Signed)
Pt planning for discharge today to [facility] after approval from insurance company-pain med given x1 for shoulder pain with relief.

## 2016-05-28 NOTE — Clinical Social Work Note (Signed)
CSW received notification from Laurann Montana from [facility] that the facility has not received word from [facility] as to [place] for services and will not receive auth over the weekend due to [facility] not providing such services outside of regular business hours. The bed is available; however, they cannot receive the patient without the insurance auth. No other facilities have initiated auth. CSW has paged the attending to update. CSW will con't to follow.   Santiago Bumpers, MSW, Latanya Presser [phone]

## 2016-05-28 NOTE — Progress Notes (Signed)
[facility] at [place] NAME: Matthew Bruce    MR#:  [id number]  DATE OF BIRTH:  24-Feb-1927  SUBJECTIVE:  CHIEF COMPLAINT:   Chief Complaint  Patient presents with  . Emesis   The patient has no complaints. REVIEW OF SYSTEMS:  Review of Systems  Constitutional: Negative.   HENT: Negative.   Eyes: Negative.   Respiratory: Negative.   Cardiovascular: Negative.   Gastrointestinal: Negative.   Genitourinary: Negative.   Musculoskeletal: Negative.   Skin: Negative.   Neurological: Negative.   Psychiatric/Behavioral: Negative.     DRUG ALLERGIES:   Allergies  Allergen Reactions  . Shellfish Allergy Hives and Rash    Patient denies allergy (03/30/16)   VITALS:  Blood pressure 133/65, pulse 91, temperature 98 F (36.7 C), temperature source Oral, resp. rate 19, height 6' (1.829 m), weight 190 lb 4.8 oz (86.3 kg), SpO2 96 %. PHYSICAL EXAMINATION:  Physical Exam  Constitutional: He is oriented to person, place, and time and well-developed, well-nourished, and in no distress.  HENT:  Head: Normocephalic.  Eyes: Conjunctivae and EOM are normal.  Neck: Normal range of motion. Neck supple. No JVD present. No tracheal deviation present.  Cardiovascular: Normal rate, regular rhythm and normal heart sounds.  Exam reveals no gallop.   No murmur heard. Pulmonary/Chest: Effort normal. No respiratory distress. He has no wheezes. He has no rales.  Abdominal: Soft. Bowel sounds are normal. He exhibits no distension. There is no tenderness.  Musculoskeletal: Normal range of motion. He exhibits no edema or tenderness.  Neurological: He is alert and oriented to person, place, and time. No cranial nerve deficit.  Skin: No rash noted. No erythema.  Psychiatric: Affect normal.   LABORATORY PANEL:  Male CBC  Recent Labs Lab 05/27/16 0521  WBC 7.1  HGB 9.7*  HCT 27.2*  PLT 216    ------------------------------------------------------------------------------------------------------------------ Chemistries   Recent Labs Lab 05/25/16 1640  05/27/16 0521  NA 138  < > 134*  K 3.8  < > 4.0  CL 106  < > 105  CO2 21*  < > 21*  GLUCOSE 157*  < > 131*  BUN 18  < > 31*  CREATININE 1.38*  < > 1.84*  CALCIUM 9.4  < > 7.8*  AST 30  --   --   ALT 12*  --   --   ALKPHOS 72  --   --   BILITOT 0.5  --   --   < > = values in this interval not displayed. RADIOLOGY:  No results found. ASSESSMENT AND PLAN:   Lactic acidosis- patient has had significant vomiting and is dehydrated, but there is also some concern for possible infection. However, no UTI. Maybe related to metformin. Improved with IV fluid support. Hold metformin.  Focal airspace opacity in the right upper lobe extending from the right supra hilar region into the pleural surface persistent since radiograph of 11/19/2015. Discontinue abx since no signs of PNA, possible malignancy and need to repeat chest CT in 6 months per Dr. Myrtice Lauth consult.   Leukocytosis. improved.  Weakness - suspect due to either profound dehydration, PT evaluation: SNF.  Acute renal failure. Creatinine is stable, He has been treated with IV fluid support and follow-up BMP. Discontinued metformin and ibuprofen.  Essential hypertension - norvasc was hold as the patient's blood pressure is borderline low. BP is elevated, resume.  Diabetes ([place]) - sliding scale insulin with corresponding glucose checks. Hold metformin. Resume  glipizide after discharge.  PAD (peripheral artery disease) ([place]) - continue home meds  The patient could not be discharged to skilled nursing facility due to insurance authorization. All the records are reviewed and case discussed with Care Management/Social Worker. Management plans discussed with the patient, his daughter and son,  and they are in agreement.  CODE STATUS: Full  Code  TOTAL TIME TAKING CARE OF THIS PATIENT: 33 minutes.   More than 50% of the time was spent in counseling/coordination of care: YES  POSSIBLE D/C IN 2 DAYS, DEPENDING ON CLINICAL CONDITION.   Demetrios Loll M.D on 05/28/2016 at 2:49 PM  Between 7am to 6pm - Pager - [phone]  After 6pm go to [email or web address] - Proofreader  [facility] [place] [facility]  Office  [phone]  CC: Primary care physician; Madelyn Brunner, MD  Note: This dictation was prepared with Dragon dictation along with smaller phrase technology. Any transcriptional errors that result from this process are unintentional.

## 2016-05-29 DIAGNOSIS — R531 Weakness: Secondary | ICD-10-CM | POA: Diagnosis not present

## 2016-05-29 DIAGNOSIS — R7989 Other specified abnormal findings of blood chemistry: Secondary | ICD-10-CM | POA: Diagnosis not present

## 2016-05-29 DIAGNOSIS — I10 Essential (primary) hypertension: Secondary | ICD-10-CM | POA: Diagnosis not present

## 2016-05-29 DIAGNOSIS — I739 Peripheral vascular disease, unspecified: Secondary | ICD-10-CM | POA: Diagnosis not present

## 2016-05-29 LAB — GLUCOSE, CAPILLARY
GLUCOSE-CAPILLARY: 222 mg/dL — AB (ref 65–99)
Glucose-Capillary: 264 mg/dL — ABNORMAL HIGH (ref 65–99)
Glucose-Capillary: 231 mg/dL — ABNORMAL HIGH (ref 65–99)
Glucose-Capillary: 246 mg/dL — ABNORMAL HIGH (ref 65–99)

## 2016-05-29 LAB — BASIC METABOLIC PANEL
Anion gap: 6 (ref 5–15)
BUN: 19 mg/dL (ref 6–20)
CHLORIDE: 107 mmol/L (ref 101–111)
CO2: 24 mmol/L (ref 22–32)
CREATININE: 1.21 mg/dL (ref 0.61–1.24)
Calcium: 9.1 mg/dL (ref 8.9–10.3)
GFR calc non Af Amer: 52 mL/min — ABNORMAL LOW (ref >60)
Glucose, Bld: 229 mg/dL — ABNORMAL HIGH (ref 65–99)
POTASSIUM: 4 mmol/L (ref 3.5–5.1)
Sodium: 137 mmol/L (ref 135–145)

## 2016-05-29 MED ORDER — INSULIN GLARGINE 100 UNIT/ML ~~LOC~~ SOLN: 8.0000 [IU] | Freq: Every day | SUBCUTANEOUS | Status: DC

## 2016-05-29 MED ORDER — INSULIN GLARGINE 100 UNIT/ML ~~LOC~~ SOLN
8.0000 [IU] | Freq: Every day | SUBCUTANEOUS | Status: DC
Start: 2016-05-29 — End: 2016-05-30
  Administered 2016-05-29: 21:00:00 8 [IU] via SUBCUTANEOUS
  Filled 2016-05-29 (×2): qty 0.08

## 2016-05-29 NOTE — Progress Notes (Addendum)
[place] at [place] NAME: Matthew Bruce    MR#:  [id number]  DATE OF BIRTH:  [birth year]  SUBJECTIVE:  CHIEF COMPLAINT:   Chief Complaint  Patient presents with  . Emesis   The patient has no complaints. REVIEW OF SYSTEMS:  Review of Systems  Constitutional: Negative.   HENT: Negative.   Eyes: Negative.   Respiratory: Negative.   Cardiovascular: Negative.   Gastrointestinal: Negative.   Genitourinary: Negative.   Musculoskeletal: Negative.   Skin: Negative.   Neurological: Negative.   Psychiatric/Behavioral: Negative.     DRUG ALLERGIES:   Allergies  Allergen Reactions  . Shellfish Allergy Hives and Rash    Patient denies allergy (03/30/16)   VITALS:  Blood pressure (!) 142/71, pulse 83, temperature 97.6 F (36.4 C), temperature source Oral, resp. rate 19, height 6' (1.829 m), weight 190 lb 6.4 oz (86.4 kg), SpO2 95 %. PHYSICAL EXAMINATION:  Physical Exam  Constitutional: He is oriented to person, place, and time and well-developed, well-nourished, and in no distress.  HENT:  Head: Normocephalic.  Eyes: Conjunctivae and EOM are normal.  Neck: Normal range of motion. Neck supple. No JVD present. No tracheal deviation present.  Cardiovascular: Normal rate, regular rhythm and normal heart sounds.  Exam reveals no gallop.   No murmur heard. Pulmonary/Chest: Effort normal. No respiratory distress. He has no wheezes. He has no rales.  Abdominal: Soft. Bowel sounds are normal. He exhibits no distension. There is no tenderness.  Musculoskeletal: Normal range of motion. He exhibits no edema or tenderness.  Left foot in dressing. 0.5 cm black skin on right big toe, looks chronic.  Neurological: He is alert and oriented to person, place, and time. No cranial nerve deficit.  Skin: No rash noted. No erythema.  Psychiatric: Affect normal.   LABORATORY PANEL:  Male CBC  Recent Labs Lab 05/27/16 0521  WBC 7.1  HGB 9.7*    HCT 27.2*  PLT 216   ------------------------------------------------------------------------------------------------------------------ Chemistries   Recent Labs Lab 05/25/16 1640  05/29/16 0458  NA 138  < > 137  K 3.8  < > 4.0  CL 106  < > 107  CO2 21*  < > 24  GLUCOSE 157*  < > 229*  BUN 18  < > 19  CREATININE 1.38*  < > 1.21  CALCIUM 9.4  < > 9.1  AST 30  --   --   ALT 12*  --   --   ALKPHOS 72  --   --   BILITOT 0.5  --   --   < > = values in this interval not displayed. RADIOLOGY:  No results found. ASSESSMENT AND PLAN:   Lactic acidosis- patient has had significant vomiting and is dehydrated, but there is also some concern for possible infection. However, no UTI. Maybe related to metformin. Improved with IV fluid support. Hold metformin.  Focal airspace opacity in the right upper lobe extending from the right supra hilar region into the pleural surface persistent since radiograph of 11/19/2015. Discontinued abx since no signs of PNA, possible malignancy and need to repeat chest CT in 6 months per Dr. Myrtice Lauth consult.   Leukocytosis. improved.  Weakness - suspect due to either profound dehydration, PT evaluation: SNF.  Acute renal failure. Improved with IV fluid support. Discontinued metformin and ibuprofen.  Essential hypertension - norvasc was hold as the patient's blood pressure is borderline low. BP is elevated, resumed.  Diabetes ([place]) - sliding scale  insulin with corresponding glucose checks. Hold metformin. Resume glipizide after discharge. Add lantus due to elevated BS.  PAD (peripheral artery disease) ([place]) - continue home meds  Slow healing wounds left foot.  Dr. Elvina Mattes recommend wet-to-dry saline dressings to the area was lightly wrapping gauze around the forefoot to avoid skin stresses. Change dressing daily with Kerlix gauze wetted with saline as well as a Kerlix wrap.   Right big toe necrotic skin. F/u Dr.  Elvina Mattes.  The patient could not be discharged to skilled nursing facility due to insurance authorization. All the records are reviewed and case discussed with Care Management/Social Worker. Management plans discussed with the patient, his daughter and son,  and they are in agreement.  CODE STATUS: Full Code  TOTAL TIME TAKING CARE OF THIS PATIENT: 25 minutes.   More than 50% of the time was spent in counseling/coordination of care: YES  POSSIBLE D/C IN 1 DAYS, DEPENDING ON CLINICAL CONDITION.   Demetrios Loll M.D on 05/29/2016 at 11:12 AM  Between 7am to 6pm - Pager - [phone]  After 6pm go to [email or web address] - Proofreader  [facility]  Office  [phone]  CC: Primary care physician; Madelyn Brunner, MD  Note: This dictation was prepared with Dragon dictation along with smaller phrase technology. Any transcriptional errors that result from this process are unintentional.

## 2016-05-30 DIAGNOSIS — R531 Weakness: Secondary | ICD-10-CM | POA: Diagnosis not present

## 2016-05-30 DIAGNOSIS — Z7401 Bed confinement status: Secondary | ICD-10-CM | POA: Diagnosis not present

## 2016-05-30 DIAGNOSIS — R410 Disorientation, unspecified: Secondary | ICD-10-CM | POA: Diagnosis not present

## 2016-05-30 DIAGNOSIS — N179 Acute kidney failure, unspecified: Secondary | ICD-10-CM | POA: Diagnosis not present

## 2016-05-30 DIAGNOSIS — K5900 Constipation, unspecified: Secondary | ICD-10-CM | POA: Diagnosis not present

## 2016-05-30 DIAGNOSIS — H409 Unspecified glaucoma: Secondary | ICD-10-CM | POA: Diagnosis not present

## 2016-05-30 DIAGNOSIS — Z79899 Other long term (current) drug therapy: Secondary | ICD-10-CM | POA: Diagnosis not present

## 2016-05-30 DIAGNOSIS — G252 Other specified forms of tremor: Secondary | ICD-10-CM | POA: Diagnosis not present

## 2016-05-30 DIAGNOSIS — E872 Acidosis: Secondary | ICD-10-CM | POA: Diagnosis not present

## 2016-05-30 DIAGNOSIS — N400 Enlarged prostate without lower urinary tract symptoms: Secondary | ICD-10-CM | POA: Diagnosis not present

## 2016-05-30 DIAGNOSIS — I2510 Atherosclerotic heart disease of native coronary artery without angina pectoris: Secondary | ICD-10-CM | POA: Diagnosis not present

## 2016-05-30 DIAGNOSIS — H40013 Open angle with borderline findings, low risk, bilateral: Secondary | ICD-10-CM | POA: Diagnosis not present

## 2016-05-30 DIAGNOSIS — R7989 Other specified abnormal findings of blood chemistry: Secondary | ICD-10-CM | POA: Diagnosis not present

## 2016-05-30 DIAGNOSIS — I739 Peripheral vascular disease, unspecified: Secondary | ICD-10-CM | POA: Diagnosis not present

## 2016-05-30 DIAGNOSIS — Z5189 Encounter for other specified aftercare: Secondary | ICD-10-CM | POA: Diagnosis not present

## 2016-05-30 DIAGNOSIS — N401 Enlarged prostate with lower urinary tract symptoms: Secondary | ICD-10-CM | POA: Diagnosis not present

## 2016-05-30 DIAGNOSIS — I10 Essential (primary) hypertension: Secondary | ICD-10-CM | POA: Diagnosis not present

## 2016-05-30 DIAGNOSIS — E785 Hyperlipidemia, unspecified: Secondary | ICD-10-CM | POA: Diagnosis not present

## 2016-05-30 DIAGNOSIS — E1140 Type 2 diabetes mellitus with diabetic neuropathy, unspecified: Secondary | ICD-10-CM | POA: Diagnosis not present

## 2016-05-30 DIAGNOSIS — R918 Other nonspecific abnormal finding of lung field: Secondary | ICD-10-CM | POA: Diagnosis not present

## 2016-05-30 DIAGNOSIS — E860 Dehydration: Secondary | ICD-10-CM | POA: Diagnosis not present

## 2016-05-30 DIAGNOSIS — M6281 Muscle weakness (generalized): Secondary | ICD-10-CM | POA: Diagnosis not present

## 2016-05-30 DIAGNOSIS — R0602 Shortness of breath: Secondary | ICD-10-CM | POA: Diagnosis not present

## 2016-05-30 DIAGNOSIS — E119 Type 2 diabetes mellitus without complications: Secondary | ICD-10-CM | POA: Diagnosis not present

## 2016-05-30 DIAGNOSIS — M2550 Pain in unspecified joint: Secondary | ICD-10-CM | POA: Diagnosis not present

## 2016-05-30 LAB — GLUCOSE, CAPILLARY
GLUCOSE-CAPILLARY: 226 mg/dL — AB (ref 65–99)
Glucose-Capillary: 233 mg/dL — ABNORMAL HIGH (ref 65–99)

## 2016-05-30 MED ORDER — AMLODIPINE BESYLATE 10 MG PO TABS: 10.0000 mg | ORAL_TABLET | Freq: Every day | ORAL | Status: DC

## 2016-05-30 MED ORDER — INSULIN ASPART 100 UNIT/ML ~~LOC~~ SOLN
5.0000 [IU] | Freq: Three times a day (TID) | SUBCUTANEOUS | Status: DC
  Administered 2016-05-30 (×2): 5 [IU] via SUBCUTANEOUS
  Filled 2016-05-30 (×2): qty 5

## 2016-05-30 MED ORDER — SENNOSIDES-DOCUSATE SODIUM 8.6-50 MG PO TABS
1.0000 | ORAL_TABLET | Freq: Every evening | ORAL | Status: DC | PRN
  Administered 2016-05-30: 11:00:00 1 via ORAL
  Filled 2016-05-30: qty 1

## 2016-05-30 MED ORDER — LACTULOSE 10 GM/15ML PO SOLN
20.0000 g | Freq: Two times a day (BID) | ORAL | Status: DC | PRN
  Administered 2016-05-30: 11:00:00 20 g via ORAL
  Filled 2016-05-30: qty 30

## 2016-05-30 MED ORDER — SENNOSIDES-DOCUSATE SODIUM 8.6-50 MG PO TABS: 1.0000 | ORAL_TABLET | Freq: Every evening | ORAL | Status: DC | PRN

## 2016-05-30 NOTE — Progress Notes (Signed)
Physical Therapy Treatment Patient Details Name: Matthew Bruce MRN: [id number] DOB: 1926-09-25 Today's Date: 05/30/2016    History of Present Illness Pt is an 83 y.o. male presenting to hospital with new onset tremors B UE's>LE's, weakness, and confusion (pt discharged home from ED but unable to get into car and brought back to ED).  Pt admitted with elevated lactic acid level.  PMH includes s/p amputation L 3rd toe (s/p ostectomy L foot and I&D L foot 05/13/16), CAD, DM, neuropathy, MI, htn, HOH.    PT Comments    Pt demonstrating improvement with sitting balance but still requiring assist for functional mobility.  Pt able to progress to ambulating 70 feet with RW but pt suddenly feeling weak and unable to walk any further (unable to make it back to room) and required a chair to be brought quickly to sit down on.  Pt required to be brought back to room in rolling recliner d/t weakness and unable to walk back to room.  Pt demonstrating impaired activity tolerance and overall weakness with activity.  Recommend pt discharge to STR when medically appropriate.    Follow Up Recommendations  SNF     Equipment Recommendations  Rolling walker with 5" wheels    Recommendations for Other Services       Precautions / Restrictions Precautions Precautions: Fall Restrictions Weight Bearing Restrictions: Yes Other Position/Activity Restrictions: Per pt's family: pt to always wear post op shoe on L foot when up (and pt does not tend to WB on front of L foot d/t pain)    Mobility  Bed Mobility Overal bed mobility: Needs Assistance Bed Mobility: Supine to Sit     Supine to sit: Mod assist;HOB elevated     General bed mobility comments: assist for trunk supine to sit  Transfers Overall transfer level: Needs assistance Equipment used: Rolling walker (2 wheeled) Transfers: Sit to/from [facility] Sit to Stand: Min assist Stand pivot transfers: Min assist (stand pivot  chair with arms to recliner chair in hallway)       General transfer comment: pt requiring multiple attempts to stand from bed using RW and unable to stand with assist until given vc's for changing hand and feet positioning; min assist to initiate stand from bedside chair  Ambulation/Gait Ambulation/Gait assistance: Min guard;Min assist Ambulation Distance (Feet): 70 Feet Assistive device: Rolling walker (2 wheeled) Gait Pattern/deviations: Step-to pattern Gait velocity: decreased   General Gait Details: decreased stance time L LE; B knees flexing with fatigue; pt unable to return to room and requesting to sit d/t weakness (chair required to be brought to pt in hallway for pt to sit down)   Stairs            Wheelchair Mobility    Modified Rankin (Stroke Patients Only)       Balance Overall balance assessment: Needs assistance Sitting-balance support: Bilateral upper extremity supported;Feet supported Sitting balance-Leahy Scale: Fair Sitting balance - Comments: static sitting   Standing balance support: Bilateral upper extremity supported Standing balance-Leahy Scale: Fair Standing balance comment: static standing                    Cognition Arousal/Alertness: Awake/alert Behavior During Therapy: WFL for tasks assessed/performed Overall Cognitive Status: Within Functional Limits for tasks assessed                      Exercises      General Comments General comments (skin integrity, edema, etc.):  Pt very HOH (even with hearing aides in) and requiring therapist to write information on paper in order to communicate with pt during session.  Nursing cleared pt for participation in physical therapy.  Pt agreeable to PT session.      Pertinent Vitals/Pain Pain Assessment: No/denies pain  Vitals (HR and O2 on room air) stable and WFL throughout treatment session.    Home Living                      Prior Function            PT Goals  (current goals can now be found in the care plan section) Acute Rehab PT Goals Patient Stated Goal: to get stronger PT Goal Formulation: With patient Time For Goal Achievement: 06/09/16 Potential to Achieve Goals: Good Progress towards PT goals: Progressing toward goals    Frequency    Min 2X/week      PT Plan Current plan remains appropriate    Co-evaluation             End of Session Equipment Utilized During Treatment: Gait belt Activity Tolerance: Patient limited by fatigue Patient left: in chair;with call bell/phone within reach;with chair alarm set Nurse Communication: Mobility status;Precautions PT Visit Diagnosis: Difficulty in walking, not elsewhere classified (R26.2);Muscle weakness (generalized) (M62.81);History of falling (Z91.81)     Time: 5188-4166 PT Time Calculation (min) (ACUTE ONLY): 31 min  Charges:  $Gait Training: 8-22 mins $Therapeutic Activity: 8-22 mins                    G CodesLeitha Bleak, PT 2016/06/24, 11:07 AM [phone]

## 2016-05-30 NOTE — Evaluation (Signed)
Occupational Therapy Evaluation Patient Details Name: Matthew Bruce MRN: [id number] DOB: November 13, 1926 Today's Date: 05/30/2016    History of Present Illness Pt is an 83 y.o. male presenting to hospital with new onset tremors B UE's>LE's, weakness, and confusion (pt discharged home from ED but unable to get into car and brought back to ED).  Pt admitted with elevated lactic acid level.  PMH includes s/p amputation L 3rd toe (s/p ostectomy L foot and I&D L foot 05/13/16), CAD, DM, neuropathy, MI, htn, HOH.   Clinical Impression   Pt is 83 year old male who presents with blurry vision (full assessment not yet completed), decreased coordination with gross and fine motor skills of RUEs and hands and pain which he could not rate with numbers but stated it hurt a little bit past 90 degrees of shoulder flexion.  He is very HOH and needed all instructions written out on paper. Pt. Is able to use R hand to write name and date with min cues for number for the day. Pt is mainly limited in ADLs due to decreased balance and decreased WB through LLE and has a post op shoe in place.  He needs min to mod assist for LB dressing skills due to this and is at risk for falls due to decreased stamina and fatigues after standing up for brief time with FWW.  Pt would benefit from skilled OT services to address ADL training, fine motor skills training, adaptive equipment training, strengthening, and family ed and training.  Pt would benefit from SNF for continued rehab after discharge from hospital.    Follow Up Recommendations  SNF    Equipment Recommendations       Recommendations for Other Services       Precautions / Restrictions Precautions Precautions: Fall Precaution Comments: needs instruction and questions written due to very HOH Restrictions Weight Bearing Restrictions: Yes Other Position/Activity Restrictions: Per pt's family: pt to always wear post op shoe on L foot when up (and pt does not tend to WB  on front of L foot d/t pain)      Mobility Bed Mobility Overal bed mobility: Needs Assistance Bed Mobility: Supine to Sit     Supine to sit: Mod assist;HOB elevated     General bed mobility comments: assist for trunk supine to sit  Transfers Overall transfer level: Needs assistance Equipment used: Rolling walker (2 wheeled) Transfers: Sit to/from [facility] Sit to Stand: Min assist Stand pivot transfers: Min assist (stand pivot chair with arms to recliner chair in hallway)       General transfer comment: pt requiring multiple attempts to stand from bed using RW and unable to stand with assist until given vc's for changing hand and feet positioning; min assist to initiate stand from bedside chair    Balance Overall balance assessment: Needs assistance Sitting-balance support: Bilateral upper extremity supported;Feet supported Sitting balance-Leahy Scale: Fair Sitting balance - Comments: static sitting   Standing balance support: Bilateral upper extremity supported Standing balance-Leahy Scale: Fair Standing balance comment: static standing                            ADL Overall ADL's : Needs assistance/impaired Eating/Feeding: Independent;Set up   Grooming: Wash/dry hands;Wash/dry face;Oral care;Brushing hair;Set up;Modified independent           Upper Body Dressing : Set up;Minimal assistance Upper Body Dressing Details (indicate cue type and reason): due to pain in R  shoulder Lower Body Dressing: Set up;Moderate assistance Lower Body Dressing Details (indicate cue type and reason): post ip shoe on L foot with some limited WB and decreased balance and endurance                     Vision Patient Visual Report: Blurring of vision Additional Comments: decreased clarity of vision both distance and far with good tracking; needs more thorough vision eval     Perception     Praxis      Pertinent Vitals/Pain Pain Assessment:  Faces Faces Pain Scale: Hurts a little bit Pain Location: R shoulder past 90 degrees flexion Pain Descriptors / Indicators: Discomfort;Nagging Pain Intervention(s): Limited activity within patient's tolerance;Monitored during session     Hand Dominance Right   Extremity/Trunk Assessment Upper Extremity Assessment Upper Extremity Assessment: Generalized weakness;RUE deficits/detail RUE Deficits / Details: AROM limited to 90 degrees flex due to pain in side of shoulder near biceps tendion insertion; intact sensation bilaterally   Lower Extremity Assessment Lower Extremity Assessment: Defer to PT evaluation       Communication Communication Communication: HOH   Cognition Arousal/Alertness: Awake/alert Behavior During Therapy: WFL for tasks assessed/performed Overall Cognitive Status: Within Functional Limits for tasks assessed                     General Comments  Pt very HOH (even with hearing aides in) and requiring therapist to write information on paper in order to communicate with pt during session.    Exercises       Shoulder Instructions      Home Living Family/patient expects to be discharged to:: Private residence Living Arrangements: Spouse/significant other Available Help at Discharge: Family Type of Home: House Home Access: Stairs to enter Technical brewer of Steps: 4 Entrance Stairs-Rails: Right;Left;Can reach both Home Layout: One level     Bathroom Shower/Tub: Tub/shower unit Shower/tub characteristics: Curtain Biochemist, clinical: Standard     Home Equipment: Environmental consultant - 4 wheels;Cane - single point          Prior Functioning/Environment Level of Independence: Independent with assistive device(s)        Comments: Pt's family reports pt walks short distances in home with walker.  2 falls in past 6 months.  Independent with ADL's.        OT Problem List: Decreased strength;Decreased range of motion;Decreased activity  tolerance;Decreased safety awareness;Impaired balance (sitting and/or standing);Impaired vision/perception;Pain      OT Treatment/Interventions: Self-care/ADL training;Therapeutic exercise;Patient/family education;Energy conservation;Therapeutic activities;Balance training;Visual/perceptual remediation/compensation    OT Goals(Current goals can be found in the care plan section) Acute Rehab OT Goals Patient Stated Goal: to get stronger OT Goal Formulation: With patient Time For Goal Achievement: 06/13/16 Potential to Achieve Goals: Good ADL Goals Pt Will Perform Lower Body Dressing: with set-up;with min assist;sit to/from stand (with no LOB) Pt Will Transfer to Toilet: with set-up;regular height toilet;with min guard assist  OT Frequency: Min 1X/week   Barriers to D/C:            Co-evaluation              End of Session    Activity Tolerance: Patient limited by fatigue Patient left: in chair;with call bell/phone within reach;with chair alarm set  OT Visit Diagnosis: Muscle weakness (generalized) (M62.81)                ADL either performed or assessed with clinical judgement  Time: 1610-9604 OT Time Calculation (min): 32 min  Charges:  OT General Charges $OT Visit: 1 Procedure OT Evaluation $OT Eval Low Complexity: 1 Procedure OT Treatments $Self Care/Home Management : 8-22 mins G-Codes: OT G-codes **NOT FOR INPATIENT CLASS** Functional Assessment Tool Used: Clinical judgement;AM-PAC 6 Clicks Daily Activity Functional Limitation: Self care Self Care Current Status (G8366): At least 20 percent but less than 40 percent impaired, limited or restricted Self Care Goal Status (Q9476): At least 1 percent but less than 20 percent impaired, limited or restricted   Chrys Racer, OTR/L ascom [phone] 05/30/16, 11:36 AM

## 2016-05-30 NOTE — Clinical Social Work Note (Signed)
Pt is ready for discharge today and will go to [facility]. [employer] Josem Kaufmann has been obtained by facility. Facility is ready for pt as they have received discharge information. Pt and family are aware and agreeable to discharge plan. RN will call report. [facility] will provide transportation. CSW is signing off as no further needs identified.   Darden Dates, MSW, LCSW Clinical Social Worker  [phone]

## 2016-05-30 NOTE — Discharge Summary (Signed)
[facility] [place] NAME: Matthew Bruce    MR#:  [id number]  DATE OF BIRTH:  [birth year]  DATE OF ADMISSION:  05/25/2016   ADMITTING PHYSICIAN: Lance Coon, MD  DATE OF DISCHARGE:  05/30/2016 PRIMARY CARE PHYSICIAN: Madelyn Brunner, MD   ADMISSION DIAGNOSIS:  Dehydration [E86.0] Coarse tremors [G25.2] Lung nodule [R91.1] DISCHARGE DIAGNOSIS:  Principal Problem:   Elevated lactic acid level Active Problems:   Essential hypertension   Diabetes (HCC)   PAD (peripheral artery disease) (HCC)   Weakness   Pressure injury of skin  SECONDARY DIAGNOSIS:   Past Medical History:  Diagnosis Date  . Anemia   . Aortic valve disorder   . Coronary artery disease   . Diabetes mellitus without complication ([place])   . Glaucoma   . History of BPH   . History of kidney stones   . HOH (hard of hearing)    Bilateral Hearing Aids  . Hyperlipidemia   . Hypertension   . Kidney stones   . MRSA (methicillin resistant staph aureus) culture positive 04/01/2016   LEFT FOOT  . Myocardial infarction   . Neuropathy ([place])   . Peripheral vascular disease [facility])    HOSPITAL COURSE:  Lactic acidosis- patient has had significant vomiting and is dehydrated, but there is also some concern for possible infection. However, no UTI. Maybe related to metformin. Improved with IV fluid support. Hold metformin.  Focal airspace opacity in the right upper lobe extending from the right supra hilar region into the pleural surface persistent since radiograph of 11/19/2015. Discontinued abx since no signs of PNA, possible malignancy and need to repeat chest CT in 6 months per Dr. Myrtice Lauth consult.   Leukocytosis. improved.  Weakness - suspect due to either profound dehydration, PT evaluation: SNF.  Acute renal failure. Improved withIV fluid support. Discontinued metformin and ibuprofen.  Essential hypertension - norvasc was holdas the  patient's blood pressure is borderline low. BP is elevated, resumed.  Diabetes ([place]) - sliding scale insulin with corresponding glucose checks. Hold metformin. Resume glipizide after discharge. Add lantus due to elevated BS.  PAD (peripheral artery disease) ([place]) - continue home meds  Slow healing wounds left foot.  Dr. Elvina Mattes recommend wet-to-dry saline dressings to the area was lightly wrapping gauze around the forefoot to avoid skin stresses. Change dressing daily with Kerlix gauze wetted with saline as well as a Kerlix wrap.   Right big toe necrotic skin. F/u Dr. Elvina Mattes. DISCHARGE CONDITIONS:   CONSULTS OBTAINED:  Treatment Team:  Albertine Patricia, DPM DRUG ALLERGIES:   Allergies  Allergen Reactions  . Shellfish Allergy Hives and Rash    Patient denies allergy (03/30/16)   DISCHARGE MEDICATIONS:   Allergies as of 05/30/2016      Reactions   Shellfish Allergy Hives, Rash   Patient denies allergy (03/30/16)      Medication List    STOP taking these medications   HYDROcodone-acetaminophen 5-325 MG tablet Commonly known as:  NORCO   ibuprofen 800 MG tablet Commonly known as:  ADVIL,MOTRIN   metFORMIN 1000 MG tablet Commonly known as:  GLUCOPHAGE   sulfamethoxazole-trimethoprim 800-160 MG tablet Commonly known as:  BACTRIM DS,SEPTRA DS     TAKE these medications   amLODipine 10 MG tablet Commonly known as:  NORVASC Take 1 tablet (10 mg total) by mouth at bedtime. What changed:  medication strength  how much to take  when to take this   clopidogrel 75 MG  tablet Commonly known as:  PLAVIX TAKE 1 TABLET BY MOUTH EVERY DAY   dorzolamide 2 % ophthalmic solution Commonly known as:  TRUSOPT Place 1 drop into both eyes at bedtime.   finasteride 5 MG tablet Commonly known as:  PROSCAR Take 5 mg by mouth at bedtime.   glipiZIDE 5 MG 24 hr tablet Commonly known as:  GLUCOTROL XL Take 5 mg by mouth daily.   latanoprost 0.005 % ophthalmic  solution Commonly known as:  XALATAN Place 1 drop into both eyes at bedtime.   lovastatin 40 MG tablet Commonly known as:  MEVACOR Take 40 mg by mouth every evening.   meclizine 12.5 MG tablet Commonly known as:  ANTIVERT Take 12.5 mg by mouth 4 (four) times daily as needed for dizziness.   neomycin-bacitracin-polymyxin ointment Commonly known as:  NEOSPORIN Apply 1 application topically daily as needed for wound care. apply to eye   senna-docusate 8.6-50 MG tablet Commonly known as:  Senokot-S Take 1 tablet by mouth at bedtime as needed for mild constipation.   tamsulosin 0.4 MG Caps capsule Commonly known as:  FLOMAX Take 1 capsule (0.4 mg total) by mouth daily. What changed:  See the new instructions.        DISCHARGE INSTRUCTIONS:  See AVS.  If you experience worsening of your admission symptoms, develop shortness of breath, life threatening emergency, suicidal or homicidal thoughts you must seek medical attention immediately by calling 911 or calling your MD immediately  if symptoms less severe.  You Must read complete instructions/literature along with all the possible adverse reactions/side effects for all the Medicines you take and that have been prescribed to you. Take any new Medicines after you have completely understood and accpet all the possible adverse reactions/side effects.   Please note  You were cared for by a hospitalist during your hospital stay. If you have any questions about your discharge medications or the care you received while you were in the hospital after you are discharged, you can call the unit and asked to speak with the hospitalist on call if the hospitalist that took care of you is not available. Once you are discharged, your primary care physician will handle any further medical issues. Please note that NO REFILLS for any discharge medications will be authorized once you are discharged, as it is imperative that you return to your primary care  physician (or establish a relationship with a primary care physician if you do not have one) for your aftercare needs so that they can reassess your need for medications and monitor your lab values.    On the day of Discharge:  VITAL SIGNS:  Blood pressure (!) 157/79, pulse 88, temperature 97.7 F (36.5 C), temperature source Oral, resp. rate 20, height 6' (1.829 m), weight 190 lb 6.4 oz (86.4 kg), SpO2 96 %. PHYSICAL EXAMINATION:  GENERAL:  83 y.o.-year-old patient lying in the bed with no acute distress.  EYES: Pupils equal, round, reactive to light and accommodation. No scleral icterus. Extraocular muscles intact.  HEENT: Head atraumatic, normocephalic. Oropharynx and nasopharynx clear.  NECK:  Supple, no jugular venous distention. No thyroid enlargement, no tenderness.  LUNGS: Normal breath sounds bilaterally, no wheezing, rales,rhonchi or crepitation. No use of accessory muscles of respiration.  CARDIOVASCULAR: S1, S2 normal. No murmurs, rubs, or gallops.  ABDOMEN: Soft, non-tender, non-distended. Bowel sounds present. No organomegaly or mass.  EXTREMITIES: No pedal edema, cyanosis, or clubbing.  NEUROLOGIC: Cranial nerves II through XII are intact. Muscle strength 5/5  in all extremities. Sensation intact. Gait not checked.  PSYCHIATRIC: The patient is alert and oriented x 3.  SKIN: No obvious rash, lesion, or ulcer.  DATA REVIEW:   CBC  Recent Labs Lab 05/27/16 0521  WBC 7.1  HGB 9.7*  HCT 27.2*  PLT 216    Chemistries   Recent Labs Lab 05/25/16 1640  05/29/16 0458  NA 138  < > 137  K 3.8  < > 4.0  CL 106  < > 107  CO2 21*  < > 24  GLUCOSE 157*  < > 229*  BUN 18  < > 19  CREATININE 1.38*  < > 1.21  CALCIUM 9.4  < > 9.1  AST 30  --   --   ALT 12*  --   --   ALKPHOS 72  --   --   BILITOT 0.5  --   --   < > = values in this interval not displayed.   Microbiology Results  Results for orders placed or performed during the hospital encounter of 05/25/16  Urine  culture     Status: Abnormal   Collection Time: 05/26/16 12:55 PM  Result Value Ref Range Status   Specimen Description URINE, RANDOM  Final   Special Requests NONE  Final   Culture (A)  Final    <10,000 COLONIES/mL INSIGNIFICANT GROWTH Performed at [facility], [place] [address], [place], [place] [address]    Report Status 05/27/2016 FINAL  Final    RADIOLOGY:  No results found.   Management plans discussed with the patient, family and they are in agreement.  CODE STATUS: Full Code   TOTAL TIME TAKING CARE OF THIS PATIENT: 32 minutes.    Demetrios Loll M.D on 05/30/2016 at 11:04 AM  Between 7am to 6pm - Pager - [phone]  After 6pm go to [email or web address] - Proofreader  [facility]  Office  [phone]  CC: Primary care physician; Madelyn Brunner, MD   Note: This dictation was prepared with Dragon dictation along with smaller phrase technology. Any transcriptional errors that result from this process are unintentional.

## 2016-05-30 NOTE — Plan of Care (Signed)
Pt is d/ced to [facility], [facility].  Gave report to Theressa Stamps.  Patient wked w/PT and OT today.  He ambulated with a walker but had to to get a chair b/c he was too weak to get back to his room.  Changed the dressing on the L foot.  There is some serosanguinous fluid in incision between toes.  There was a little bit of greenish d/c but not enough to come off gauze when cleaned w/NS. Surgical boot is on foot.  Pt has hearing aids in but still very HOH.  5 units insulin added to sliding scale.  Pt is not having good PO intake. IV removed.  EMS called for transport.

## 2016-06-02 DIAGNOSIS — H409 Unspecified glaucoma: Secondary | ICD-10-CM | POA: Diagnosis not present

## 2016-06-02 DIAGNOSIS — E785 Hyperlipidemia, unspecified: Secondary | ICD-10-CM | POA: Diagnosis not present

## 2016-06-02 DIAGNOSIS — I739 Peripheral vascular disease, unspecified: Secondary | ICD-10-CM | POA: Diagnosis not present

## 2016-06-02 DIAGNOSIS — I10 Essential (primary) hypertension: Secondary | ICD-10-CM | POA: Diagnosis not present

## 2016-06-02 DIAGNOSIS — E119 Type 2 diabetes mellitus without complications: Secondary | ICD-10-CM | POA: Diagnosis not present

## 2016-06-02 DIAGNOSIS — M6281 Muscle weakness (generalized): Secondary | ICD-10-CM | POA: Diagnosis not present

## 2016-06-11 DIAGNOSIS — E785 Hyperlipidemia, unspecified: Secondary | ICD-10-CM | POA: Diagnosis not present

## 2016-06-11 DIAGNOSIS — I10 Essential (primary) hypertension: Secondary | ICD-10-CM | POA: Diagnosis not present

## 2016-06-11 DIAGNOSIS — M6281 Muscle weakness (generalized): Secondary | ICD-10-CM | POA: Diagnosis not present

## 2016-06-11 DIAGNOSIS — I739 Peripheral vascular disease, unspecified: Secondary | ICD-10-CM | POA: Diagnosis not present

## 2016-06-11 DIAGNOSIS — E119 Type 2 diabetes mellitus without complications: Secondary | ICD-10-CM | POA: Diagnosis not present

## 2016-06-11 DIAGNOSIS — H409 Unspecified glaucoma: Secondary | ICD-10-CM | POA: Diagnosis not present

## 2016-06-14 ENCOUNTER — Other Ambulatory Visit: Payer: Self-pay

## 2016-06-14 NOTE — Patient Outreach (Signed)
[facility]) Care Management  06/14/2016  Matthew Bruce 11-Aug-1926 [id number]      Transition of Care Referral  Referral Date: 06/14/16 Referral Source: [facility] Discharge Report Date of Discharge: 06/13/16 Facility: [facility] Insurance: [place] attempt # 1 to patient. A male answered the phone and reported that patient was not available.   `  Plan: RN CM will make outreach attempt to patient within one business day.   Enzo Montgomery, RN,BSN,CCM [place] Management Telephonic Care Management Coordinator Direct Phone: [phone] Toll Free: [phone] Fax: [phone]

## 2016-06-15 ENCOUNTER — Other Ambulatory Visit: Payer: Self-pay

## 2016-06-15 DIAGNOSIS — Z7902 Long term (current) use of antithrombotics/antiplatelets: Secondary | ICD-10-CM | POA: Diagnosis not present

## 2016-06-15 DIAGNOSIS — Z792 Long term (current) use of antibiotics: Secondary | ICD-10-CM | POA: Diagnosis not present

## 2016-06-15 DIAGNOSIS — L97529 Non-pressure chronic ulcer of other part of left foot with unspecified severity: Secondary | ICD-10-CM | POA: Diagnosis not present

## 2016-06-15 DIAGNOSIS — E11621 Type 2 diabetes mellitus with foot ulcer: Secondary | ICD-10-CM | POA: Diagnosis not present

## 2016-06-15 DIAGNOSIS — T8781 Dehiscence of amputation stump: Secondary | ICD-10-CM | POA: Diagnosis not present

## 2016-06-15 DIAGNOSIS — Z9181 History of falling: Secondary | ICD-10-CM | POA: Diagnosis not present

## 2016-06-15 DIAGNOSIS — Z7984 Long term (current) use of oral hypoglycemic drugs: Secondary | ICD-10-CM | POA: Diagnosis not present

## 2016-06-15 NOTE — Patient Outreach (Signed)
[facility]) Care Management  06/15/2016  Matthew Bruce 1926-10-22 [id number]   Transition of Care Referral  Referral Date: 06/14/16 Referral Source: [facility] Discharge Report Date of Discharge: 06/13/16 Facility: [facility] Insurance: [employer]     Outreach attempt #2 to patient. Male answered the phone and identified herself as patient's dtr. She states that patient was still asleep.    Plan: RN CM will make outreach attempt to patient within one business day if no return call.   Enzo Montgomery, RN,BSN,CCM [place] Management Telephonic Care Management Coordinator Direct Phone: [phone] Toll Free: [phone] Fax: [phone]

## 2016-06-16 ENCOUNTER — Other Ambulatory Visit: Payer: Self-pay

## 2016-06-16 DIAGNOSIS — N179 Acute kidney failure, unspecified: Secondary | ICD-10-CM | POA: Diagnosis not present

## 2016-06-16 DIAGNOSIS — Z7902 Long term (current) use of antithrombotics/antiplatelets: Secondary | ICD-10-CM | POA: Diagnosis not present

## 2016-06-16 DIAGNOSIS — Z7984 Long term (current) use of oral hypoglycemic drugs: Secondary | ICD-10-CM | POA: Diagnosis not present

## 2016-06-16 DIAGNOSIS — Z9181 History of falling: Secondary | ICD-10-CM | POA: Diagnosis not present

## 2016-06-16 DIAGNOSIS — Z792 Long term (current) use of antibiotics: Secondary | ICD-10-CM | POA: Diagnosis not present

## 2016-06-16 DIAGNOSIS — E11621 Type 2 diabetes mellitus with foot ulcer: Secondary | ICD-10-CM | POA: Diagnosis not present

## 2016-06-16 DIAGNOSIS — T8781 Dehiscence of amputation stump: Secondary | ICD-10-CM | POA: Diagnosis not present

## 2016-06-16 DIAGNOSIS — E1165 Type 2 diabetes mellitus with hyperglycemia: Secondary | ICD-10-CM | POA: Diagnosis not present

## 2016-06-16 DIAGNOSIS — L97529 Non-pressure chronic ulcer of other part of left foot with unspecified severity: Secondary | ICD-10-CM | POA: Diagnosis not present

## 2016-06-16 NOTE — Patient Outreach (Signed)
[facility]) Care Management  06/16/2016  Matthew Bruce 07/23/1926 [id number]   Transition of Care Referral  Referral Date: 06/14/16 Referral Source: [facility] Discharge Report Date of Discharge: 06/13/16 Facility: [facility] Insurance: [place] attempt # 1 to patient. No answer at present and unable to leave message.    Plan: RN CM will send unsuccessful outreach letter to patient and close case if no response from patient within 10 business days.   Enzo Montgomery, RN,BSN,CCM [place] Management Telephonic Care Management Coordinator Direct Phone: [phone] Toll Free: [phone] Fax: [phone]

## 2016-06-21 DIAGNOSIS — Z7984 Long term (current) use of oral hypoglycemic drugs: Secondary | ICD-10-CM | POA: Diagnosis not present

## 2016-06-21 DIAGNOSIS — E11621 Type 2 diabetes mellitus with foot ulcer: Secondary | ICD-10-CM | POA: Diagnosis not present

## 2016-06-21 DIAGNOSIS — Z9181 History of falling: Secondary | ICD-10-CM | POA: Diagnosis not present

## 2016-06-21 DIAGNOSIS — Z792 Long term (current) use of antibiotics: Secondary | ICD-10-CM | POA: Diagnosis not present

## 2016-06-21 DIAGNOSIS — L97529 Non-pressure chronic ulcer of other part of left foot with unspecified severity: Secondary | ICD-10-CM | POA: Diagnosis not present

## 2016-06-21 DIAGNOSIS — Z7902 Long term (current) use of antithrombotics/antiplatelets: Secondary | ICD-10-CM | POA: Diagnosis not present

## 2016-06-21 DIAGNOSIS — T8781 Dehiscence of amputation stump: Secondary | ICD-10-CM | POA: Diagnosis not present

## 2016-06-23 DIAGNOSIS — Z7902 Long term (current) use of antithrombotics/antiplatelets: Secondary | ICD-10-CM | POA: Diagnosis not present

## 2016-06-23 DIAGNOSIS — E11621 Type 2 diabetes mellitus with foot ulcer: Secondary | ICD-10-CM | POA: Diagnosis not present

## 2016-06-23 DIAGNOSIS — Z7984 Long term (current) use of oral hypoglycemic drugs: Secondary | ICD-10-CM | POA: Diagnosis not present

## 2016-06-23 DIAGNOSIS — Z792 Long term (current) use of antibiotics: Secondary | ICD-10-CM | POA: Diagnosis not present

## 2016-06-23 DIAGNOSIS — L97529 Non-pressure chronic ulcer of other part of left foot with unspecified severity: Secondary | ICD-10-CM | POA: Diagnosis not present

## 2016-06-23 DIAGNOSIS — T8781 Dehiscence of amputation stump: Secondary | ICD-10-CM | POA: Diagnosis not present

## 2016-06-23 DIAGNOSIS — Z9181 History of falling: Secondary | ICD-10-CM | POA: Diagnosis not present

## 2016-06-27 DIAGNOSIS — L97529 Non-pressure chronic ulcer of other part of left foot with unspecified severity: Secondary | ICD-10-CM | POA: Diagnosis not present

## 2016-06-27 DIAGNOSIS — Z792 Long term (current) use of antibiotics: Secondary | ICD-10-CM | POA: Diagnosis not present

## 2016-06-27 DIAGNOSIS — T8781 Dehiscence of amputation stump: Secondary | ICD-10-CM | POA: Diagnosis not present

## 2016-06-27 DIAGNOSIS — E11621 Type 2 diabetes mellitus with foot ulcer: Secondary | ICD-10-CM | POA: Diagnosis not present

## 2016-06-27 DIAGNOSIS — Z7984 Long term (current) use of oral hypoglycemic drugs: Secondary | ICD-10-CM | POA: Diagnosis not present

## 2016-06-27 DIAGNOSIS — Z7902 Long term (current) use of antithrombotics/antiplatelets: Secondary | ICD-10-CM | POA: Diagnosis not present

## 2016-06-27 DIAGNOSIS — Z9181 History of falling: Secondary | ICD-10-CM | POA: Diagnosis not present

## 2016-06-28 DIAGNOSIS — L97522 Non-pressure chronic ulcer of other part of left foot with fat layer exposed: Secondary | ICD-10-CM | POA: Diagnosis not present

## 2016-06-29 ENCOUNTER — Encounter (INDEPENDENT_AMBULATORY_CARE_PROVIDER_SITE_OTHER): Payer: Self-pay

## 2016-06-29 ENCOUNTER — Ambulatory Visit (INDEPENDENT_AMBULATORY_CARE_PROVIDER_SITE_OTHER): Payer: Medicare HMO | Admitting: Vascular Surgery

## 2016-06-29 ENCOUNTER — Other Ambulatory Visit (INDEPENDENT_AMBULATORY_CARE_PROVIDER_SITE_OTHER): Payer: Medicare HMO

## 2016-06-29 ENCOUNTER — Other Ambulatory Visit (INDEPENDENT_AMBULATORY_CARE_PROVIDER_SITE_OTHER): Payer: Self-pay | Admitting: Vascular Surgery

## 2016-06-29 ENCOUNTER — Encounter (INDEPENDENT_AMBULATORY_CARE_PROVIDER_SITE_OTHER): Payer: Self-pay | Admitting: Vascular Surgery

## 2016-06-29 VITALS — BP 143/83 | HR 73 | Resp 16 | Ht 72.0 in | Wt 196.0 lb

## 2016-06-29 DIAGNOSIS — I739 Peripheral vascular disease, unspecified: Secondary | ICD-10-CM

## 2016-06-29 DIAGNOSIS — L97509 Non-pressure chronic ulcer of other part of unspecified foot with unspecified severity: Secondary | ICD-10-CM | POA: Diagnosis not present

## 2016-06-29 DIAGNOSIS — I70235 Atherosclerosis of native arteries of right leg with ulceration of other part of foot: Secondary | ICD-10-CM | POA: Diagnosis not present

## 2016-06-29 DIAGNOSIS — E1152 Type 2 diabetes mellitus with diabetic peripheral angiopathy with gangrene: Secondary | ICD-10-CM | POA: Diagnosis not present

## 2016-06-29 NOTE — Progress Notes (Signed)
Subjective:    Patient ID: Matthew Bruce, male    DOB: 17-Jun-1926, 83 y.o.   MRN: [id number] Chief Complaint  Patient presents with  . Re-evaluation    Needs angio per [place]   Patient presents at the request of Dr. Elvina Mattes for a right lower extremity non-healing ulceration (big toe). The patient was last seen on 04/01/16 during a left lower extremity angiogram. Patient did not follow up in our office after. A STAT ABI was performed. The ABI showed the right lower extremity with decreased perfusion and almost flatline digit readings. Left ABI with decreased perfusion however not as severe as the right extremity. Patient denies any claudication like symptoms or rest pain. Denies any fever, nausea or vomiting.    Review of Systems  Constitutional: Negative.   HENT: Negative.   Eyes: Negative.   Respiratory: Negative.   Cardiovascular: Negative.   Gastrointestinal: Negative.   Endocrine: Negative.   Genitourinary: Negative.   Musculoskeletal: Negative.   Skin: Positive for wound.  Allergic/Immunologic: Negative.   Neurological: Negative.   Hematological: Negative.   Psychiatric/Behavioral: Negative.       Objective:   Physical Exam  Constitutional: He is oriented to person, place, and time. He appears well-developed and well-nourished. No distress.  HENT:  Head: Normocephalic and atraumatic.  Eyes: Conjunctivae are normal. Pupils are equal, round, and reactive to light.  Neck: Normal range of motion.  Cardiovascular: Normal rate, regular rhythm and normal heart sounds.   Pulses:      Radial pulses are 2+ on the right side, and 2+ on the left side.       Dorsalis pedis pulses are 0 on the right side, and 1+ on the left side.       Posterior tibial pulses are 0 on the right side, and 1+ on the left side.  Pulmonary/Chest: Effort normal.  Musculoskeletal: Normal range of motion. He exhibits no edema.  Neurological: He is alert and oriented to person, place, and time.    Skin: He is not diaphoretic.  Right Big Toe: Tip necrotic. No drainage. No cellulitis. Would bed with granulation.   Psychiatric: He has a normal mood and affect. His behavior is normal. Judgment and thought content normal.   BP (!) 143/83 (BP Location: Right Arm)   Pulse 73   Resp 16   Ht 6' (1.829 m)   Wt 196 lb (88.9 kg)   BMI 26.58 kg/m   Past Medical History:  Diagnosis Date  . Anemia   . Aortic valve disorder   . Coronary artery disease   . Diabetes mellitus without complication ([place])   . Glaucoma   . History of BPH   . History of kidney stones   . HOH (hard of hearing)    Bilateral Hearing Aids  . Hyperlipidemia   . Hypertension   . Kidney stones   . MRSA (methicillin resistant staph aureus) culture positive 04/01/2016   LEFT FOOT  . Myocardial infarction   . Neuropathy ([place])   . Peripheral vascular disease [facility])    Social History   Social History  . Marital status: Married    Spouse name: N/A  . Number of children: N/A  . Years of education: N/A   Occupational History  . Not on file.   Social History Main Topics  . Smoking status: Former Smoker    Packs/day: 1.00    Types: Cigarettes  . Smokeless tobacco: Current User    Types: Chew  . Alcohol  use No  . Drug use: No  . Sexual activity: No   Other Topics Concern  . Not on file   Social History Narrative  . No narrative on file   Past Surgical History:  Procedure Laterality Date  . AMPUTATION TOE Left 02/11/2016   Procedure: AMPUTATION TOE;  Surgeon: Albertine Patricia, DPM;  Location: [facility] ORS;  Service: Podiatry;  Laterality: Left;  . APPLICATION OF WOUND VAC Left 04/01/2016   Procedure: APPLICATION OF WOUND VAC;  Surgeon: Albertine Patricia, DPM;  Location: [facility] ORS;  Service: Podiatry;  Laterality: Left;  . CARDIAC CATHETERIZATION    . CARDIAC SURGERY    . CARDIAC VALVE REPLACEMENT    . CORONARY ARTERY BYPASS GRAFT    . IRRIGATION AND DEBRIDEMENT FOOT Left 04/01/2016   Procedure: IRRIGATION  AND DEBRIDEMENT FOOT;  Surgeon: Albertine Patricia, DPM;  Location: [facility] ORS;  Service: Podiatry;  Laterality: Left;  . IRRIGATION AND DEBRIDEMENT FOOT Left 05/13/2016   Procedure: IRRIGATION AND DEBRIDEMENT FOOT;  Surgeon: Albertine Patricia, DPM;  Location: [facility] ORS;  Service: Podiatry;  Laterality: Left;  . OSTECTOMY Left 05/13/2016   Procedure: OSTECTOMY;  Surgeon: Albertine Patricia, DPM;  Location: [facility] ORS;  Service: Podiatry;  Laterality: Left;  . PERIPHERAL VASCULAR CATHETERIZATION Left 02/03/2016   Procedure: Lower Extremity Angiography;  Surgeon: Algernon Huxley, MD;  Location: [place] [facility];  Service: Cardiovascular;  Laterality: Left;  . PERIPHERAL VASCULAR CATHETERIZATION Left 04/01/2016   Procedure: Lower Extremity Angiography;  Surgeon: Katha Cabal, MD;  Location: [facility];  Service: Cardiovascular;  Laterality: Left;  Marland Kitchen VALVE REPLACEMENT  2007   Aortic Valve Replacement, St. Jude Porcine Valve   Family History  Problem Relation Age of Onset  . Heart attack Mother   . Alcohol abuse Father   . Throat cancer Sister   . Heart attack Brother   . Heart failure Neg Hx    Allergies  Allergen Reactions  . Shellfish Allergy Hives and Rash    Patient denies allergy (03/30/16)       Assessment & Plan:  Patient presents at the request of Dr. Elvina Mattes for a right lower extremity non-healing ulceration (big toe). The patient was last seen on 04/01/16 during a left lower extremity angiogram. Patient did not follow up in our office after. A STAT ABI was performed. The ABI showed the right lower extremity with decreased perfusion and almost flatline digit readings. Left ABI with decreased perfusion however not as severe as the right extremity. Patient denies any claudication like symptoms or rest pain. Denies any fever, nausea or vomiting.   1. PAD (peripheral artery disease) ([place]) - Worsening Patient with RLE big toe wound. No right pedal pusles on exam. ABI with significant  arterial disease (RLE). Recommend RLE angiogram in an attempt to revascularize the extremity.  Procedure, risks and benefits explained to patient. All questions answered. Patient and family are willing to proceed.   2. Type 2 diabetes mellitus with diabetic peripheral angiopathy with gangrene, unspecified long term insulin use status ([place]) - Stable Encouraged good control as its slows the progression of atherosclerotic disease  3. Atherosclerosis of native artery of right lower extremity with ulceration of other part of foot [facility]) - New Patient with RLE big toe wound. No right pedal pusles on exam. ABI with significant arterial disease (RLE). Recommend RLE angiogram in an attempt to revascularize the extremity.  Procedure, risks and benefits explained to patient. All questions answered. Patient and family are willing to  proceed  Current Outpatient Prescriptions on File Prior to Visit  Medication Sig Dispense Refill  . amLODipine (NORVASC) 10 MG tablet Take 1 tablet (10 mg total) by mouth at bedtime.    . clopidogrel (PLAVIX) 75 MG tablet TAKE 1 TABLET BY MOUTH EVERY DAY 30 tablet 3  . dorzolamide (TRUSOPT) 2 % ophthalmic solution Place 1 drop into both eyes at bedtime.     . finasteride (PROSCAR) 5 MG tablet Take 5 mg by mouth at bedtime.     Marland Kitchen glipiZIDE (GLUCOTROL XL) 5 MG 24 hr tablet Take 5 mg by mouth daily.    Marland Kitchen latanoprost (XALATAN) 0.005 % ophthalmic solution Place 1 drop into both eyes at bedtime.    . lovastatin (MEVACOR) 40 MG tablet Take 40 mg by mouth every evening.     . meclizine (ANTIVERT) 12.5 MG tablet Take 12.5 mg by mouth 4 (four) times daily as needed for dizziness.     . neomycin-bacitracin-polymyxin (NEOSPORIN) ointment Apply 1 application topically daily as needed for wound care. apply to eye    . senna-docusate (SENOKOT-S) 8.6-50 MG tablet Take 1 tablet by mouth at bedtime as needed for mild constipation.    . tamsulosin (FLOMAX) 0.4 MG CAPS capsule Take 1 capsule  (0.4 mg total) by mouth daily. 30 capsule    No current facility-administered medications on file prior to visit.     There are no Patient Instructions on file for this visit. No Follow-up on file.   Brance Dartt A Shantice Menger, PA-C

## 2016-06-30 DIAGNOSIS — Z7902 Long term (current) use of antithrombotics/antiplatelets: Secondary | ICD-10-CM | POA: Diagnosis not present

## 2016-06-30 DIAGNOSIS — Z792 Long term (current) use of antibiotics: Secondary | ICD-10-CM | POA: Diagnosis not present

## 2016-06-30 DIAGNOSIS — E11621 Type 2 diabetes mellitus with foot ulcer: Secondary | ICD-10-CM | POA: Diagnosis not present

## 2016-06-30 DIAGNOSIS — T8781 Dehiscence of amputation stump: Secondary | ICD-10-CM | POA: Diagnosis not present

## 2016-06-30 DIAGNOSIS — L97529 Non-pressure chronic ulcer of other part of left foot with unspecified severity: Secondary | ICD-10-CM | POA: Diagnosis not present

## 2016-06-30 DIAGNOSIS — Z9181 History of falling: Secondary | ICD-10-CM | POA: Diagnosis not present

## 2016-06-30 DIAGNOSIS — Z7984 Long term (current) use of oral hypoglycemic drugs: Secondary | ICD-10-CM | POA: Diagnosis not present

## 2016-07-01 ENCOUNTER — Other Ambulatory Visit (INDEPENDENT_AMBULATORY_CARE_PROVIDER_SITE_OTHER): Payer: Self-pay | Admitting: Vascular Surgery

## 2016-07-01 ENCOUNTER — Other Ambulatory Visit: Payer: Self-pay

## 2016-07-01 NOTE — Patient Outreach (Signed)
[facility]) Care Management  07/01/2016  Matthew Bruce 1926-12-03 [id number]   Transition of Care Referral  Referral Date: 06/14/16 Referral Source: [employer] Discharge Report Date of Discharge: 06/13/16 Facility: [facility] Insurance: [employer]   Multiple attempts to establish contact with patient. No response from letter mailed to patient. Case is being closed at this time.    Plan: RN CM will notify [facility] administrative assistant of case status. RN CM will send MD case closure letter.   Enzo Montgomery, RN,BSN,CCM [place] Management Telephonic Care Management Coordinator Direct Phone: [phone] Toll Free: [phone] Fax: [phone]

## 2016-07-05 ENCOUNTER — Encounter
Admission: RE | Admit: 2016-07-05 | Discharge: 2016-07-05 | Disposition: A | Discharge status: Home / Self Care | Payer: Medicare HMO | Source: Ambulatory Visit | Attending: Vascular Surgery | Admitting: Vascular Surgery

## 2016-07-05 DIAGNOSIS — E1140 Type 2 diabetes mellitus with diabetic neuropathy, unspecified: Secondary | ICD-10-CM | POA: Diagnosis not present

## 2016-07-05 DIAGNOSIS — H409 Unspecified glaucoma: Secondary | ICD-10-CM | POA: Diagnosis not present

## 2016-07-05 DIAGNOSIS — Z7902 Long term (current) use of antithrombotics/antiplatelets: Secondary | ICD-10-CM | POA: Diagnosis not present

## 2016-07-05 DIAGNOSIS — L97518 Non-pressure chronic ulcer of other part of right foot with other specified severity: Secondary | ICD-10-CM | POA: Diagnosis not present

## 2016-07-05 DIAGNOSIS — D649 Anemia, unspecified: Secondary | ICD-10-CM | POA: Diagnosis not present

## 2016-07-05 DIAGNOSIS — Z7984 Long term (current) use of oral hypoglycemic drugs: Secondary | ICD-10-CM | POA: Diagnosis not present

## 2016-07-05 DIAGNOSIS — Z87891 Personal history of nicotine dependence: Secondary | ICD-10-CM | POA: Diagnosis not present

## 2016-07-05 DIAGNOSIS — I252 Old myocardial infarction: Secondary | ICD-10-CM | POA: Diagnosis not present

## 2016-07-05 DIAGNOSIS — I10 Essential (primary) hypertension: Secondary | ICD-10-CM | POA: Diagnosis not present

## 2016-07-05 DIAGNOSIS — Z89422 Acquired absence of other left toe(s): Secondary | ICD-10-CM | POA: Diagnosis not present

## 2016-07-05 DIAGNOSIS — Z87442 Personal history of urinary calculi: Secondary | ICD-10-CM | POA: Diagnosis not present

## 2016-07-05 DIAGNOSIS — Z8614 Personal history of Methicillin resistant Staphylococcus aureus infection: Secondary | ICD-10-CM | POA: Diagnosis not present

## 2016-07-05 DIAGNOSIS — E119 Type 2 diabetes mellitus without complications: Secondary | ICD-10-CM | POA: Diagnosis not present

## 2016-07-05 DIAGNOSIS — Z87448 Personal history of other diseases of urinary system: Secondary | ICD-10-CM | POA: Diagnosis not present

## 2016-07-05 DIAGNOSIS — I70235 Atherosclerosis of native arteries of right leg with ulceration of other part of foot: Secondary | ICD-10-CM | POA: Diagnosis not present

## 2016-07-05 DIAGNOSIS — E785 Hyperlipidemia, unspecified: Secondary | ICD-10-CM | POA: Diagnosis not present

## 2016-07-05 DIAGNOSIS — Z9889 Other specified postprocedural states: Secondary | ICD-10-CM | POA: Diagnosis not present

## 2016-07-05 DIAGNOSIS — E11621 Type 2 diabetes mellitus with foot ulcer: Secondary | ICD-10-CM | POA: Diagnosis not present

## 2016-07-05 DIAGNOSIS — I359 Nonrheumatic aortic valve disorder, unspecified: Secondary | ICD-10-CM | POA: Diagnosis not present

## 2016-07-05 DIAGNOSIS — Z951 Presence of aortocoronary bypass graft: Secondary | ICD-10-CM | POA: Diagnosis not present

## 2016-07-05 DIAGNOSIS — Z8249 Family history of ischemic heart disease and other diseases of the circulatory system: Secondary | ICD-10-CM | POA: Diagnosis not present

## 2016-07-05 DIAGNOSIS — Z952 Presence of prosthetic heart valve: Secondary | ICD-10-CM | POA: Diagnosis not present

## 2016-07-05 DIAGNOSIS — Z91013 Allergy to seafood: Secondary | ICD-10-CM | POA: Diagnosis not present

## 2016-07-05 DIAGNOSIS — H9190 Unspecified hearing loss, unspecified ear: Secondary | ICD-10-CM | POA: Diagnosis not present

## 2016-07-05 LAB — BUN: BUN: 20 mg/dL (ref 6–20)

## 2016-07-05 LAB — CREATININE, SERUM
Creatinine, Ser: 1.2 mg/dL (ref 0.61–1.24)
GFR calc Af Amer: >60 mL/min (ref >60)
GFR calc non Af Amer: 53 mL/min — ABNORMAL LOW (ref >60)

## 2016-07-05 MED ORDER — CEFAZOLIN SODIUM-DEXTROSE 2-4 GM/100ML-% IV SOLN
2.0000 g | Freq: Once | INTRAVENOUS | Status: DC
  Filled 2016-07-05: qty 100

## 2016-07-06 ENCOUNTER — Encounter
Admission: RE | Disposition: A | Discharge status: Home / Self Care | Payer: Self-pay | Source: Ambulatory Visit | Attending: Vascular Surgery

## 2016-07-06 ENCOUNTER — Ambulatory Visit
Admission: RE | Admit: 2016-07-06 | Discharge: 2016-07-06 | Disposition: A | Discharge status: Home / Self Care | Payer: Medicare HMO | Source: Ambulatory Visit | Attending: Vascular Surgery | Admitting: Vascular Surgery

## 2016-07-06 ENCOUNTER — Encounter: Payer: Self-pay | Admitting: *Deleted

## 2016-07-06 DIAGNOSIS — I70235 Atherosclerosis of native arteries of right leg with ulceration of other part of foot: Secondary | ICD-10-CM | POA: Diagnosis not present

## 2016-07-06 DIAGNOSIS — Z811 Family history of alcohol abuse and dependence: Secondary | ICD-10-CM | POA: Insufficient documentation

## 2016-07-06 DIAGNOSIS — I252 Old myocardial infarction: Secondary | ICD-10-CM | POA: Insufficient documentation

## 2016-07-06 DIAGNOSIS — Z91013 Allergy to seafood: Secondary | ICD-10-CM | POA: Insufficient documentation

## 2016-07-06 DIAGNOSIS — E785 Hyperlipidemia, unspecified: Secondary | ICD-10-CM | POA: Diagnosis not present

## 2016-07-06 DIAGNOSIS — D649 Anemia, unspecified: Secondary | ICD-10-CM | POA: Insufficient documentation

## 2016-07-06 DIAGNOSIS — Z952 Presence of prosthetic heart valve: Secondary | ICD-10-CM | POA: Insufficient documentation

## 2016-07-06 DIAGNOSIS — I359 Nonrheumatic aortic valve disorder, unspecified: Secondary | ICD-10-CM | POA: Insufficient documentation

## 2016-07-06 DIAGNOSIS — I10 Essential (primary) hypertension: Secondary | ICD-10-CM | POA: Diagnosis not present

## 2016-07-06 DIAGNOSIS — Z87448 Personal history of other diseases of urinary system: Secondary | ICD-10-CM | POA: Insufficient documentation

## 2016-07-06 DIAGNOSIS — Z7902 Long term (current) use of antithrombotics/antiplatelets: Secondary | ICD-10-CM | POA: Insufficient documentation

## 2016-07-06 DIAGNOSIS — L97518 Non-pressure chronic ulcer of other part of right foot with other specified severity: Secondary | ICD-10-CM | POA: Diagnosis not present

## 2016-07-06 DIAGNOSIS — Z87442 Personal history of urinary calculi: Secondary | ICD-10-CM | POA: Insufficient documentation

## 2016-07-06 DIAGNOSIS — E11621 Type 2 diabetes mellitus with foot ulcer: Secondary | ICD-10-CM | POA: Diagnosis not present

## 2016-07-06 DIAGNOSIS — Z8614 Personal history of Methicillin resistant Staphylococcus aureus infection: Secondary | ICD-10-CM | POA: Insufficient documentation

## 2016-07-06 DIAGNOSIS — I70238 Atherosclerosis of native arteries of right leg with ulceration of other part of lower right leg: Secondary | ICD-10-CM | POA: Diagnosis not present

## 2016-07-06 DIAGNOSIS — Z89422 Acquired absence of other left toe(s): Secondary | ICD-10-CM | POA: Insufficient documentation

## 2016-07-06 DIAGNOSIS — Z800 Family history of malignant neoplasm of digestive organs: Secondary | ICD-10-CM | POA: Insufficient documentation

## 2016-07-06 DIAGNOSIS — E119 Type 2 diabetes mellitus without complications: Secondary | ICD-10-CM | POA: Diagnosis not present

## 2016-07-06 DIAGNOSIS — H409 Unspecified glaucoma: Secondary | ICD-10-CM | POA: Insufficient documentation

## 2016-07-06 DIAGNOSIS — E1140 Type 2 diabetes mellitus with diabetic neuropathy, unspecified: Secondary | ICD-10-CM | POA: Diagnosis not present

## 2016-07-06 DIAGNOSIS — Z9889 Other specified postprocedural states: Secondary | ICD-10-CM | POA: Insufficient documentation

## 2016-07-06 DIAGNOSIS — H9190 Unspecified hearing loss, unspecified ear: Secondary | ICD-10-CM | POA: Insufficient documentation

## 2016-07-06 DIAGNOSIS — Z951 Presence of aortocoronary bypass graft: Secondary | ICD-10-CM | POA: Insufficient documentation

## 2016-07-06 DIAGNOSIS — Z87891 Personal history of nicotine dependence: Secondary | ICD-10-CM | POA: Insufficient documentation

## 2016-07-06 DIAGNOSIS — Z8249 Family history of ischemic heart disease and other diseases of the circulatory system: Secondary | ICD-10-CM | POA: Insufficient documentation

## 2016-07-06 DIAGNOSIS — Z7984 Long term (current) use of oral hypoglycemic drugs: Secondary | ICD-10-CM | POA: Insufficient documentation

## 2016-07-06 HISTORY — PX: LOWER EXTREMITY ANGIOGRAPHY: CATH118251

## 2016-07-06 SURGERY — LOWER EXTREMITY ANGIOGRAPHY
Anesthesia: Moderate Sedation | Laterality: Right

## 2016-07-06 MED ORDER — METHYLPREDNISOLONE SODIUM SUCC 125 MG IJ SOLR: 125.0000 mg | INTRAMUSCULAR | Status: DC | PRN

## 2016-07-06 MED ORDER — HYDROMORPHONE HCL 1 MG/ML IJ SOLN: 0.5000 mg | INTRAMUSCULAR | Status: DC | PRN

## 2016-07-06 MED ORDER — SODIUM CHLORIDE 0.9 % IV SOLN: 500.0000 mL | Freq: Once | INTRAVENOUS | Status: DC | PRN

## 2016-07-06 MED ORDER — FAMOTIDINE 20 MG PO TABS
ORAL_TABLET | ORAL | Status: AC
  Administered 2016-07-06: 10:00:00
  Filled 2016-07-06: qty 2

## 2016-07-06 MED ORDER — FENTANYL CITRATE (PF) 100 MCG/2ML IJ SOLN
INTRAMUSCULAR | Status: DC | PRN
  Administered 2016-07-06: 50 ug via INTRAVENOUS
  Administered 2016-07-06 (×2): 25 ug via INTRAVENOUS

## 2016-07-06 MED ORDER — MIDAZOLAM HCL 5 MG/5ML IJ SOLN
INTRAMUSCULAR | Status: AC
  Filled 2016-07-06: qty 5

## 2016-07-06 MED ORDER — OXYCODONE-ACETAMINOPHEN 5-325 MG PO TABS: 1.0000 | ORAL_TABLET | ORAL | Status: DC | PRN

## 2016-07-06 MED ORDER — METOPROLOL TARTRATE 5 MG/5ML IV SOLN: 2.0000 mg | INTRAVENOUS | Status: DC | PRN

## 2016-07-06 MED ORDER — ONDANSETRON HCL 4 MG/2ML IJ SOLN: 4.0000 mg | Freq: Four times a day (QID) | INTRAMUSCULAR | Status: DC | PRN

## 2016-07-06 MED ORDER — LABETALOL HCL 5 MG/ML IV SOLN: 10.0000 mg | INTRAVENOUS | Status: DC | PRN

## 2016-07-06 MED ORDER — HEPARIN SODIUM (PORCINE) 1000 UNIT/ML IJ SOLN
INTRAMUSCULAR | Status: DC | PRN
  Administered 2016-07-06: 5000 [IU] via INTRAVENOUS

## 2016-07-06 MED ORDER — GUAIFENESIN-DM 100-10 MG/5ML PO SYRP
15.0000 mL | ORAL_SOLUTION | ORAL | Status: DC | PRN
  Filled 2016-07-06: qty 15

## 2016-07-06 MED ORDER — HEPARIN (PORCINE) IN NACL 2-0.9 UNIT/ML-% IJ SOLN
INTRAMUSCULAR | Status: AC
  Filled 2016-07-06: qty 1000

## 2016-07-06 MED ORDER — PHENOL 1.4 % MT LIQD
1.0000 | OROMUCOSAL | Status: DC | PRN
  Filled 2016-07-06: qty 177

## 2016-07-06 MED ORDER — MIDAZOLAM HCL 2 MG/2ML IJ SOLN
1.0000 mg | Freq: Once | INTRAMUSCULAR | Status: AC
  Administered 2016-07-06: 1 mg via INTRAVENOUS

## 2016-07-06 MED ORDER — METHYLPREDNISOLONE SODIUM SUCC 125 MG IJ SOLR
INTRAMUSCULAR | Status: AC
  Administered 2016-07-06: 125 mg
  Filled 2016-07-06: qty 2

## 2016-07-06 MED ORDER — SODIUM CHLORIDE 0.9 % IV SOLN
INTRAVENOUS | Status: DC
  Administered 2016-07-06: 11:00:00 via INTRAVENOUS

## 2016-07-06 MED ORDER — FENTANYL CITRATE (PF) 100 MCG/2ML IJ SOLN
INTRAMUSCULAR | Status: AC
  Filled 2016-07-06: qty 4

## 2016-07-06 MED ORDER — ACETAMINOPHEN 325 MG PO TABS: 325.0000 mg | ORAL_TABLET | ORAL | Status: DC | PRN

## 2016-07-06 MED ORDER — CEFAZOLIN IN D5W 1 GM/50ML IV SOLN
1.0000 g | Freq: Once | INTRAVENOUS | Status: AC
  Administered 2016-07-06: 1 g via INTRAVENOUS

## 2016-07-06 MED ORDER — ACETAMINOPHEN 325 MG RE SUPP
325.0000 mg | RECTAL | Status: DC | PRN
  Filled 2016-07-06: qty 2

## 2016-07-06 MED ORDER — FAMOTIDINE 20 MG PO TABS
40.0000 mg | ORAL_TABLET | ORAL | Status: DC | PRN
  Administered 2016-07-06: 40 mg via ORAL

## 2016-07-06 MED ORDER — MIDAZOLAM HCL 2 MG/2ML IJ SOLN
INTRAMUSCULAR | Status: DC | PRN
  Administered 2016-07-06 (×3): 1 mg via INTRAVENOUS
  Administered 2016-07-06: 0.5 mg via INTRAVENOUS

## 2016-07-06 MED ORDER — HEPARIN SODIUM (PORCINE) 1000 UNIT/ML IJ SOLN
INTRAMUSCULAR | Status: AC
  Filled 2016-07-06: qty 1

## 2016-07-06 MED ORDER — LIDOCAINE-EPINEPHRINE (PF) 2 %-1:200000 IJ SOLN
INTRAMUSCULAR | Status: AC
  Filled 2016-07-06: qty 20

## 2016-07-06 MED ORDER — SODIUM CHLORIDE 0.9 % IV SOLN: INTRAVENOUS | Status: DC

## 2016-07-06 MED ORDER — MIDAZOLAM HCL 2 MG/2ML IJ SOLN
INTRAMUSCULAR | Status: AC
  Filled 2016-07-06: qty 2

## 2016-07-06 MED ORDER — HYDRALAZINE HCL 20 MG/ML IJ SOLN: 5.0000 mg | INTRAMUSCULAR | Status: DC | PRN

## 2016-07-06 SURGICAL SUPPLY — 15 items
BALLN ULTRVRSE 5X22X130 (BALLOONS) ×3
BALLOON ULTRVRSE 5X22X130 (BALLOONS) ×1 IMPLANT
CATH 5FR PIG 90CM (CATHETERS) ×3 IMPLANT
CATH CXI SUPP ST 4FR 135CM (MICROCATHETER) ×3 IMPLANT
CATH VERT 100CM (CATHETERS) ×3 IMPLANT
DEVICE PRESTO INFLATION (MISCELLANEOUS) ×3 IMPLANT
DEVICE STARCLOSE SE CLOSURE (Vascular Products) ×3 IMPLANT
GLIDEWIRE ADV .035X260CM (WIRE) ×6 IMPLANT
GUIDEWIRE PFTE-COATED .018X300 (WIRE) ×3 IMPLANT
PACK ANGIOGRAPHY (CUSTOM PROCEDURE TRAY) ×3 IMPLANT
SHEATH ANL2 6FRX45 HC (SHEATH) ×3 IMPLANT
SHEATH BRITE TIP 5FRX11 (SHEATH) ×3 IMPLANT
SYR MEDRAD MARK V 150ML (SYRINGE) ×3 IMPLANT
TUBING CONTRAST HIGH PRESS 72 (TUBING) ×3 IMPLANT
WIRE J 3MM .035X145CM (WIRE) ×3 IMPLANT

## 2016-07-06 NOTE — Discharge Instructions (Signed)

## 2016-07-06 NOTE — H&P (Signed)
[facility] [facility] History & Physical Update  The patient was interviewed and re-examined.  The patient's previous History and Physical has been reviewed and is unchanged.  There is no change in the plan of care. We plan to proceed with the scheduled procedure.  Leotis Pain, MD  07/06/2016, 10:42 AM

## 2016-07-06 NOTE — Op Note (Signed)
[place] [facility] Percutaneous Study/Intervention Procedural Note   Date of Surgery: 07/06/2016  Surgeon(s):DEW,JASON   Assistants:none  Pre-operative Diagnosis: PAD with ulceration RLE  Post-operative diagnosis: Same  Procedure(s) Performed: 1. Ultrasound guidance for vascular access left femoral artery 2. Catheter placement into right popliteal artery from left femoral approach 3. Aortogram and selective right lower extremity angiogram 4. Percutaneous transluminal angioplasty of right SFA with 5 mm x 22 cm balloon 5.  StarClose closure device left femoral artery  EBL: 15 cc  Contrast: 90 cc  Fluoro Time: 22.7 minutes  Moderate Conscious Sedation Time: approximately 60 minutes using 3.5 mg of Versed and 100 mcg of Fentanyl  Indications: Patient is a 83 y.o.male with PAD and non-healing ulceration on the right foot. The patient has noninvasive study showing poor perfusion to the right foot with nearly flat digital waveforms. The patient is brought in for angiography for further evaluation and potential treatment. Risks and benefits are discussed and informed consent is obtained  Procedure: The patient was identified and appropriate procedural time out was performed. The patient was then placed supine on the table and prepped and draped in the usual sterile fashion.Moderate conscious sedation was administered during a face to face encounter with the patient throughout the procedure with my supervision of the RN administering medicines and monitoring the patient's vital signs, pulse oximetry, telemetry and mental status throughout from the start of the procedure until the patient was taken to the recovery room. Ultrasound was used to evaluate the left common femoral artery. It was patent but calcific. A digital ultrasound image was acquired. A Seldinger needle was used to access the  left common femoral artery under direct ultrasound guidance and a permanent image was performed. A 0.035 J wire was advanced without resistance and a 5Fr sheath was placed. Pigtail catheter was placed into the aorta and an AP aortogram was performed. This demonstrated normal left renal artery, poorly visualized right renal artery and normal aorta and iliac segments without significant stenosis. I then crossed the aortic bifurcation and advanced to the right femoral head. Selective right lower extremity angiogram was then performed. This demonstrated diffuse right SFA disease with a long segment occlusion and then reconstitution in the distal SFA. The popliteal artery was then heavily diseased with another occlusion after reconstitution between Hunter's canal and knee. The below-knee popliteal artery reconstituted a diseased vessel with what appeared to be severe tibial disease and occlusion of all 3 tibial vessels although distal opacification was poor and our visualization throughout the procedure was limited by patient motion and poor perfusion. The patient was systemically heparinized and a 6 [place] Ansell sheath was then placed over the [employer] wire. I then used a Kumpe catheter and the advantage wire to navigate through the initial SFA disease and confirm intraluminal flow at Hunter's canal. There was a large amount of stored energy due to the highly calcific occlusion. Attempts to cross the popliteal artery occlusion were initially taken with both a Kumpe catheter and a CXI catheter and an advantage wire and a 0.018 wire. None of these were successful and a dissection plane was made in the popliteal artery which was difficult to navigate around. We still did not opacify very good tibial vessels on imaging in the popliteal artery. I elected to go ahead and treat superficial femoral artery to try to release some of the stored energy and see if this would help [place] crossed popliteal occlusion as well as  to provide better inflow.  A 5 mm diameter by 22 cm length angioplasty balloon was taken down to Hunter's canal and then used to treat the SFA in its entirety. 2 inflations to 12 atm were required to treat the entire SFA. Completion and gram following this showed an area of greater than 50% residual stenosis at the original occlusion entry point in the proximal to mid SFA but otherwise the SFA was improved. I was able to navigate the catheters a little better, but still despite multiple attempts in over 20 minutes of fluoroscopy and was never able to cross the popliteal occlusion and get into the tibial vessels which were also heavily diseased. With the dissection plane now being the primary channel and not allowing [place] distally, I did not feel there was much further we could do today and we will have to try another day once this dissection plane heals. I elected to terminate the procedure. The sheath was removed and StarClose closure device was deployed in the left femoral artery with excellent hemostatic result. The patient was taken to the recovery room in stable condition having tolerated the procedure well.  Findings:  Aortogram: normal left renal artery, right renal artery not well seen but may have been catheter positioning, normal aorta and iliac arteries. Right Lower Extremity: This demonstrated diffuse right SFA disease with a long segment occlusion and then reconstitution in the distal SFA. The popliteal artery was then heavily diseased with another occlusion after reconstitution between Hunter's canal and knee. The below-knee popliteal artery reconstituted a diseased vessel with what appeared to be severe tibial disease and occlusion of all 3 tibial vessels although distal opacification was poor and our visualization throughout the procedure was limited by patient motion and poor perfusion.   Disposition: Patient was taken to the recovery room in stable condition having  tolerated the procedure well.  Complications: None  Leotis Pain 07/06/2016 1:10 PM   This note was created with Dragon Medical transcription system. Any errors in dictation are purely unintentional.

## 2016-07-07 ENCOUNTER — Encounter: Payer: Self-pay | Admitting: Vascular Surgery

## 2016-07-07 DIAGNOSIS — L97522 Non-pressure chronic ulcer of other part of left foot with fat layer exposed: Secondary | ICD-10-CM | POA: Diagnosis not present

## 2016-07-08 DIAGNOSIS — E11621 Type 2 diabetes mellitus with foot ulcer: Secondary | ICD-10-CM | POA: Diagnosis not present

## 2016-07-08 DIAGNOSIS — Z7984 Long term (current) use of oral hypoglycemic drugs: Secondary | ICD-10-CM | POA: Diagnosis not present

## 2016-07-08 DIAGNOSIS — Z792 Long term (current) use of antibiotics: Secondary | ICD-10-CM | POA: Diagnosis not present

## 2016-07-08 DIAGNOSIS — Z7902 Long term (current) use of antithrombotics/antiplatelets: Secondary | ICD-10-CM | POA: Diagnosis not present

## 2016-07-08 DIAGNOSIS — L97529 Non-pressure chronic ulcer of other part of left foot with unspecified severity: Secondary | ICD-10-CM | POA: Diagnosis not present

## 2016-07-08 DIAGNOSIS — T8781 Dehiscence of amputation stump: Secondary | ICD-10-CM | POA: Diagnosis not present

## 2016-07-08 DIAGNOSIS — Z9181 History of falling: Secondary | ICD-10-CM | POA: Diagnosis not present

## 2016-07-11 DIAGNOSIS — E11621 Type 2 diabetes mellitus with foot ulcer: Secondary | ICD-10-CM | POA: Diagnosis not present

## 2016-07-11 DIAGNOSIS — Z792 Long term (current) use of antibiotics: Secondary | ICD-10-CM | POA: Diagnosis not present

## 2016-07-11 DIAGNOSIS — Z9181 History of falling: Secondary | ICD-10-CM | POA: Diagnosis not present

## 2016-07-11 DIAGNOSIS — L97529 Non-pressure chronic ulcer of other part of left foot with unspecified severity: Secondary | ICD-10-CM | POA: Diagnosis not present

## 2016-07-11 DIAGNOSIS — Z7984 Long term (current) use of oral hypoglycemic drugs: Secondary | ICD-10-CM | POA: Diagnosis not present

## 2016-07-11 DIAGNOSIS — Z7902 Long term (current) use of antithrombotics/antiplatelets: Secondary | ICD-10-CM | POA: Diagnosis not present

## 2016-07-11 DIAGNOSIS — T8781 Dehiscence of amputation stump: Secondary | ICD-10-CM | POA: Diagnosis not present

## 2016-07-12 ENCOUNTER — Encounter (INDEPENDENT_AMBULATORY_CARE_PROVIDER_SITE_OTHER): Payer: Self-pay

## 2016-07-12 DIAGNOSIS — L97529 Non-pressure chronic ulcer of other part of left foot with unspecified severity: Secondary | ICD-10-CM | POA: Diagnosis not present

## 2016-07-12 DIAGNOSIS — Z792 Long term (current) use of antibiotics: Secondary | ICD-10-CM | POA: Diagnosis not present

## 2016-07-12 DIAGNOSIS — Z9181 History of falling: Secondary | ICD-10-CM | POA: Diagnosis not present

## 2016-07-12 DIAGNOSIS — E11621 Type 2 diabetes mellitus with foot ulcer: Secondary | ICD-10-CM | POA: Diagnosis not present

## 2016-07-12 DIAGNOSIS — Z7902 Long term (current) use of antithrombotics/antiplatelets: Secondary | ICD-10-CM | POA: Diagnosis not present

## 2016-07-12 DIAGNOSIS — Z7984 Long term (current) use of oral hypoglycemic drugs: Secondary | ICD-10-CM | POA: Diagnosis not present

## 2016-07-12 DIAGNOSIS — T8781 Dehiscence of amputation stump: Secondary | ICD-10-CM | POA: Diagnosis not present

## 2016-07-13 ENCOUNTER — Other Ambulatory Visit (INDEPENDENT_AMBULATORY_CARE_PROVIDER_SITE_OTHER): Payer: Self-pay | Admitting: Vascular Surgery

## 2016-07-15 ENCOUNTER — Telehealth (INDEPENDENT_AMBULATORY_CARE_PROVIDER_SITE_OTHER): Payer: Self-pay

## 2016-07-15 ENCOUNTER — Inpatient Hospital Stay: Admission: RE | Admit: 2016-07-15 | Payer: Medicare HMO | Source: Ambulatory Visit

## 2016-07-15 ENCOUNTER — Encounter: Payer: Self-pay | Admitting: Emergency Medicine

## 2016-07-15 ENCOUNTER — Emergency Department
Admission: EM | Admit: 2016-07-15 | Discharge: 2016-07-15 | Disposition: A | Discharge status: Home / Self Care | Payer: Medicare HMO | Attending: Emergency Medicine | Admitting: Emergency Medicine

## 2016-07-15 ENCOUNTER — Emergency Department: Payer: Medicare HMO

## 2016-07-15 DIAGNOSIS — I10 Essential (primary) hypertension: Secondary | ICD-10-CM | POA: Insufficient documentation

## 2016-07-15 DIAGNOSIS — Z79899 Other long term (current) drug therapy: Secondary | ICD-10-CM | POA: Insufficient documentation

## 2016-07-15 DIAGNOSIS — Z87891 Personal history of nicotine dependence: Secondary | ICD-10-CM | POA: Diagnosis not present

## 2016-07-15 DIAGNOSIS — Z791 Long term (current) use of non-steroidal anti-inflammatories (NSAID): Secondary | ICD-10-CM | POA: Insufficient documentation

## 2016-07-15 DIAGNOSIS — I739 Peripheral vascular disease, unspecified: Secondary | ICD-10-CM | POA: Diagnosis not present

## 2016-07-15 DIAGNOSIS — Z7984 Long term (current) use of oral hypoglycemic drugs: Secondary | ICD-10-CM | POA: Insufficient documentation

## 2016-07-15 DIAGNOSIS — I252 Old myocardial infarction: Secondary | ICD-10-CM | POA: Insufficient documentation

## 2016-07-15 DIAGNOSIS — I2510 Atherosclerotic heart disease of native coronary artery without angina pectoris: Secondary | ICD-10-CM | POA: Insufficient documentation

## 2016-07-15 DIAGNOSIS — E119 Type 2 diabetes mellitus without complications: Secondary | ICD-10-CM | POA: Insufficient documentation

## 2016-07-15 DIAGNOSIS — M79604 Pain in right leg: Secondary | ICD-10-CM | POA: Diagnosis not present

## 2016-07-15 DIAGNOSIS — M79662 Pain in left lower leg: Secondary | ICD-10-CM | POA: Diagnosis not present

## 2016-07-15 DIAGNOSIS — M79606 Pain in leg, unspecified: Secondary | ICD-10-CM

## 2016-07-15 NOTE — ED Notes (Signed)
Pt states right calf pain after his angiogram last week, states "they could not finish the test or get past my knee so I have to go back next week", no swelling noted to right leg, cap refill < 3 secs, warm and dry, great toe wrapped from "wound" per wife

## 2016-07-15 NOTE — Discharge Instructions (Signed)
Please keep your follow-up appointment with Dr. Lucky Cowboy this Monday as scheduled. Return to the emergency department for any concerns such as worsening pain, fevers, chills, or for any other concerns.  It was a pleasure to take care of you today, and thank you for coming to our emergency department.  If you have any questions or concerns before leaving please ask the nurse to grab me and I'm more than happy to go through your aftercare instructions again.  If you were prescribed any opioid pain medication today such as Norco, Vicodin, Percocet, morphine, hydrocodone, or oxycodone please make sure you do not drive when you are taking this medication as it can alter your ability to drive safely.  If you have any concerns once you are home that you are not improving or are in fact getting worse before you can make it to your follow-up appointment, please do not hesitate to call 911 and come back for further evaluation.  Darel Hong MD  Results for orders placed or performed during the hospital encounter of 07/05/16  BUN  Result Value Ref Range   BUN 20 6 - 20 mg/dL  Creatinine, serum  Result Value Ref Range   Creatinine, Ser 1.20 0.61 - 1.24 mg/dL   GFR calc non Af Amer 53 (L) >60 mL/min   GFR calc Af Amer >60 >60 mL/min   US Venous Img Lower Unilateral Right  Result Date: 07/15/2016 CLINICAL DATA:  Leg pain following recent arteriography EXAM: RIGHT LOWER EXTREMITY VENOUS DOPPLER ULTRASOUND TECHNIQUE: Gray-scale sonography with graded compression, as well as color Doppler and duplex ultrasound were performed to evaluate the lower extremity deep venous systems from the level of the common femoral vein and including the common femoral, femoral, profunda femoral, popliteal and calf veins including the posterior tibial, peroneal and gastrocnemius veins when visible. The superficial great saphenous vein was also interrogated. Spectral Doppler was utilized to evaluate flow at rest and with distal  augmentation maneuvers in the common femoral, femoral and popliteal veins. COMPARISON:  None. FINDINGS: Contralateral Common Femoral Vein: Respiratory phasicity is normal and symmetric with the symptomatic side. No evidence of thrombus. Normal compressibility. Common Femoral Vein: No evidence of thrombus. Normal compressibility, respiratory phasicity and response to augmentation. Saphenofemoral Junction: No evidence of thrombus. Normal compressibility and flow on color Doppler imaging. Profunda Femoral Vein: No evidence of thrombus. Normal compressibility and flow on color Doppler imaging. Femoral Vein: No evidence of thrombus. Normal compressibility, respiratory phasicity and response to augmentation. Popliteal Vein: No evidence of thrombus. Normal compressibility, respiratory phasicity and response to augmentation. Calf Veins: No evidence of thrombus. Normal compressibility and flow on color Doppler imaging. Superficial Great Saphenous Vein: No evidence of thrombus. Normal compressibility and flow on color Doppler imaging. Venous Reflux:  None. Other Findings:  None. IMPRESSION: No evidence of deep venous thrombosis. Electronically Signed   By: Inez Catalina M.D.   On: 07/15/2016 14:15   [place] Lower Ext Art Left Ltd  Result Date: 07/15/2016 CLINICAL DATA:  83 year old male with a history of prior angiogram 07/06/2016. Leg pain. EXAM: UNILATERAL LEFT LOWER EXTREMITY ARTERIAL DUPLEX SCAN TECHNIQUE: Gray-scale sonography as well as color Doppler and duplex ultrasound was performed to evaluate left common femoral artery. COMPARISON:  CT 05/26/2016, 05/25/2016 FINDINGS: Sonographic survey of the vasculature of the left common femoral region demonstrates no focal fluid collection. No evidence of pseudoaneurysm. Common femoral artery and common femoral vein remain patent. Atherosclerotic changes. IMPRESSION: Sonographic survey of the left femoral region demonstrates no  evidence of pseudoaneurysm or focal fluid.  Electronically Signed   By: Corrie Mckusick D.O.   On: 07/15/2016 14:25

## 2016-07-15 NOTE — ED Provider Notes (Signed)
[facility] Emergency Department Provider Note  ____________________________________________   First MD Initiated Contact with Patient 07/15/16 1456     (approximate)  I have reviewed the triage vital signs and the nursing notes.   HISTORY  Chief Complaint Leg Pain    HPI MACALLISTER ASHMEAD is a 83 y.o. male who comes to the emergency department with 9 days of worsening exertional moderate severity aching in his right groin and right leg. He has a long-standing history of peripheral artery disease and 9 days ago had an angiogram to evaluate his right leg. Ever since then he has noted increasing pain with exertion. Pain resolves with rest and when hanging his leg down. He has an appointment this coming Monday to see Dr. due for repeat procedure. He's had no fevers or chills. No chest pain or shortness of breath. No abdominal pain nausea or vomiting.   Past Medical History:  Diagnosis Date  . Anemia   . Aortic valve disorder   . Coronary artery disease   . Diabetes mellitus without complication ([place])   . Glaucoma   . History of BPH   . History of kidney stones   . HOH (hard of hearing)    Bilateral Hearing Aids  . Hyperlipidemia   . Hypertension   . Kidney stones   . MRSA (methicillin resistant staph aureus) culture positive 04/01/2016   LEFT FOOT  . Myocardial infarction   . Neuropathy ([place])   . Peripheral vascular disease [facility])     Patient Active Problem List   Diagnosis Date Noted  . Pressure injury of skin 05/27/2016  . Elevated lactic acid level 05/26/2016  . Weakness 05/26/2016  . PAD (peripheral artery disease) ([place]) 03/24/2016  . Foot ulceration, left, with necrosis of muscle ([place]) 03/24/2016  . Essential hypertension 02/02/2016  . Diabetes ([place]) 02/02/2016  . Atherosclerotic peripheral vascular disease with ulceration ([place]) 02/02/2016  . Valvular heart disease 02/02/2016  . Embolic stroke involving right cerebellar artery ([place])  11/10/2014  . Hypoglycemia 11/05/2014    Past Surgical History:  Procedure Laterality Date  . AMPUTATION TOE Left 02/11/2016   Procedure: AMPUTATION TOE;  Surgeon: Albertine Patricia, DPM;  Location: [facility] ORS;  Service: Podiatry;  Laterality: Left;  . APPLICATION OF WOUND VAC Left 04/01/2016   Procedure: APPLICATION OF WOUND VAC;  Surgeon: Albertine Patricia, DPM;  Location: [facility] ORS;  Service: Podiatry;  Laterality: Left;  . CARDIAC CATHETERIZATION    . CARDIAC SURGERY    . CARDIAC VALVE REPLACEMENT    . CORONARY ARTERY BYPASS GRAFT    . IRRIGATION AND DEBRIDEMENT FOOT Left 04/01/2016   Procedure: IRRIGATION AND DEBRIDEMENT FOOT;  Surgeon: Albertine Patricia, DPM;  Location: [facility] ORS;  Service: Podiatry;  Laterality: Left;  . IRRIGATION AND DEBRIDEMENT FOOT Left 05/13/2016   Procedure: IRRIGATION AND DEBRIDEMENT FOOT;  Surgeon: Albertine Patricia, DPM;  Location: [facility] ORS;  Service: Podiatry;  Laterality: Left;  . LOWER EXTREMITY ANGIOGRAPHY Right 07/06/2016   Procedure: Lower Extremity Angiography;  Surgeon: Algernon Huxley, MD;  Location: [place] CV LAB;  Service: Cardiovascular;  Laterality: Right;  . OSTECTOMY Left 05/13/2016   Procedure: OSTECTOMY;  Surgeon: Albertine Patricia, DPM;  Location: [facility] ORS;  Service: Podiatry;  Laterality: Left;  . PERIPHERAL VASCULAR CATHETERIZATION Left 02/03/2016   Procedure: Lower Extremity Angiography;  Surgeon: Algernon Huxley, MD;  Location: [place] [facility];  Service: Cardiovascular;  Laterality: Left;  . PERIPHERAL VASCULAR CATHETERIZATION Left 04/01/2016   Procedure: Lower Extremity  Angiography;  Surgeon: Katha Cabal, MD;  Location: [place] [facility];  Service: Cardiovascular;  Laterality: Left;  Marland Kitchen VALVE REPLACEMENT  2007   Aortic Valve Replacement, [employer] Jude Porcine Valve    Prior to Admission medications   Medication Sig Start Date End Date Taking? Authorizing Provider  amLODipine (NORVASC) 10 MG tablet Take 1 tablet (10 mg total) by mouth at  bedtime. 05/30/16   Demetrios Loll, MD  amLODipine (NORVASC) 5 MG tablet Take 5 mg by mouth 2 (two) times daily.    Historical Provider, MD  clopidogrel (PLAVIX) 75 MG tablet TAKE 1 TABLET BY MOUTH EVERY DAY 05/24/16   Algernon Huxley, MD  finasteride (PROSCAR) 5 MG tablet Take 5 mg by mouth at bedtime.     Historical Provider, MD  glipiZIDE (GLUCOTROL XL) 5 MG 24 hr tablet Take 5 mg by mouth daily. 09/06/15 09/05/16  Historical Provider, MD  HYDROcodone-acetaminophen (NORCO/VICODIN) 5-325 MG tablet Take 1 tablet by mouth every 6 (six) hours as needed for moderate pain.  05/13/16   Historical Provider, MD  ibuprofen (ADVIL,MOTRIN) 800 MG tablet Take 800 mg by mouth every evening.    Historical Provider, MD  latanoprost (XALATAN) 0.005 % ophthalmic solution Place 1 drop into both eyes at bedtime.    Historical Provider, MD  lovastatin (MEVACOR) 40 MG tablet Take 40 mg by mouth daily.     Historical Provider, MD  meclizine (ANTIVERT) 12.5 MG tablet Take 12.5 mg by mouth 4 (four) times daily as needed for dizziness.  02/21/16   Historical Provider, MD  metFORMIN (GLUCOPHAGE) 1000 MG tablet Take 1,000 mg by mouth 2 (two) times daily with a meal.    Historical Provider, MD  neomycin-bacitracin-polymyxin (NEOSPORIN) ointment Apply 1 application topically daily as needed for wound care.     Historical Provider, MD  sodium chloride (OCEAN) 0.65 % SOLN nasal spray Place 1 spray into both nostrils as needed for congestion.    Historical Provider, MD  tamsulosin (FLOMAX) 0.4 MG CAPS capsule Take 1 capsule (0.4 mg total) by mouth daily. 05/28/16   Demetrios Loll, MD    Allergies Shellfish allergy  Family History  Problem Relation Age of Onset  . Heart attack Mother   . Alcohol abuse Father   . Throat cancer Sister   . Heart attack Brother   . Heart failure Neg Hx     Social History Social History  Substance Use Topics  . Smoking status: Former Smoker    Packs/day: 1.00    Types: Cigarettes  . Smokeless tobacco:  Current User    Types: Chew  . Alcohol use No    Review of Systems Constitutional: No fever/chills Eyes: No visual changes. ENT: No sore throat. Cardiovascular: Denies chest pain. Respiratory: Denies shortness of breath. Gastrointestinal: No abdominal pain.  No nausea, no vomiting.  No diarrhea.  No constipation. Genitourinary: Negative for dysuria. Musculoskeletal: Negative for back pain. Skin: Negative for rash. Neurological: Negative for headaches, focal weakness or numbness.  10-point ROS otherwise negative.  ____________________________________________   PHYSICAL EXAM:  VITAL SIGNS: ED Triage Vitals  Enc Vitals Group     BP 07/15/16 1226 127/65     Pulse Rate 07/15/16 1226 100     Resp 07/15/16 1226 18     Temp 07/15/16 1226 98.5 F (36.9 C)     Temp Source 07/15/16 1226 Oral     SpO2 07/15/16 1226 99 %     Weight 07/15/16 1226 196 lb (88.9 kg)  Height --      Head Circumference --      Peak Flow --      Pain Score 07/15/16 1225 5     Pain Loc --      Pain Edu? --      Excl. in [place]? --     Constitutional: Alert and oriented x 4 well appearing nontoxic no diaphoresis speaks in full, clear sentences Eyes: PERRL EOMI. Head: Atraumatic. Nose: No congestion/rhinnorhea. Mouth/Throat: No trismus Neck: No stridor.   Cardiovascular: Normal rate, regular rhythm. Grossly normal heart sounds.  Good peripheral circulation. Respiratory: Normal respiratory effort.  No retractions. Lungs CTAB and moving good air Gastrointestinal: Soft nondistended nontender no rebound no guarding no peritonitis no McBurney's tenderness negative Rovsing's no costovertebral tenderness negative Murphy's Musculoskeletal: Nonpalpable DP or PT on the right but biphasic DP and PT. Capillary refill less than 2 seconds and skin is warm and dry. His great toe on the right does have dry gangrene Neurologic:  Normal speech and language. No gross focal neurologic deficits are appreciated. Skin:  Skin  is warm, dry and intact. No rash noted. Psychiatric: Mood and affect are normal. Speech and behavior are normal.    ____________________________________________   DIFFERENTIAL  Claudication, critical limb ischemia, infection ____________________________________________   LABS (all labs ordered are listed, but only abnormal results are displayed)  Labs Reviewed - No data to display   __________________________________________  EKG   ____________________________________________  RADIOLOGY  No acute disease on ultrasound ____________________________________________   PROCEDURES  Procedure(s) performed: no  Procedures  Critical Care performed: no  ____________________________________________   INITIAL IMPRESSION / ASSESSMENT AND PLAN / ED COURSE  Pertinent labs & imaging results that were available during my care of the patient were reviewed by me and considered in my medical decision making (see chart for details).  Fortunately the patient has good blood flow to his right leg and no signs of critical limb ischemia. He already has follow-up with his vascular surgeon on Monday which is in 3 days and I believe she is stable at that point. Strict return to the emergency department precautions given. He is stable for outpatient management understands and agrees with the plan.      ____________________________________________   FINAL CLINICAL IMPRESSION(S) / ED DIAGNOSES  Final diagnoses:  Leg pain      NEW MEDICATIONS STARTED DURING THIS VISIT:  New Prescriptions   No medications on file     Note:  This document was prepared using Dragon voice recognition software and may include unintentional dictation errors.     Darel Hong, MD 07/15/16 1525

## 2016-07-15 NOTE — ED Notes (Signed)
EDP at bedside  

## 2016-07-15 NOTE — Telephone Encounter (Signed)
Patient's daughter called stating that the patient had trouble walking after his procedure on 07/06/16, she stated he had soreness. She stated that today he walked a short distance but he still had soreness and some yellowing of his leg as well as his other leg being sore. She was questioning having his other procedure on Monday 07/18/16. Per Dr. Lucky Cowboy some soreness, bruising and pain is normal. But if she was very concerned regarding all this to have him go to the ED and be evaluated.

## 2016-07-15 NOTE — ED Triage Notes (Signed)
Pt to ed with c/o right calf pain since having angiogram Wednesday 1 week ago.  Pt reports pain is much worse with standing and walking.

## 2016-07-17 MED ORDER — FAMOTIDINE 20 MG PO TABS: 40.0000 mg | ORAL_TABLET | ORAL | Status: DC | PRN

## 2016-07-17 MED ORDER — CEFAZOLIN IN D5W 1 GM/50ML IV SOLN
1.0000 g | Freq: Once | INTRAVENOUS | Status: AC
  Administered 2016-07-18: 1 g via INTRAVENOUS

## 2016-07-17 MED ORDER — METHYLPREDNISOLONE SODIUM SUCC 125 MG IJ SOLR: 125.0000 mg | INTRAMUSCULAR | Status: DC | PRN

## 2016-07-18 ENCOUNTER — Other Ambulatory Visit: Payer: Medicare HMO

## 2016-07-18 ENCOUNTER — Encounter
Admission: RE | Disposition: A | Discharge status: Home / Self Care | Payer: Self-pay | Source: Ambulatory Visit | Attending: Vascular Surgery

## 2016-07-18 ENCOUNTER — Ambulatory Visit
Admission: RE | Admit: 2016-07-18 | Discharge: 2016-07-18 | Disposition: A | Discharge status: Home / Self Care | Payer: Medicare HMO | Source: Ambulatory Visit | Attending: Vascular Surgery | Admitting: Vascular Surgery

## 2016-07-18 DIAGNOSIS — I70235 Atherosclerosis of native arteries of right leg with ulceration of other part of foot: Secondary | ICD-10-CM | POA: Diagnosis not present

## 2016-07-18 DIAGNOSIS — Z87442 Personal history of urinary calculi: Secondary | ICD-10-CM | POA: Insufficient documentation

## 2016-07-18 DIAGNOSIS — Z9889 Other specified postprocedural states: Secondary | ICD-10-CM | POA: Diagnosis not present

## 2016-07-18 DIAGNOSIS — Z87891 Personal history of nicotine dependence: Secondary | ICD-10-CM | POA: Insufficient documentation

## 2016-07-18 DIAGNOSIS — Z7984 Long term (current) use of oral hypoglycemic drugs: Secondary | ICD-10-CM | POA: Insufficient documentation

## 2016-07-18 DIAGNOSIS — I10 Essential (primary) hypertension: Secondary | ICD-10-CM | POA: Diagnosis not present

## 2016-07-18 DIAGNOSIS — H903 Sensorineural hearing loss, bilateral: Secondary | ICD-10-CM | POA: Insufficient documentation

## 2016-07-18 DIAGNOSIS — Z800 Family history of malignant neoplasm of digestive organs: Secondary | ICD-10-CM | POA: Insufficient documentation

## 2016-07-18 DIAGNOSIS — E1140 Type 2 diabetes mellitus with diabetic neuropathy, unspecified: Secondary | ICD-10-CM | POA: Diagnosis not present

## 2016-07-18 DIAGNOSIS — D649 Anemia, unspecified: Secondary | ICD-10-CM | POA: Diagnosis not present

## 2016-07-18 DIAGNOSIS — Z8249 Family history of ischemic heart disease and other diseases of the circulatory system: Secondary | ICD-10-CM | POA: Insufficient documentation

## 2016-07-18 DIAGNOSIS — E785 Hyperlipidemia, unspecified: Secondary | ICD-10-CM | POA: Insufficient documentation

## 2016-07-18 DIAGNOSIS — Z89422 Acquired absence of other left toe(s): Secondary | ICD-10-CM | POA: Diagnosis not present

## 2016-07-18 DIAGNOSIS — I252 Old myocardial infarction: Secondary | ICD-10-CM | POA: Diagnosis not present

## 2016-07-18 DIAGNOSIS — H409 Unspecified glaucoma: Secondary | ICD-10-CM | POA: Diagnosis not present

## 2016-07-18 DIAGNOSIS — Z811 Family history of alcohol abuse and dependence: Secondary | ICD-10-CM | POA: Insufficient documentation

## 2016-07-18 DIAGNOSIS — E11621 Type 2 diabetes mellitus with foot ulcer: Secondary | ICD-10-CM | POA: Diagnosis not present

## 2016-07-18 DIAGNOSIS — Z91013 Allergy to seafood: Secondary | ICD-10-CM | POA: Insufficient documentation

## 2016-07-18 DIAGNOSIS — L988 Other specified disorders of the skin and subcutaneous tissue: Secondary | ICD-10-CM | POA: Diagnosis not present

## 2016-07-18 DIAGNOSIS — Z7902 Long term (current) use of antithrombotics/antiplatelets: Secondary | ICD-10-CM | POA: Diagnosis not present

## 2016-07-18 DIAGNOSIS — Z952 Presence of prosthetic heart valve: Secondary | ICD-10-CM | POA: Insufficient documentation

## 2016-07-18 DIAGNOSIS — I70238 Atherosclerosis of native arteries of right leg with ulceration of other part of lower right leg: Secondary | ICD-10-CM | POA: Diagnosis not present

## 2016-07-18 DIAGNOSIS — T8781 Dehiscence of amputation stump: Secondary | ICD-10-CM | POA: Diagnosis not present

## 2016-07-18 DIAGNOSIS — L97519 Non-pressure chronic ulcer of other part of right foot with unspecified severity: Secondary | ICD-10-CM | POA: Diagnosis not present

## 2016-07-18 DIAGNOSIS — Z8614 Personal history of Methicillin resistant Staphylococcus aureus infection: Secondary | ICD-10-CM | POA: Diagnosis not present

## 2016-07-18 DIAGNOSIS — Z951 Presence of aortocoronary bypass graft: Secondary | ICD-10-CM | POA: Insufficient documentation

## 2016-07-18 DIAGNOSIS — I2510 Atherosclerotic heart disease of native coronary artery without angina pectoris: Secondary | ICD-10-CM | POA: Diagnosis not present

## 2016-07-18 DIAGNOSIS — L97529 Non-pressure chronic ulcer of other part of left foot with unspecified severity: Secondary | ICD-10-CM | POA: Diagnosis not present

## 2016-07-18 DIAGNOSIS — E1151 Type 2 diabetes mellitus with diabetic peripheral angiopathy without gangrene: Secondary | ICD-10-CM | POA: Diagnosis not present

## 2016-07-18 HISTORY — PX: LOWER EXTREMITY ANGIOGRAPHY: CATH118251

## 2016-07-18 LAB — CREATININE, SERUM
CREATININE: 1.28 mg/dL — AB (ref 0.61–1.24)
GFR calc non Af Amer: 49 mL/min — ABNORMAL LOW (ref >60)
GFR, EST AFRICAN AMERICAN: 56 mL/min — AB (ref >60)

## 2016-07-18 LAB — BUN: BUN: 27 mg/dL — ABNORMAL HIGH (ref 6–20)

## 2016-07-18 SURGERY — LOWER EXTREMITY ANGIOGRAPHY
Anesthesia: Moderate Sedation | Laterality: Right

## 2016-07-18 MED ORDER — SODIUM CHLORIDE 0.9 % IV SOLN: INTRAVENOUS | Status: AC

## 2016-07-18 MED ORDER — MIDAZOLAM HCL 2 MG/2ML IJ SOLN
INTRAMUSCULAR | Status: AC
  Filled 2016-07-18: qty 2

## 2016-07-18 MED ORDER — HYDRALAZINE HCL 20 MG/ML IJ SOLN: 5.0000 mg | INTRAMUSCULAR | Status: DC | PRN

## 2016-07-18 MED ORDER — LIDOCAINE-EPINEPHRINE (PF) 2 %-1:200000 IJ SOLN
INTRAMUSCULAR | Status: AC
  Filled 2016-07-18: qty 20

## 2016-07-18 MED ORDER — PHENOL 1.4 % MT LIQD: 1.0000 | OROMUCOSAL | Status: DC | PRN

## 2016-07-18 MED ORDER — HEPARIN SODIUM (PORCINE) 1000 UNIT/ML IJ SOLN
INTRAMUSCULAR | Status: DC | PRN
  Administered 2016-07-18: 4000 [IU] via INTRAVENOUS

## 2016-07-18 MED ORDER — SODIUM CHLORIDE 0.9 % IV SOLN
INTRAVENOUS | Status: DC
Start: 2016-07-18 — End: 2016-07-18
  Administered 2016-07-18: 09:00:00 via INTRAVENOUS

## 2016-07-18 MED ORDER — GUAIFENESIN-DM 100-10 MG/5ML PO SYRP: 15.0000 mL | ORAL_SOLUTION | ORAL | Status: DC | PRN

## 2016-07-18 MED ORDER — FENTANYL CITRATE (PF) 100 MCG/2ML IJ SOLN
INTRAMUSCULAR | Status: AC
  Filled 2016-07-18: qty 2

## 2016-07-18 MED ORDER — ACETAMINOPHEN 325 MG PO TABS: 325.0000 mg | ORAL_TABLET | ORAL | Status: DC | PRN

## 2016-07-18 MED ORDER — METOPROLOL TARTRATE 5 MG/5ML IV SOLN: 2.0000 mg | INTRAVENOUS | Status: DC | PRN

## 2016-07-18 MED ORDER — CEFAZOLIN IN D5W 1 GM/50ML IV SOLN
INTRAVENOUS | Status: AC
  Filled 2016-07-18: qty 50

## 2016-07-18 MED ORDER — OXYCODONE-ACETAMINOPHEN 5-325 MG PO TABS: 1.0000 | ORAL_TABLET | ORAL | Status: DC | PRN

## 2016-07-18 MED ORDER — LABETALOL HCL 5 MG/ML IV SOLN: 10.0000 mg | INTRAVENOUS | Status: DC | PRN

## 2016-07-18 MED ORDER — ONDANSETRON HCL 4 MG/2ML IJ SOLN: 4.0000 mg | Freq: Four times a day (QID) | INTRAMUSCULAR | Status: DC | PRN

## 2016-07-18 MED ORDER — ACETAMINOPHEN 325 MG RE SUPP: 325.0000 mg | RECTAL | Status: DC | PRN

## 2016-07-18 MED ORDER — FENTANYL CITRATE (PF) 100 MCG/2ML IJ SOLN
INTRAMUSCULAR | Status: DC | PRN
  Administered 2016-07-18 (×4): 50 ug via INTRAVENOUS

## 2016-07-18 MED ORDER — HYDROMORPHONE HCL 1 MG/ML IJ SOLN: 0.5000 mg | INTRAMUSCULAR | Status: DC | PRN

## 2016-07-18 MED ORDER — MIDAZOLAM HCL 2 MG/2ML IJ SOLN
INTRAMUSCULAR | Status: DC | PRN
  Administered 2016-07-18 (×3): 2 mg via INTRAVENOUS

## 2016-07-18 MED ORDER — DIPHENHYDRAMINE HCL 50 MG/ML IJ SOLN
INTRAMUSCULAR | Status: AC
  Filled 2016-07-18: qty 1

## 2016-07-18 MED ORDER — HEPARIN (PORCINE) IN NACL 2-0.9 UNIT/ML-% IJ SOLN
INTRAMUSCULAR | Status: AC
  Filled 2016-07-18: qty 1000

## 2016-07-18 MED ORDER — IOPAMIDOL (ISOVUE-300) INJECTION 61%
INTRAVENOUS | Status: DC | PRN
  Administered 2016-07-18: 90 mL via INTRAVENOUS

## 2016-07-18 MED ORDER — SODIUM CHLORIDE 0.9 % IV SOLN: 500.0000 mL | Freq: Once | INTRAVENOUS | Status: DC | PRN

## 2016-07-18 MED ORDER — HEPARIN SODIUM (PORCINE) 1000 UNIT/ML IJ SOLN
INTRAMUSCULAR | Status: AC
  Filled 2016-07-18: qty 1

## 2016-07-18 MED ORDER — METHYLPREDNISOLONE SODIUM SUCC 125 MG IJ SOLR
INTRAMUSCULAR | Status: AC
  Filled 2016-07-18: qty 2

## 2016-07-18 MED ORDER — HYDROMORPHONE HCL 1 MG/ML IJ SOLN: 1.0000 mg | Freq: Once | INTRAMUSCULAR | Status: DC | PRN

## 2016-07-18 MED ORDER — MIDAZOLAM HCL 2 MG/2ML IJ SOLN
INTRAMUSCULAR | Status: AC
  Filled 2016-07-18: qty 4

## 2016-07-18 MED ORDER — FAMOTIDINE 20 MG PO TABS
ORAL_TABLET | ORAL | Status: AC
  Filled 2016-07-18: qty 2

## 2016-07-18 SURGICAL SUPPLY — 28 items
BALLN ARMADA 3.0X120X150 (BALLOONS) ×2
BALLN ARMADA 3X120X150 (BALLOONS) ×1
BALLN ARMADA 4.0X80X150 (BALLOONS) ×3
BALLN LUTONIX 5X150X130 (BALLOONS) ×3
BALLN LUTONIX DCB 4X80X130 (BALLOONS) ×3
BALLN ULTRVRSE 018 2.5X100X150 (BALLOONS) ×3
BALLN ULTRVRSE 4X80X150 (BALLOONS) ×2
BALLN ULTRVRSE 4X80X150 OTW (BALLOONS) ×1
BALLOON ARMADA 3X120X150 (BALLOONS) ×1 IMPLANT
BALLOON ARMADA 4.0X80X150 (BALLOONS) ×1 IMPLANT
BALLOON LUTONIX 5X150X130 (BALLOONS) ×1 IMPLANT
BALLOON LUTONIX DCB 4X80X130 (BALLOONS) ×1 IMPLANT
BALLOON ULTRVRSE 4X80X150 OTW (BALLOONS) ×1 IMPLANT
BALLOON ULTRVS 018 2.5X100X150 (BALLOONS) ×1 IMPLANT
CATH CXI SUPP ANG 4FR 135 (MICROCATHETER) ×1 IMPLANT
CATH CXI SUPP ANG 4FR 135CM (MICROCATHETER) ×3
CATH RIM 65CM (CATHETERS) ×3 IMPLANT
CATH VERT 100CM (CATHETERS) ×3 IMPLANT
DEVICE PRESTO INFLATION (MISCELLANEOUS) ×3 IMPLANT
DEVICE STARCLOSE SE CLOSURE (Vascular Products) ×3 IMPLANT
DEVICE TORQUE (MISCELLANEOUS) ×3 IMPLANT
GLIDEWIRE ADV .035X260CM (WIRE) ×3 IMPLANT
GUIDEWIRE PFTE-COATED .018X300 (WIRE) ×3 IMPLANT
PACK ANGIOGRAPHY (CUSTOM PROCEDURE TRAY) ×3 IMPLANT
SHEATH ANL2 6FRX45 HC (SHEATH) ×3 IMPLANT
SHEATH BRITE TIP 5FRX11 (SHEATH) ×3 IMPLANT
STENT VIABAHN 6X150X120 (Permanent Stent) ×3 IMPLANT
TOWEL OR 17X26 4PK STRL BLUE (TOWEL DISPOSABLE) ×3 IMPLANT

## 2016-07-18 NOTE — H&P (Signed)
 [facility] History & Physical Update  The patient was interviewed and re-examined.  The patient's previous History and Physical has been reviewed and is unchanged.  There is no change in the plan of care. We plan to proceed with the scheduled procedure.  Leotis Pain, MD  07/18/2016, 8:27 AM

## 2016-07-18 NOTE — Op Note (Signed)
[place] [facility] Percutaneous Study/Intervention Procedural Note   Date of Surgery: 07/18/2016  Surgeon(s):Anely Spiewak   Assistants:none  Pre-operative Diagnosis: PAD with ulceration right lower extremity  Post-operative diagnosis: Same  Procedure(s) Performed: 1. Ultrasound guidance for vascular access left femoral artery 2. Catheter placement into right peroneal artery and right anterior tibial artery from left femoral approach 3. Selective right lower extremity angiogram including selective images of the right anterior tibial and right peroneal arteries 4. Percutaneous transluminal angioplasty of right tibioperoneal trunk and peroneal artery with 3 mm diameter angioplasty balloon and then percutaneous transluminal angioplasty of the tibioperoneal trunk and distal popliteal artery with 4 mm diameter angioplasty balloon 5. Percutaneous transluminal angioplasty of the right distal SFA and above-knee popliteal artery with 5 mm diameter by 15 cm length Lutonix drug-coated angioplasty balloon  6.  Percutaneous transluminal angioplasty of the right proximal anterior tibial artery with 2.5 mm diameter by 10 cm length angioplasty balloon  7.  Viabahn stent placement to the right above-knee popliteal artery and distal SFA with 6 mm diameter by 15 cm length stent for greater than 50% residual stenosis after angioplasty 8. StarClose closure device left femoral artery  EBL: 10 cc  Contrast: 90 cc  Fluoro Time: 24.9 minutes  Moderate Conscious Sedation Time: approximately 80 minutes using 6 mg of Versed and 200 mcg of Fentanyl  Indications: Patient is a 83 y.o.male with nonhealing ulcerations of the right lower extremity and severe peripheral arterial disease. The patient has already had intervention to the proximal SFA been on the previous attempts we were unable to cross the popliteal  artery occlusion. The patient is brought in for angiography for further evaluation and potential treatment. Risks and benefits are discussed and informed consent is obtained  Procedure: The patient was identified and appropriate procedural time out was performed. The patient was then placed supine on the table and prepped and draped in the usual sterile fashion.Moderate conscious sedation was administered during a face to face encounter with the patient throughout the procedure with my supervision of the RN administering medicines and monitoring the patient's vital signs, pulse oximetry, telemetry and mental status throughout from the start of the procedure until the patient was taken to the recovery room. Ultrasound was used to evaluate the left common femoral artery. It was patent . A digital ultrasound image was acquired. A Seldinger needle was used to access the left common femoral artery under direct ultrasound guidance and a permanent image was performed. A 0.035 J wire was advanced without resistance and a 5Fr sheath was placed.Aortogram was not necessary as one had been previously performed recently and did not require intervention. I then crossed the aortic bifurcation with a rim catheter and an advantage wire and advanced to the right femoral head. Selective right lower extremity angiogram was then performed. This demonstrated Normal common femoral artery and profunda femoris artery. Previous area of intervention in the more proximal SFA had what appeared to be a nonflow limiting dissection proximally with possibly a greater than 50% stenosis but brisk flow downstream. There was then occlusion at Hunter's canal reconstitution of the popliteal artery has previously seen. Below this, the tibial runoff was not easy to opacify but there was a peroneal artery is the best runoff distally with stenosis proximally and a high-grade near occlusive stenosis in the tibioperoneal trunk and distal popliteal  artery. The anterior tibial artery had a high-grade stenosis in the proximal segment and then was diffusely diseased and occluded in the midsegment  although it did appear to reconstitute the foot. The posterior tibial artery was chronically occluded. The patient was systemically heparinized and a 6 [place] Ansell sheath was then placed over the [employer] wire. I then used a Kumpe catheter and the advantage wire to navigate down into the distal SFA. I then tediously cross the right SFA/popliteal occlusion which was extremely difficult. I used a Kumpe catheter and a 0.035 advantage wire initially, but later exchanged for a CXI catheter and a 0.018 advantage wire. It was with the 0.035 advantage wire initially that we cross the occlusion and placed a CXI catheter into the below-knee popliteal artery to confirm intraluminal flow. I then cross the near occlusive stenosis in the tibioperoneal trunk with the CXI catheter and a 0.018 advantage wire and parked this wire at the ankle after confirming intraluminal flow in the peroneal artery. A 3 mm diameter by 10 cm length angioplasty balloon was then used to treat the proximal peroneal artery and tibioperoneal trunk. This was inflated to 12 atm for 1 minute. I then used the smaller balloon to predilate the popliteal and distal SFA with 2 inflations to 14 atm for 1 minute. I then used a 5 mm diameter by 15 cm length Lutonix drug-coated angioplasty balloon to treat the distal SFA and above-knee popliteal artery. This was inflated to 12 atm for 1 minute. Completion angiogram showed a high-grade residual stenosis at the entry point and at least a moderate residual stenosis in the above-knee popliteal artery likely at the reentry point. The tibioperoneal trunk still had a greater than 50% stenosis and I used a 4 mm diameter by 8 cm length angioplasty balloon to treat the tibioperoneal trunk and distal popliteal artery. This was inflated to 14 atm for 1 minute. Completion  angiogram showed only about a 20-25% residual stenosis after this intervention.  I then used the CXI catheter and a 0.018 advantage wire to try to treat the anterior tibial artery. I was able to cross the high-grade proximal stenosis and confirm intraluminal flow, but could not get the catheter to track across the occlusion in the mid segment of the anterior tibial artery. I used a 2.5 mm diameter by 10 cm length angioplasty balloon in the anterior tibial artery proximally to treat the high-grade proximal stenosis in hopes of being able to then track a catheter farther or at least improve the collateral flow. This was inflated to 10 atm for 1 minute. The proximal anterior tibial artery had a less than 30% residual stenosis, but I could still not get the catheter to track down across the occlusion in the mid segment or confirm intraluminal flow beyond the occlusion and so after it was clear this is going to be unsuccessful particularly with the peroneal artery now being in line distally, I turned my attention to improving the popliteal lesion. A 6 mm diameter by 15 cm length Viabahn stent was then deployed from just above the knee to the distal superficial femoral artery. This was postdilated with a 5 mm balloon with an excellent angiographic completion result and less than 10% residual stenosis. I did not treat the what appeared to be nonflow limiting dissection in the proximal SFA at this time, and would see him back in the office in a few weeks for follow-up to recheck his perfusion which it seemed to be markedly improved with the procedure today. I elected to terminate the procedure. The sheath was removed and StarClose closure device was deployed in the left  femoral artery with excellent hemostatic result. The patient was taken to the recovery room in stable condition having tolerated the procedure well.  Findings:   Right Lower Extremity: Normal common femoral artery and profunda  femoris artery. Previous area of intervention in the more proximal SFA had what appeared to be a nonflow limiting dissection proximally with possibly a greater than 50% stenosis but brisk flow downstream. There was then occlusion at Hunter's canal reconstitution of the popliteal artery has previously seen. Below this, the tibial runoff was not easy to opacify but there was a peroneal artery is the best runoff distally with stenosis proximally and a high-grade near occlusive stenosis in the tibioperoneal trunk and distal popliteal artery. The anterior tibial artery had a high-grade stenosis in the proximal segment and then was diffusely diseased and occluded in the midsegment although it did appear to reconstitute the foot. The posterior tibial artery was chronically occluded.   Disposition: Patient was taken to the recovery room in stable condition having tolerated the procedure well.  Complications: None  Leotis Pain 07/18/2016 1:37 PM   This note was created with Dragon Medical transcription system. Any errors in dictation are purely unintentional.

## 2016-07-19 ENCOUNTER — Encounter: Payer: Self-pay | Admitting: Vascular Surgery

## 2016-07-20 ENCOUNTER — Telehealth (INDEPENDENT_AMBULATORY_CARE_PROVIDER_SITE_OTHER): Payer: Self-pay

## 2016-07-20 ENCOUNTER — Encounter: Payer: Self-pay | Admitting: Vascular Surgery

## 2016-07-20 DIAGNOSIS — L97529 Non-pressure chronic ulcer of other part of left foot with unspecified severity: Secondary | ICD-10-CM | POA: Diagnosis not present

## 2016-07-20 DIAGNOSIS — E11621 Type 2 diabetes mellitus with foot ulcer: Secondary | ICD-10-CM | POA: Diagnosis not present

## 2016-07-20 DIAGNOSIS — I10 Essential (primary) hypertension: Secondary | ICD-10-CM | POA: Diagnosis not present

## 2016-07-20 DIAGNOSIS — D649 Anemia, unspecified: Secondary | ICD-10-CM | POA: Diagnosis not present

## 2016-07-20 DIAGNOSIS — E1140 Type 2 diabetes mellitus with diabetic neuropathy, unspecified: Secondary | ICD-10-CM | POA: Diagnosis not present

## 2016-07-20 DIAGNOSIS — L988 Other specified disorders of the skin and subcutaneous tissue: Secondary | ICD-10-CM | POA: Diagnosis not present

## 2016-07-20 DIAGNOSIS — I2510 Atherosclerotic heart disease of native coronary artery without angina pectoris: Secondary | ICD-10-CM | POA: Diagnosis not present

## 2016-07-20 DIAGNOSIS — T8781 Dehiscence of amputation stump: Secondary | ICD-10-CM | POA: Diagnosis not present

## 2016-07-20 DIAGNOSIS — E1151 Type 2 diabetes mellitus with diabetic peripheral angiopathy without gangrene: Secondary | ICD-10-CM | POA: Diagnosis not present

## 2016-07-20 NOTE — Telephone Encounter (Signed)
Daughter called stating that the patient is weak and wanted to know if she could give him his Metformin. His  discharge paperwork states to not take his Metformin 24 hours before or 48 hours after a procedure, tomorrow would be the 48 hours. I explained that she should not give him his Metformin, there is a reason for the wait so she should contact his PCP regarding his weakness.

## 2016-07-21 DIAGNOSIS — L97512 Non-pressure chronic ulcer of other part of right foot with fat layer exposed: Secondary | ICD-10-CM | POA: Diagnosis not present

## 2016-07-21 DIAGNOSIS — L97522 Non-pressure chronic ulcer of other part of left foot with fat layer exposed: Secondary | ICD-10-CM | POA: Diagnosis not present

## 2016-07-25 ENCOUNTER — Encounter (INDEPENDENT_AMBULATORY_CARE_PROVIDER_SITE_OTHER): Payer: Self-pay | Admitting: Vascular Surgery

## 2016-07-25 ENCOUNTER — Inpatient Hospital Stay: Payer: Medicare HMO | Admitting: Internal Medicine

## 2016-07-25 ENCOUNTER — Ambulatory Visit (INDEPENDENT_AMBULATORY_CARE_PROVIDER_SITE_OTHER): Payer: Medicare HMO | Admitting: Vascular Surgery

## 2016-07-25 VITALS — BP 121/73 | HR 62 | Resp 16 | Ht 72.0 in | Wt 179.0 lb

## 2016-07-25 DIAGNOSIS — E1152 Type 2 diabetes mellitus with diabetic peripheral angiopathy with gangrene: Secondary | ICD-10-CM | POA: Diagnosis not present

## 2016-07-25 DIAGNOSIS — I739 Peripheral vascular disease, unspecified: Secondary | ICD-10-CM | POA: Diagnosis not present

## 2016-07-25 NOTE — Progress Notes (Signed)
Subjective:    Patient ID: Matthew Bruce, male    DOB: 1926-06-12, 83 y.o.   MRN: [id number] Chief Complaint  Patient presents with  . Re-evaluation    Left leg   Patient presents s/p both a right and left lower extremity angiogram for bilateral foot ulcerations - 07/06/16 (LLE) and 07/18/16 (RLE). Patient seen with daughter. Both state he is doing well. Some healing to the bilateral foot wounds. Will be follow up with podiatry this week. He denies any worsening of his wound. Denies any fever, nausea or vomiting.    Review of Systems  Constitutional: Negative.   HENT: Negative.   Eyes: Negative.   Respiratory: Negative.   Cardiovascular: Negative.   Gastrointestinal: Negative.   Endocrine: Negative.   Genitourinary: Negative.   Musculoskeletal: Negative.   Skin: Positive for wound.  Allergic/Immunologic: Negative.   Neurological: Negative.   Hematological: Negative.   Psychiatric/Behavioral: Negative.        Objective:   Physical Exam  Constitutional: He is oriented to person, place, and time. He appears well-developed and well-nourished. No distress.  HENT:  Head: Normocephalic and atraumatic.  Right Ear: External ear normal.  Left Ear: External ear normal.  Eyes: Conjunctivae are normal. Pupils are equal, round, and reactive to light.  Neck: Normal range of motion.  Cardiovascular: Normal rate, regular rhythm, normal heart sounds and intact distal pulses.   Pulses:      Radial pulses are 2+ on the right side, and 2+ on the left side.  Unable to palpate pedal pulses.  DP and PT are dopplerable bilaterally.   Pulmonary/Chest: Effort normal.  Musculoskeletal: Normal range of motion. He exhibits no edema.  Neurological: He is alert and oriented to person, place, and time.  Skin: He is not diaphoretic. No erythema. No pallor.     Left: Lateral aspect of foot with stable wound. Right: First big toe ulceration - stable.   Psychiatric: He has a normal mood and affect.  His behavior is normal. Judgment and thought content normal.  Vitals reviewed.  BP 121/73 (BP Location: Right Arm)   Pulse 62   Resp 16   Ht 6' (1.829 m)   Wt 179 lb (81.2 kg)   BMI 24.28 kg/m   Past Medical History:  Diagnosis Date  . Anemia   . Aortic valve disorder   . Coronary artery disease   . Diabetes mellitus without complication ([place])   . Glaucoma   . History of BPH   . History of kidney stones   . HOH (hard of hearing)    Bilateral Hearing Aids  . Hyperlipidemia   . Hypertension   . Kidney stones   . MRSA (methicillin resistant staph aureus) culture positive 04/01/2016   LEFT FOOT  . Myocardial infarction ([place])   . Neuropathy   . Peripheral vascular disease [facility])    Social History   Social History  . Marital status: Married    Spouse name: N/A  . Number of children: N/A  . Years of education: N/A   Occupational History  . Not on file.   Social History Main Topics  . Smoking status: Former Smoker    Packs/day: 1.00    Types: Cigarettes  . Smokeless tobacco: Current User    Types: Chew  . Alcohol use No  . Drug use: No  . Sexual activity: No   Other Topics Concern  . Not on file   Social History Narrative  . No narrative on file  Past Surgical History:  Procedure Laterality Date  . AMPUTATION TOE Left 02/11/2016   Procedure: AMPUTATION TOE;  Surgeon: Albertine Patricia, DPM;  Location: [facility] ORS;  Service: Podiatry;  Laterality: Left;  . APPLICATION OF WOUND VAC Left 04/01/2016   Procedure: APPLICATION OF WOUND VAC;  Surgeon: Albertine Patricia, DPM;  Location: [facility] OR[facility];  Service: Podiatry;  Laterality: Left;  . CARDIAC CATHETERIZATION    . CARDIAC SURGERY    . CARDIAC VALVE REPLACEMENT    . CORONARY ARTERY BYPASS GRAFT    . IRRIGATION AND DEBRIDEMENT FOOT Left 04/01/2016   Procedure: IRRIGATION AND DEBRIDEMENT FOOT;  Surgeon: Albertine Patricia, DPM;  Location: [facility] ORS;  Service: Podiatry;  Laterality: Left;  . IRRIGATION AND DEBRIDEMENT FOOT Left  05/13/2016   Procedure: IRRIGATION AND DEBRIDEMENT FOOT;  Surgeon: Albertine Patricia, DPM;  Location: [facility] OR[facility];  Service: Podiatry;  Laterality: Left;  . LOWER EXTREMITY ANGIOGRAPHY Right 07/06/2016   Procedure: Lower Extremity Angiography;  Surgeon: Algernon Huxley, MD;  Location: [place] CV [facility];  Service: Cardiovascular;  Laterality: Right;  . LOWER EXTREMITY ANGIOGRAPHY Right 07/18/2016   Procedure: Lower Extremity Angiography;  Surgeon: Algernon Huxley, MD;  Location: [place] [facility];  Service: Cardiovascular;  Laterality: Right;  . OSTECTOMY Left 05/13/2016   Procedure: OSTECTOMY;  Surgeon: Albertine Patricia, DPM;  Location: [facility];  Service: Podiatry;  Laterality: Left;  . PERIPHERAL VASCULAR CATHETERIZATION Left 02/03/2016   Procedure: Lower Extremity Angiography;  Surgeon: Algernon Huxley, MD;  Location: [place] [facility];  Service: Cardiovascular;  Laterality: Left;  . PERIPHERAL VASCULAR CATHETERIZATION Left 04/01/2016   Procedure: Lower Extremity Angiography;  Surgeon: Katha Cabal, MD;  Location: [place] CV LAB;  Service: Cardiovascular;  Laterality: Left;  Marland Kitchen VALVE REPLACEMENT  2007   Aortic Valve Replacement, St. Jude Porcine Valve   Family History  Problem Relation Age of Onset  . Heart attack Mother   . Alcohol abuse Father   . Throat cancer Sister   . Heart attack Brother   . Heart failure Neg Hx    Allergies  Allergen Reactions  . Bactrim [Sulfamethoxazole-Trimethoprim]     Other reaction(s): Unknown Severe shaking. Severe shaking.  . Shellfish Allergy Hives and Rash    Patient denies allergy (03/30/16)      Assessment & Plan:  Patient presents s/p both a right and left lower extremity angiogram for bilateral foot ulcerations - 07/06/16 (LLE) and 07/18/16 (RLE). Patient seen with daughter. Both state he is doing well. Some healing to the bilateral foot wounds. Will be follow up with podiatry this week. He denies any worsening of his wound. Denies any fever, nausea  or vomiting.   1. PAD (peripheral artery disease) (HCC) - Stable Wounds stable since last visit.  DP and PT dopplerable bilaterally. Feet warm Possible debridement with podiatry. Will bring patient back in one month for ABI and Duplex. I have discussed with the patient at length the risk factors for and pathogenesis of atherosclerotic disease and encouraged a healthy diet, regular exercise regimen and blood pressure / glucose control.  The patient was encouraged to call the office in the interim if he experiences any claudication like symptoms, rest pain or ulcers to his feet / toes.  - VAS [place] ABI WITH/WO TBI; Future - VAS [place] LOWER EXTREMITY ARTERIAL DUPLEX; Future  2. Type 2 diabetes mellitus with diabetic peripheral angiopathy with gangrene, unspecified whether long term insulin use (HCC) - Stable Encouraged good control as its slows the  progression of atherosclerotic disease  Current Outpatient Prescriptions on File Prior to Visit  Medication Sig Dispense Refill  . amLODipine (NORVASC) 10 MG tablet Take 1 tablet (10 mg total) by mouth at bedtime.    Marland Kitchen amLODipine (NORVASC) 5 MG tablet Take 5 mg by mouth 2 (two) times daily.    . clopidogrel (PLAVIX) 75 MG tablet TAKE 1 TABLET BY MOUTH EVERY DAY 30 tablet 3  . doxycycline (VIBRA-TABS) 100 MG tablet Take 100 mg by mouth 2 (two) times daily.    . finasteride (PROSCAR) 5 MG tablet Take 5 mg by mouth at bedtime.     Marland Kitchen glipiZIDE (GLUCOTROL XL) 5 MG 24 hr tablet Take 5 mg by mouth daily.    Marland Kitchen HYDROcodone-acetaminophen (NORCO/VICODIN) 5-325 MG tablet Take 1 tablet by mouth every 6 (six) hours as needed for moderate pain.     Marland Kitchen ibuprofen (ADVIL,MOTRIN) 800 MG tablet Take 800 mg by mouth every evening.    . latanoprost (XALATAN) 0.005 % ophthalmic solution Place 1 drop into both eyes at bedtime.    . lovastatin (MEVACOR) 40 MG tablet Take 40 mg by mouth daily.     . meclizine (ANTIVERT) 12.5 MG tablet Take 12.5 mg by mouth 4 (four) times daily  as needed for dizziness.     . metFORMIN (GLUCOPHAGE) 1000 MG tablet Take 1,000 mg by mouth 2 (two) times daily with a meal.    . neomycin-bacitracin-polymyxin (NEOSPORIN) ointment Apply 1 application topically daily as needed for wound care.     . predniSONE (DELTASONE) 50 MG tablet Take 50 mg by mouth daily with breakfast.    . sodium chloride (OCEAN) 0.65 % SOLN nasal spray Place 1 spray into both nostrils as needed for congestion.    . tamsulosin (FLOMAX) 0.4 MG CAPS capsule Take 1 capsule (0.4 mg total) by mouth daily. 30 capsule    No current facility-administered medications on file prior to visit.     There are no Patient Instructions on file for this visit. No Follow-up on file.   KIMBERLY A STEGMAYER, PA-C

## 2016-07-26 DIAGNOSIS — I2510 Atherosclerotic heart disease of native coronary artery without angina pectoris: Secondary | ICD-10-CM | POA: Diagnosis not present

## 2016-07-26 DIAGNOSIS — T8781 Dehiscence of amputation stump: Secondary | ICD-10-CM | POA: Diagnosis not present

## 2016-07-26 DIAGNOSIS — D649 Anemia, unspecified: Secondary | ICD-10-CM | POA: Diagnosis not present

## 2016-07-26 DIAGNOSIS — L97529 Non-pressure chronic ulcer of other part of left foot with unspecified severity: Secondary | ICD-10-CM | POA: Diagnosis not present

## 2016-07-26 DIAGNOSIS — E1151 Type 2 diabetes mellitus with diabetic peripheral angiopathy without gangrene: Secondary | ICD-10-CM | POA: Diagnosis not present

## 2016-07-26 DIAGNOSIS — E11621 Type 2 diabetes mellitus with foot ulcer: Secondary | ICD-10-CM | POA: Diagnosis not present

## 2016-07-26 DIAGNOSIS — I10 Essential (primary) hypertension: Secondary | ICD-10-CM | POA: Diagnosis not present

## 2016-07-26 DIAGNOSIS — E1140 Type 2 diabetes mellitus with diabetic neuropathy, unspecified: Secondary | ICD-10-CM | POA: Diagnosis not present

## 2016-07-26 DIAGNOSIS — L988 Other specified disorders of the skin and subcutaneous tissue: Secondary | ICD-10-CM | POA: Diagnosis not present

## 2016-07-27 DIAGNOSIS — L97522 Non-pressure chronic ulcer of other part of left foot with fat layer exposed: Secondary | ICD-10-CM | POA: Diagnosis not present

## 2016-07-27 DIAGNOSIS — L97514 Non-pressure chronic ulcer of other part of right foot with necrosis of bone: Secondary | ICD-10-CM | POA: Diagnosis not present

## 2016-07-29 DIAGNOSIS — E11621 Type 2 diabetes mellitus with foot ulcer: Secondary | ICD-10-CM | POA: Diagnosis not present

## 2016-07-29 DIAGNOSIS — T8781 Dehiscence of amputation stump: Secondary | ICD-10-CM | POA: Diagnosis not present

## 2016-07-29 DIAGNOSIS — D649 Anemia, unspecified: Secondary | ICD-10-CM | POA: Diagnosis not present

## 2016-07-29 DIAGNOSIS — I10 Essential (primary) hypertension: Secondary | ICD-10-CM | POA: Diagnosis not present

## 2016-07-29 DIAGNOSIS — E1151 Type 2 diabetes mellitus with diabetic peripheral angiopathy without gangrene: Secondary | ICD-10-CM | POA: Diagnosis not present

## 2016-07-29 DIAGNOSIS — L988 Other specified disorders of the skin and subcutaneous tissue: Secondary | ICD-10-CM | POA: Diagnosis not present

## 2016-07-29 DIAGNOSIS — I2510 Atherosclerotic heart disease of native coronary artery without angina pectoris: Secondary | ICD-10-CM | POA: Diagnosis not present

## 2016-07-29 DIAGNOSIS — L97529 Non-pressure chronic ulcer of other part of left foot with unspecified severity: Secondary | ICD-10-CM | POA: Diagnosis not present

## 2016-07-29 DIAGNOSIS — E1140 Type 2 diabetes mellitus with diabetic neuropathy, unspecified: Secondary | ICD-10-CM | POA: Diagnosis not present

## 2016-08-02 DIAGNOSIS — I10 Essential (primary) hypertension: Secondary | ICD-10-CM | POA: Diagnosis not present

## 2016-08-02 DIAGNOSIS — E11621 Type 2 diabetes mellitus with foot ulcer: Secondary | ICD-10-CM | POA: Diagnosis not present

## 2016-08-02 DIAGNOSIS — L97529 Non-pressure chronic ulcer of other part of left foot with unspecified severity: Secondary | ICD-10-CM | POA: Diagnosis not present

## 2016-08-02 DIAGNOSIS — E1140 Type 2 diabetes mellitus with diabetic neuropathy, unspecified: Secondary | ICD-10-CM | POA: Diagnosis not present

## 2016-08-02 DIAGNOSIS — T8781 Dehiscence of amputation stump: Secondary | ICD-10-CM | POA: Diagnosis not present

## 2016-08-02 DIAGNOSIS — L988 Other specified disorders of the skin and subcutaneous tissue: Secondary | ICD-10-CM | POA: Diagnosis not present

## 2016-08-02 DIAGNOSIS — I2510 Atherosclerotic heart disease of native coronary artery without angina pectoris: Secondary | ICD-10-CM | POA: Diagnosis not present

## 2016-08-02 DIAGNOSIS — D649 Anemia, unspecified: Secondary | ICD-10-CM | POA: Diagnosis not present

## 2016-08-02 DIAGNOSIS — E1151 Type 2 diabetes mellitus with diabetic peripheral angiopathy without gangrene: Secondary | ICD-10-CM | POA: Diagnosis not present

## 2016-08-03 DIAGNOSIS — L988 Other specified disorders of the skin and subcutaneous tissue: Secondary | ICD-10-CM | POA: Diagnosis not present

## 2016-08-03 DIAGNOSIS — E1142 Type 2 diabetes mellitus with diabetic polyneuropathy: Secondary | ICD-10-CM | POA: Diagnosis not present

## 2016-08-03 DIAGNOSIS — E1151 Type 2 diabetes mellitus with diabetic peripheral angiopathy without gangrene: Secondary | ICD-10-CM | POA: Diagnosis not present

## 2016-08-03 DIAGNOSIS — E1140 Type 2 diabetes mellitus with diabetic neuropathy, unspecified: Secondary | ICD-10-CM | POA: Diagnosis not present

## 2016-08-03 DIAGNOSIS — E11621 Type 2 diabetes mellitus with foot ulcer: Secondary | ICD-10-CM | POA: Diagnosis not present

## 2016-08-03 DIAGNOSIS — T8781 Dehiscence of amputation stump: Secondary | ICD-10-CM | POA: Diagnosis not present

## 2016-08-03 DIAGNOSIS — L97529 Non-pressure chronic ulcer of other part of left foot with unspecified severity: Secondary | ICD-10-CM | POA: Diagnosis not present

## 2016-08-03 DIAGNOSIS — I2510 Atherosclerotic heart disease of native coronary artery without angina pectoris: Secondary | ICD-10-CM | POA: Diagnosis not present

## 2016-08-03 DIAGNOSIS — D649 Anemia, unspecified: Secondary | ICD-10-CM | POA: Diagnosis not present

## 2016-08-03 DIAGNOSIS — E1159 Type 2 diabetes mellitus with other circulatory complications: Secondary | ICD-10-CM | POA: Diagnosis not present

## 2016-08-03 DIAGNOSIS — L97514 Non-pressure chronic ulcer of other part of right foot with necrosis of bone: Secondary | ICD-10-CM | POA: Diagnosis not present

## 2016-08-03 DIAGNOSIS — L97522 Non-pressure chronic ulcer of other part of left foot with fat layer exposed: Secondary | ICD-10-CM | POA: Diagnosis not present

## 2016-08-03 DIAGNOSIS — I10 Essential (primary) hypertension: Secondary | ICD-10-CM | POA: Diagnosis not present

## 2016-08-04 DIAGNOSIS — L97522 Non-pressure chronic ulcer of other part of left foot with fat layer exposed: Secondary | ICD-10-CM | POA: Diagnosis not present

## 2016-08-05 DIAGNOSIS — E11621 Type 2 diabetes mellitus with foot ulcer: Secondary | ICD-10-CM | POA: Diagnosis not present

## 2016-08-05 DIAGNOSIS — L988 Other specified disorders of the skin and subcutaneous tissue: Secondary | ICD-10-CM | POA: Diagnosis not present

## 2016-08-05 DIAGNOSIS — L97529 Non-pressure chronic ulcer of other part of left foot with unspecified severity: Secondary | ICD-10-CM | POA: Diagnosis not present

## 2016-08-05 DIAGNOSIS — E1140 Type 2 diabetes mellitus with diabetic neuropathy, unspecified: Secondary | ICD-10-CM | POA: Diagnosis not present

## 2016-08-05 DIAGNOSIS — I2510 Atherosclerotic heart disease of native coronary artery without angina pectoris: Secondary | ICD-10-CM | POA: Diagnosis not present

## 2016-08-05 DIAGNOSIS — I10 Essential (primary) hypertension: Secondary | ICD-10-CM | POA: Diagnosis not present

## 2016-08-05 DIAGNOSIS — E1151 Type 2 diabetes mellitus with diabetic peripheral angiopathy without gangrene: Secondary | ICD-10-CM | POA: Diagnosis not present

## 2016-08-05 DIAGNOSIS — D649 Anemia, unspecified: Secondary | ICD-10-CM | POA: Diagnosis not present

## 2016-08-05 DIAGNOSIS — T8781 Dehiscence of amputation stump: Secondary | ICD-10-CM | POA: Diagnosis not present

## 2016-08-08 DIAGNOSIS — T8781 Dehiscence of amputation stump: Secondary | ICD-10-CM | POA: Diagnosis not present

## 2016-08-08 DIAGNOSIS — L97529 Non-pressure chronic ulcer of other part of left foot with unspecified severity: Secondary | ICD-10-CM | POA: Diagnosis not present

## 2016-08-09 DIAGNOSIS — E1140 Type 2 diabetes mellitus with diabetic neuropathy, unspecified: Secondary | ICD-10-CM | POA: Diagnosis not present

## 2016-08-09 DIAGNOSIS — T8781 Dehiscence of amputation stump: Secondary | ICD-10-CM | POA: Diagnosis not present

## 2016-08-09 DIAGNOSIS — E1151 Type 2 diabetes mellitus with diabetic peripheral angiopathy without gangrene: Secondary | ICD-10-CM | POA: Diagnosis not present

## 2016-08-09 DIAGNOSIS — L988 Other specified disorders of the skin and subcutaneous tissue: Secondary | ICD-10-CM | POA: Diagnosis not present

## 2016-08-09 DIAGNOSIS — D649 Anemia, unspecified: Secondary | ICD-10-CM | POA: Diagnosis not present

## 2016-08-09 DIAGNOSIS — L97529 Non-pressure chronic ulcer of other part of left foot with unspecified severity: Secondary | ICD-10-CM | POA: Diagnosis not present

## 2016-08-09 DIAGNOSIS — E11621 Type 2 diabetes mellitus with foot ulcer: Secondary | ICD-10-CM | POA: Diagnosis not present

## 2016-08-09 DIAGNOSIS — I2510 Atherosclerotic heart disease of native coronary artery without angina pectoris: Secondary | ICD-10-CM | POA: Diagnosis not present

## 2016-08-09 DIAGNOSIS — I10 Essential (primary) hypertension: Secondary | ICD-10-CM | POA: Diagnosis not present

## 2016-08-10 DIAGNOSIS — L988 Other specified disorders of the skin and subcutaneous tissue: Secondary | ICD-10-CM | POA: Diagnosis not present

## 2016-08-10 DIAGNOSIS — E1140 Type 2 diabetes mellitus with diabetic neuropathy, unspecified: Secondary | ICD-10-CM | POA: Diagnosis not present

## 2016-08-10 DIAGNOSIS — E1151 Type 2 diabetes mellitus with diabetic peripheral angiopathy without gangrene: Secondary | ICD-10-CM | POA: Diagnosis not present

## 2016-08-10 DIAGNOSIS — D649 Anemia, unspecified: Secondary | ICD-10-CM | POA: Diagnosis not present

## 2016-08-10 DIAGNOSIS — T8781 Dehiscence of amputation stump: Secondary | ICD-10-CM | POA: Diagnosis not present

## 2016-08-10 DIAGNOSIS — I10 Essential (primary) hypertension: Secondary | ICD-10-CM | POA: Diagnosis not present

## 2016-08-10 DIAGNOSIS — E11621 Type 2 diabetes mellitus with foot ulcer: Secondary | ICD-10-CM | POA: Diagnosis not present

## 2016-08-10 DIAGNOSIS — L97529 Non-pressure chronic ulcer of other part of left foot with unspecified severity: Secondary | ICD-10-CM | POA: Diagnosis not present

## 2016-08-10 DIAGNOSIS — I2510 Atherosclerotic heart disease of native coronary artery without angina pectoris: Secondary | ICD-10-CM | POA: Diagnosis not present

## 2016-08-12 DIAGNOSIS — E1140 Type 2 diabetes mellitus with diabetic neuropathy, unspecified: Secondary | ICD-10-CM | POA: Diagnosis not present

## 2016-08-12 DIAGNOSIS — E1151 Type 2 diabetes mellitus with diabetic peripheral angiopathy without gangrene: Secondary | ICD-10-CM | POA: Diagnosis not present

## 2016-08-12 DIAGNOSIS — I10 Essential (primary) hypertension: Secondary | ICD-10-CM | POA: Diagnosis not present

## 2016-08-12 DIAGNOSIS — E11621 Type 2 diabetes mellitus with foot ulcer: Secondary | ICD-10-CM | POA: Diagnosis not present

## 2016-08-12 DIAGNOSIS — T8781 Dehiscence of amputation stump: Secondary | ICD-10-CM | POA: Diagnosis not present

## 2016-08-12 DIAGNOSIS — L97529 Non-pressure chronic ulcer of other part of left foot with unspecified severity: Secondary | ICD-10-CM | POA: Diagnosis not present

## 2016-08-12 DIAGNOSIS — L988 Other specified disorders of the skin and subcutaneous tissue: Secondary | ICD-10-CM | POA: Diagnosis not present

## 2016-08-12 DIAGNOSIS — D649 Anemia, unspecified: Secondary | ICD-10-CM | POA: Diagnosis not present

## 2016-08-12 DIAGNOSIS — I2510 Atherosclerotic heart disease of native coronary artery without angina pectoris: Secondary | ICD-10-CM | POA: Diagnosis not present

## 2016-08-15 ENCOUNTER — Ambulatory Visit
Admission: RE | Admit: 2016-08-15 | Discharge: 2016-08-15 | Disposition: A | Discharge status: Home / Self Care | Payer: Medicare HMO | Source: Ambulatory Visit | Attending: Podiatry | Admitting: Podiatry

## 2016-08-15 DIAGNOSIS — M86172 Other acute osteomyelitis, left ankle and foot: Secondary | ICD-10-CM | POA: Diagnosis not present

## 2016-08-15 DIAGNOSIS — I471 Supraventricular tachycardia: Secondary | ICD-10-CM | POA: Insufficient documentation

## 2016-08-15 DIAGNOSIS — E1142 Type 2 diabetes mellitus with diabetic polyneuropathy: Secondary | ICD-10-CM | POA: Diagnosis not present

## 2016-08-15 DIAGNOSIS — L97522 Non-pressure chronic ulcer of other part of left foot with fat layer exposed: Secondary | ICD-10-CM | POA: Diagnosis not present

## 2016-08-15 DIAGNOSIS — Z01810 Encounter for preprocedural cardiovascular examination: Secondary | ICD-10-CM | POA: Diagnosis not present

## 2016-08-15 DIAGNOSIS — I10 Essential (primary) hypertension: Secondary | ICD-10-CM | POA: Diagnosis not present

## 2016-08-15 DIAGNOSIS — I510 Cardiac septal defect, acquired: Secondary | ICD-10-CM | POA: Diagnosis not present

## 2016-08-15 DIAGNOSIS — R9431 Abnormal electrocardiogram [ECG] [EKG]: Secondary | ICD-10-CM | POA: Insufficient documentation

## 2016-08-15 DIAGNOSIS — Z01812 Encounter for preprocedural laboratory examination: Secondary | ICD-10-CM | POA: Insufficient documentation

## 2016-08-15 DIAGNOSIS — E119 Type 2 diabetes mellitus without complications: Secondary | ICD-10-CM | POA: Insufficient documentation

## 2016-08-15 DIAGNOSIS — L97514 Non-pressure chronic ulcer of other part of right foot with necrosis of bone: Secondary | ICD-10-CM | POA: Diagnosis not present

## 2016-08-15 DIAGNOSIS — Z01818 Encounter for other preprocedural examination: Secondary | ICD-10-CM | POA: Diagnosis present

## 2016-08-15 LAB — SURGICAL PCR SCREEN
MRSA, PCR: NEGATIVE
STAPHYLOCOCCUS AUREUS: NEGATIVE

## 2016-08-15 LAB — BASIC METABOLIC PANEL
Anion gap: 13 (ref 5–15)
BUN: 23 mg/dL — AB (ref 6–20)
CALCIUM: 9.3 mg/dL (ref 8.9–10.3)
CO2: 21 mmol/L — ABNORMAL LOW (ref 22–32)
Chloride: 104 mmol/L (ref 101–111)
Creatinine, Ser: 1.26 mg/dL — ABNORMAL HIGH (ref 0.61–1.24)
GFR calc Af Amer: 57 mL/min — ABNORMAL LOW (ref >60)
GFR, EST NON AFRICAN AMERICAN: 49 mL/min — AB (ref >60)
GLUCOSE: 104 mg/dL — AB (ref 65–99)
POTASSIUM: 4.3 mmol/L (ref 3.5–5.1)
SODIUM: 138 mmol/L (ref 135–145)

## 2016-08-15 LAB — HEMOGLOBIN: HEMOGLOBIN: 12.5 g/dL — AB (ref 13.0–18.0)

## 2016-08-15 NOTE — Pre-Procedure Instructions (Signed)
Dr.Penwarden reviewed EKG (08/19/16). No new orders. OK to proceed.

## 2016-08-15 NOTE — Patient Instructions (Signed)
Your procedure is scheduled on:  Report to [place]. 2ND FLOOR MEDICAL MALL ENTRANCE. To find out your arrival time please call [phone] between 1PM - 3PM on Thursday 08/18/16.  Remember: Instructions that are not followed completely may result in serious medical risk, up to and including death, or upon the discretion of your surgeon and anesthesiologist your surgery may need to be rescheduled.    __X__ 1. Do not eat food or drink liquids after midnight. No gum chewing or hard candies.     __X__ 2. No Alcohol for 24 hours before or after surgery.   ____ 3. Bring all medications with you on the day of surgery if instructed.    __X__ 4. Notify your doctor if there is any change in your medical condition     (cold, fever, infections).             ___X__5. No smoking within 24 hours of your surgery.     Do not wear jewelry, make-up, hairpins, clips or nail polish.  Do not wear lotions, powders, or perfumes.   Do not shave 48 hours prior to surgery. Men may shave face and neck.  Do not bring valuables to the hospital.    [facility] is not responsible for any belongings or valuables.               Contacts, dentures or bridgework may not be worn into surgery.  Leave your suitcase in the car. After surgery it may be brought to your room.  For patients admitted to the hospital, discharge time is determined by your                treatment team.   Patients discharged the day of surgery will not be allowed to drive home.   Please read over the following fact sheets that you were given:   MRSA Information   __X__ Take these medicines the morning of surgery with A SIP OF WATER:    1. AMLODIPINE  2. LOVASTATIN  3. TAMSULOSIN  4.  5.  6.  ____ Fleet Enema (as directed)   __X__ Use CHG Soap as directed/SAGE WIPES  ____ Use inhalers on the day of surgery  __X__ Stop metformin 2 days prior to surgery    ____ Take 1/2 of usual insulin dose the night before surgery and none on  the morning of surgery.   __X__ Stop Coumadin/Plavix/aspirin on 08/16/16 AS INSTRUCTED BY YOUR DOCTOR  __X__ Stop Anti-inflammatories such as Advil, Aleve, Ibuprofen, Motrin, Naproxen, Naprosyn, Goodies,powder, or aspirin products.  OK to take Tylenol.   ____ Stop supplements until after surgery.    ____ Bring C-Pap to the hospital.

## 2016-08-17 ENCOUNTER — Encounter (INDEPENDENT_AMBULATORY_CARE_PROVIDER_SITE_OTHER): Payer: Medicare HMO

## 2016-08-17 ENCOUNTER — Ambulatory Visit (INDEPENDENT_AMBULATORY_CARE_PROVIDER_SITE_OTHER): Payer: Medicare HMO | Admitting: Vascular Surgery

## 2016-08-17 ENCOUNTER — Encounter (INDEPENDENT_AMBULATORY_CARE_PROVIDER_SITE_OTHER): Payer: Medicare HMO | Admitting: Vascular Surgery

## 2016-08-17 DIAGNOSIS — I10 Essential (primary) hypertension: Secondary | ICD-10-CM | POA: Diagnosis not present

## 2016-08-17 DIAGNOSIS — L988 Other specified disorders of the skin and subcutaneous tissue: Secondary | ICD-10-CM | POA: Diagnosis not present

## 2016-08-17 DIAGNOSIS — I2510 Atherosclerotic heart disease of native coronary artery without angina pectoris: Secondary | ICD-10-CM | POA: Diagnosis not present

## 2016-08-17 DIAGNOSIS — D649 Anemia, unspecified: Secondary | ICD-10-CM | POA: Diagnosis not present

## 2016-08-17 DIAGNOSIS — E1140 Type 2 diabetes mellitus with diabetic neuropathy, unspecified: Secondary | ICD-10-CM | POA: Diagnosis not present

## 2016-08-17 DIAGNOSIS — E1151 Type 2 diabetes mellitus with diabetic peripheral angiopathy without gangrene: Secondary | ICD-10-CM | POA: Diagnosis not present

## 2016-08-17 DIAGNOSIS — T8781 Dehiscence of amputation stump: Secondary | ICD-10-CM | POA: Diagnosis not present

## 2016-08-17 DIAGNOSIS — L97529 Non-pressure chronic ulcer of other part of left foot with unspecified severity: Secondary | ICD-10-CM | POA: Diagnosis not present

## 2016-08-17 DIAGNOSIS — E11621 Type 2 diabetes mellitus with foot ulcer: Secondary | ICD-10-CM | POA: Diagnosis not present

## 2016-08-18 DIAGNOSIS — E1140 Type 2 diabetes mellitus with diabetic neuropathy, unspecified: Secondary | ICD-10-CM | POA: Diagnosis not present

## 2016-08-18 DIAGNOSIS — I2510 Atherosclerotic heart disease of native coronary artery without angina pectoris: Secondary | ICD-10-CM | POA: Diagnosis not present

## 2016-08-18 DIAGNOSIS — E1151 Type 2 diabetes mellitus with diabetic peripheral angiopathy without gangrene: Secondary | ICD-10-CM | POA: Diagnosis not present

## 2016-08-18 DIAGNOSIS — E11621 Type 2 diabetes mellitus with foot ulcer: Secondary | ICD-10-CM | POA: Diagnosis not present

## 2016-08-18 DIAGNOSIS — I10 Essential (primary) hypertension: Secondary | ICD-10-CM | POA: Diagnosis not present

## 2016-08-18 DIAGNOSIS — D649 Anemia, unspecified: Secondary | ICD-10-CM | POA: Diagnosis not present

## 2016-08-18 DIAGNOSIS — L988 Other specified disorders of the skin and subcutaneous tissue: Secondary | ICD-10-CM | POA: Diagnosis not present

## 2016-08-18 DIAGNOSIS — T8781 Dehiscence of amputation stump: Secondary | ICD-10-CM | POA: Diagnosis not present

## 2016-08-18 DIAGNOSIS — L97529 Non-pressure chronic ulcer of other part of left foot with unspecified severity: Secondary | ICD-10-CM | POA: Diagnosis not present

## 2016-08-19 ENCOUNTER — Ambulatory Visit: Payer: Medicare HMO | Admitting: Certified Registered"

## 2016-08-19 ENCOUNTER — Encounter
Admission: RE | Disposition: A | Discharge status: Home / Self Care | Payer: Self-pay | Source: Ambulatory Visit | Attending: Podiatry

## 2016-08-19 ENCOUNTER — Ambulatory Visit
Admission: RE | Admit: 2016-08-19 | Discharge: 2016-08-19 | Disposition: A | Discharge status: Home / Self Care | Payer: Medicare HMO | Source: Ambulatory Visit | Attending: Podiatry | Admitting: Podiatry

## 2016-08-19 ENCOUNTER — Encounter: Payer: Self-pay | Admitting: *Deleted

## 2016-08-19 DIAGNOSIS — H409 Unspecified glaucoma: Secondary | ICD-10-CM | POA: Diagnosis not present

## 2016-08-19 DIAGNOSIS — I10 Essential (primary) hypertension: Secondary | ICD-10-CM | POA: Diagnosis not present

## 2016-08-19 DIAGNOSIS — N400 Enlarged prostate without lower urinary tract symptoms: Secondary | ICD-10-CM | POA: Diagnosis not present

## 2016-08-19 DIAGNOSIS — I4891 Unspecified atrial fibrillation: Secondary | ICD-10-CM | POA: Diagnosis not present

## 2016-08-19 DIAGNOSIS — M868X7 Other osteomyelitis, ankle and foot: Secondary | ICD-10-CM | POA: Insufficient documentation

## 2016-08-19 DIAGNOSIS — E1151 Type 2 diabetes mellitus with diabetic peripheral angiopathy without gangrene: Secondary | ICD-10-CM | POA: Diagnosis not present

## 2016-08-19 DIAGNOSIS — Z8614 Personal history of Methicillin resistant Staphylococcus aureus infection: Secondary | ICD-10-CM | POA: Insufficient documentation

## 2016-08-19 DIAGNOSIS — M86672 Other chronic osteomyelitis, left ankle and foot: Secondary | ICD-10-CM | POA: Diagnosis not present

## 2016-08-19 DIAGNOSIS — L97514 Non-pressure chronic ulcer of other part of right foot with necrosis of bone: Secondary | ICD-10-CM | POA: Diagnosis not present

## 2016-08-19 DIAGNOSIS — I358 Other nonrheumatic aortic valve disorders: Secondary | ICD-10-CM | POA: Insufficient documentation

## 2016-08-19 DIAGNOSIS — I2510 Atherosclerotic heart disease of native coronary artery without angina pectoris: Secondary | ICD-10-CM | POA: Diagnosis not present

## 2016-08-19 DIAGNOSIS — L97522 Non-pressure chronic ulcer of other part of left foot with fat layer exposed: Secondary | ICD-10-CM | POA: Diagnosis not present

## 2016-08-19 DIAGNOSIS — E785 Hyperlipidemia, unspecified: Secondary | ICD-10-CM | POA: Diagnosis not present

## 2016-08-19 DIAGNOSIS — Z87891 Personal history of nicotine dependence: Secondary | ICD-10-CM | POA: Diagnosis not present

## 2016-08-19 DIAGNOSIS — Z87442 Personal history of urinary calculi: Secondary | ICD-10-CM | POA: Insufficient documentation

## 2016-08-19 DIAGNOSIS — E11621 Type 2 diabetes mellitus with foot ulcer: Secondary | ICD-10-CM | POA: Diagnosis not present

## 2016-08-19 DIAGNOSIS — E1169 Type 2 diabetes mellitus with other specified complication: Secondary | ICD-10-CM | POA: Diagnosis not present

## 2016-08-19 DIAGNOSIS — Z951 Presence of aortocoronary bypass graft: Secondary | ICD-10-CM | POA: Diagnosis not present

## 2016-08-19 DIAGNOSIS — I252 Old myocardial infarction: Secondary | ICD-10-CM | POA: Insufficient documentation

## 2016-08-19 DIAGNOSIS — I480 Paroxysmal atrial fibrillation: Secondary | ICD-10-CM | POA: Diagnosis not present

## 2016-08-19 DIAGNOSIS — I25810 Atherosclerosis of coronary artery bypass graft(s) without angina pectoris: Secondary | ICD-10-CM | POA: Diagnosis not present

## 2016-08-19 DIAGNOSIS — D649 Anemia, unspecified: Secondary | ICD-10-CM | POA: Diagnosis not present

## 2016-08-19 HISTORY — PX: AMPUTATION TOE: SHX6595

## 2016-08-19 HISTORY — PX: INCISION AND DRAINAGE: SHX5863

## 2016-08-19 HISTORY — PX: APLIGRAFT PLACEMENT: SHX5228

## 2016-08-19 LAB — GLUCOSE, CAPILLARY
GLUCOSE-CAPILLARY: 137 mg/dL — AB (ref 65–99)
Glucose-Capillary: 147 mg/dL — ABNORMAL HIGH (ref 65–99)

## 2016-08-19 SURGERY — INCISION AND DRAINAGE
Anesthesia: General | Laterality: Right | Wound class: Clean Contaminated

## 2016-08-19 MED ORDER — LIDOCAINE HCL (PF) 1 % IJ SOLN
INTRAMUSCULAR | Status: AC
  Filled 2016-08-19: qty 30

## 2016-08-19 MED ORDER — SODIUM CHLORIDE 0.9 % IV SOLN
INTRAVENOUS | Status: DC
  Administered 2016-08-19: 09:00:00 via INTRAVENOUS

## 2016-08-19 MED ORDER — FENTANYL CITRATE (PF) 100 MCG/2ML IJ SOLN
INTRAMUSCULAR | Status: AC
  Filled 2016-08-19: qty 2

## 2016-08-19 MED ORDER — FAMOTIDINE 20 MG PO TABS
ORAL_TABLET | ORAL | Status: AC
  Administered 2016-08-19: 20 mg via ORAL
  Filled 2016-08-19: qty 1

## 2016-08-19 MED ORDER — BUPIVACAINE HCL 0.5 % IJ SOLN
INTRAMUSCULAR | Status: DC | PRN
  Administered 2016-08-19: 10 mL

## 2016-08-19 MED ORDER — PROPOFOL 10 MG/ML IV BOLUS
INTRAVENOUS | Status: DC | PRN
  Administered 2016-08-19: 30 mg via INTRAVENOUS

## 2016-08-19 MED ORDER — BUPIVACAINE HCL (PF) 0.5 % IJ SOLN
INTRAMUSCULAR | Status: AC
  Filled 2016-08-19: qty 30

## 2016-08-19 MED ORDER — PROPOFOL 500 MG/50ML IV EMUL
INTRAVENOUS | Status: DC | PRN
  Administered 2016-08-19: 20 ug/kg/min via INTRAVENOUS

## 2016-08-19 MED ORDER — PROPOFOL 10 MG/ML IV BOLUS
INTRAVENOUS | Status: AC
  Filled 2016-08-19: qty 20

## 2016-08-19 MED ORDER — ONDANSETRON HCL 4 MG/2ML IJ SOLN: 4.0000 mg | Freq: Once | INTRAMUSCULAR | Status: DC | PRN

## 2016-08-19 MED ORDER — LIDOCAINE HCL 1 % IJ SOLN
INTRAMUSCULAR | Status: DC | PRN
  Administered 2016-08-19: 10 mL

## 2016-08-19 MED ORDER — FAMOTIDINE 20 MG PO TABS
20.0000 mg | ORAL_TABLET | Freq: Once | ORAL | Status: AC
  Administered 2016-08-19: 20 mg via ORAL

## 2016-08-19 MED ORDER — FENTANYL CITRATE (PF) 100 MCG/2ML IJ SOLN: 25.0000 ug | INTRAMUSCULAR | Status: DC | PRN

## 2016-08-19 SURGICAL SUPPLY — 51 items
APLIGRAF (Prosthesis & Implant Plastic) ×4 IMPLANT
BAG COUNTER SPONGE EZ (MISCELLANEOUS) ×3 IMPLANT
BANDAGE ELASTIC 3 LF NS (GAUZE/BANDAGES/DRESSINGS) ×4 IMPLANT
BANDAGE ELASTIC 4 LF NS (GAUZE/BANDAGES/DRESSINGS) ×4 IMPLANT
BANDAGE STRETCH 3X4.1 STRL (GAUZE/BANDAGES/DRESSINGS) ×4 IMPLANT
BLADE MED AGGRESSIVE (BLADE) ×4 IMPLANT
BLADE SURG 15 STRL LF DISP TIS (BLADE) ×8 IMPLANT
BLADE SURG 15 STRL SS (BLADE) ×8
BNDG ESMARK 4X12 TAN STRL LF (GAUZE/BANDAGES/DRESSINGS) ×4 IMPLANT
BNDG GAUZE 4.5X4.1 6PLY STRL (MISCELLANEOUS) ×8 IMPLANT
CANISTER SUCT 1200ML W/VALVE (MISCELLANEOUS) ×4 IMPLANT
COUNTER SPONGE BAG EZ (MISCELLANEOUS) ×1
CUFF TOURN 18 STER (MISCELLANEOUS) IMPLANT
CUFF TOURN DUAL PL 12 NO SLV (MISCELLANEOUS) ×4 IMPLANT
DRAPE BILAT LIMB 76X120 89291 (MISCELLANEOUS) ×4 IMPLANT
DRAPE FLUOR MINI C-ARM 54X84 (DRAPES) ×4 IMPLANT
DURAPREP 26ML APPLICATOR (WOUND CARE) ×8 IMPLANT
ELECT REM PT RETURN 9FT ADLT (ELECTROSURGICAL) ×4
ELECTRODE REM PT RTRN 9FT ADLT (ELECTROSURGICAL) ×2 IMPLANT
GAUZE PETRO XEROFOAM 1X8 (MISCELLANEOUS) ×4 IMPLANT
GAUZE SPONGE 4X4 12PLY STRL (GAUZE/BANDAGES/DRESSINGS) ×8 IMPLANT
GLOVE BIO SURGEON STRL SZ8 (GLOVE) ×4 IMPLANT
GLOVE INDICATOR 8.0 STRL GRN (GLOVE) ×12 IMPLANT
GOWN STRL REUS W/ TWL LRG LVL3 (GOWN DISPOSABLE) ×4 IMPLANT
GOWN STRL REUS W/TWL LRG LVL3 (GOWN DISPOSABLE) ×4
GRAFT APLIGRAF (Prosthesis & Implant Plastic) ×2 IMPLANT
HANDPIECE VERSAJET DEBRIDEMENT (MISCELLANEOUS) ×4 IMPLANT
KIT RM TURNOVER STRD PROC AR (KITS) ×4 IMPLANT
LABEL OR SOLS (LABEL) ×4 IMPLANT
NDL SAFETY 18GX1.5 (NEEDLE) ×4 IMPLANT
NEEDLE FILTER BLUNT 18X 1/2SAF (NEEDLE) ×2
NEEDLE FILTER BLUNT 18X1 1/2 (NEEDLE) ×2 IMPLANT
NEEDLE HYPO 25X1 1.5 SAFETY (NEEDLE) ×12 IMPLANT
NS IRRIG 500ML POUR BTL (IV SOLUTION) ×4 IMPLANT
PACK EXTREMITY ARMC (MISCELLANEOUS) ×4 IMPLANT
PENCIL ELECTRO HAND CTR (MISCELLANEOUS) ×4 IMPLANT
RASP SM TEAR CROSS CUT (RASP) ×4 IMPLANT
STOCKINETTE STRL 6IN 960660 (GAUZE/BANDAGES/DRESSINGS) ×4 IMPLANT
SUT ETH BLK MONO 3 0 FS 1 12/B (SUTURE) ×4 IMPLANT
SUT ETHILON 3-0 FS-10 30 BLK (SUTURE) ×4
SUT ETHILON 4-0 (SUTURE) ×2
SUT ETHILON 4-0 FS2 18XMFL BLK (SUTURE) ×2
SUT ETHILON 5 0 PS 2 18 (SUTURE) ×4 IMPLANT
SUT VIC AB 3-0 SH 27 (SUTURE) ×2
SUT VIC AB 3-0 SH 27X BRD (SUTURE) ×2 IMPLANT
SUT VIC AB 4-0 FS2 27 (SUTURE) ×4 IMPLANT
SUTURE EHLN 3-0 FS-10 30 BLK (SUTURE) ×2 IMPLANT
SUTURE ETHLN 4-0 FS2 18XMF BLK (SUTURE) ×2 IMPLANT
SWAB CULTURE AMIES ANAERIB BLU (MISCELLANEOUS) ×4 IMPLANT
SYR 3ML LL SCALE MARK (SYRINGE) ×8 IMPLANT
SYRINGE 10CC LL (SYRINGE) ×4 IMPLANT

## 2016-08-19 NOTE — Consult Note (Signed)
[facility] Consultation Note  Patient ID: Matthew Bruce, MRN: [id number], DOB/AGE: 1926-10-14 83 y.o. Admit date: 08/19/2016   Date of Consult: 08/19/2016 Primary Physician: Madelyn Brunner, MD Primary Cardiologist:None  Chief Complaint: No chief complaint on file.  Reason for Consult: atrial fibrillation  HPI: 83 y.o. male with known aortic valve disorder coronary artery disease diabetes with complication essential hypertension makes hyperlipidemia and peripheral vascular disease with the need to 4 amputation of the toe for which the patient has received surgical intervention at this time. In postoperative. The patient has been hemodynamically stable with reasonable heart rate control and currently is in atrial fibrillation with controlled ventricular rate. This appears to be paroxysmal nonvalvular atrial fibrillation in nature and is not causing any significant problems at this time. The patient has been on previous antihypertensive medication management for which has been stable. We have discussed at length the other 2 main issues of treating atrial fibrillation including heart rate control which is currently stable and risk reduction in stroke with atrial fibrillation with anticoagulants. The patient has had surgery and will need to recover from his surgery to reduce bleeding complications before starting it coagulative  Past Medical History:  Diagnosis Date  . Anemia   . Aortic valve disorder   . Coronary artery disease   . Diabetes mellitus without complication ([place])   . Glaucoma   . History of BPH   . History of kidney stones   . HOH (hard of hearing)    Bilateral Hearing Aids  . Hyperlipidemia   . Hypertension   . Kidney stones   . MRSA (methicillin resistant staph aureus) culture positive 04/01/2016   LEFT FOOT  . Myocardial infarction ([place])   . Neuropathy   . Peripheral vascular disease [facility])       Surgical History:  Past Surgical History:   Procedure Laterality Date  . AMPUTATION TOE Left 02/11/2016   Procedure: AMPUTATION TOE;  Surgeon: Albertine Patricia, DPM;  Location: [facility] OR[facility];  Service: Podiatry;  Laterality: Left;  . AMPUTATION TOE Left 08/19/2016   Procedure: AMPUTATION TOE;  Surgeon: Albertine Patricia, DPM;  Location: [facility] OR[facility];  Service: Podiatry;  Laterality: Left;  . APLIGRAFT PLACEMENT Right 08/19/2016   Procedure: APLIGRAFT PLACEMENT;  Surgeon: Albertine Patricia, DPM;  Location: [facility] OR[facility];  Service: Podiatry;  Laterality: Right;  . APPLICATION OF WOUND VAC Left 04/01/2016   Procedure: APPLICATION OF WOUND VAC;  Surgeon: Albertine Patricia, DPM;  Location: [facility] OR[facility];  Service: Podiatry;  Laterality: Left;  . CARDIAC CATHETERIZATION    . CARDIAC SURGERY    . CARDIAC VALVE REPLACEMENT    . CORONARY ARTERY BYPASS GRAFT    . INCISION AND DRAINAGE Right 08/19/2016   Procedure: IRRIGATION AND DEBRIDEMENT RIGHT GREAT TOE;  Surgeon: Albertine Patricia, DPM;  Location: [facility];  Service: Podiatry;  Laterality: Right;  . IRRIGATION AND DEBRIDEMENT FOOT Left 04/01/2016   Procedure: IRRIGATION AND DEBRIDEMENT FOOT;  Surgeon: Albertine Patricia, DPM;  Location: [facility];  Service: Podiatry;  Laterality: Left;  . IRRIGATION AND DEBRIDEMENT FOOT Left 05/13/2016   Procedure: IRRIGATION AND DEBRIDEMENT FOOT;  Surgeon: Albertine Patricia, DPM;  Location: [facility];  Service: Podiatry;  Laterality: Left;  . LOWER EXTREMITY ANGIOGRAPHY Right 07/06/2016   Procedure: Lower Extremity Angiography;  Surgeon: Algernon Huxley, MD;  Location: [place] CV LAB;  Service: Cardiovascular;  Laterality: Right;  . LOWER EXTREMITY ANGIOGRAPHY Right 07/18/2016   Procedure: Lower Extremity Angiography;  Surgeon: Algernon Huxley, MD;  Location: [place] CV LAB;  Service: Cardiovascular;  Laterality: Right;  . OSTECTOMY Left 05/13/2016   Procedure: OSTECTOMY;  Surgeon: Albertine Patricia, DPM;  Location: [facility] OR[facility];  Service: Podiatry;  Laterality: Left;  . PERIPHERAL VASCULAR  CATHETERIZATION Left 02/03/2016   Procedure: Lower Extremity Angiography;  Surgeon: Algernon Huxley, MD;  Location: [place] CV LAB;  Service: Cardiovascular;  Laterality: Left;  . PERIPHERAL VASCULAR CATHETERIZATION Left 04/01/2016   Procedure: Lower Extremity Angiography;  Surgeon: Katha Cabal, MD;  Location: [place] CV LAB;  Service: Cardiovascular;  Laterality: Left;  Marland Kitchen VALVE REPLACEMENT  2007   Aortic Valve Replacement, St. Jude Porcine Valve     Home Meds: Prior to Admission medications   Medication Sig Start Date End Date Taking? Authorizing Provider  clopidogrel (PLAVIX) 75 MG tablet TAKE 1 TABLET BY MOUTH EVERY DAY 05/24/16  Yes Dew, Erskine Squibb, MD  doxycycline (VIBRAMYCIN) 100 MG capsule Take 100 mg by mouth 2 (two) times daily.  07/21/16  Yes [provider]  finasteride (PROSCAR) 5 MG tablet Take 5 mg by mouth at bedtime.    Yes [provider]  glipiZIDE (GLUCOTROL XL) 5 MG 24 hr tablet Take 5 mg by mouth daily with breakfast.  09/06/15 09/05/16 Yes [provider]  ibuprofen (ADVIL,MOTRIN) 800 MG tablet Take 800 mg by mouth at bedtime.    Yes [provider]  latanoprost (XALATAN) 0.005 % ophthalmic solution Place 1 drop into both eyes at bedtime.   Yes [provider]  lovastatin (MEVACOR) 40 MG tablet Take 40 mg by mouth daily.    Yes [provider]  metFORMIN (GLUCOPHAGE) 1000 MG tablet Take 1,000 mg by mouth 2 (two) times daily with a meal.   Yes [provider]  tamsulosin (FLOMAX) 0.4 MG CAPS capsule Take 1 capsule (0.4 mg total) by mouth daily. 05/28/16  Yes Demetrios Loll, MD  amLODipine (NORVASC) 10 MG tablet Take 1 tablet (10 mg total) by mouth at bedtime. Patient not taking: Reported on 08/10/2016 05/30/16   Demetrios Loll, MD  meclizine (ANTIVERT) 12.5 MG tablet Take 12.5 mg by mouth 4 (four) times daily as needed for dizziness.  02/21/16   [provider]    Inpatient Medications:   . sodium chloride  75 mL/hr at 08/19/16 0841    Allergies:  Allergies  Allergen Reactions  . Bactrim [Sulfamethoxazole-Trimethoprim]     Other reaction(s): Unknown Severe shaking. Severe shaking.    Social History   Social History  . Marital status: Married    Spouse name: N/A  . Number of children: N/A  . Years of education: N/A   Occupational History  . Not on file.   Social History Main Topics  . Smoking status: Former Smoker    Packs/day: 1.00    Types: Cigarettes  . Smokeless tobacco: Current User    Types: Chew  . Alcohol use No  . Drug use: No  . Sexual activity: No   Other Topics Concern  . Not on file   Social History Narrative  . No narrative on file     Family History  Problem Relation Age of Onset  . Heart attack Mother   . Alcohol abuse Father   . Throat cancer Sister   . Heart attack Brother   . Heart failure Neg Hx      Review of Systems Positive forLimb pain Negative for: General:  chills, fever, night sweats or weight changes.  Cardiovascular: PND orthopnea syncope dizziness  Dermatological skin lesions rashes Respiratory: Cough congestion Urologic: Frequent urination urination at night and hematuria Abdominal: negative for nausea, vomiting, diarrhea, bright red blood per rectum, melena, or hematemesis Neurologic: negative for visual changes, and/or hearing changes  All other systems reviewed and are otherwise negative except as noted above.  Labs: No results for input(s): CKTOTAL, CKMB, TROPONINI in the last 72 hours. Lab Results  Component Value Date   WBC 7.1 05/27/2016   HGB 12.5 (L) 08/15/2016   HCT 27.2 (L) 05/27/2016   MCV 85.1 05/27/2016   PLT 216 05/27/2016    Recent Labs Lab 08/15/16 1600  NA 138  K 4.3  CL 104  CO2 21*  BUN 23*  CREATININE 1.26*  CALCIUM 9.3  GLUCOSE 104*   Lab Results  Component Value Date   CHOL 135 11/06/2014   HDL 37 (L) 11/06/2014   LDLCALC 76 11/06/2014   TRIG 110 11/06/2014   No results found  for: DDIMER  Radiology/Studies:  No results found.  TWS:FKCLEX fibrillation with controlled ventricular rate and nonspecific ST and T-wave changes  Weights: Filed Weights   08/19/16 0818  Weight: 81.2 kg (179 lb)     Physical Exam: Blood pressure 122/68, pulse 100, temperature 97.3 F (36.3 C), resp. rate 14, height 6' (1.829 m), weight 81.2 kg (179 lb), SpO2 99 %. Body mass index is 24.28 kg/m. General: Well developed, well nourished, in no acute distress. Head eyes ears nose throat: Normocephalic, atraumatic, sclera non-icteric, no xanthomas, nares are without discharge. No apparent thyromegaly and/or mass  Lungs: Normal respiratory effort.  no wheezes, no rales, no rhonchi.  Heart:Irregular with normal S1 S2. no murmur gallop, no rub, PMI is normal size and placement, carotid upstroke normal without bruit, jugular venous pressure is normal Abdomen: Soft, non-tender, non-distended with normoactive bowel sounds. No hepatomegaly. No rebound/guarding. No obvious abdominal masses. Abdominal aorta is normal size without bruit Extremities: Trace edema. no cyanosis, both feet with bandages and amputation Peripheral : 2+ bilateral upper extremity pulses, 2+ bilateral femoral pulses, 2+ bilateral dorsal pedal pulse Neuro: Alert and oriented. No facial asymmetry. No focal deficit. Moves all extremities spontaneously. Musculoskeletal: Normal muscle tone without kyphosis Psych:  Responds to questions appropriately with a normal affect.    Assessment: 83 year old male with essential hypertension makes hyperlipidemia valvular heart disease diabetes with complication and coronary artery disease with the paroxysmal nonvalvular atrial fibrillation  Plan: 1. No further cardiac intervention at this time due to good heart rate control of atrial fibrillation 2. Continue to recover from recent surgery and would consider addition of anticoagulation for further risk reduction in stroke with atrial  fibrillation when healed and bleeding risk is low 3. Continue hypertension control and further treatment of valvular heart disease with consideration of echocardiogram 4. No restrictions to rehabilitation and recovery from surgery  Signed, Corey Skains M.D. [facility] 08/19/2016, 5:18 PM

## 2016-08-19 NOTE — Anesthesia Procedure Notes (Signed)
Performed by: Ameirah Khatoon Pre-anesthesia Checklist: Patient identified, Emergency Drugs available, Suction available, Patient being monitored and Timeout performed Patient Re-evaluated:Patient Re-evaluated prior to inductionOxygen Delivery Method: Simple face mask Preoxygenation: Pre-oxygenation with 100% oxygen Intubation Type: IV induction       

## 2016-08-19 NOTE — Anesthesia Postprocedure Evaluation (Addendum)
Anesthesia Post Note  Patient: DEMITRI KUCINSKI  Procedure(s) Performed: Procedure(s) (LRB): IRRIGATION AND DEBRIDEMENT RIGHT GREAT TOE (Right) APLIGRAFT PLACEMENT (Right) AMPUTATION TOE (Left)  Patient location during evaluation: PACU Anesthesia Type: General Level of consciousness: awake and alert Pain management: pain level controlled Vital Signs Assessment: post-procedure vital signs reviewed and stable Respiratory status: spontaneous breathing, nonlabored ventilation, respiratory function stable and patient connected to nasal cannula oxygen Cardiovascular status: blood pressure returned to baseline and stable Postop Assessment: no signs of nausea or vomiting Anesthetic complications: no Comments: Patient was found to be in atrial fibrillation after his procedure.  Blood pressure was stable throughout, but as this was new onset we called cardiology to come see the patient.  Patient was otherwise stable.     Last Vitals:  Vitals:   08/19/16 1330 08/19/16 1400  BP: 127/83 122/68  Pulse: 100 100  Resp:    Temp:      Last Pain:  Vitals:   08/19/16 0818  TempSrc: Tympanic                 Martha Clan

## 2016-08-19 NOTE — Op Note (Signed)
Operative note   Surgeon: Dr. Albertine Patricia, DPM.    Assistant: None    Preop diagnosis: 1. Osteomyelitis fifth metatarsal and fifth toe proximal phalanx left foot 2. Chronic nonhealing stage IV diabetic ulceration left fifth metatarsophalangeal joint. Wound measured 5 cm in length 3 cm in width and 1.8 cm in depth with exposed metatarsal head 3. Stage IV diabetic ulceration to the right great toe with Apligraf application 4. Stage I ulceration to the right fifth metatarsal and right first metatarsal head laterally and medially respectively. These are small wounds on measuring 5 mm x 3 mm respectively. Apligraf applied to both wounds.    Postop diagnosis: Same    Procedure:   1. Resection of fifth metatarsal head and base of proximal phalanx fifth toe left foot   2. Excision of left fifth metatarsal phalangeal joint stage IV ulceration with sharp scalpel blade excision   3. Debridement and preparation of graft site to the right great toe with Apligraf application.    4. Debridement and wound site preparation for application of graft to the fifth metatarsal head right foot with Apligraf application.    5. Debridement wound site preparation and application of Apligraf to the first metatarsal head right foot    EBL: 15 mL    Anesthesia:general delivered by the anesthesia team and a combination of lidocaine and Marcaine injected at the base of the wound sites by me. Total of 15 cc was combination was utilized    Hemostasis: None    Specimen: 1. Bone culture from the fifth metatarsal head left foot 2. Excised wound from left fifth metatarsophalangeal joint    Complications: None    Operative indications: Patient had chronic wounds these areas and has had bone exposure to the fifth metatarsal phalangeal joint as well as to the distal portion of the great toe.    Procedure:  Patient was brought into the OR and placed on the operating table in thesupine position. After anesthesia was  obtained theleft and the right lower extremity or prepped and draped in usual sterile fashion.  Operative Report: At this time attention directed to the left foot where the chronic stage IV ulcer was identified and measured at above-mentioned margins. The ulcer was excised using a sharp excisional approach and removed. The portion of this ulceration was sent for pathological evaluation. This point soft tissue was freed away from the metatarsal head and the base of the proximal phalanx. The metatarsal was then resected approximately the mid shaft proximal third portion of the bone and removed. A portion of the metatarsal head was cultured for culture and sensitivity. Base proximal phalanx was resected this time also. After copious irrigation and removal of any compromise tissue 3-0 Vicryl was used to suture capsular tissue and cover the bone areas. Skin was then closed with 3-0 nylon simple ruptured and horizontal mattress combination. Sterile compressive dressings placed across wound consisting of Xeroform gauze 4 x 4's Kling Kerlix.  This time attention directed to the right great toe a versa jet was used to remove superficial necrosis. There was some exposed bone on the area which I resected with a bone-cutting forceps. At this point it was irrigated and the Apligraf was prepared and placed across the wound. This wound measured approximately 1.4 cm x 1.5 cm. The Apligraf was sutured in 4-5 different margins with 5-0 nylon.  This time attention directed to the fifth metatarsal head where a smaller ulcer measuring 0.6 x 0.4 cm was identified and debrided  with a versa jet to remove superficial necrosis and the Apligraf was applied to this area and sutured in with 5-0 nylon.  This time to his directed to the first metatarsal head where a smaller lesion measuring 0.4 x 0.2 cm was debrided and Apligraf applied here as well with suturing across the region.  At this time a sterile dressing was placed across  these regions. A nonadherent Vaseline gauze was placed across the Apligraf sites followed by sterile 4 x 4's and Kerlix wraps.    Patient tolerated the procedure and anesthesia well.  Was transported from the OR to the PACU with all vital signs stable and vascular status intact. To be discharged per routine protocol.  Will follow up in approximately 1 week in the outpatient clinic.

## 2016-08-19 NOTE — H&P (Signed)
H and P has been reviewed and no changes are noted.  

## 2016-08-19 NOTE — Anesthesia Post-op Follow-up Note (Cosign Needed)
Anesthesia QCDR form completed.        

## 2016-08-19 NOTE — Discharge Instructions (Signed)
[place] DR[address] [place]   1. Take your medication as prescribed.  Pain medication should be taken only as needed.  2. Keep the dressing clean, dry and intact.  3. Keep your foot elevated no higher than heart  level for the first 48 hours.  4. Walking to the bathroom and brief periods of walking are acceptable, unless we have instructed you to be non-weight bearing.  5. Always wear your post-op shoe when walking.  Always use your crutches if you are to be non-weight bearing.  6. Do not take a shower. Baths are permissible as long as the foot is kept out of the water.   7. Every hour you are awake:  - Bend your knee 15 times. - Flex foot 15 times - Massage calf 15 times  8. Call [facility] [phone]) if any of the following problems occur: - You develop a temperature or fever. - The bandage becomes saturated with blood. - Medication does not stop your pain. - Injury of the foot occurs. - Any symptoms of infection including redness, odor, or red streaks running from wound. -     AMBULATORY SURGERY  DISCHARGE INSTRUCTIONS   1) The drugs that you were given will stay in your system until tomorrow so for the next 24 hours you should not:  A) Drive an automobile B) Make any legal decisions C) Drink any alcoholic beverage   2) You may resume regular meals tomorrow.  Today it is better to start with liquids and gradually work up to solid foods.  You may eat anything you prefer, but it is better to start with liquids, then soup and crackers, and gradually work up to solid foods.   3) Please notify your doctor immediately if you have any unusual bleeding, trouble breathing, redness and pain at the surgery site, drainage, fever, or pain not relieved by medication.    4) Additional Instructions: TAKE A STOOL SOFTENER TWICE A DAY WHILE TAKING  NARCOTIC PAIN MEDICINE TO PREVENT CONSTIPATION   Please contact your physician with any problems or Same Day Surgery at [phone], Monday through Friday 6 am to 4 pm, or [place] at [facility] number at [phone].

## 2016-08-19 NOTE — Anesthesia Preprocedure Evaluation (Signed)
Anesthesia Evaluation  Patient identified by MRN, date of birth, ID band Patient awake    Reviewed: Allergy & Precautions, NPO status , Patient's Chart, lab work & pertinent test results  History of Anesthesia Complications Negative for: history of anesthetic complications  Airway Mallampati: II  TM Distance: >3 FB Neck ROM: Full    Dental  (+) Poor Dentition, Chipped   Pulmonary neg sleep apnea, neg COPD, former smoker,    breath sounds clear to auscultation- rhonchi (-) wheezing      Cardiovascular hypertension, (-) angina+ CAD, + Past MI and + CABG  + Valvular Problems/Murmurs (s/p AVR)  Rhythm:Regular Rate:Normal - Systolic murmurs and - Diastolic murmurs    Neuro/Psych CVA, No Residual Symptoms negative psych ROS   GI/Hepatic negative GI ROS, Neg liver ROS,   Endo/Other  diabetes, Oral Hypoglycemic Agents  Renal/GU Renal disease: hx of nephrolithiasis.     Musculoskeletal negative musculoskeletal ROS (+)   Abdominal (+) - obese,   Peds  Hematology  (+) anemia ,   Anesthesia Other Findings Past Medical History: No date: Anemia No date: Aortic valve disorder No date: Coronary artery disease No date: Diabetes mellitus without complication (HCC) No date: Glaucoma No date: History of BPH No date: History of kidney stones No date: HOH (hard of hearing)     Comment: Bilateral Hearing Aids No date: Hyperlipidemia No date: Hypertension No date: Kidney stones 04/01/2016: MRSA (methicillin resistant staph aureus) cult*     Comment: LEFT FOOT No date: Myocardial infarction ([place]) No date: Neuropathy No date: Peripheral vascular disease ([place])   Reproductive/Obstetrics                             Anesthesia Physical Anesthesia Plan  ASA: III  Anesthesia Plan: General   Post-op Pain Management:    Induction: Intravenous  Airway Management Planned: LMA  Additional Equipment:    Intra-op Plan:   Post-operative Plan:   Informed Consent: I have reviewed the patients History and Physical, chart, labs and discussed the procedure including the risks, benefits and alternatives for the proposed anesthesia with the patient or authorized representative who has indicated his/her understanding and acceptance.   Dental advisory given  Plan Discussed with: CRNA and Anesthesiologist  Anesthesia Plan Comments:         Anesthesia Quick Evaluation

## 2016-08-19 NOTE — Addendum Note (Signed)
Addendum  created 08/19/16 1506 by Martha Clan, MD   Sign clinical note

## 2016-08-19 NOTE — Transfer of Care (Signed)
Immediate Anesthesia Transfer of Care Note  Patient: Matthew Bruce  Procedure(s) Performed: Procedure(s): IRRIGATION AND DEBRIDEMENT RIGHT GREAT TOE (Right) APLIGRAFT PLACEMENT (Right) AMPUTATION TOE (Left)  Patient Location: PACU  Anesthesia Type:MAC  Level of Consciousness: sedated and responds to stimulation  Airway & Oxygen Therapy: Patient Spontanous Breathing and Patient connected to face mask oxygen  Post-op Assessment: Report given to RN and Post -op Vital signs reviewed and stable  Post vital signs: Reviewed and stable  Last Vitals:  Vitals:   08/19/16 0818 08/19/16 1108  BP: 129/79 130/74  Pulse: (!) 103 97  Resp: 18 11  Temp: 36.2 C     Last Pain:  Vitals:   08/19/16 0818  TempSrc: Tympanic         Complications: No apparent anesthesia complications

## 2016-08-19 NOTE — OR Nursing (Signed)
Patient with controlled rate A-fib. Denies palpitations, chest discomfort, or other symptoms.  Consulted Dr. Nehemiah Massed who will see patient before discharge home but states may progress to Phase 2 post-op without cardiac monitor.  Dr. Rosey Bath also updated and ok with transfer to Phase 2.

## 2016-08-22 DIAGNOSIS — E11621 Type 2 diabetes mellitus with foot ulcer: Secondary | ICD-10-CM | POA: Diagnosis not present

## 2016-08-22 DIAGNOSIS — E1140 Type 2 diabetes mellitus with diabetic neuropathy, unspecified: Secondary | ICD-10-CM | POA: Diagnosis not present

## 2016-08-22 DIAGNOSIS — L988 Other specified disorders of the skin and subcutaneous tissue: Secondary | ICD-10-CM | POA: Diagnosis not present

## 2016-08-22 DIAGNOSIS — D649 Anemia, unspecified: Secondary | ICD-10-CM | POA: Diagnosis not present

## 2016-08-22 DIAGNOSIS — E1151 Type 2 diabetes mellitus with diabetic peripheral angiopathy without gangrene: Secondary | ICD-10-CM | POA: Diagnosis not present

## 2016-08-22 DIAGNOSIS — L97529 Non-pressure chronic ulcer of other part of left foot with unspecified severity: Secondary | ICD-10-CM | POA: Diagnosis not present

## 2016-08-22 DIAGNOSIS — T8781 Dehiscence of amputation stump: Secondary | ICD-10-CM | POA: Diagnosis not present

## 2016-08-22 DIAGNOSIS — I10 Essential (primary) hypertension: Secondary | ICD-10-CM | POA: Diagnosis not present

## 2016-08-22 DIAGNOSIS — I2510 Atherosclerotic heart disease of native coronary artery without angina pectoris: Secondary | ICD-10-CM | POA: Diagnosis not present

## 2016-08-23 LAB — AEROBIC CULTURE  (SUPERFICIAL SPECIMEN)

## 2016-08-23 LAB — SURGICAL PATHOLOGY

## 2016-08-23 LAB — AEROBIC CULTURE W GRAM STAIN (SUPERFICIAL SPECIMEN)

## 2016-08-24 LAB — ANAEROBIC CULTURE

## 2016-08-25 DIAGNOSIS — L97529 Non-pressure chronic ulcer of other part of left foot with unspecified severity: Secondary | ICD-10-CM | POA: Diagnosis not present

## 2016-08-25 DIAGNOSIS — L988 Other specified disorders of the skin and subcutaneous tissue: Secondary | ICD-10-CM | POA: Diagnosis not present

## 2016-08-25 DIAGNOSIS — E1151 Type 2 diabetes mellitus with diabetic peripheral angiopathy without gangrene: Secondary | ICD-10-CM | POA: Diagnosis not present

## 2016-08-25 DIAGNOSIS — I10 Essential (primary) hypertension: Secondary | ICD-10-CM | POA: Diagnosis not present

## 2016-08-25 DIAGNOSIS — T8781 Dehiscence of amputation stump: Secondary | ICD-10-CM | POA: Diagnosis not present

## 2016-08-25 DIAGNOSIS — D649 Anemia, unspecified: Secondary | ICD-10-CM | POA: Diagnosis not present

## 2016-08-25 DIAGNOSIS — I2510 Atherosclerotic heart disease of native coronary artery without angina pectoris: Secondary | ICD-10-CM | POA: Diagnosis not present

## 2016-08-25 DIAGNOSIS — E11621 Type 2 diabetes mellitus with foot ulcer: Secondary | ICD-10-CM | POA: Diagnosis not present

## 2016-08-25 DIAGNOSIS — E1140 Type 2 diabetes mellitus with diabetic neuropathy, unspecified: Secondary | ICD-10-CM | POA: Diagnosis not present

## 2016-08-31 DIAGNOSIS — L97529 Non-pressure chronic ulcer of other part of left foot with unspecified severity: Secondary | ICD-10-CM | POA: Diagnosis not present

## 2016-08-31 DIAGNOSIS — E1151 Type 2 diabetes mellitus with diabetic peripheral angiopathy without gangrene: Secondary | ICD-10-CM | POA: Diagnosis not present

## 2016-08-31 DIAGNOSIS — E11621 Type 2 diabetes mellitus with foot ulcer: Secondary | ICD-10-CM | POA: Diagnosis not present

## 2016-08-31 DIAGNOSIS — D649 Anemia, unspecified: Secondary | ICD-10-CM | POA: Diagnosis not present

## 2016-08-31 DIAGNOSIS — I2510 Atherosclerotic heart disease of native coronary artery without angina pectoris: Secondary | ICD-10-CM | POA: Diagnosis not present

## 2016-08-31 DIAGNOSIS — T8781 Dehiscence of amputation stump: Secondary | ICD-10-CM | POA: Diagnosis not present

## 2016-08-31 DIAGNOSIS — E1140 Type 2 diabetes mellitus with diabetic neuropathy, unspecified: Secondary | ICD-10-CM | POA: Diagnosis not present

## 2016-08-31 DIAGNOSIS — I10 Essential (primary) hypertension: Secondary | ICD-10-CM | POA: Diagnosis not present

## 2016-08-31 DIAGNOSIS — L988 Other specified disorders of the skin and subcutaneous tissue: Secondary | ICD-10-CM | POA: Diagnosis not present

## 2016-09-01 DIAGNOSIS — T8781 Dehiscence of amputation stump: Secondary | ICD-10-CM | POA: Diagnosis not present

## 2016-09-01 DIAGNOSIS — L97529 Non-pressure chronic ulcer of other part of left foot with unspecified severity: Secondary | ICD-10-CM | POA: Diagnosis not present

## 2016-09-01 DIAGNOSIS — E1140 Type 2 diabetes mellitus with diabetic neuropathy, unspecified: Secondary | ICD-10-CM | POA: Diagnosis not present

## 2016-09-01 DIAGNOSIS — I10 Essential (primary) hypertension: Secondary | ICD-10-CM | POA: Diagnosis not present

## 2016-09-01 DIAGNOSIS — I2510 Atherosclerotic heart disease of native coronary artery without angina pectoris: Secondary | ICD-10-CM | POA: Diagnosis not present

## 2016-09-01 DIAGNOSIS — D649 Anemia, unspecified: Secondary | ICD-10-CM | POA: Diagnosis not present

## 2016-09-01 DIAGNOSIS — E1151 Type 2 diabetes mellitus with diabetic peripheral angiopathy without gangrene: Secondary | ICD-10-CM | POA: Diagnosis not present

## 2016-09-01 DIAGNOSIS — L988 Other specified disorders of the skin and subcutaneous tissue: Secondary | ICD-10-CM | POA: Diagnosis not present

## 2016-09-01 DIAGNOSIS — E11621 Type 2 diabetes mellitus with foot ulcer: Secondary | ICD-10-CM | POA: Diagnosis not present

## 2016-09-02 DIAGNOSIS — E1140 Type 2 diabetes mellitus with diabetic neuropathy, unspecified: Secondary | ICD-10-CM | POA: Diagnosis not present

## 2016-09-02 DIAGNOSIS — E11621 Type 2 diabetes mellitus with foot ulcer: Secondary | ICD-10-CM | POA: Diagnosis not present

## 2016-09-02 DIAGNOSIS — I2510 Atherosclerotic heart disease of native coronary artery without angina pectoris: Secondary | ICD-10-CM | POA: Diagnosis not present

## 2016-09-02 DIAGNOSIS — E1151 Type 2 diabetes mellitus with diabetic peripheral angiopathy without gangrene: Secondary | ICD-10-CM | POA: Diagnosis not present

## 2016-09-02 DIAGNOSIS — L97529 Non-pressure chronic ulcer of other part of left foot with unspecified severity: Secondary | ICD-10-CM | POA: Diagnosis not present

## 2016-09-02 DIAGNOSIS — I10 Essential (primary) hypertension: Secondary | ICD-10-CM | POA: Diagnosis not present

## 2016-09-02 DIAGNOSIS — D649 Anemia, unspecified: Secondary | ICD-10-CM | POA: Diagnosis not present

## 2016-09-02 DIAGNOSIS — T8781 Dehiscence of amputation stump: Secondary | ICD-10-CM | POA: Diagnosis not present

## 2016-09-02 DIAGNOSIS — L988 Other specified disorders of the skin and subcutaneous tissue: Secondary | ICD-10-CM | POA: Diagnosis not present

## 2016-09-06 ENCOUNTER — Observation Stay
Admission: EM | Admit: 2016-09-06 | Discharge: 2016-09-08 | Disposition: A | Discharge status: Home / Self Care | Payer: Medicare HMO | Attending: Specialist | Admitting: Specialist

## 2016-09-06 ENCOUNTER — Emergency Department: Payer: Medicare HMO

## 2016-09-06 DIAGNOSIS — Z79899 Other long term (current) drug therapy: Secondary | ICD-10-CM | POA: Insufficient documentation

## 2016-09-06 DIAGNOSIS — Z800 Family history of malignant neoplasm of digestive organs: Secondary | ICD-10-CM | POA: Insufficient documentation

## 2016-09-06 DIAGNOSIS — Z952 Presence of prosthetic heart valve: Secondary | ICD-10-CM | POA: Diagnosis not present

## 2016-09-06 DIAGNOSIS — I252 Old myocardial infarction: Secondary | ICD-10-CM | POA: Diagnosis not present

## 2016-09-06 DIAGNOSIS — G4089 Other seizures: Principal | ICD-10-CM | POA: Insufficient documentation

## 2016-09-06 DIAGNOSIS — Z951 Presence of aortocoronary bypass graft: Secondary | ICD-10-CM | POA: Diagnosis not present

## 2016-09-06 DIAGNOSIS — E1140 Type 2 diabetes mellitus with diabetic neuropathy, unspecified: Secondary | ICD-10-CM | POA: Insufficient documentation

## 2016-09-06 DIAGNOSIS — I471 Supraventricular tachycardia: Secondary | ICD-10-CM | POA: Diagnosis not present

## 2016-09-06 DIAGNOSIS — I10 Essential (primary) hypertension: Secondary | ICD-10-CM | POA: Diagnosis not present

## 2016-09-06 DIAGNOSIS — Z882 Allergy status to sulfonamides status: Secondary | ICD-10-CM | POA: Insufficient documentation

## 2016-09-06 DIAGNOSIS — I351 Nonrheumatic aortic (valve) insufficiency: Secondary | ICD-10-CM | POA: Insufficient documentation

## 2016-09-06 DIAGNOSIS — E1151 Type 2 diabetes mellitus with diabetic peripheral angiopathy without gangrene: Secondary | ICD-10-CM | POA: Insufficient documentation

## 2016-09-06 DIAGNOSIS — G40409 Other generalized epilepsy and epileptic syndromes, not intractable, without status epilepticus: Secondary | ICD-10-CM | POA: Diagnosis not present

## 2016-09-06 DIAGNOSIS — E785 Hyperlipidemia, unspecified: Secondary | ICD-10-CM | POA: Diagnosis not present

## 2016-09-06 DIAGNOSIS — I2510 Atherosclerotic heart disease of native coronary artery without angina pectoris: Secondary | ICD-10-CM | POA: Diagnosis not present

## 2016-09-06 DIAGNOSIS — I7025 Atherosclerosis of native arteries of other extremities with ulceration: Secondary | ICD-10-CM | POA: Diagnosis not present

## 2016-09-06 DIAGNOSIS — Z794 Long term (current) use of insulin: Secondary | ICD-10-CM | POA: Insufficient documentation

## 2016-09-06 DIAGNOSIS — E119 Type 2 diabetes mellitus without complications: Secondary | ICD-10-CM

## 2016-09-06 DIAGNOSIS — Z811 Family history of alcohol abuse and dependence: Secondary | ICD-10-CM | POA: Insufficient documentation

## 2016-09-06 DIAGNOSIS — D649 Anemia, unspecified: Secondary | ICD-10-CM | POA: Diagnosis not present

## 2016-09-06 DIAGNOSIS — R569 Unspecified convulsions: Secondary | ICD-10-CM | POA: Diagnosis not present

## 2016-09-06 DIAGNOSIS — N400 Enlarged prostate without lower urinary tract symptoms: Secondary | ICD-10-CM | POA: Insufficient documentation

## 2016-09-06 DIAGNOSIS — I700 Atherosclerosis of aorta: Secondary | ICD-10-CM | POA: Diagnosis not present

## 2016-09-06 DIAGNOSIS — Z8673 Personal history of transient ischemic attack (TIA), and cerebral infarction without residual deficits: Secondary | ICD-10-CM | POA: Insufficient documentation

## 2016-09-06 DIAGNOSIS — L97529 Non-pressure chronic ulcer of other part of left foot with unspecified severity: Secondary | ICD-10-CM | POA: Diagnosis not present

## 2016-09-06 DIAGNOSIS — H9193 Unspecified hearing loss, bilateral: Secondary | ICD-10-CM | POA: Insufficient documentation

## 2016-09-06 DIAGNOSIS — L97519 Non-pressure chronic ulcer of other part of right foot with unspecified severity: Secondary | ICD-10-CM | POA: Insufficient documentation

## 2016-09-06 DIAGNOSIS — Z89422 Acquired absence of other left toe(s): Secondary | ICD-10-CM | POA: Diagnosis not present

## 2016-09-06 DIAGNOSIS — Z7902 Long term (current) use of antithrombotics/antiplatelets: Secondary | ICD-10-CM | POA: Insufficient documentation

## 2016-09-06 DIAGNOSIS — T8781 Dehiscence of amputation stump: Secondary | ICD-10-CM | POA: Diagnosis not present

## 2016-09-06 DIAGNOSIS — H409 Unspecified glaucoma: Secondary | ICD-10-CM | POA: Insufficient documentation

## 2016-09-06 DIAGNOSIS — Z8614 Personal history of Methicillin resistant Staphylococcus aureus infection: Secondary | ICD-10-CM | POA: Insufficient documentation

## 2016-09-06 DIAGNOSIS — Z87891 Personal history of nicotine dependence: Secondary | ICD-10-CM | POA: Insufficient documentation

## 2016-09-06 DIAGNOSIS — E11621 Type 2 diabetes mellitus with foot ulcer: Secondary | ICD-10-CM | POA: Diagnosis not present

## 2016-09-06 DIAGNOSIS — Z87442 Personal history of urinary calculi: Secondary | ICD-10-CM | POA: Diagnosis not present

## 2016-09-06 DIAGNOSIS — L988 Other specified disorders of the skin and subcutaneous tissue: Secondary | ICD-10-CM | POA: Diagnosis not present

## 2016-09-06 DIAGNOSIS — R4182 Altered mental status, unspecified: Secondary | ICD-10-CM | POA: Diagnosis not present

## 2016-09-06 DIAGNOSIS — Z8249 Family history of ischemic heart disease and other diseases of the circulatory system: Secondary | ICD-10-CM | POA: Insufficient documentation

## 2016-09-06 LAB — BASIC METABOLIC PANEL
ANION GAP: 10 (ref 5–15)
BUN: 15 mg/dL (ref 6–20)
CHLORIDE: 106 mmol/L (ref 101–111)
CO2: 21 mmol/L — ABNORMAL LOW (ref 22–32)
Calcium: 8.9 mg/dL (ref 8.9–10.3)
Creatinine, Ser: 1.03 mg/dL (ref 0.61–1.24)
GFR calc non Af Amer: >60 mL/min (ref >60)
Glucose, Bld: 156 mg/dL — ABNORMAL HIGH (ref 65–99)
Potassium: 4.1 mmol/L (ref 3.5–5.1)
Sodium: 137 mmol/L (ref 135–145)

## 2016-09-06 LAB — CBC
HEMATOCRIT: 33.7 % — AB (ref 40.0–52.0)
HEMOGLOBIN: 11.6 g/dL — AB (ref 13.0–18.0)
MCH: 29.2 pg (ref 26.0–34.0)
MCHC: 34.3 g/dL (ref 32.0–36.0)
MCV: 85 fL (ref 80.0–100.0)
Platelets: 252 10*3/uL (ref 150–440)
RBC: 3.96 MIL/uL — AB (ref 4.40–5.90)
RDW: 16.8 % — ABNORMAL HIGH (ref 11.5–14.5)
WBC: 5.6 10*3/uL (ref 3.8–10.6)

## 2016-09-06 LAB — TROPONIN I: Troponin I: <0.03 ng/mL (ref <0.03)

## 2016-09-06 MED ORDER — ONDANSETRON HCL 4 MG/2ML IJ SOLN
INTRAMUSCULAR | Status: AC
  Filled 2016-09-06: qty 2

## 2016-09-06 MED ORDER — SODIUM CHLORIDE 0.9 % IV BOLUS (SEPSIS)
1000.0000 mL | Freq: Once | INTRAVENOUS | Status: AC
  Administered 2016-09-06: 1000 mL via INTRAVENOUS

## 2016-09-06 MED ORDER — ONDANSETRON HCL 4 MG/2ML IJ SOLN
4.0000 mg | Freq: Once | INTRAMUSCULAR | Status: AC
Start: 2016-09-06 — End: 2016-09-06
  Administered 2016-09-06: 4 mg via INTRAVENOUS

## 2016-09-06 NOTE — ED Provider Notes (Signed)
[facility] Emergency Department Provider Note   First MD Initiated Contact with Patient 09/06/16 2300     (approximate)  I have reviewed the triage vital signs and the nursing notes.   HISTORY  Chief Complaint Seizures    HPI Matthew Bruce is a 83 y.o. male with below list of chronic medical conditions  presents to the emergency department via EMS with history of being unresponsive to his daughter at home. EMS states that the patient's daughter found him in bed with snoring respirations unresponsive to verbal and physical stimuli. EMS states on arrival with palpation in the same state however while in route to the emergency department patient mentation improved. Patient is now alert and conversant. Patient denies any complaints at this time.   Past Medical History:  Diagnosis Date  . Anemia   . Aortic valve disorder   . Coronary artery disease   . Diabetes mellitus without complication ([place])   . Glaucoma   . History of BPH   . History of kidney stones   . HOH (hard of hearing)    Bilateral Hearing Aids  . Hyperlipidemia   . Hypertension   . Kidney stones   . MRSA (methicillin resistant staph aureus) culture positive 04/01/2016   LEFT FOOT  . Myocardial infarction ([place])   . Neuropathy   . Peripheral vascular disease [facility])     Patient Active Problem List   Diagnosis Date Noted  . Seizure ([place]) 09/07/2016  . Pressure injury of skin 05/27/2016  . Elevated lactic acid level 05/26/2016  . Weakness 05/26/2016  . PAD (peripheral artery disease) ([place]) 03/24/2016  . Foot ulceration, left, with necrosis of muscle ([place]) 03/24/2016  . Essential hypertension 02/02/2016  . Diabetes ([place]) 02/02/2016  . Atherosclerotic peripheral vascular disease with ulceration ([place]) 02/02/2016  . Valvular heart disease 02/02/2016  . Embolic stroke involving right cerebellar artery ([place]) 11/10/2014  . Hypoglycemia 11/05/2014    Past Surgical History:  Procedure  Laterality Date  . AMPUTATION TOE Left 02/11/2016   Procedure: AMPUTATION TOE;  Surgeon: Albertine Patricia, DPM;  Location: [facility] ORS;  Service: Podiatry;  Laterality: Left;  . AMPUTATION TOE Left 08/19/2016   Procedure: AMPUTATION TOE;  Surgeon: Albertine Patricia, DPM;  Location: [facility] ORS;  Service: Podiatry;  Laterality: Left;  . APLIGRAFT PLACEMENT Right 08/19/2016   Procedure: APLIGRAFT PLACEMENT;  Surgeon: Albertine Patricia, DPM;  Location: [facility] ORS;  Service: Podiatry;  Laterality: Right;  . APPLICATION OF WOUND VAC Left 04/01/2016   Procedure: APPLICATION OF WOUND VAC;  Surgeon: Albertine Patricia, DPM;  Location: [facility] ORS;  Service: Podiatry;  Laterality: Left;  . CARDIAC CATHETERIZATION    . CARDIAC SURGERY    . CARDIAC VALVE REPLACEMENT    . CORONARY ARTERY BYPASS GRAFT    . INCISION AND DRAINAGE Right 08/19/2016   Procedure: IRRIGATION AND DEBRIDEMENT RIGHT GREAT TOE;  Surgeon: Albertine Patricia, DPM;  Location: [facility] ORS;  Service: Podiatry;  Laterality: Right;  . IRRIGATION AND DEBRIDEMENT FOOT Left 04/01/2016   Procedure: IRRIGATION AND DEBRIDEMENT FOOT;  Surgeon: Albertine Patricia, DPM;  Location: [facility] ORS;  Service: Podiatry;  Laterality: Left;  . IRRIGATION AND DEBRIDEMENT FOOT Left 05/13/2016   Procedure: IRRIGATION AND DEBRIDEMENT FOOT;  Surgeon: Albertine Patricia, DPM;  Location: [facility] ORS;  Service: Podiatry;  Laterality: Left;  . LOWER EXTREMITY ANGIOGRAPHY Right 07/06/2016   Procedure: Lower Extremity Angiography;  Surgeon: Algernon Huxley, MD;  Location: [place] [facility];  Service: Cardiovascular;  Laterality: Right;  .  LOWER EXTREMITY ANGIOGRAPHY Right 07/18/2016   Procedure: Lower Extremity Angiography;  Surgeon: Algernon Huxley, MD;  Location: [place] [facility];  Service: Cardiovascular;  Laterality: Right;  . OSTECTOMY Left 05/13/2016   Procedure: OSTECTOMY;  Surgeon: Albertine Patricia, DPM;  Location: [facility] ORS;  Service: Podiatry;  Laterality: Left;  . PERIPHERAL VASCULAR CATHETERIZATION  Left 02/03/2016   Procedure: Lower Extremity Angiography;  Surgeon: Algernon Huxley, MD;  Location: [place] [facility];  Service: Cardiovascular;  Laterality: Left;  . PERIPHERAL VASCULAR CATHETERIZATION Left 04/01/2016   Procedure: Lower Extremity Angiography;  Surgeon: Katha Cabal, MD;  Location: [place] [facility];  Service: Cardiovascular;  Laterality: Left;  Marland Kitchen VALVE REPLACEMENT  2007   Aortic Valve Replacement, [employer] Porcine Valve    Prior to Admission medications   Medication Sig Start Date End Date Taking? Authorizing Provider  amLODipine (NORVASC) 5 MG tablet Take 5 mg by mouth 2 (two) times daily. 08/25/16  Yes [provider]  amoxicillin (AMOXIL) 500 MG capsule Take 500 mg by mouth 3 (three) times daily. 08/26/16 09/08/16 Yes [provider]  ciprofloxacin (CIPRO) 500 MG tablet Take 500 mg by mouth 2 (two) times daily. 08/26/16 09/08/16 Yes [provider]  clopidogrel (PLAVIX) 75 MG tablet TAKE 1 TABLET BY MOUTH EVERY DAY 05/24/16  Yes Dew, Erskine Squibb, MD  finasteride (PROSCAR) 5 MG tablet Take 5 mg by mouth at bedtime.    Yes [provider]  glipiZIDE (GLUCOTROL) 5 MG tablet Take 5 mg by mouth daily. 08/17/16  Yes [provider]  latanoprost (XALATAN) 0.005 % ophthalmic solution Place 1 drop into both eyes at bedtime.   Yes [provider]  lovastatin (MEVACOR) 40 MG tablet Take 40 mg by mouth daily.    Yes [provider]  metFORMIN (GLUCOPHAGE) 1000 MG tablet Take 1,000 mg by mouth 2 (two) times daily with a meal.   Yes [provider]  tamsulosin (FLOMAX) 0.4 MG CAPS capsule Take 1 capsule (0.4 mg total) by mouth daily. 05/28/16  Yes Demetrios Loll, MD    Allergies Sulfa antibiotics  Family History  Problem Relation Age of Onset  . Heart attack Mother   . Alcohol abuse Father   . Throat cancer Sister   . Heart attack Brother   . Heart failure Neg Hx     Social History Social History  Substance Use  Topics  . Smoking status: Former Smoker    Packs/day: 1.00    Types: Cigarettes  . Smokeless tobacco: Current User    Types: Chew  . Alcohol use No    Review of Systems Constitutional: No fever/chills Eyes: No visual changes. ENT: No sore throat. Cardiovascular: Denies chest pain. Respiratory: Denies shortness of breath. Gastrointestinal: No abdominal pain.  No nausea, no vomiting.  No diarrhea.  No constipation. Genitourinary: Negative for dysuria. Musculoskeletal: Negative for neck pain.  Negative for back pain. Integumentary: Negative for rash. Neurological: Negative for headaches, focal weakness or numbness. Positive for Altered mental status. ____________________________________________   PHYSICAL EXAM:  VITAL SIGNS: ED Triage Vitals  Enc Vitals Group     BP 09/06/16 2313 (!) 153/70     Pulse Rate 09/06/16 2313 (!) 103     Resp 09/06/16 2313 18     Temp --      Temp Source 09/06/16 2313 Oral     SpO2 09/06/16 2313 100 %     Weight 09/06/16 2316 81.2 kg (179 lb)  Height 09/06/16 2316 1.829 m (6')     Head Circumference --      Peak Flow --      Pain Score 09/06/16 2313 0     Pain Loc --      Pain Edu? --      Excl. in [place]? --     Constitutional: Alert and oriented. Well appearing and in no acute distress. Eyes: Conjunctivae are normal. PERRL. EOMI. Head: Atraumatic. Mouth/Throat: Mucous membranes are moist.  Oropharynx non-erythematous. Neck: No stridor.  No meningeal signs.   Cardiovascular: Normal rate, regular rhythm. Good peripheral circulation. Grossly normal heart sounds. Respiratory: Normal respiratory effort.  No retractions. Lungs CTAB. Gastrointestinal: Soft and nontender. No distention.  Musculoskeletal: No lower extremity tenderness nor edema. No gross deformities of extremities. Neurologic:  Normal speech and language. No gross focal neurologic deficits are appreciated.  Skin:  Skin is warm, dry and intact. No rash noted. Psychiatric: Mood and  affect are normal. Speech and behavior are normal.  ____________________________________________   LABS (all labs ordered are listed, but only abnormal results are displayed)  Labs Reviewed  BASIC METABOLIC PANEL - Abnormal; Notable for the following:       Result Value   CO2 21 (*)    Glucose, Bld 156 (*)    All other components within normal limits  CBC - Abnormal; Notable for the following:    RBC 3.96 (*)    Hemoglobin 11.6 (*)    HCT 33.7 (*)    RDW 16.8 (*)    All other components within normal limits  TSH - Abnormal; Notable for the following:    TSH 4.701 (*)    All other components within normal limits  TROPONIN I  URINALYSIS, COMPLETE (UACMP) WITH MICROSCOPIC   ____________________________________________  EKG  ED ECG REPORT I, [place] N Marua Qin, the attending physician, personally viewed and interpreted this ECG.   Date: 09/07/2016  EKG Time: 11:15 PM  Rate: 103  Rhythm:  Sinus tachycardia  Axis: Normal  Intervals: Normal  ST&T Change: None  ____________________________________________  RADIOLOGY I, [place] N Dao Mearns, personally viewed and evaluated these images (plain radiographs) as part of my medical decision making, as well as reviewing the written report by the radiologist.  Ct Head Wo Contrast  Result Date: 09/06/2016 CLINICAL DATA:  Altered mental status.  Seizure. EXAM: CT HEAD WITHOUT CONTRAST TECHNIQUE: Contiguous axial images were obtained from the base of the skull through the vertex without intravenous contrast. COMPARISON:  Head CT 05/25/2016 FINDINGS: Brain: Stable degree of atrophy and chronic small vessel ischemia. No intracranial hemorrhage or evidence of acute infarct. No mass effect or midline shift. No subdural or extra-axial fluid collection. Vascular: Atherosclerosis of skullbase vasculature without hyperdense vessel or abnormal calcification. Skull: No skull fracture.  No focal lesion. Sinuses/Orbits: Mucosal thickening of the right  maxillary sinus is partially included. Minimal opacification of left mastoid air cells. Probable left cataract resection. Other: None. IMPRESSION: Stable atrophy and chronic small vessel ischemia. No evidence of acute intracranial abnormality. Electronically Signed   By: Jeb Levering M.D.   On: 09/06/2016 23:52   Dg Chest Port 1 View  Result Date: 09/06/2016 CLINICAL DATA:  Altered mental status.  Seizure. EXAM: PORTABLE CHEST 1 VIEW COMPARISON:  Radiographs and CT February 2018. Radiographs 11/19/2015 FINDINGS: Post median sternotomy. Unchanged heart size and mediastinal contours. There is atherosclerosis of the thoracic aorta. Persistent focal right upper lobe opacity since 2017. This is unchanged in radiographic appearance. No new focal  airspace disease. No pleural fluid, pulmonary edema or pneumothorax. IMPRESSION: 1. Persistent focal right upper lobe airspace opacities since August 2017. This remains suspicious for neoplasm, however is unchanged in radiographic appearance. 2. No new abnormality.  Thoracic aortic atherosclerosis. Electronically Signed   By: Jeb Levering M.D.   On: 09/06/2016 23:56    ____________________________________________   PROCEDURES  Critical Care performed:CRITICAL CARE Performed by: Gregor Hams   Total critical care time: 30 minutes  Critical care time was exclusive of separately billable procedures and treating other patients.  Critical care was necessary to treat or prevent imminent or life-threatening deterioration.  Critical care was time spent personally by me on the following activities: development of treatment plan with patient and/or surrogate as well as nursing, discussions with consultants, evaluation of patient's response to treatment, examination of patient, obtaining history from patient or surrogate, ordering and performing treatments and interventions, ordering and review of laboratory studies, ordering and review of radiographic  studies, pulse oximetry and re-evaluation of patient's condition.     Procedures   ____________________________________________   INITIAL IMPRESSION / ASSESSMENT AND PLAN / ED COURSE  Pertinent labs & imaging results that were available during my care of the patient were reviewed by me and considered in my medical decision making (see chart for details).  Patient had a witnessed generalized tonic-clonic seizure in the emergency department lasting approximately 2 minutes followed by spontaneous resolution. Patient was given Ativan 1 mg IV as well as Keppra 1 g. Patient discussed with Dr. Marcille Blanco hospitalist for hospital admission for further evaluation and management for new onset seizure        ____________________________________________  FINAL CLINICAL IMPRESSION(S) / ED DIAGNOSES  Generalized tonic-clonic Seizure  MEDICATIONS GIVEN DURING THIS VISIT:  Medications  finasteride (PROSCAR) tablet 5 mg (not administered)  latanoprost (XALATAN) 0.005 % ophthalmic solution 1 drop (not administered)  pravastatin (PRAVACHOL) tablet 40 mg (not administered)  clopidogrel (PLAVIX) tablet 75 mg (not administered)  tamsulosin (FLOMAX) capsule 0.4 mg (not administered)  amLODipine (NORVASC) tablet 5 mg (not administered)  enoxaparin (LOVENOX) injection 40 mg (40 mg Subcutaneous Given 09/07/16 0555)  acetaminophen (TYLENOL) tablet 650 mg (not administered)    Or  acetaminophen (TYLENOL) suppository 650 mg (not administered)  ondansetron (ZOFRAN) tablet 4 mg (not administered)    Or  ondansetron (ZOFRAN) injection 4 mg (not administered)  docusate sodium (COLACE) capsule 100 mg (not administered)  piperacillin-tazobactam (ZOSYN) IVPB 3.375 g (3.375 g Intravenous New Bag/Given 09/07/16 0555)  sodium chloride 0.9 % bolus 1,000 mL (0 mLs Intravenous Stopped 09/07/16 0343)  ondansetron (ZOFRAN) injection 4 mg (4 mg Intravenous Given 09/06/16 2333)  levETIRAcetam (KEPPRA) 1,000 mg in sodium  chloride 0.9 % 100 mL IVPB (0 mg Intravenous Stopped 09/07/16 0136)  LORazepam (ATIVAN) injection 1 mg (1 mg Intravenous Given 09/07/16 0034)     NEW OUTPATIENT MEDICATIONS STARTED DURING THIS VISIT:  Current Discharge Medication List      Current Discharge Medication List      Current Discharge Medication List       Note:  This document was prepared using Dragon voice recognition software and may include unintentional dictation errors.    Gregor Hams, MD 09/07/16 [phone]

## 2016-09-06 NOTE — ED Triage Notes (Addendum)
Pt arrived via EMS from Home. Daughter heard a "snoring noise" and slow respirations. Pt. Has a Hx of diabetes. BS-136. EMS did a 12 Lead. EMS reported that they checked his mouth and it appeared as if he had bite his tongue and they suspect a possible seizure. EMS stated that he has an infection on his Right foot. Pt is alert to name, date, and commands but is confused according to daughter and EMS. VS per EMS are O2-96% BP-121/70 HR-100. EMS inserted an 18G L ac. Medication list at the bedside per EMS. Dr. Owens Shark at bedside. NIH stroke test performed by Dr. Owens Shark.

## 2016-09-07 ENCOUNTER — Observation Stay: Payer: Medicare HMO

## 2016-09-07 DIAGNOSIS — I2510 Atherosclerotic heart disease of native coronary artery without angina pectoris: Secondary | ICD-10-CM | POA: Diagnosis not present

## 2016-09-07 DIAGNOSIS — E119 Type 2 diabetes mellitus without complications: Secondary | ICD-10-CM | POA: Diagnosis not present

## 2016-09-07 DIAGNOSIS — R569 Unspecified convulsions: Secondary | ICD-10-CM

## 2016-09-07 DIAGNOSIS — E785 Hyperlipidemia, unspecified: Secondary | ICD-10-CM | POA: Diagnosis not present

## 2016-09-07 LAB — MAGNESIUM: Magnesium: 1.3 mg/dL — ABNORMAL LOW (ref 1.7–2.4)

## 2016-09-07 LAB — PHOSPHORUS: Phosphorus: 2.8 mg/dL (ref 2.5–4.6)

## 2016-09-07 LAB — URINALYSIS, COMPLETE (UACMP) WITH MICROSCOPIC
Bacteria, UA: NONE SEEN
Bilirubin Urine: NEGATIVE
Glucose, UA: NEGATIVE mg/dL
Hgb urine dipstick: NEGATIVE
Ketones, ur: NEGATIVE mg/dL
Leukocytes, UA: NEGATIVE
Nitrite: NEGATIVE
Protein, ur: NEGATIVE mg/dL
Specific Gravity, Urine: 1.015 (ref 1.005–1.030)
Squamous Epithelial / LPF: NONE SEEN
pH: 7 (ref 5.0–8.0)

## 2016-09-07 LAB — COMPREHENSIVE METABOLIC PANEL
ALBUMIN: 3.1 g/dL — AB (ref 3.5–5.0)
ALK PHOS: 53 U/L (ref 38–126)
ALT: 11 U/L — AB (ref 17–63)
AST: 21 U/L (ref 15–41)
Anion gap: 7 (ref 5–15)
BILIRUBIN TOTAL: 0.5 mg/dL (ref 0.3–1.2)
BUN: 14 mg/dL (ref 6–20)
CALCIUM: 8.8 mg/dL — AB (ref 8.9–10.3)
CO2: 25 mmol/L (ref 22–32)
CREATININE: 0.99 mg/dL (ref 0.61–1.24)
Chloride: 108 mmol/L (ref 101–111)
GFR calc Af Amer: >60 mL/min (ref >60)
GFR calc non Af Amer: >60 mL/min (ref >60)
GLUCOSE: 100 mg/dL — AB (ref 65–99)
Potassium: 4.4 mmol/L (ref 3.5–5.1)
SODIUM: 140 mmol/L (ref 135–145)
Total Protein: 5.6 g/dL — ABNORMAL LOW (ref 6.5–8.1)

## 2016-09-07 LAB — TSH: TSH: 4.701 u[IU]/mL — ABNORMAL HIGH (ref 0.350–4.500)

## 2016-09-07 LAB — AMMONIA

## 2016-09-07 MED ORDER — CLOPIDOGREL BISULFATE 75 MG PO TABS
75.0000 mg | ORAL_TABLET | Freq: Every day | ORAL | Status: DC
  Administered 2016-09-07 – 2016-09-08 (×2): 75 mg via ORAL
  Filled 2016-09-07 (×2): qty 1

## 2016-09-07 MED ORDER — ONDANSETRON HCL 4 MG PO TABS: 4.0000 mg | ORAL_TABLET | Freq: Four times a day (QID) | ORAL | Status: DC | PRN

## 2016-09-07 MED ORDER — ONDANSETRON HCL 4 MG/2ML IJ SOLN: 4.0000 mg | Freq: Four times a day (QID) | INTRAMUSCULAR | Status: DC | PRN

## 2016-09-07 MED ORDER — AMOXICILLIN 500 MG PO CAPS
500.0000 mg | ORAL_CAPSULE | Freq: Three times a day (TID) | ORAL | Status: DC
  Administered 2016-09-07 – 2016-09-08 (×4): 500 mg via ORAL
  Filled 2016-09-07 (×5): qty 1

## 2016-09-07 MED ORDER — CIPROFLOXACIN HCL 500 MG PO TABS
500.0000 mg | ORAL_TABLET | Freq: Two times a day (BID) | ORAL | Status: DC
  Administered 2016-09-07 – 2016-09-08 (×2): 500 mg via ORAL
  Filled 2016-09-07 (×2): qty 1

## 2016-09-07 MED ORDER — ACETAMINOPHEN 325 MG PO TABS: 650.0000 mg | ORAL_TABLET | Freq: Four times a day (QID) | ORAL | Status: DC | PRN

## 2016-09-07 MED ORDER — LORAZEPAM 2 MG/ML IJ SOLN
1.0000 mg | Freq: Once | INTRAMUSCULAR | Status: AC
  Administered 2016-09-07: 1 mg via INTRAVENOUS

## 2016-09-07 MED ORDER — LEVETIRACETAM 500 MG PO TABS
500.0000 mg | ORAL_TABLET | Freq: Two times a day (BID) | ORAL | Status: DC
  Administered 2016-09-07 – 2016-09-08 (×3): 500 mg via ORAL
  Filled 2016-09-07 (×3): qty 1

## 2016-09-07 MED ORDER — PIPERACILLIN-TAZOBACTAM 3.375 G IVPB
3.3750 g | Freq: Three times a day (TID) | INTRAVENOUS | Status: DC
  Administered 2016-09-07: 06:00:00 3.375 g via INTRAVENOUS
  Filled 2016-09-07 (×3): qty 50

## 2016-09-07 MED ORDER — LORAZEPAM 2 MG/ML IJ SOLN
INTRAMUSCULAR | Status: AC
  Filled 2016-09-07: qty 1

## 2016-09-07 MED ORDER — FINASTERIDE 5 MG PO TABS
5.0000 mg | ORAL_TABLET | Freq: Every day | ORAL | Status: DC
  Administered 2016-09-07: 5 mg via ORAL
  Filled 2016-09-07: qty 1

## 2016-09-07 MED ORDER — SODIUM CHLORIDE 0.9 % IV SOLN
1000.0000 mg | Freq: Once | INTRAVENOUS | Status: AC
  Administered 2016-09-07: 1000 mg via INTRAVENOUS
  Filled 2016-09-07: qty 10

## 2016-09-07 MED ORDER — ACETAMINOPHEN 650 MG RE SUPP: 650.0000 mg | Freq: Four times a day (QID) | RECTAL | Status: DC | PRN

## 2016-09-07 MED ORDER — AMLODIPINE BESYLATE 5 MG PO TABS
5.0000 mg | ORAL_TABLET | Freq: Two times a day (BID) | ORAL | Status: DC
  Administered 2016-09-07 – 2016-09-08 (×3): 5 mg via ORAL
  Filled 2016-09-07 (×3): qty 1

## 2016-09-07 MED ORDER — DOCUSATE SODIUM 100 MG PO CAPS
100.0000 mg | ORAL_CAPSULE | Freq: Two times a day (BID) | ORAL | Status: DC
  Administered 2016-09-07 – 2016-09-08 (×3): 100 mg via ORAL
  Filled 2016-09-07 (×3): qty 1

## 2016-09-07 MED ORDER — TAMSULOSIN HCL 0.4 MG PO CAPS
0.4000 mg | ORAL_CAPSULE | Freq: Every day | ORAL | Status: DC
  Administered 2016-09-07 – 2016-09-08 (×2): 0.4 mg via ORAL
  Filled 2016-09-07 (×2): qty 1

## 2016-09-07 MED ORDER — ENOXAPARIN SODIUM 40 MG/0.4ML ~~LOC~~ SOLN
40.0000 mg | SUBCUTANEOUS | Status: DC
  Administered 2016-09-07 – 2016-09-08 (×2): 40 mg via SUBCUTANEOUS
  Filled 2016-09-07 (×3): qty 0.4

## 2016-09-07 MED ORDER — PRAVASTATIN SODIUM 40 MG PO TABS
40.0000 mg | ORAL_TABLET | Freq: Every day | ORAL | Status: DC
  Administered 2016-09-07: 40 mg via ORAL
  Filled 2016-09-07: qty 1

## 2016-09-07 MED ORDER — GADOBENATE DIMEGLUMINE 529 MG/ML IV SOLN
20.0000 mL | Freq: Once | INTRAVENOUS | Status: AC | PRN
  Administered 2016-09-07: 17 mL via INTRAVENOUS

## 2016-09-07 MED ORDER — LATANOPROST 0.005 % OP SOLN
1.0000 [drp] | Freq: Every day | OPHTHALMIC | Status: DC
  Filled 2016-09-07: qty 2.5

## 2016-09-07 NOTE — Care Management (Addendum)
Admitted to [facility] with the diagnosis of seizures under observation status. Lives with wife, Lucita Ferrara. Son is [place], [phone]). Spoke with son Nicole Kindred at the bedside. Last seen Dr, Lisette Grinder 06/16/16, Hard of Hearing.  [place] is in the home at present. [employer] 05/30/16 x 2 weeks. No home oxygen. Prescriptions are filled at [facility]. Cane, rolling walker, grab bars, exercise equipment, and glucometer in the home. Takes care of all basic activities of daily living himself, drives usually. No falls. Appetite comes and goes. No Med Alert. Family will transport. Shelbie Ammons RN MSN CCM Care Management [phone]

## 2016-09-07 NOTE — Progress Notes (Signed)
Pharmacy Antibiotic Note  Matthew Bruce is a 83 y.o. male admitted on 09/06/2016 with completion of abx course for diabetic foot ulcer.  Pharmacy has been consulted for zosyn dosing.  Plan: Zosyn 3.375g IV q8h (4 hour infusion).  Height: 6' (182.9 cm) Weight: 187 lb (84.8 kg) IBW/kg (Calculated) : 77.6  Temp (24hrs), Avg:97.7 F (36.5 C), Min:97.7 F (36.5 C), Max:97.7 F (36.5 C)   Recent Labs Lab 09/06/16 2326  WBC 5.6  CREATININE 1.03    Estimated Creatinine Clearance: 55.5 mL/min (by C-G formula based on SCr of 1.03 mg/dL).    Allergies  Allergen Reactions  . Sulfa Antibiotics Other (See Comments)    Reaction: Severe Shaking, Weakness     Thank you for allowing pharmacy to be a part of this patient's care.  Tobie Lords 09/07/2016 5:05 AM

## 2016-09-07 NOTE — Progress Notes (Signed)
[place] [facility] [place] NAME: Matthew Bruce    MR#:  [id number]  DATE OF BIRTH:  [birth year]  SUBJECTIVE:   he is here due to new onset seizures. Family at bedside. No complaints presently.    REVIEW OF SYSTEMS:    Review of Systems  Constitutional: Negative for chills and fever.  HENT: Negative for congestion and tinnitus.   Eyes: Negative for blurred vision and double vision.  Respiratory: Negative for cough, shortness of breath and wheezing.   Cardiovascular: Negative for chest pain, orthopnea and PND.  Gastrointestinal: Negative for abdominal pain, diarrhea, nausea and vomiting.  Genitourinary: Negative for dysuria and hematuria.  Neurological: Negative for dizziness, sensory change and focal weakness.  All other systems reviewed and are negative.   Nutrition: Heart healthy/Carb Control Tolerating Diet: Yes Tolerating PT: Await Eval.      DRUG ALLERGIES:   Allergies  Allergen Reactions  . Sulfa Antibiotics Other (See Comments)    Reaction: Severe Shaking, Weakness    VITALS:  Blood pressure (!) 126/58, pulse 71, temperature 98.1 F (36.7 C), temperature source Oral, resp. rate 20, height 6' (1.829 m), weight 84.8 kg (187 lb), SpO2 98 %.  PHYSICAL EXAMINATION:   Physical Exam  GENERAL:  83 y.o.-year-old patient lying in bed in no acute distress.  EYES: Pupils equal, round, reactive to light and accommodation. No scleral icterus. Extraocular muscles intact.  HEENT: Head atraumatic, normocephalic. Oropharynx and nasopharynx clear.  NECK:  Supple, no jugular venous distention. No thyroid enlargement, no tenderness.  LUNGS: Normal breath sounds bilaterally, no wheezing, rales, rhonchi. No use of accessory muscles of respiration.  CARDIOVASCULAR: S1, S2 normal. No murmurs, rubs, or gallops.  ABDOMEN: Soft, nontender, nondistended. Bowel sounds present. No organomegaly or mass.  EXTREMITIES: No cyanosis, clubbing or edema  b/l.  Dressing on Left foot from recent amputation.   NEUROLOGIC: Cranial nerves II through XII are intact. No focal Motor or sensory deficits b/l.   PSYCHIATRIC: The patient is alert and oriented x 3.  SKIN: No obvious rash, lesion, or ulcer.    LABORATORY PANEL:   CBC  Recent Labs Lab 09/06/16 2326  WBC 5.6  HGB 11.6*  HCT 33.7*  PLT 252   ------------------------------------------------------------------------------------------------------------------  Chemistries   Recent Labs Lab 09/07/16 1052  NA 140  K 4.4  CL 108  CO2 25  GLUCOSE 100*  BUN 14  CREATININE 0.99  CALCIUM 8.8*  MG 1.3*  AST 21  ALT 11*  ALKPHOS 53  BILITOT 0.5   ------------------------------------------------------------------------------------------------------------------  Cardiac Enzymes  Recent Labs Lab 09/06/16 2326  TROPONINI <0.03   ------------------------------------------------------------------------------------------------------------------  RADIOLOGY:  Ct Head Wo Contrast  Result Date: 09/06/2016 CLINICAL DATA:  Altered mental status.  Seizure. EXAM: CT HEAD WITHOUT CONTRAST TECHNIQUE: Contiguous axial images were obtained from the base of the skull through the vertex without intravenous contrast. COMPARISON:  Head CT 05/25/2016 FINDINGS: Brain: Stable degree of atrophy and chronic small vessel ischemia. No intracranial hemorrhage or evidence of acute infarct. No mass effect or midline shift. No subdural or extra-axial fluid collection. Vascular: Atherosclerosis of skullbase vasculature without hyperdense vessel or abnormal calcification. Skull: No skull fracture.  No focal lesion. Sinuses/Orbits: Mucosal thickening of the right maxillary sinus is partially included. Minimal opacification of left mastoid air cells. Probable left cataract resection. Other: None. IMPRESSION: Stable atrophy and chronic small vessel ischemia. No evidence of acute intracranial abnormality.  Electronically Signed   By: Fonnie Birkenhead.D.  On: 09/06/2016 23:52   Dg Chest Port 1 View  Result Date: 09/06/2016 CLINICAL DATA:  Altered mental status.  Seizure. EXAM: PORTABLE CHEST 1 VIEW COMPARISON:  Radiographs and CT February 2018. Radiographs 11/19/2015 FINDINGS: Post median sternotomy. Unchanged heart size and mediastinal contours. There is atherosclerosis of the thoracic aorta. Persistent focal right upper lobe opacity since 2017. This is unchanged in radiographic appearance. No new focal airspace disease. No pleural fluid, pulmonary edema or pneumothorax. IMPRESSION: 1. Persistent focal right upper lobe airspace opacities since August 2017. This remains suspicious for neoplasm, however is unchanged in radiographic appearance. 2. No new abnormality.  Thoracic aortic atherosclerosis. Electronically Signed   By: Jeb Levering M.D.   On: 09/06/2016 23:56     ASSESSMENT AND PLAN:   83 year old male with past medical history of hypertension, hyperlipidemia, BPH, glaucoma, diabetes, history of coronary artery disease, peripheral vascular disease who presented to the hospital due to seizures.  1. New onset seizures-patient had witnessed 2 tonic-clonic seizures, one at home and one in the hospital. -Patient has been loaded with IV Keppra and continue scheduled Keppra for now. -CT head negative for acute pathology. Appreciate neurology consult. We'll get MRI of the brain with and without contrast, EEG. Continue seizure precautions.  2. Peripheral vascular disease status post amputation of the left fifth metatarsal and debridement of the right first toe ulcer -patient follows up with Dr. Elvina Mattes from podiatry. Continue empiric IV Zosyn but patient was on oral antibiotics as outpatient and only has 3 days of treatment left. -Continue Plavix, Pravachol  3. Essential hypertension-continue Norvasc  4. BPH-continue Flomax, finasteride.   All the records are reviewed and case discussed  with Care Management/Social Worker. Management plans discussed with the patient, family and they are in agreement.  CODE STATUS: Full code  DVT Prophylaxis: Lovenox  TOTAL TIME TAKING CARE OF THIS PATIENT: 30 minutes.   POSSIBLE D/C IN 1-2 DAYS, DEPENDING ON CLINICAL CONDITION.   Henreitta Leber M.D on 09/07/2016 at 12:58 PM  Between 7am to 6pm - Pager - [phone]  After 6pm go to [email or web address] - Proofreader  [facility]  Office  [phone]  CC: Primary care physician; Madelyn Brunner, MD

## 2016-09-07 NOTE — Care Management Obs Status (Signed)
[place] NOTIFICATION   Patient Details  Name: EVERHETT BOZARD MRN: [id number] Date of Birth: 11/15/1926   Medicare Observation Status Notification Given:  Yes    Shelbie Ammons, RN 09/07/2016, 10:09 AM

## 2016-09-07 NOTE — ED Notes (Signed)
Inserted in and out foley cath but got no urine back.

## 2016-09-07 NOTE — Consult Note (Signed)
Reason for Consult:New onset seizure Referring Physician: Verdell Carmine  CC: Seizure  HPI: Matthew Bruce is an 83 y.o. male with no previous history of seizures was at home yesterday when daughter heard moaning. When she entered his room she noted some jerking of his extremities. EMS was called.  [name] they arrived patient's level of alertness started improving but once he arrived in the emergency department he had a tonic-clonic seizure lasting a few minutes that was aborted with Ativan 1 mg IV.  The patient was loaded with Keppra in the ED. Patient lethargic this morning and unable to provide history.  All history obtained from son.    Past Medical History:  Diagnosis Date  . Anemia   . Aortic valve disorder   . Coronary artery disease   . Diabetes mellitus without complication ([place])   . Glaucoma   . History of BPH   . History of kidney stones   . HOH (hard of hearing)    Bilateral Hearing Aids  . Hyperlipidemia   . Hypertension   . Kidney stones   . MRSA (methicillin resistant staph aureus) culture positive 04/01/2016   LEFT FOOT  . Myocardial infarction ([place])   . Neuropathy   . Peripheral vascular disease [facility])     Past Surgical History:  Procedure Laterality Date  . AMPUTATION TOE Left 02/11/2016   Procedure: AMPUTATION TOE;  Surgeon: Albertine Patricia, DPM;  Location: [facility] ORS;  Service: Podiatry;  Laterality: Left;  . AMPUTATION TOE Left 08/19/2016   Procedure: AMPUTATION TOE;  Surgeon: Albertine Patricia, DPM;  Location: [facility] ORS;  Service: Podiatry;  Laterality: Left;  . APLIGRAFT PLACEMENT Right 08/19/2016   Procedure: APLIGRAFT PLACEMENT;  Surgeon: Albertine Patricia, DPM;  Location: [facility] OR[facility];  Service: Podiatry;  Laterality: Right;  . APPLICATION OF WOUND VAC Left 04/01/2016   Procedure: APPLICATION OF WOUND VAC;  Surgeon: Albertine Patricia, DPM;  Location: [facility] ORS;  Service: Podiatry;  Laterality: Left;  . CARDIAC CATHETERIZATION    . CARDIAC SURGERY    . CARDIAC VALVE  REPLACEMENT    . CORONARY ARTERY BYPASS GRAFT    . INCISION AND DRAINAGE Right 08/19/2016   Procedure: IRRIGATION AND DEBRIDEMENT RIGHT GREAT TOE;  Surgeon: Albertine Patricia, DPM;  Location: [facility] OR[facility];  Service: Podiatry;  Laterality: Right;  . IRRIGATION AND DEBRIDEMENT FOOT Left 04/01/2016   Procedure: IRRIGATION AND DEBRIDEMENT FOOT;  Surgeon: Albertine Patricia, DPM;  Location: [facility] OR[facility];  Service: Podiatry;  Laterality: Left;  . IRRIGATION AND DEBRIDEMENT FOOT Left 05/13/2016   Procedure: IRRIGATION AND DEBRIDEMENT FOOT;  Surgeon: Albertine Patricia, DPM;  Location: [facility];  Service: Podiatry;  Laterality: Left;  . LOWER EXTREMITY ANGIOGRAPHY Right 07/06/2016   Procedure: Lower Extremity Angiography;  Surgeon: Algernon Huxley, MD;  Location: [place] [facility];  Service: Cardiovascular;  Laterality: Right;  . LOWER EXTREMITY ANGIOGRAPHY Right 07/18/2016   Procedure: Lower Extremity Angiography;  Surgeon: Algernon Huxley, MD;  Location: [place] [facility];  Service: Cardiovascular;  Laterality: Right;  . OSTECTOMY Left 05/13/2016   Procedure: OSTECTOMY;  Surgeon: Albertine Patricia, DPM;  Location: [facility];  Service: Podiatry;  Laterality: Left;  . PERIPHERAL VASCULAR CATHETERIZATION Left 02/03/2016   Procedure: Lower Extremity Angiography;  Surgeon: Algernon Huxley, MD;  Location: [place] [facility];  Service: Cardiovascular;  Laterality: Left;  . PERIPHERAL VASCULAR CATHETERIZATION Left 04/01/2016   Procedure: Lower Extremity Angiography;  Surgeon: Katha Cabal, MD;  Location: [place] [facility];  Service: Cardiovascular;  Laterality:  Left;  . VALVE REPLACEMENT  2007   Aortic Valve Replacement, St. Jude Porcine Valve    Family History  Problem Relation Age of Onset  . Heart attack Mother   . Alcohol abuse Father   . Throat cancer Sister   . Heart attack Brother   . Heart failure Neg Hx     Social History:  reports that he has quit smoking. His smoking use included Cigarettes. He smoked 1.00 pack  per day. His smokeless tobacco use includes Chew. He reports that he does not drink alcohol or use drugs.  Allergies  Allergen Reactions  . Sulfa Antibiotics Other (See Comments)    Reaction: Severe Shaking, Weakness    Medications:  I have reviewed the patient's current medications. Prior to Admission:  Prescriptions Prior to Admission  Medication Sig Dispense Refill Last Dose  . amLODipine (NORVASC) 5 MG tablet Take 5 mg by mouth 2 (two) times daily.  3 09/06/2016 at Unknown time  . amoxicillin (AMOXIL) 500 MG capsule Take 500 mg by mouth 3 (three) times daily.  0 09/06/2016 at Unknown time  . ciprofloxacin (CIPRO) 500 MG tablet Take 500 mg by mouth 2 (two) times daily.  0 09/06/2016 at Unknown time  . clopidogrel (PLAVIX) 75 MG tablet TAKE 1 TABLET BY MOUTH EVERY DAY 30 tablet 3 09/06/2016 at Unknown time  . finasteride (PROSCAR) 5 MG tablet Take 5 mg by mouth at bedtime.    09/06/2016 at Unknown time  . glipiZIDE (GLUCOTROL) 5 MG tablet Take 5 mg by mouth daily.  5 09/06/2016 at Unknown time  . latanoprost (XALATAN) 0.005 % ophthalmic solution Place 1 drop into both eyes at bedtime.   09/06/2016 at Unknown time  . lovastatin (MEVACOR) 40 MG tablet Take 40 mg by mouth daily.    09/06/2016 at Unknown time  . metFORMIN (GLUCOPHAGE) 1000 MG tablet Take 1,000 mg by mouth 2 (two) times daily with a meal.   09/06/2016 at Unknown time  . tamsulosin (FLOMAX) 0.4 MG CAPS capsule Take 1 capsule (0.4 mg total) by mouth daily. 30 capsule  09/06/2016 at Unknown time   Scheduled: . amLODipine  5 mg Oral BID  . clopidogrel  75 mg Oral Daily  . docusate sodium  100 mg Oral BID  . enoxaparin (LOVENOX) injection  40 mg Subcutaneous Q24H  . finasteride  5 mg Oral QHS  . latanoprost  1 drop Both Eyes QHS  . pravastatin  40 mg Oral q1800  . tamsulosin  0.4 mg Oral Daily    ROS: Patient sonorous and unable to provide  Physical Examination: Blood pressure (!) 126/58, pulse 71, temperature 98.1 F (36.7 C),  temperature source Oral, resp. rate 20, height 6' (1.829 m), weight 84.8 kg (187 lb), SpO2 98 %.  HEENT-  Normocephalic, no lesions, without obvious abnormality.  Normal external eye and conjunctiva.  Normal TM's bilaterally.  Normal auditory canals and external ears. Normal external nose, mucus membranes and septum.  Normal pharynx. Cardiovascular- S1, S2 normal, pulses palpable throughout   Lungs- chest clear, no wheezing, rales, normal symmetric air entry Abdomen- soft, non-tender; bowel sounds normal; no masses,  no organomegaly Extremities- evidence of amputations and right big toe lesion Lymph-no adenopathy palpable Musculoskeletal-no joint tenderness, deformity or swelling Skin-warm and dry, no hyperpigmentation, vitiligo, or suspicious lesions  Neurological Examination   Mental Status: Lethargic but able to be aroused.  Requires frequent arousals.  Speech slurred but without evidence of aphasia.  Able to follow 3 step  commands without difficulty. Cranial Nerves: II: Discs flat bilaterally; Visual fields grossly normal, pupils equal, round, reactive to light and accommodation III,IV, VI: ptosis not present, extra-ocular motions intact bilaterally V,VII: smile symmetric, facial light touch sensation normal bilaterally VIII: hearing normal bilaterally IX,X: gag reflex present XI: bilateral shoulder shrug XII: midline tongue extension Motor: Right : Upper extremity   5/5    Left:     Upper extremity   5/5  Lower extremity   5/5     Lower extremity   5/5 Tone and bulk:normal tone throughout; no atrophy noted Sensory: Pinprick and light touch intact throughout, bilaterally Deep Tendon Reflexes: 2+ in the upper extremities, 1+ at the right knee and absent otherwise.  Plantars: Right: unable to perform due to dressing   Left: mute Cerebellar: Normal finger-to-nose and normal heel-to-shin testing bilaterally Gait: not tested due to safety concerns    Laboratory Studies:   Basic  Metabolic Panel:  Recent Labs Lab 09/06/16 2326  NA 137  K 4.1  CL 106  CO2 21*  GLUCOSE 156*  BUN 15  CREATININE 1.03  CALCIUM 8.9    Liver Function Tests: No results for input(s): AST, ALT, ALKPHOS, BILITOT, PROT, ALBUMIN in the last 168 hours. No results for input(s): LIPASE, AMYLASE in the last 168 hours. No results for input(s): AMMONIA in the last 168 hours.  CBC:  Recent Labs Lab 09/06/16 2326  WBC 5.6  HGB 11.6*  HCT 33.7*  MCV 85.0  PLT 252    Cardiac Enzymes:  Recent Labs Lab 09/06/16 2326  TROPONINI <0.03    BNP: Invalid input(s): POCBNP  CBG: No results for input(s): GLUCAP in the last 168 hours.  Microbiology: Results for orders placed or performed during the hospital encounter of 08/19/16  Anaerobic culture     Status: None   Collection Time: 08/19/16 10:29 AM  Result Value Ref Range Status   Specimen Description FOOT  Final   Special Requests 5TH META HEAD OF LEFT FOOT BONE CULTURE  Final   Culture   Final    NO ANAEROBES ISOLATED CORRECTED ON 05/23 AT 1225: PREVIOUSLY REPORTED AS NO ANAEROBES ISOLATED; CULTURE IN PROGRESS FOR 5 DAYS Performed at [facility], [place] [facility] [place] [address], [place], [place] [address]    Report Status 08/24/2016 FINAL  Final  Aerobic Culture (superficial specimen)     Status: None   Collection Time: 08/19/16 10:29 AM  Result Value Ref Range Status   Specimen Description FOOT  Final   Special Requests 5TH META HEAD OF LEFT FOOT BONE CULTURE  Final   Gram Stain   Final    FEW WBC PRESENT, PREDOMINANTLY PMN NO ORGANISMS SEEN    Culture   Final    FEW STAPHYLOCOCCUS EPIDERMIDIS RARE ENTEROCOCCUS FAECALIS CRITICAL RESULT CALLED TO, READ BACK BY AND VERIFIED WITH: [place] [id number] 08/22/16 BY L BENFIELD CONCERNING GROWTH ON CULTURE Performed at [facility], [place] [address], [place], [place] [address]    Report Status 08/23/2016 FINAL  Final   Organism ID, Bacteria STAPHYLOCOCCUS  EPIDERMIDIS  Final   Organism ID, Bacteria ENTEROCOCCUS FAECALIS  Final      Susceptibility   Enterococcus faecalis - MIC*    AMPICILLIN <=2 SENSITIVE Sensitive     VANCOMYCIN 1 SENSITIVE Sensitive     GENTAMICIN SYNERGY RESISTANT Resistant     * RARE ENTEROCOCCUS FAECALIS   Staphylococcus epidermidis - MIC*    CIPROFLOXACIN <=0.5 SENSITIVE Sensitive  ERYTHROMYCIN >=8 RESISTANT Resistant     GENTAMICIN <=0.5 SENSITIVE Sensitive     OXACILLIN >=4 RESISTANT Resistant     TETRACYCLINE >=16 RESISTANT Resistant     VANCOMYCIN 1 SENSITIVE Sensitive     TRIMETH/SULFA 160 RESISTANT Resistant     CLINDAMYCIN >=8 RESISTANT Resistant     RIFAMPIN <=0.5 SENSITIVE Sensitive     Inducible Clindamycin NEGATIVE Sensitive     * FEW STAPHYLOCOCCUS EPIDERMIDIS    Coagulation Studies: No results for input(s): LABPROT, INR in the last 72 hours.  Urinalysis: No results for input(s): COLORURINE, LABSPEC, PHURINE, GLUCOSEU, HGBUR, BILIRUBINUR, KETONESUR, PROTEINUR, UROBILINOGEN, NITRITE, LEUKOCYTESUR in the last 168 hours.  Invalid input(s): APPERANCEUR  Lipid Panel:     Component Value Date/Time   CHOL 135 11/06/2014 0343   TRIG 110 11/06/2014 0343   HDL 37 (L) 11/06/2014 0343   CHOLHDL 3.6 11/06/2014 0343   VLDL 22 11/06/2014 0343   LDLCALC 76 11/06/2014 0343    HgbA1C:  Lab Results  Component Value Date   HGBA1C 6.4 (H) 05/26/2016    Urine Drug Screen:  No results found for: LABOPIA, COCAINSCRNUR, LABBENZ, AMPHETMU, THCU, LABBARB   Alcohol Level: No results for input(s): ETH in the last 168 hours.  Other results: EKG: tachycardic at 103 bpm  Imaging: Ct Head Wo Contrast  Result Date: 09/06/2016 CLINICAL DATA:  Altered mental status.  Seizure. EXAM: CT HEAD WITHOUT CONTRAST TECHNIQUE: Contiguous axial images were obtained from the base of the skull through the vertex without intravenous contrast. COMPARISON:  Head CT 05/25/2016 FINDINGS: Brain: Stable degree of atrophy and chronic  small vessel ischemia. No intracranial hemorrhage or evidence of acute infarct. No mass effect or midline shift. No subdural or extra-axial fluid collection. Vascular: Atherosclerosis of skullbase vasculature without hyperdense vessel or abnormal calcification. Skull: No skull fracture.  No focal lesion. Sinuses/Orbits: Mucosal thickening of the right maxillary sinus is partially included. Minimal opacification of left mastoid air cells. Probable left cataract resection. Other: None. IMPRESSION: Stable atrophy and chronic small vessel ischemia. No evidence of acute intracranial abnormality. Electronically Signed   By: Jeb Levering M.D.   On: 09/06/2016 23:52   Dg Chest Port 1 View  Result Date: 09/06/2016 CLINICAL DATA:  Altered mental status.  Seizure. EXAM: PORTABLE CHEST 1 VIEW COMPARISON:  Radiographs and CT February 2018. Radiographs 11/19/2015 FINDINGS: Post median sternotomy. Unchanged heart size and mediastinal contours. There is atherosclerosis of the thoracic aorta. Persistent focal right upper lobe opacity since 2017. This is unchanged in radiographic appearance. No new focal airspace disease. No pleural fluid, pulmonary edema or pneumothorax. IMPRESSION: 1. Persistent focal right upper lobe airspace opacities since August 2017. This remains suspicious for neoplasm, however is unchanged in radiographic appearance. 2. No new abnormality.  Thoracic aortic atherosclerosis. Electronically Signed   By: Jeb Levering M.D.   On: 09/06/2016 23:56     Assessment/Plan: 83 year old male presenting with new onset seizures.  Neurological examination is nonfocal.  Head CT reviewed and shows no acute changes.  BMET unremarkable.  Further work up recommended.    Recommendations: 1.  MRI of the brain with and without contrast 2.  EEG  3.  Seizure precautions 4.  CMET, serum magnesium and phosphorus 5.  Patient loaded with Keppra in the ED.  Would continue maintenance at 500mg  BID.     Alexis Goodell, MD Neurology [phone] 09/07/2016, 10:22 AM

## 2016-09-07 NOTE — Progress Notes (Addendum)
Pt off unit at 1900 to EEG.   Await pt return. Dorna Bloom RN 2030 Pt returned from EEG.  Dorna Bloom RN

## 2016-09-07 NOTE — ED Notes (Signed)
Pt son calling out to RN station stating pt having potential seizure. RN and Dr. Owens Shark at bedside. Verbal order for 1 mg Ativan given per MD. Pt placed on non-rebreather and turned to right side.

## 2016-09-07 NOTE — H&P (Addendum)
Matthew Bruce is an 83 y.o. male.   Chief Complaint: New onset seizure HPI: The patient with past medical history of diabetes, coronary artery disease status post CABG and aortic valve replacement, hypertension and presbycusis presents to the emergency department following a seizure.  Two weeks ago he underwent fifth metatarsal amputation of the left foot and debridement of a first right toe ulcer. The patient has been in his usual state of health (relatively good given his chronic medical illnesses) and had visited his podiatrist earlier today when he suffered a potential seizure after supper tonight. Seizure activity was unwitnessed but he was found to be sonorous and difficult to arouse. EMS found his level of alertness to be improving but once he arrived in the emergency department he had a tonic-clonic seizure lasting a few minutes that was aborted with Ativan 1 mg IV. The patient does not have a history of seizure disorder.The patient was loaded with Keppra in the emergency department and the hospitalist service was called for admission.  Past Medical History:  Diagnosis Date  . Anemia   . Aortic valve disorder   . Coronary artery disease   . Diabetes mellitus without complication ([place])   . Glaucoma   . History of BPH   . History of kidney stones   . HOH (hard of hearing)    Bilateral Hearing Aids  . Hyperlipidemia   . Hypertension   . Kidney stones   . MRSA (methicillin resistant staph aureus) culture positive 04/01/2016   LEFT FOOT  . Myocardial infarction ([place])   . Neuropathy   . Peripheral vascular disease [facility])     Past Surgical History:  Procedure Laterality Date  . AMPUTATION TOE Left 02/11/2016   Procedure: AMPUTATION TOE;  Surgeon: Albertine Patricia, DPM;  Location: [facility] ORS;  Service: Podiatry;  Laterality: Left;  . AMPUTATION TOE Left 08/19/2016   Procedure: AMPUTATION TOE;  Surgeon: Albertine Patricia, DPM;  Location: [facility] ORS;  Service: Podiatry;  Laterality: Left;  .  APLIGRAFT PLACEMENT Right 08/19/2016   Procedure: APLIGRAFT PLACEMENT;  Surgeon: Albertine Patricia, DPM;  Location: [facility] ORS;  Service: Podiatry;  Laterality: Right;  . APPLICATION OF WOUND VAC Left 04/01/2016   Procedure: APPLICATION OF WOUND VAC;  Surgeon: Albertine Patricia, DPM;  Location: [facility] ORS;  Service: Podiatry;  Laterality: Left;  . CARDIAC CATHETERIZATION    . CARDIAC SURGERY    . CARDIAC VALVE REPLACEMENT    . CORONARY ARTERY BYPASS GRAFT    . INCISION AND DRAINAGE Right 08/19/2016   Procedure: IRRIGATION AND DEBRIDEMENT RIGHT GREAT TOE;  Surgeon: Albertine Patricia, DPM;  Location: [facility] OR[facility];  Service: Podiatry;  Laterality: Right;  . IRRIGATION AND DEBRIDEMENT FOOT Left 04/01/2016   Procedure: IRRIGATION AND DEBRIDEMENT FOOT;  Surgeon: Albertine Patricia, DPM;  Location: [facility] OR[facility];  Service: Podiatry;  Laterality: Left;  . IRRIGATION AND DEBRIDEMENT FOOT Left 05/13/2016   Procedure: IRRIGATION AND DEBRIDEMENT FOOT;  Surgeon: Albertine Patricia, DPM;  Location: [facility] OR[facility];  Service: Podiatry;  Laterality: Left;  . LOWER EXTREMITY ANGIOGRAPHY Right 07/06/2016   Procedure: Lower Extremity Angiography;  Surgeon: Algernon Huxley, MD;  Location: [place] [facility];  Service: Cardiovascular;  Laterality: Right;  . LOWER EXTREMITY ANGIOGRAPHY Right 07/18/2016   Procedure: Lower Extremity Angiography;  Surgeon: Algernon Huxley, MD;  Location: [place] [facility];  Service: Cardiovascular;  Laterality: Right;  . OSTECTOMY Left 05/13/2016   Procedure: OSTECTOMY;  Surgeon: Albertine Patricia, DPM;  Location: [facility];  Service: Podiatry;  Laterality: Left;  . PERIPHERAL VASCULAR CATHETERIZATION Left 02/03/2016   Procedure: Lower Extremity Angiography;  Surgeon: Algernon Huxley, MD;  Location: [place] [facility];  Service: Cardiovascular;  Laterality: Left;  . PERIPHERAL VASCULAR CATHETERIZATION Left 04/01/2016   Procedure: Lower Extremity Angiography;  Surgeon: Katha Cabal, MD;  Location: [place] [facility];   Service: Cardiovascular;  Laterality: Left;  Marland Kitchen VALVE REPLACEMENT  2007   Aortic Valve Replacement, [employer] Jude Porcine Valve    Family History  Problem Relation Age of Onset  . Heart attack Mother   . Alcohol abuse Father   . Throat cancer Sister   . Heart attack Brother   . Heart failure Neg Hx    Social History:  reports that he has quit smoking. His smoking use included Cigarettes. He smoked 1.00 pack per day. His smokeless tobacco use includes Chew. He reports that he does not drink alcohol or use drugs.  Allergies:  Allergies  Allergen Reactions  . Sulfa Antibiotics Other (See Comments)    Reaction: Severe Shaking, Weakness     (Not in a hospital admission)  Results for orders placed or performed during the hospital encounter of 09/06/16 (from the past 48 hour(s))  Basic metabolic panel     Status: Abnormal   Collection Time: 09/06/16 11:26 PM  Result Value Ref Range   Sodium 137 135 - 145 mmol/L   Potassium 4.1 3.5 - 5.1 mmol/L   Chloride 106 101 - 111 mmol/L   CO2 21 (L) 22 - 32 mmol/L   Glucose, Bld 156 (H) 65 - 99 mg/dL   BUN 15 6 - 20 mg/dL   Creatinine, Ser 1.03 0.61 - 1.24 mg/dL   Calcium 8.9 8.9 - 10.3 mg/dL   GFR calc non Af Amer >60 >60 mL/min   GFR calc Af Amer >60 >60 mL/min    Comment: (NOTE) The eGFR has been calculated using the CKD EPI equation. This calculation has not been validated in all clinical situations. eGFR's persistently <60 mL/min signify possible Chronic Kidney Disease.    Anion gap 10 5 - 15  CBC     Status: Abnormal   Collection Time: 09/06/16 11:26 PM  Result Value Ref Range   WBC 5.6 3.8 - 10.6 K/uL   RBC 3.96 (L) 4.40 - 5.90 MIL/uL   Hemoglobin 11.6 (L) 13.0 - 18.0 g/dL   HCT 33.7 (L) 40.0 - 52.0 %   MCV 85.0 80.0 - 100.0 fL   MCH 29.2 26.0 - 34.0 pg   MCHC 34.3 32.0 - 36.0 g/dL   RDW 16.8 (H) 11.5 - 14.5 %   Platelets 252 150 - 440 K/uL  Troponin I     Status: None   Collection Time: 09/06/16 11:26 PM  Result Value Ref  Range   Troponin I <0.03 <0.03 ng/mL   Ct Head Wo Contrast  Result Date: 09/06/2016 CLINICAL DATA:  Altered mental status.  Seizure. EXAM: CT HEAD WITHOUT CONTRAST TECHNIQUE: Contiguous axial images were obtained from the base of the skull through the vertex without intravenous contrast. COMPARISON:  Head CT 05/25/2016 FINDINGS: Brain: Stable degree of atrophy and chronic small vessel ischemia. No intracranial hemorrhage or evidence of acute infarct. No mass effect or midline shift. No subdural or extra-axial fluid collection. Vascular: Atherosclerosis of skullbase vasculature without hyperdense vessel or abnormal calcification. Skull: No skull fracture.  No focal lesion. Sinuses/Orbits: Mucosal thickening of the right maxillary sinus is partially included. Minimal opacification of left mastoid air cells.  Probable left cataract resection. Other: None. IMPRESSION: Stable atrophy and chronic small vessel ischemia. No evidence of acute intracranial abnormality. Electronically Signed   By: Jeb Levering M.D.   On: 09/06/2016 23:52   Dg Chest Port 1 View  Result Date: 09/06/2016 CLINICAL DATA:  Altered mental status.  Seizure. EXAM: PORTABLE CHEST 1 VIEW COMPARISON:  Radiographs and CT February 2018. Radiographs 11/19/2015 FINDINGS: Post median sternotomy. Unchanged heart size and mediastinal contours. There is atherosclerosis of the thoracic aorta. Persistent focal right upper lobe opacity since 2017. This is unchanged in radiographic appearance. No new focal airspace disease. No pleural fluid, pulmonary edema or pneumothorax. IMPRESSION: 1. Persistent focal right upper lobe airspace opacities since August 2017. This remains suspicious for neoplasm, however is unchanged in radiographic appearance. 2. No new abnormality.  Thoracic aortic atherosclerosis. Electronically Signed   By: Jeb Levering M.D.   On: 09/06/2016 23:56    Review of Systems  Unable to perform ROS: Medical condition ([place])     Blood pressure 135/68, pulse 77, resp. rate 17, height 6' (1.829 m), weight 81.2 kg (179 lb), SpO2 96 %. Physical Exam  Nursing note and vitals reviewed. Constitutional: He appears well-developed and well-nourished. No distress.  HENT:  Head: Normocephalic and atraumatic.  Mouth/Throat: Oropharynx is clear and moist.  Eyes: Conjunctivae and EOM are normal. Pupils are equal, round, and reactive to light. No scleral icterus.  Neck: Normal range of motion. Neck supple. No JVD present. No tracheal deviation present. No thyromegaly present.  Cardiovascular: Normal rate and regular rhythm.  Exam reveals no gallop and no friction rub.   No murmur heard. Respiratory: Effort normal and breath sounds normal. No respiratory distress.  GI: Soft. Bowel sounds are normal. He exhibits no distension. There is no tenderness.  Genitourinary:  Genitourinary Comments: Deferred  Musculoskeletal: Normal range of motion. He exhibits no edema.  Lymphadenopathy:    He has no cervical adenopathy.  Neurological: No cranial nerve deficit.  Postictal  Skin: Skin is warm and dry. No rash noted. No erythema.  Psychiatric:  Difficult to assess as the patient is somnolent     Assessment/Plan This is an 83 year old male admitted for new onset seizures. 1. Seizure activity: Tonic-clonic; resolved. Unclear etiology. CT head without contrast negative for intracranial mass, however this does not rule out isodense lesions. May need contrast CT or MRI. Consult neurology. Seizure precautions. 2. Diabetes mellitus type 2: Hold glipizide and metformin. Sliding scale insulin while hospitalized. 3. Hyperlipidemia: Continue statin therapy 4. Hypertension: Controlled; continue amlodipine 5. Coronary artery disease: Stable; continue Plavix 6. Diabetic foot ulcers: Healing well. I have given the patient Zosyn in place of his amoxicillin and ciprofloxacin. He is due for only 3 more days of antibiotics. Transition to oral  medication. 7. Lung mass: stable in size; no tissue diagnosis at this time 8. DVT prophylaxis: Lovenox 9. GI prophylaxis: None The patient is a full code area time spent on admission orders and patient care approximately 45 minutes  Harrie Foreman, MD 09/07/2016, 4:29 AM

## 2016-09-08 DIAGNOSIS — E119 Type 2 diabetes mellitus without complications: Secondary | ICD-10-CM | POA: Diagnosis not present

## 2016-09-08 DIAGNOSIS — E785 Hyperlipidemia, unspecified: Secondary | ICD-10-CM | POA: Diagnosis not present

## 2016-09-08 DIAGNOSIS — R569 Unspecified convulsions: Secondary | ICD-10-CM | POA: Diagnosis not present

## 2016-09-08 DIAGNOSIS — I2510 Atherosclerotic heart disease of native coronary artery without angina pectoris: Secondary | ICD-10-CM | POA: Diagnosis not present

## 2016-09-08 MED ORDER — LEVETIRACETAM 500 MG PO TABS: 500.0000 mg | ORAL_TABLET | Freq: Two times a day (BID) | ORAL | 1 refills | Status: AC

## 2016-09-08 MED ORDER — MAGNESIUM SULFATE 4 GM/100ML IV SOLN
4.0000 g | Freq: Once | INTRAVENOUS | Status: AC
  Administered 2016-09-08: 4 g via INTRAVENOUS
  Filled 2016-09-08: qty 100

## 2016-09-08 NOTE — Discharge Summary (Signed)
[name] NAME: Matthew Bruce    MR#:  [id number]  DATE OF BIRTH:  06-28-1926  DATE OF ADMISSION:  09/06/2016 ADMITTING PHYSICIAN: Harrie Foreman, MD  DATE OF DISCHARGE: 09/08/2016  PRIMARY CARE PHYSICIAN: Madelyn Brunner, MD    ADMISSION DIAGNOSIS:  Seizure  DISCHARGE DIAGNOSIS:  Active Problems:   Seizure ([place])   SECONDARY DIAGNOSIS:   Past Medical History:  Diagnosis Date  . Anemia   . Aortic valve disorder   . Coronary artery disease   . Diabetes mellitus without complication ([place])   . Glaucoma   . History of BPH   . History of kidney stones   . HOH (hard of hearing)    Bilateral Hearing Aids  . Hyperlipidemia   . Hypertension   . Kidney stones   . MRSA (methicillin resistant staph aureus) culture positive 04/01/2016   LEFT FOOT  . Myocardial infarction ([place])   . Neuropathy   . Peripheral vascular disease [facility])     HOSPITAL COURSE:   83 year old male with past medical history of hypertension, hyperlipidemia, BPH, glaucoma, diabetes, history of coronary artery disease, peripheral vascular disease who presented to the hospital due to seizures.  1. New onset seizures-patient had witnessed 2 tonic-clonic seizures, one at home and one in the hospital. -Patient was admitted to the hospital placed on seizure precautions. He was placed on IV Keppra. A CT scan of the head and MRI of the brain obtained showing no acute pathology but just some old strokes. -Patient also had an EEG done which was nondiagnostic for seizures. Seen by neurology and they're recommending discharging patient on oral Keppra and follow up with neurology as an outpatient.  2. Peripheral vascular disease status post amputation of the left fifth metatarsal and debridement of the right first toe ulcer -patient follows up with Dr. Elvina Mattes from podiatry. Continue his empiric oral ciprofloxacin, amoxicillin. -Continue Plavix, Pravachol  3.  Essential hypertension- he will continue Norvasc  4. BPH- he will continue Flomax, finasteride.  5. DM - he will cont. His Metformin, Glipizide.  - while in hospital pt. Was maintained on SSI.   DISCHARGE CONDITIONS:   Stable.   CONSULTS OBTAINED:  Treatment Team:  Alexis Goodell, MD  DRUG ALLERGIES:   Allergies  Allergen Reactions  . Sulfa Antibiotics Other (See Comments)    Reaction: Severe Shaking, Weakness    DISCHARGE MEDICATIONS:   Allergies as of 09/08/2016      Reactions   Sulfa Antibiotics Other (See Comments)   Reaction: Severe Shaking, Weakness      Medication List    TAKE these medications   amLODipine 5 MG tablet Commonly known as:  NORVASC Take 5 mg by mouth 2 (two) times daily.   amoxicillin 500 MG capsule Commonly known as:  AMOXIL Take 500 mg by mouth 3 (three) times daily.   ciprofloxacin 500 MG tablet Commonly known as:  CIPRO Take 500 mg by mouth 2 (two) times daily.   clopidogrel 75 MG tablet Commonly known as:  PLAVIX TAKE 1 TABLET BY MOUTH EVERY DAY   finasteride 5 MG tablet Commonly known as:  PROSCAR Take 5 mg by mouth at bedtime.   glipiZIDE 5 MG tablet Commonly known as:  GLUCOTROL Take 5 mg by mouth daily.   latanoprost 0.005 % ophthalmic solution Commonly known as:  XALATAN Place 1 drop into both eyes at bedtime.   levETIRAcetam 500 MG tablet Commonly known as:  KEPPRA Take 1 tablet (500 mg total) by mouth 2 (two) times daily.   lovastatin 40 MG tablet Commonly known as:  MEVACOR Take 40 mg by mouth daily.   metFORMIN 1000 MG tablet Commonly known as:  GLUCOPHAGE Take 1,000 mg by mouth 2 (two) times daily with a meal.   tamsulosin 0.4 MG Caps capsule Commonly known as:  FLOMAX Take 1 capsule (0.4 mg total) by mouth daily.         DISCHARGE INSTRUCTIONS:   DIET:  Cardiac diet and Diabetic diet  DISCHARGE CONDITION:  Stable  ACTIVITY:  Activity as tolerated  OXYGEN:  Home Oxygen: No.   Oxygen  Delivery: room air  DISCHARGE LOCATION:  Home with Home health Nursing/PT.    If you experience worsening of your admission symptoms, develop shortness of breath, life threatening emergency, suicidal or homicidal thoughts you must seek medical attention immediately by calling 911 or calling your MD immediately  if symptoms less severe.  You Must read complete instructions/literature along with all the possible adverse reactions/side effects for all the Medicines you take and that have been prescribed to you. Take any new Medicines after you have completely understood and accpet all the possible adverse reactions/side effects.   Please note  You were cared for by a hospitalist during your hospital stay. If you have any questions about your discharge medications or the care you received while you were in the hospital after you are discharged, you can call the unit and asked to speak with the hospitalist on call if the hospitalist that took care of you is not available. Once you are discharged, your primary care physician will handle any further medical issues. Please note that NO REFILLS for any discharge medications will be authorized once you are discharged, as it is imperative that you return to your primary care physician (or establish a relationship with a primary care physician if you do not have one) for your aftercare needs so that they can reassess your need for medications and monitor your lab values.     Today   No further Seizures overnight.  No new complaints. No fever.  EEG (-). MRI (-) except old CVA's.   VITAL SIGNS:  Blood pressure (!) 151/60, pulse 91, temperature 98.2 F (36.8 C), temperature source Oral, resp. rate 20, height 6' (1.829 m), weight 86.6 kg (191 lb), SpO2 100 %.  I/O:   Intake/Output Summary (Last 24 hours) at 09/08/16 1311 Last data filed at 09/08/16 1028  Gross per 24 hour  Intake              480 ml  Output              400 ml  Net               80 ml     PHYSICAL EXAMINATION:  GENERAL:  83 y.o.-year-old patient lying in the bed with no acute distress.  EYES: Pupils equal, round, reactive to light and accommodation. No scleral icterus. Extraocular muscles intact.  HEENT: Head atraumatic, normocephalic. Oropharynx and nasopharynx clear.  NECK:  Supple, no jugular venous distention. No thyroid enlargement, no tenderness.  LUNGS: Normal breath sounds bilaterally, no wheezing, rales,rhonchi. No use of accessory muscles of respiration.  CARDIOVASCULAR: S1, S2 normal. No murmurs, rubs, or gallops.  ABDOMEN: Soft, non-tender, non-distended. Bowel sounds present. No organomegaly or mass.  EXTREMITIES: No pedal edema, cyanosis, or clubbing. Right Great Toe ulcer with no drainage.  Left foot lateral  scar from recent surgery.  NEUROLOGIC: Cranial nerves II through XII are intact. No focal motor or sensory defecits b/l.  PSYCHIATRIC: The patient is alert and oriented x 3.  SKIN: No obvious rash, lesion, or ulcer.   DATA REVIEW:   CBC  Recent Labs Lab 09/06/16 2326  WBC 5.6  HGB 11.6*  HCT 33.7*  PLT 252    Chemistries   Recent Labs Lab 09/07/16 1052  NA 140  K 4.4  CL 108  CO2 25  GLUCOSE 100*  BUN 14  CREATININE 0.99  CALCIUM 8.8*  MG 1.3*  AST 21  ALT 11*  ALKPHOS 53  BILITOT 0.5    Cardiac Enzymes  Recent Labs Lab 09/06/16 2326  TROPONINI <0.03     RADIOLOGY:  Ct Head Wo Contrast  Result Date: 09/06/2016 CLINICAL DATA:  Altered mental status.  Seizure. EXAM: CT HEAD WITHOUT CONTRAST TECHNIQUE: Contiguous axial images were obtained from the base of the skull through the vertex without intravenous contrast. COMPARISON:  Head CT 05/25/2016 FINDINGS: Brain: Stable degree of atrophy and chronic small vessel ischemia. No intracranial hemorrhage or evidence of acute infarct. No mass effect or midline shift. No subdural or extra-axial fluid collection. Vascular: Atherosclerosis of skullbase vasculature without hyperdense  vessel or abnormal calcification. Skull: No skull fracture.  No focal lesion. Sinuses/Orbits: Mucosal thickening of the right maxillary sinus is partially included. Minimal opacification of left mastoid air cells. Probable left cataract resection. Other: None. IMPRESSION: Stable atrophy and chronic small vessel ischemia. No evidence of acute intracranial abnormality. Electronically Signed   By: Jeb Levering M.D.   On: 09/06/2016 23:52   Mr Jeri Cos HB Contrast  Result Date: 09/07/2016 CLINICAL DATA:  Seizure EXAM: MRI HEAD WITHOUT AND WITH CONTRAST TECHNIQUE: Multiplanar, multiecho pulse sequences of the brain and surrounding structures were obtained without and with intravenous contrast. CONTRAST:  65mL MULTIHANCE GADOBENATE DIMEGLUMINE 529 MG/ML IV SOLN COMPARISON:  Head CT 09/06/2016 FINDINGS: Brain: The midline structures are normal. There is no focal diffusion restriction to indicate acute infarct. There are multiple small, old infarcts of the cerebellum and left pons. There is beginning confluent hyperintense T2-weighted signal within the periventricular white matter, most often seen in the setting of chronic microvascular ischemia. Foci of chronic microhemorrhage in the left frontal and right temporal lobes and in the medial right cerebellum. There is global atrophy without lobar predilection. The hippocampal structures are symmetric in size, signal and morphology. The hypothalamus and mamillary bodies are normal. The dura is normal and there is no extra-axial collection. Vascular: Major intracranial arterial and venous sinus flow voids are preserved. Skull and upper cervical spine: The visualized skull base, calvarium, upper cervical spine and extracranial soft tissues are normal. Sinuses/Orbits: No fluid levels or advanced mucosal thickening. No mastoid effusion. Normal orbits. IMPRESSION: 1. No acute intracranial abnormality or a etiology for seizure identified. 2. Multiple small, old infarcts of the  brainstem and cerebellum. Chronic microvascular ischemia. Electronically Signed   By: Ulyses Jarred M.D.   On: 09/07/2016 14:01   Dg Chest Port 1 View  Result Date: 09/06/2016 CLINICAL DATA:  Altered mental status.  Seizure. EXAM: PORTABLE CHEST 1 VIEW COMPARISON:  Radiographs and CT February 2018. Radiographs 11/19/2015 FINDINGS: Post median sternotomy. Unchanged heart size and mediastinal contours. There is atherosclerosis of the thoracic aorta. Persistent focal right upper lobe opacity since 2017. This is unchanged in radiographic appearance. No new focal airspace disease. No pleural fluid, pulmonary edema or pneumothorax. IMPRESSION: 1. Persistent  focal right upper lobe airspace opacities since August 2017. This remains suspicious for neoplasm, however is unchanged in radiographic appearance. 2. No new abnormality.  Thoracic aortic atherosclerosis. Electronically Signed   By: Jeb Levering M.D.   On: 09/06/2016 23:56      Management plans discussed with the patient, family and they are in agreement.  CODE STATUS:     Code Status Orders        Start     Ordered   09/07/16 0455  Full code  Continuous     09/07/16 0454        TOTAL TIME TAKING CARE OF THIS PATIENT: 40 minutes.    Henreitta Leber M.D on 09/08/2016 at 1:11 PM  Between 7am to 6pm - Pager - [phone]  After 6pm go to [email or web address] - Proofreader  [facility]  Office  [phone]  CC: Primary care physician; Madelyn Brunner, MD

## 2016-09-08 NOTE — Progress Notes (Signed)
Received MD order to discharge patient to home, reviewed  discharge instructions home meds and prescriptions with patient and family and all verbalized understanding

## 2016-09-08 NOTE — Care Management (Signed)
Discharge to home today per Dr. Heron Sabins. Voice mail left for [place] at [place]. Discharge summary and Home Health orders faxed to [phone]. Shelbie Ammons RN MSN CCM Care Management [phone]

## 2016-09-08 NOTE — Evaluation (Signed)
Physical Therapy Evaluation Patient Details Name: Matthew Bruce MRN: [id number] DOB: 12/19/1926 Today's Date: 09/08/2016   History of Present Illness  The patient with past medical history of diabetes, coronary artery disease status post CABG and aortic valve replacement, and hypertension presents to the emergency department following a seizure.  Two weeks ago he underwent fifth metatarsal amputation of the left foot and debridement of a first right toe ulcer. The patient has been in his usual state of health (relatively good given his chronic medical illnesses) and had visited his podiatrist earlier on the day of arrival when he suffered a potential seizure after supper tonight. Seizure activity was unwitnessed but he was found to be sonorous and difficult to arouse. EMS found his level of alertness to be improving but once he arrived in the emergency department, he had a tonic-clonic seizure lasting a few minutes that was aborted with Ativan 1 mg IV. The patient does not have a history of seizure disorder. The patient was loaded with Keppra in the emergency department and the hospitalist service was called for admission. He is now admitted for new onset seizures  Clinical Impression  Pt admitted with above diagnosis. Pt currently with functional limitations due to the deficits listed below (see PT Problem List). Pt with some generalized weakness. He requires increased time to perform bed mobility, HOB elevated, and bed rails but no external assistance from therapist. MinA+1 for sit to stand transfers from low commode. He is able to ambulate partially around RN station with CGA and rolling walker. Pt with some unsteadiness during ambulation but able to self correct with UE support on rolling walker. He is safe to return home with family and resume [place] PT. He should use rolling walker at all times for safety. Pt will benefit from skilled PT services to address deficits in strength, balance, and mobility  in order to return to full function at home.     Follow Up Recommendations Home health PT;Other (comment) (Resume HH PT at discharge)    Equipment Recommendations  Other (comment) (Must use his rolling walker at discharge)    Recommendations for Other Services       Precautions / Restrictions Precautions Precautions: Fall;Other (comment) (Seizure) Restrictions Weight Bearing Restrictions: No      Mobility  Bed Mobility Overal bed mobility: Modified Independent             General bed mobility comments: Heavy use of bed rails, HOB elevated. Moves very slowly but does not require external assist  Transfers Overall transfer level: Needs assistance Equipment used: Rolling walker (2 wheeled) Transfers: Sit to/from Stand Sit to Stand: Min assist         General transfer comment: Pt requires very minimal assist with sit to stand transfers from commode. He is able to perform without assist from elevated bed. Increased time required to perform with decreased LE power noted. Pt requires cues for safetyw ith toilet transfers  Ambulation/Gait Ambulation/Gait assistance: Min guard Ambulation Distance (Feet): 150 Feet Assistive device: Rolling walker (2 wheeled) Gait Pattern/deviations: Decreased step length - right;Decreased step length - left Gait velocity: Decreased Gait velocity interpretation: <1.8 ft/sec, indicative of risk for recurrent falls General Gait Details: Pt able to ambulate partially around RN station with rolling walker. Decreased step length during ambulation and pt requires frequent cues to remain within walker. Pt with some mild staggering but able to self correct with UE support on rolling walker  Stairs  Wheelchair Mobility    Modified Rankin (Stroke Patients Only)       Balance Overall balance assessment: Needs assistance Sitting-balance support: No upper extremity supported Sitting balance-Leahy Scale: Good     Standing balance  support: No upper extremity supported Standing balance-Leahy Scale: Fair Standing balance comment: Poor narrow stance balance. Able to maintain wide stance without UE support                             Pertinent Vitals/Pain Pain Assessment: No/denies pain    Home Living Family/patient expects to be discharged to:: Private residence Living Arrangements: Spouse/significant other Available Help at Discharge: Family Type of Home: House Home Access: Stairs to enter Entrance Stairs-Rails: Right;Left;Can reach both Entrance Stairs-Number of Steps: 4 Home Layout: One level Home Equipment: Cane - single point;Wheelchair - [employer] - 2 wheels;Shower seat;Grab bars - tub/shower      Prior Function Level of Independence: Needs assistance   Gait / Transfers Assistance Needed: Mod Independent for ambulation with spc vs RW in house and with limited community mobility. Family reports 1 fall in the last 12 months  ADL's / Homemaking Assistance Needed: Independent with ADLs, assist with IADLs        Hand Dominance   Dominant Hand: Right    Extremity/Trunk Assessment   Upper Extremity Assessment Upper Extremity Assessment: Overall WFL for tasks assessed    Lower Extremity Assessment Lower Extremity Assessment: Generalized weakness       Communication   Communication: HOH  Cognition Arousal/Alertness: Awake/alert Behavior During Therapy: WFL for tasks assessed/performed Overall Cognitive Status: Within Functional Limits for tasks assessed                                 General Comments: AOx3 at time of PT evaluation      General Comments      Exercises     Assessment/Plan    PT Assessment Patient needs continued PT services  PT Problem List Decreased strength;Decreased activity tolerance;Decreased balance;Decreased mobility;Decreased safety awareness       PT Treatment Interventions DME instruction;Gait training;Functional mobility  training;Stair training;Therapeutic activities;Therapeutic exercise;Balance training;Neuromuscular re-education;Patient/family education    PT Goals (Current goals can be found in the Care Plan section)  Acute Rehab PT Goals Patient Stated Goal: Return to prior function at home PT Goal Formulation: With patient/family Time For Goal Achievement: 09/22/16 Potential to Achieve Goals: Good    Frequency Min 2X/week   Barriers to discharge        Co-evaluation               AM-PAC PT "6 Clicks" Daily Activity  Outcome Measure Difficulty turning over in bed (including adjusting bedclothes, sheets and blankets)?: A Little Difficulty moving from lying on back to sitting on the side of the bed? : A Little Difficulty sitting down on and standing up from a chair with arms (e.g., wheelchair, bedside commode, etc,.)?: Total Help needed moving to and from a bed to chair (including a wheelchair)?: A Little Help needed walking in hospital room?: A Little Help needed climbing 3-5 steps with a railing? : A Lot 6 Click Score: 15    End of Session Equipment Utilized During Treatment: Gait belt Activity Tolerance: Patient tolerated treatment well Patient left: in bed;with call bell/phone within reach;with bed alarm set;with family/visitor present Nurse Communication: Mobility status PT Visit Diagnosis: Unsteadiness on feet (  R26.81);Muscle weakness (generalized) (M62.81)    Time: 8590-9311 PT Time Calculation (min) (ACUTE ONLY): 26 min   Charges:   PT Evaluation $PT Eval Low Complexity: 1 Procedure PT Treatments $Therapeutic Activity: 8-22 mins   PT G Codes:   PT G-Codes **NOT FOR INPATIENT CLASS** Functional Assessment Tool Used: AM-PAC 6 Clicks Basic Mobility Functional Limitation: Mobility: Walking and moving around Mobility: Walking and Moving Around Current Status (E1624): At least 40 percent but less than 60 percent impaired, limited or restricted Mobility: Walking and Moving  Around Goal Status [phone]): At least 20 percent but less than 40 percent impaired, limited or restricted    Phillips Grout PT, DPT    Latiffany Harwick 09/08/2016, 1:45 PM

## 2016-09-09 DIAGNOSIS — D649 Anemia, unspecified: Secondary | ICD-10-CM | POA: Diagnosis not present

## 2016-09-09 DIAGNOSIS — E1140 Type 2 diabetes mellitus with diabetic neuropathy, unspecified: Secondary | ICD-10-CM | POA: Diagnosis not present

## 2016-09-09 DIAGNOSIS — I2510 Atherosclerotic heart disease of native coronary artery without angina pectoris: Secondary | ICD-10-CM | POA: Diagnosis not present

## 2016-09-09 DIAGNOSIS — L97529 Non-pressure chronic ulcer of other part of left foot with unspecified severity: Secondary | ICD-10-CM | POA: Diagnosis not present

## 2016-09-09 DIAGNOSIS — L988 Other specified disorders of the skin and subcutaneous tissue: Secondary | ICD-10-CM | POA: Diagnosis not present

## 2016-09-09 DIAGNOSIS — T8781 Dehiscence of amputation stump: Secondary | ICD-10-CM | POA: Diagnosis not present

## 2016-09-09 DIAGNOSIS — E11621 Type 2 diabetes mellitus with foot ulcer: Secondary | ICD-10-CM | POA: Diagnosis not present

## 2016-09-09 DIAGNOSIS — I10 Essential (primary) hypertension: Secondary | ICD-10-CM | POA: Diagnosis not present

## 2016-09-09 DIAGNOSIS — E1151 Type 2 diabetes mellitus with diabetic peripheral angiopathy without gangrene: Secondary | ICD-10-CM | POA: Diagnosis not present

## 2016-09-16 DIAGNOSIS — L97511 Non-pressure chronic ulcer of other part of right foot limited to breakdown of skin: Secondary | ICD-10-CM | POA: Diagnosis not present

## 2016-09-16 DIAGNOSIS — E1151 Type 2 diabetes mellitus with diabetic peripheral angiopathy without gangrene: Secondary | ICD-10-CM | POA: Diagnosis not present

## 2016-09-16 DIAGNOSIS — I10 Essential (primary) hypertension: Secondary | ICD-10-CM | POA: Diagnosis not present

## 2016-09-16 DIAGNOSIS — E1140 Type 2 diabetes mellitus with diabetic neuropathy, unspecified: Secondary | ICD-10-CM | POA: Diagnosis not present

## 2016-09-16 DIAGNOSIS — R569 Unspecified convulsions: Secondary | ICD-10-CM | POA: Diagnosis not present

## 2016-09-16 DIAGNOSIS — I872 Venous insufficiency (chronic) (peripheral): Secondary | ICD-10-CM | POA: Diagnosis not present

## 2016-09-16 DIAGNOSIS — I2510 Atherosclerotic heart disease of native coronary artery without angina pectoris: Secondary | ICD-10-CM | POA: Diagnosis not present

## 2016-09-16 DIAGNOSIS — D649 Anemia, unspecified: Secondary | ICD-10-CM | POA: Diagnosis not present

## 2016-09-16 DIAGNOSIS — L089 Local infection of the skin and subcutaneous tissue, unspecified: Secondary | ICD-10-CM | POA: Diagnosis not present

## 2016-09-20 DIAGNOSIS — L97514 Non-pressure chronic ulcer of other part of right foot with necrosis of bone: Secondary | ICD-10-CM | POA: Diagnosis not present

## 2016-09-20 DIAGNOSIS — E1142 Type 2 diabetes mellitus with diabetic polyneuropathy: Secondary | ICD-10-CM | POA: Diagnosis not present

## 2016-09-21 DIAGNOSIS — L089 Local infection of the skin and subcutaneous tissue, unspecified: Secondary | ICD-10-CM | POA: Diagnosis not present

## 2016-09-21 DIAGNOSIS — R569 Unspecified convulsions: Secondary | ICD-10-CM | POA: Diagnosis not present

## 2016-09-21 DIAGNOSIS — L97511 Non-pressure chronic ulcer of other part of right foot limited to breakdown of skin: Secondary | ICD-10-CM | POA: Diagnosis not present

## 2016-09-21 DIAGNOSIS — I10 Essential (primary) hypertension: Secondary | ICD-10-CM | POA: Diagnosis not present

## 2016-09-21 DIAGNOSIS — D649 Anemia, unspecified: Secondary | ICD-10-CM | POA: Diagnosis not present

## 2016-09-21 DIAGNOSIS — I872 Venous insufficiency (chronic) (peripheral): Secondary | ICD-10-CM | POA: Diagnosis not present

## 2016-09-21 DIAGNOSIS — I2510 Atherosclerotic heart disease of native coronary artery without angina pectoris: Secondary | ICD-10-CM | POA: Diagnosis not present

## 2016-09-21 DIAGNOSIS — E1140 Type 2 diabetes mellitus with diabetic neuropathy, unspecified: Secondary | ICD-10-CM | POA: Diagnosis not present

## 2016-09-21 DIAGNOSIS — E1151 Type 2 diabetes mellitus with diabetic peripheral angiopathy without gangrene: Secondary | ICD-10-CM | POA: Diagnosis not present

## 2016-09-22 DIAGNOSIS — I2510 Atherosclerotic heart disease of native coronary artery without angina pectoris: Secondary | ICD-10-CM | POA: Diagnosis not present

## 2016-09-22 DIAGNOSIS — E1165 Type 2 diabetes mellitus with hyperglycemia: Secondary | ICD-10-CM | POA: Diagnosis not present

## 2016-09-22 DIAGNOSIS — I10 Essential (primary) hypertension: Secondary | ICD-10-CM | POA: Diagnosis not present

## 2016-09-22 DIAGNOSIS — R569 Unspecified convulsions: Secondary | ICD-10-CM | POA: Diagnosis not present

## 2016-09-23 DIAGNOSIS — E1140 Type 2 diabetes mellitus with diabetic neuropathy, unspecified: Secondary | ICD-10-CM | POA: Diagnosis not present

## 2016-09-23 DIAGNOSIS — E1151 Type 2 diabetes mellitus with diabetic peripheral angiopathy without gangrene: Secondary | ICD-10-CM | POA: Diagnosis not present

## 2016-09-23 DIAGNOSIS — L089 Local infection of the skin and subcutaneous tissue, unspecified: Secondary | ICD-10-CM | POA: Diagnosis not present

## 2016-09-23 DIAGNOSIS — R569 Unspecified convulsions: Secondary | ICD-10-CM | POA: Diagnosis not present

## 2016-09-23 DIAGNOSIS — I2510 Atherosclerotic heart disease of native coronary artery without angina pectoris: Secondary | ICD-10-CM | POA: Diagnosis not present

## 2016-09-23 DIAGNOSIS — I10 Essential (primary) hypertension: Secondary | ICD-10-CM | POA: Diagnosis not present

## 2016-09-23 DIAGNOSIS — D649 Anemia, unspecified: Secondary | ICD-10-CM | POA: Diagnosis not present

## 2016-09-23 DIAGNOSIS — I872 Venous insufficiency (chronic) (peripheral): Secondary | ICD-10-CM | POA: Diagnosis not present

## 2016-09-23 DIAGNOSIS — L97511 Non-pressure chronic ulcer of other part of right foot limited to breakdown of skin: Secondary | ICD-10-CM | POA: Diagnosis not present

## 2016-09-26 DIAGNOSIS — E1151 Type 2 diabetes mellitus with diabetic peripheral angiopathy without gangrene: Secondary | ICD-10-CM | POA: Diagnosis not present

## 2016-09-26 DIAGNOSIS — L089 Local infection of the skin and subcutaneous tissue, unspecified: Secondary | ICD-10-CM | POA: Diagnosis not present

## 2016-09-26 DIAGNOSIS — D649 Anemia, unspecified: Secondary | ICD-10-CM | POA: Diagnosis not present

## 2016-09-26 DIAGNOSIS — R569 Unspecified convulsions: Secondary | ICD-10-CM | POA: Diagnosis not present

## 2016-09-26 DIAGNOSIS — L97511 Non-pressure chronic ulcer of other part of right foot limited to breakdown of skin: Secondary | ICD-10-CM | POA: Diagnosis not present

## 2016-09-26 DIAGNOSIS — I10 Essential (primary) hypertension: Secondary | ICD-10-CM | POA: Diagnosis not present

## 2016-09-26 DIAGNOSIS — E1140 Type 2 diabetes mellitus with diabetic neuropathy, unspecified: Secondary | ICD-10-CM | POA: Diagnosis not present

## 2016-09-26 DIAGNOSIS — I872 Venous insufficiency (chronic) (peripheral): Secondary | ICD-10-CM | POA: Diagnosis not present

## 2016-09-26 DIAGNOSIS — I2510 Atherosclerotic heart disease of native coronary artery without angina pectoris: Secondary | ICD-10-CM | POA: Diagnosis not present

## 2016-09-28 ENCOUNTER — Ambulatory Visit (INDEPENDENT_AMBULATORY_CARE_PROVIDER_SITE_OTHER): Payer: Medicare HMO | Admitting: Vascular Surgery

## 2016-09-28 ENCOUNTER — Ambulatory Visit (INDEPENDENT_AMBULATORY_CARE_PROVIDER_SITE_OTHER): Payer: Medicare HMO

## 2016-09-28 ENCOUNTER — Encounter (INDEPENDENT_AMBULATORY_CARE_PROVIDER_SITE_OTHER): Payer: Self-pay | Admitting: Vascular Surgery

## 2016-09-28 VITALS — BP 120/71 | HR 81 | Resp 16 | Wt 180.0 lb

## 2016-09-28 DIAGNOSIS — I739 Peripheral vascular disease, unspecified: Secondary | ICD-10-CM | POA: Diagnosis not present

## 2016-09-28 DIAGNOSIS — I10 Essential (primary) hypertension: Secondary | ICD-10-CM

## 2016-09-28 DIAGNOSIS — E1152 Type 2 diabetes mellitus with diabetic peripheral angiopathy with gangrene: Secondary | ICD-10-CM | POA: Diagnosis not present

## 2016-09-28 NOTE — Progress Notes (Signed)
Subjective:    Patient ID: Matthew Bruce, male    DOB: [birth year], 83 y.o.   MRN: [id number] Chief Complaint  Patient presents with  . Re-evaluation   Patient presents for a six-month peripheral artery disease follow-up. He presents today without complaint. He denies any claudication, rest pain or ulceration to his lower extremity. He presents stating improvement to the right toe wound. This is being followed by podiatry. The patient underwent a lower extremity bilateral ABI which was notable for medial calcification and collateral circulation. Previous ABI on 06/29/2016: Right 0.62 and left 0.89 there is no significant change in bilateral ankle brachial indices when compared to the previous exam. The patient underwent a bilateral arterial duplex which was notable for right: Triphasic with patent stent transitioning to biphasic distal to the popliteal artery. Left: Reduced velocities which are below and flow of the left common femoral artery most likely due to proximal atherosclerotic disease. There is no previous arterial duplex for comparison. Denies any fever, nausea or vomiting.   Review of Systems  Constitutional: Negative.   HENT: Negative.   Eyes: Negative.   Respiratory: Negative.   Cardiovascular: Negative.   Gastrointestinal: Negative.   Endocrine: Negative.   Genitourinary: Negative.   Musculoskeletal: Negative.   Skin: Positive for wound.  Allergic/Immunologic: Negative.   Neurological: Negative.   Hematological: Negative.   Psychiatric/Behavioral: Negative.       Objective:   Physical Exam  Constitutional: He is oriented to person, place, and time. He appears well-developed and well-nourished. No distress.  HENT:  Head: Normocephalic and atraumatic.  Eyes: Conjunctivae are normal. Pupils are equal, round, and reactive to light.  Neck: Normal range of motion.  Cardiovascular: Normal rate, regular rhythm, normal heart sounds and intact distal pulses.   Pulses:    Radial pulses are 2+ on the right side, and 2+ on the left side.  Unable to palpate pedal pulses however his bilateral feet are warm  Pulmonary/Chest: Effort normal.  Musculoskeletal: Normal range of motion. He exhibits edema (Mild bilateral edema noted).  Neurological: He is alert and oriented to person, place, and time.  Skin: He is not diaphoretic.     Right foot: Wound with signs of healing noted.  Psychiatric: He has a normal mood and affect. His behavior is normal. Judgment and thought content normal.  Vitals reviewed.  BP 120/71   Pulse 81   Resp 16   Wt 180 lb (81.6 kg)   BMI 24.41 kg/m   Past Medical History:  Diagnosis Date  . Anemia   . Aortic valve disorder   . Coronary artery disease   . Diabetes mellitus without complication ([place])   . Glaucoma   . History of BPH   . History of kidney stones   . HOH (hard of hearing)    Bilateral Hearing Aids  . Hyperlipidemia   . Hypertension   . Kidney stones   . MRSA (methicillin resistant staph aureus) culture positive 04/01/2016   LEFT FOOT  . Myocardial infarction ([place])   . Neuropathy   . Peripheral vascular disease [employer])    Social History   Social History  . Marital status: Married    Spouse name: N/A  . Number of children: N/A  . Years of education: N/A   Occupational History  . Not on file.   Social History Main Topics  . Smoking status: Former Smoker    Packs/day: 1.00    Types: Cigarettes  . Smokeless tobacco: Current User  Types: Chew  . Alcohol use No  . Drug use: No  . Sexual activity: No   Other Topics Concern  . Not on file   Social History Narrative  . No narrative on file   Past Surgical History:  Procedure Laterality Date  . AMPUTATION TOE Left 02/11/2016   Procedure: AMPUTATION TOE;  Surgeon: Albertine Patricia, DPM;  Location: [facility];  Service: Podiatry;  Laterality: Left;  . AMPUTATION TOE Left 08/19/2016   Procedure: AMPUTATION TOE;  Surgeon: Albertine Patricia, DPM;  Location:  [facility];  Service: Podiatry;  Laterality: Left;  . APLIGRAFT PLACEMENT Right 08/19/2016   Procedure: APLIGRAFT PLACEMENT;  Surgeon: Albertine Patricia, DPM;  Location: [facility];  Service: Podiatry;  Laterality: Right;  . APPLICATION OF WOUND VAC Left 04/01/2016   Procedure: APPLICATION OF WOUND VAC;  Surgeon: Albertine Patricia, DPM;  Location: [facility];  Service: Podiatry;  Laterality: Left;  . CARDIAC CATHETERIZATION    . CARDIAC SURGERY    . CARDIAC VALVE REPLACEMENT    . CORONARY ARTERY BYPASS GRAFT    . INCISION AND DRAINAGE Right 08/19/2016   Procedure: IRRIGATION AND DEBRIDEMENT RIGHT GREAT TOE;  Surgeon: Albertine Patricia, DPM;  Location: [facility];  Service: Podiatry;  Laterality: Right;  . IRRIGATION AND DEBRIDEMENT FOOT Left 04/01/2016   Procedure: IRRIGATION AND DEBRIDEMENT FOOT;  Surgeon: Albertine Patricia, DPM;  Location: [facility];  Service: Podiatry;  Laterality: Left;  . IRRIGATION AND DEBRIDEMENT FOOT Left 05/13/2016   Procedure: IRRIGATION AND DEBRIDEMENT FOOT;  Surgeon: Albertine Patricia, DPM;  Location: [facility];  Service: Podiatry;  Laterality: Left;  . LOWER EXTREMITY ANGIOGRAPHY Right 07/06/2016   Procedure: Lower Extremity Angiography;  Surgeon: Algernon Huxley, MD;  Location: [place] [facility];  Service: Cardiovascular;  Laterality: Right;  . LOWER EXTREMITY ANGIOGRAPHY Right 07/18/2016   Procedure: Lower Extremity Angiography;  Surgeon: Algernon Huxley, MD;  Location: [place] [facility];  Service: Cardiovascular;  Laterality: Right;  . OSTECTOMY Left 05/13/2016   Procedure: OSTECTOMY;  Surgeon: Albertine Patricia, DPM;  Location: [facility];  Service: Podiatry;  Laterality: Left;  . PERIPHERAL VASCULAR CATHETERIZATION Left 02/03/2016   Procedure: Lower Extremity Angiography;  Surgeon: Algernon Huxley, MD;  Location: [place] [facility];  Service: Cardiovascular;  Laterality: Left;  . PERIPHERAL VASCULAR CATHETERIZATION Left 04/01/2016   Procedure: Lower Extremity Angiography;  Surgeon: Katha Cabal, MD;  Location: [facility] [facility];  Service: Cardiovascular;  Laterality: Left;  Marland Kitchen VALVE REPLACEMENT  2007   Aortic Valve Replacement, St. Jude Porcine Valve   Family History  Problem Relation Age of Onset  . Heart attack Mother   . Alcohol abuse Father   . Throat cancer Sister   . Heart attack Brother   . Heart failure Neg Hx    Allergies  Allergen Reactions  . Sulfa Antibiotics Other (See Comments)    Reaction: Severe Shaking, Weakness      Assessment & Plan:  Patient presents for a six-month peripheral artery disease follow-up. He presents today without complaint. He denies any claudication, rest pain or ulceration to his lower extremity. He presents stating improvement to the right toe wound. This is being followed by podiatry. The patient underwent a lower extremity bilateral ABI which was notable for medial calcification and collateral circulation. Previous ABI on 06/29/2016: Right 0.62 and left 0.89 there is no significant change in bilateral ankle brachial indices when compared to the previous exam. The patient underwent a bilateral arterial duplex which was  notable for right: Triphasic with patent stent transitioning to biphasic distal to the popliteal artery. Left: Reduced velocities which are below and flow of the left common femoral artery most likely due to proximal atherosclerotic disease. There is no previous arterial duplex for comparison. Denies any fever, nausea or vomiting.  1. PAD (peripheral artery disease) ([place]) - Stable Patient presents without complaint today Patient showing signs of healing to right toe wound ABI is stable. Duplex notable for disease in both lower extremities. Patient seems to be doing well there is no indication at this time for any endovascular intervention We'll bring the patient back in 6 months for an ABI and a bilateral lower arterial duplex to assess anatomy/degree of PAD If patient starts to appearance any symptoms he is to call  the office sooner. He expresses understanding  - VAS [place] ABI WITH/WO TBI; Future - VAS [place] LOWER EXTREMITY ARTERIAL DUPLEX; Future  2. Type 2 diabetes mellitus with diabetic peripheral angiopathy with gangrene, unspecified whether long term insulin use (HCC) - Stable Encouraged good control as its slows the progression of atherosclerotic disease  3. Essential hypertension - stable Encouraged good control as its slows the progression of atherosclerotic disease  Current Outpatient Prescriptions on File Prior to Visit  Medication Sig Dispense Refill  . amLODipine (NORVASC) 5 MG tablet Take 5 mg by mouth 2 (two) times daily.  3  . clopidogrel (PLAVIX) 75 MG tablet TAKE 1 TABLET BY MOUTH EVERY DAY 30 tablet 3  . finasteride (PROSCAR) 5 MG tablet Take 5 mg by mouth at bedtime.     Marland Kitchen glipiZIDE (GLUCOTROL) 5 MG tablet Take 5 mg by mouth daily.  5  . latanoprost (XALATAN) 0.005 % ophthalmic solution Place 1 drop into both eyes at bedtime.    . levETIRAcetam (KEPPRA) 500 MG tablet Take 1 tablet (500 mg total) by mouth 2 (two) times daily. 60 tablet 1  . lovastatin (MEVACOR) 40 MG tablet Take 40 mg by mouth daily.     . metFORMIN (GLUCOPHAGE) 1000 MG tablet Take 1,000 mg by mouth 2 (two) times daily with a meal.    . tamsulosin (FLOMAX) 0.4 MG CAPS capsule Take 1 capsule (0.4 mg total) by mouth daily. 30 capsule    No current facility-administered medications on file prior to visit.     There are no Patient Instructions on file for this visit. No Follow-up on file.   Avion Kutzer A Shavy Beachem, PA-C

## 2016-09-29 DIAGNOSIS — I10 Essential (primary) hypertension: Secondary | ICD-10-CM | POA: Diagnosis not present

## 2016-09-29 DIAGNOSIS — I872 Venous insufficiency (chronic) (peripheral): Secondary | ICD-10-CM | POA: Diagnosis not present

## 2016-09-29 DIAGNOSIS — D649 Anemia, unspecified: Secondary | ICD-10-CM | POA: Diagnosis not present

## 2016-09-29 DIAGNOSIS — E1151 Type 2 diabetes mellitus with diabetic peripheral angiopathy without gangrene: Secondary | ICD-10-CM | POA: Diagnosis not present

## 2016-09-29 DIAGNOSIS — E1140 Type 2 diabetes mellitus with diabetic neuropathy, unspecified: Secondary | ICD-10-CM | POA: Diagnosis not present

## 2016-09-29 DIAGNOSIS — R569 Unspecified convulsions: Secondary | ICD-10-CM | POA: Diagnosis not present

## 2016-09-29 DIAGNOSIS — I2510 Atherosclerotic heart disease of native coronary artery without angina pectoris: Secondary | ICD-10-CM | POA: Diagnosis not present

## 2016-09-29 DIAGNOSIS — L97511 Non-pressure chronic ulcer of other part of right foot limited to breakdown of skin: Secondary | ICD-10-CM | POA: Diagnosis not present

## 2016-09-29 DIAGNOSIS — L089 Local infection of the skin and subcutaneous tissue, unspecified: Secondary | ICD-10-CM | POA: Diagnosis not present

## 2016-10-03 DIAGNOSIS — L089 Local infection of the skin and subcutaneous tissue, unspecified: Secondary | ICD-10-CM | POA: Diagnosis not present

## 2016-10-03 DIAGNOSIS — I2510 Atherosclerotic heart disease of native coronary artery without angina pectoris: Secondary | ICD-10-CM | POA: Diagnosis not present

## 2016-10-03 DIAGNOSIS — I10 Essential (primary) hypertension: Secondary | ICD-10-CM | POA: Diagnosis not present

## 2016-10-03 DIAGNOSIS — I872 Venous insufficiency (chronic) (peripheral): Secondary | ICD-10-CM | POA: Diagnosis not present

## 2016-10-03 DIAGNOSIS — D649 Anemia, unspecified: Secondary | ICD-10-CM | POA: Diagnosis not present

## 2016-10-03 DIAGNOSIS — L97511 Non-pressure chronic ulcer of other part of right foot limited to breakdown of skin: Secondary | ICD-10-CM | POA: Diagnosis not present

## 2016-10-03 DIAGNOSIS — R569 Unspecified convulsions: Secondary | ICD-10-CM | POA: Diagnosis not present

## 2016-10-03 DIAGNOSIS — E1151 Type 2 diabetes mellitus with diabetic peripheral angiopathy without gangrene: Secondary | ICD-10-CM | POA: Diagnosis not present

## 2016-10-03 DIAGNOSIS — E1140 Type 2 diabetes mellitus with diabetic neuropathy, unspecified: Secondary | ICD-10-CM | POA: Diagnosis not present

## 2016-10-04 DIAGNOSIS — I10 Essential (primary) hypertension: Secondary | ICD-10-CM | POA: Diagnosis not present

## 2016-10-04 DIAGNOSIS — I872 Venous insufficiency (chronic) (peripheral): Secondary | ICD-10-CM | POA: Diagnosis not present

## 2016-10-04 DIAGNOSIS — I2510 Atherosclerotic heart disease of native coronary artery without angina pectoris: Secondary | ICD-10-CM | POA: Diagnosis not present

## 2016-10-04 DIAGNOSIS — L97511 Non-pressure chronic ulcer of other part of right foot limited to breakdown of skin: Secondary | ICD-10-CM | POA: Diagnosis not present

## 2016-10-04 DIAGNOSIS — E1140 Type 2 diabetes mellitus with diabetic neuropathy, unspecified: Secondary | ICD-10-CM | POA: Diagnosis not present

## 2016-10-04 DIAGNOSIS — R569 Unspecified convulsions: Secondary | ICD-10-CM | POA: Diagnosis not present

## 2016-10-04 DIAGNOSIS — E1151 Type 2 diabetes mellitus with diabetic peripheral angiopathy without gangrene: Secondary | ICD-10-CM | POA: Diagnosis not present

## 2016-10-04 DIAGNOSIS — L089 Local infection of the skin and subcutaneous tissue, unspecified: Secondary | ICD-10-CM | POA: Diagnosis not present

## 2016-10-04 DIAGNOSIS — D649 Anemia, unspecified: Secondary | ICD-10-CM | POA: Diagnosis not present

## 2016-10-06 DIAGNOSIS — L97511 Non-pressure chronic ulcer of other part of right foot limited to breakdown of skin: Secondary | ICD-10-CM | POA: Diagnosis not present

## 2016-10-06 DIAGNOSIS — D649 Anemia, unspecified: Secondary | ICD-10-CM | POA: Diagnosis not present

## 2016-10-06 DIAGNOSIS — R569 Unspecified convulsions: Secondary | ICD-10-CM | POA: Diagnosis not present

## 2016-10-06 DIAGNOSIS — E1151 Type 2 diabetes mellitus with diabetic peripheral angiopathy without gangrene: Secondary | ICD-10-CM | POA: Diagnosis not present

## 2016-10-06 DIAGNOSIS — E1140 Type 2 diabetes mellitus with diabetic neuropathy, unspecified: Secondary | ICD-10-CM | POA: Diagnosis not present

## 2016-10-06 DIAGNOSIS — I10 Essential (primary) hypertension: Secondary | ICD-10-CM | POA: Diagnosis not present

## 2016-10-06 DIAGNOSIS — I872 Venous insufficiency (chronic) (peripheral): Secondary | ICD-10-CM | POA: Diagnosis not present

## 2016-10-06 DIAGNOSIS — I2510 Atherosclerotic heart disease of native coronary artery without angina pectoris: Secondary | ICD-10-CM | POA: Diagnosis not present

## 2016-10-06 DIAGNOSIS — L089 Local infection of the skin and subcutaneous tissue, unspecified: Secondary | ICD-10-CM | POA: Diagnosis not present

## 2016-10-11 DIAGNOSIS — L97514 Non-pressure chronic ulcer of other part of right foot with necrosis of bone: Secondary | ICD-10-CM | POA: Diagnosis not present

## 2016-10-11 DIAGNOSIS — E1142 Type 2 diabetes mellitus with diabetic polyneuropathy: Secondary | ICD-10-CM | POA: Diagnosis not present

## 2016-10-12 DIAGNOSIS — I10 Essential (primary) hypertension: Secondary | ICD-10-CM | POA: Diagnosis not present

## 2016-10-12 DIAGNOSIS — I872 Venous insufficiency (chronic) (peripheral): Secondary | ICD-10-CM | POA: Diagnosis not present

## 2016-10-12 DIAGNOSIS — E1140 Type 2 diabetes mellitus with diabetic neuropathy, unspecified: Secondary | ICD-10-CM | POA: Diagnosis not present

## 2016-10-12 DIAGNOSIS — R569 Unspecified convulsions: Secondary | ICD-10-CM | POA: Diagnosis not present

## 2016-10-12 DIAGNOSIS — E1151 Type 2 diabetes mellitus with diabetic peripheral angiopathy without gangrene: Secondary | ICD-10-CM | POA: Diagnosis not present

## 2016-10-12 DIAGNOSIS — H40153 Residual stage of open-angle glaucoma, bilateral: Secondary | ICD-10-CM | POA: Diagnosis not present

## 2016-10-12 DIAGNOSIS — I2510 Atherosclerotic heart disease of native coronary artery without angina pectoris: Secondary | ICD-10-CM | POA: Diagnosis not present

## 2016-10-12 DIAGNOSIS — L089 Local infection of the skin and subcutaneous tissue, unspecified: Secondary | ICD-10-CM | POA: Diagnosis not present

## 2016-10-12 DIAGNOSIS — D649 Anemia, unspecified: Secondary | ICD-10-CM | POA: Diagnosis not present

## 2016-10-12 DIAGNOSIS — L97511 Non-pressure chronic ulcer of other part of right foot limited to breakdown of skin: Secondary | ICD-10-CM | POA: Diagnosis not present

## 2016-10-13 DIAGNOSIS — L97511 Non-pressure chronic ulcer of other part of right foot limited to breakdown of skin: Secondary | ICD-10-CM | POA: Diagnosis not present

## 2016-10-13 DIAGNOSIS — I872 Venous insufficiency (chronic) (peripheral): Secondary | ICD-10-CM | POA: Diagnosis not present

## 2016-10-13 DIAGNOSIS — R569 Unspecified convulsions: Secondary | ICD-10-CM | POA: Diagnosis not present

## 2016-10-13 DIAGNOSIS — I2510 Atherosclerotic heart disease of native coronary artery without angina pectoris: Secondary | ICD-10-CM | POA: Diagnosis not present

## 2016-10-13 DIAGNOSIS — E1151 Type 2 diabetes mellitus with diabetic peripheral angiopathy without gangrene: Secondary | ICD-10-CM | POA: Diagnosis not present

## 2016-10-13 DIAGNOSIS — I10 Essential (primary) hypertension: Secondary | ICD-10-CM | POA: Diagnosis not present

## 2016-10-13 DIAGNOSIS — L089 Local infection of the skin and subcutaneous tissue, unspecified: Secondary | ICD-10-CM | POA: Diagnosis not present

## 2016-10-13 DIAGNOSIS — E1140 Type 2 diabetes mellitus with diabetic neuropathy, unspecified: Secondary | ICD-10-CM | POA: Diagnosis not present

## 2016-10-13 DIAGNOSIS — D649 Anemia, unspecified: Secondary | ICD-10-CM | POA: Diagnosis not present

## 2016-10-17 DIAGNOSIS — D649 Anemia, unspecified: Secondary | ICD-10-CM | POA: Diagnosis not present

## 2016-10-17 DIAGNOSIS — I2510 Atherosclerotic heart disease of native coronary artery without angina pectoris: Secondary | ICD-10-CM | POA: Diagnosis not present

## 2016-10-17 DIAGNOSIS — R569 Unspecified convulsions: Secondary | ICD-10-CM | POA: Diagnosis not present

## 2016-10-17 DIAGNOSIS — E1140 Type 2 diabetes mellitus with diabetic neuropathy, unspecified: Secondary | ICD-10-CM | POA: Diagnosis not present

## 2016-10-17 DIAGNOSIS — L97511 Non-pressure chronic ulcer of other part of right foot limited to breakdown of skin: Secondary | ICD-10-CM | POA: Diagnosis not present

## 2016-10-17 DIAGNOSIS — E1151 Type 2 diabetes mellitus with diabetic peripheral angiopathy without gangrene: Secondary | ICD-10-CM | POA: Diagnosis not present

## 2016-10-17 DIAGNOSIS — L089 Local infection of the skin and subcutaneous tissue, unspecified: Secondary | ICD-10-CM | POA: Diagnosis not present

## 2016-10-17 DIAGNOSIS — I872 Venous insufficiency (chronic) (peripheral): Secondary | ICD-10-CM | POA: Diagnosis not present

## 2016-10-17 DIAGNOSIS — I10 Essential (primary) hypertension: Secondary | ICD-10-CM | POA: Diagnosis not present

## 2016-10-24 DIAGNOSIS — E1151 Type 2 diabetes mellitus with diabetic peripheral angiopathy without gangrene: Secondary | ICD-10-CM | POA: Diagnosis not present

## 2016-10-24 DIAGNOSIS — I2510 Atherosclerotic heart disease of native coronary artery without angina pectoris: Secondary | ICD-10-CM | POA: Diagnosis not present

## 2016-10-24 DIAGNOSIS — L089 Local infection of the skin and subcutaneous tissue, unspecified: Secondary | ICD-10-CM | POA: Diagnosis not present

## 2016-10-24 DIAGNOSIS — D649 Anemia, unspecified: Secondary | ICD-10-CM | POA: Diagnosis not present

## 2016-10-24 DIAGNOSIS — I10 Essential (primary) hypertension: Secondary | ICD-10-CM | POA: Diagnosis not present

## 2016-10-24 DIAGNOSIS — I872 Venous insufficiency (chronic) (peripheral): Secondary | ICD-10-CM | POA: Diagnosis not present

## 2016-10-24 DIAGNOSIS — L97511 Non-pressure chronic ulcer of other part of right foot limited to breakdown of skin: Secondary | ICD-10-CM | POA: Diagnosis not present

## 2016-10-24 DIAGNOSIS — E1140 Type 2 diabetes mellitus with diabetic neuropathy, unspecified: Secondary | ICD-10-CM | POA: Diagnosis not present

## 2016-10-24 DIAGNOSIS — R569 Unspecified convulsions: Secondary | ICD-10-CM | POA: Diagnosis not present

## 2016-11-01 ENCOUNTER — Other Ambulatory Visit (INDEPENDENT_AMBULATORY_CARE_PROVIDER_SITE_OTHER): Payer: Self-pay | Admitting: Vascular Surgery

## 2016-11-01 DIAGNOSIS — E1142 Type 2 diabetes mellitus with diabetic polyneuropathy: Secondary | ICD-10-CM | POA: Diagnosis not present

## 2016-11-01 DIAGNOSIS — L97512 Non-pressure chronic ulcer of other part of right foot with fat layer exposed: Secondary | ICD-10-CM | POA: Diagnosis not present

## 2016-11-02 DIAGNOSIS — D649 Anemia, unspecified: Secondary | ICD-10-CM | POA: Diagnosis not present

## 2016-11-02 DIAGNOSIS — I2510 Atherosclerotic heart disease of native coronary artery without angina pectoris: Secondary | ICD-10-CM | POA: Diagnosis not present

## 2016-11-02 DIAGNOSIS — R569 Unspecified convulsions: Secondary | ICD-10-CM | POA: Diagnosis not present

## 2016-11-02 DIAGNOSIS — I10 Essential (primary) hypertension: Secondary | ICD-10-CM | POA: Diagnosis not present

## 2016-11-02 DIAGNOSIS — L089 Local infection of the skin and subcutaneous tissue, unspecified: Secondary | ICD-10-CM | POA: Diagnosis not present

## 2016-11-02 DIAGNOSIS — L97511 Non-pressure chronic ulcer of other part of right foot limited to breakdown of skin: Secondary | ICD-10-CM | POA: Diagnosis not present

## 2016-11-02 DIAGNOSIS — I872 Venous insufficiency (chronic) (peripheral): Secondary | ICD-10-CM | POA: Diagnosis not present

## 2016-11-02 DIAGNOSIS — E1140 Type 2 diabetes mellitus with diabetic neuropathy, unspecified: Secondary | ICD-10-CM | POA: Diagnosis not present

## 2016-11-02 DIAGNOSIS — E1151 Type 2 diabetes mellitus with diabetic peripheral angiopathy without gangrene: Secondary | ICD-10-CM | POA: Diagnosis not present

## 2016-11-07 DIAGNOSIS — L97511 Non-pressure chronic ulcer of other part of right foot limited to breakdown of skin: Secondary | ICD-10-CM | POA: Diagnosis not present

## 2016-11-07 DIAGNOSIS — I872 Venous insufficiency (chronic) (peripheral): Secondary | ICD-10-CM | POA: Diagnosis not present

## 2016-11-07 DIAGNOSIS — I10 Essential (primary) hypertension: Secondary | ICD-10-CM | POA: Diagnosis not present

## 2016-11-07 DIAGNOSIS — L089 Local infection of the skin and subcutaneous tissue, unspecified: Secondary | ICD-10-CM | POA: Diagnosis not present

## 2016-11-07 DIAGNOSIS — E1140 Type 2 diabetes mellitus with diabetic neuropathy, unspecified: Secondary | ICD-10-CM | POA: Diagnosis not present

## 2016-11-07 DIAGNOSIS — I2510 Atherosclerotic heart disease of native coronary artery without angina pectoris: Secondary | ICD-10-CM | POA: Diagnosis not present

## 2016-11-07 DIAGNOSIS — E1151 Type 2 diabetes mellitus with diabetic peripheral angiopathy without gangrene: Secondary | ICD-10-CM | POA: Diagnosis not present

## 2016-11-07 DIAGNOSIS — D649 Anemia, unspecified: Secondary | ICD-10-CM | POA: Diagnosis not present

## 2016-11-07 DIAGNOSIS — R569 Unspecified convulsions: Secondary | ICD-10-CM | POA: Diagnosis not present

## 2016-11-15 DIAGNOSIS — I872 Venous insufficiency (chronic) (peripheral): Secondary | ICD-10-CM | POA: Diagnosis not present

## 2016-11-15 DIAGNOSIS — L97521 Non-pressure chronic ulcer of other part of left foot limited to breakdown of skin: Secondary | ICD-10-CM | POA: Diagnosis not present

## 2016-11-15 DIAGNOSIS — I2510 Atherosclerotic heart disease of native coronary artery without angina pectoris: Secondary | ICD-10-CM | POA: Diagnosis not present

## 2016-11-15 DIAGNOSIS — L97511 Non-pressure chronic ulcer of other part of right foot limited to breakdown of skin: Secondary | ICD-10-CM | POA: Diagnosis not present

## 2016-11-15 DIAGNOSIS — R569 Unspecified convulsions: Secondary | ICD-10-CM | POA: Diagnosis not present

## 2016-11-15 DIAGNOSIS — E1140 Type 2 diabetes mellitus with diabetic neuropathy, unspecified: Secondary | ICD-10-CM | POA: Diagnosis not present

## 2016-11-15 DIAGNOSIS — E1151 Type 2 diabetes mellitus with diabetic peripheral angiopathy without gangrene: Secondary | ICD-10-CM | POA: Diagnosis not present

## 2016-11-15 DIAGNOSIS — I10 Essential (primary) hypertension: Secondary | ICD-10-CM | POA: Diagnosis not present

## 2016-11-15 DIAGNOSIS — D649 Anemia, unspecified: Secondary | ICD-10-CM | POA: Diagnosis not present

## 2016-11-24 DIAGNOSIS — E1140 Type 2 diabetes mellitus with diabetic neuropathy, unspecified: Secondary | ICD-10-CM | POA: Diagnosis not present

## 2016-11-24 DIAGNOSIS — L97521 Non-pressure chronic ulcer of other part of left foot limited to breakdown of skin: Secondary | ICD-10-CM | POA: Diagnosis not present

## 2016-11-24 DIAGNOSIS — I2510 Atherosclerotic heart disease of native coronary artery without angina pectoris: Secondary | ICD-10-CM | POA: Diagnosis not present

## 2016-11-24 DIAGNOSIS — I872 Venous insufficiency (chronic) (peripheral): Secondary | ICD-10-CM | POA: Diagnosis not present

## 2016-11-24 DIAGNOSIS — L97511 Non-pressure chronic ulcer of other part of right foot limited to breakdown of skin: Secondary | ICD-10-CM | POA: Diagnosis not present

## 2016-11-24 DIAGNOSIS — R569 Unspecified convulsions: Secondary | ICD-10-CM | POA: Diagnosis not present

## 2016-11-24 DIAGNOSIS — E1151 Type 2 diabetes mellitus with diabetic peripheral angiopathy without gangrene: Secondary | ICD-10-CM | POA: Diagnosis not present

## 2016-11-24 DIAGNOSIS — I10 Essential (primary) hypertension: Secondary | ICD-10-CM | POA: Diagnosis not present

## 2016-11-24 DIAGNOSIS — D649 Anemia, unspecified: Secondary | ICD-10-CM | POA: Diagnosis not present

## 2016-11-29 DIAGNOSIS — E1151 Type 2 diabetes mellitus with diabetic peripheral angiopathy without gangrene: Secondary | ICD-10-CM | POA: Diagnosis not present

## 2016-11-29 DIAGNOSIS — R569 Unspecified convulsions: Secondary | ICD-10-CM | POA: Diagnosis not present

## 2016-11-29 DIAGNOSIS — I872 Venous insufficiency (chronic) (peripheral): Secondary | ICD-10-CM | POA: Diagnosis not present

## 2016-11-29 DIAGNOSIS — L97511 Non-pressure chronic ulcer of other part of right foot limited to breakdown of skin: Secondary | ICD-10-CM | POA: Diagnosis not present

## 2016-11-29 DIAGNOSIS — L97521 Non-pressure chronic ulcer of other part of left foot limited to breakdown of skin: Secondary | ICD-10-CM | POA: Diagnosis not present

## 2016-11-30 DIAGNOSIS — I10 Essential (primary) hypertension: Secondary | ICD-10-CM | POA: Diagnosis not present

## 2016-11-30 DIAGNOSIS — D649 Anemia, unspecified: Secondary | ICD-10-CM | POA: Diagnosis not present

## 2016-11-30 DIAGNOSIS — L97511 Non-pressure chronic ulcer of other part of right foot limited to breakdown of skin: Secondary | ICD-10-CM | POA: Diagnosis not present

## 2016-11-30 DIAGNOSIS — I872 Venous insufficiency (chronic) (peripheral): Secondary | ICD-10-CM | POA: Diagnosis not present

## 2016-11-30 DIAGNOSIS — I2510 Atherosclerotic heart disease of native coronary artery without angina pectoris: Secondary | ICD-10-CM | POA: Diagnosis not present

## 2016-11-30 DIAGNOSIS — E1140 Type 2 diabetes mellitus with diabetic neuropathy, unspecified: Secondary | ICD-10-CM | POA: Diagnosis not present

## 2016-11-30 DIAGNOSIS — E1151 Type 2 diabetes mellitus with diabetic peripheral angiopathy without gangrene: Secondary | ICD-10-CM | POA: Diagnosis not present

## 2016-11-30 DIAGNOSIS — L97521 Non-pressure chronic ulcer of other part of left foot limited to breakdown of skin: Secondary | ICD-10-CM | POA: Diagnosis not present

## 2016-11-30 DIAGNOSIS — R569 Unspecified convulsions: Secondary | ICD-10-CM | POA: Diagnosis not present

## 2016-12-06 DIAGNOSIS — E1142 Type 2 diabetes mellitus with diabetic polyneuropathy: Secondary | ICD-10-CM | POA: Diagnosis not present

## 2016-12-06 DIAGNOSIS — L97512 Non-pressure chronic ulcer of other part of right foot with fat layer exposed: Secondary | ICD-10-CM | POA: Diagnosis not present

## 2016-12-06 DIAGNOSIS — L97514 Non-pressure chronic ulcer of other part of right foot with necrosis of bone: Secondary | ICD-10-CM | POA: Diagnosis not present

## 2016-12-07 DIAGNOSIS — D649 Anemia, unspecified: Secondary | ICD-10-CM | POA: Diagnosis not present

## 2016-12-07 DIAGNOSIS — I2510 Atherosclerotic heart disease of native coronary artery without angina pectoris: Secondary | ICD-10-CM | POA: Diagnosis not present

## 2016-12-07 DIAGNOSIS — E1140 Type 2 diabetes mellitus with diabetic neuropathy, unspecified: Secondary | ICD-10-CM | POA: Diagnosis not present

## 2016-12-07 DIAGNOSIS — L97511 Non-pressure chronic ulcer of other part of right foot limited to breakdown of skin: Secondary | ICD-10-CM | POA: Diagnosis not present

## 2016-12-07 DIAGNOSIS — I872 Venous insufficiency (chronic) (peripheral): Secondary | ICD-10-CM | POA: Diagnosis not present

## 2016-12-07 DIAGNOSIS — L97521 Non-pressure chronic ulcer of other part of left foot limited to breakdown of skin: Secondary | ICD-10-CM | POA: Diagnosis not present

## 2016-12-07 DIAGNOSIS — E1151 Type 2 diabetes mellitus with diabetic peripheral angiopathy without gangrene: Secondary | ICD-10-CM | POA: Diagnosis not present

## 2016-12-07 DIAGNOSIS — R569 Unspecified convulsions: Secondary | ICD-10-CM | POA: Diagnosis not present

## 2016-12-07 DIAGNOSIS — I10 Essential (primary) hypertension: Secondary | ICD-10-CM | POA: Diagnosis not present

## 2016-12-14 DIAGNOSIS — I872 Venous insufficiency (chronic) (peripheral): Secondary | ICD-10-CM | POA: Diagnosis not present

## 2016-12-14 DIAGNOSIS — I2510 Atherosclerotic heart disease of native coronary artery without angina pectoris: Secondary | ICD-10-CM | POA: Diagnosis not present

## 2016-12-14 DIAGNOSIS — R569 Unspecified convulsions: Secondary | ICD-10-CM | POA: Diagnosis not present

## 2016-12-14 DIAGNOSIS — I10 Essential (primary) hypertension: Secondary | ICD-10-CM | POA: Diagnosis not present

## 2016-12-14 DIAGNOSIS — E1140 Type 2 diabetes mellitus with diabetic neuropathy, unspecified: Secondary | ICD-10-CM | POA: Diagnosis not present

## 2016-12-14 DIAGNOSIS — D649 Anemia, unspecified: Secondary | ICD-10-CM | POA: Diagnosis not present

## 2016-12-14 DIAGNOSIS — E1151 Type 2 diabetes mellitus with diabetic peripheral angiopathy without gangrene: Secondary | ICD-10-CM | POA: Diagnosis not present

## 2016-12-14 DIAGNOSIS — L97521 Non-pressure chronic ulcer of other part of left foot limited to breakdown of skin: Secondary | ICD-10-CM | POA: Diagnosis not present

## 2016-12-14 DIAGNOSIS — L97511 Non-pressure chronic ulcer of other part of right foot limited to breakdown of skin: Secondary | ICD-10-CM | POA: Diagnosis not present

## 2016-12-22 DIAGNOSIS — I2510 Atherosclerotic heart disease of native coronary artery without angina pectoris: Secondary | ICD-10-CM | POA: Diagnosis not present

## 2016-12-22 DIAGNOSIS — R569 Unspecified convulsions: Secondary | ICD-10-CM | POA: Diagnosis not present

## 2016-12-22 DIAGNOSIS — I10 Essential (primary) hypertension: Secondary | ICD-10-CM | POA: Diagnosis not present

## 2016-12-22 DIAGNOSIS — L97521 Non-pressure chronic ulcer of other part of left foot limited to breakdown of skin: Secondary | ICD-10-CM | POA: Diagnosis not present

## 2016-12-22 DIAGNOSIS — D649 Anemia, unspecified: Secondary | ICD-10-CM | POA: Diagnosis not present

## 2016-12-22 DIAGNOSIS — L97511 Non-pressure chronic ulcer of other part of right foot limited to breakdown of skin: Secondary | ICD-10-CM | POA: Diagnosis not present

## 2016-12-22 DIAGNOSIS — E1140 Type 2 diabetes mellitus with diabetic neuropathy, unspecified: Secondary | ICD-10-CM | POA: Diagnosis not present

## 2016-12-22 DIAGNOSIS — I872 Venous insufficiency (chronic) (peripheral): Secondary | ICD-10-CM | POA: Diagnosis not present

## 2016-12-22 DIAGNOSIS — E1151 Type 2 diabetes mellitus with diabetic peripheral angiopathy without gangrene: Secondary | ICD-10-CM | POA: Diagnosis not present

## 2016-12-27 DIAGNOSIS — E1165 Type 2 diabetes mellitus with hyperglycemia: Secondary | ICD-10-CM | POA: Diagnosis not present

## 2016-12-27 DIAGNOSIS — I2510 Atherosclerotic heart disease of native coronary artery without angina pectoris: Secondary | ICD-10-CM | POA: Diagnosis not present

## 2016-12-27 DIAGNOSIS — I10 Essential (primary) hypertension: Secondary | ICD-10-CM | POA: Diagnosis not present

## 2016-12-27 DIAGNOSIS — Z23 Encounter for immunization: Secondary | ICD-10-CM | POA: Diagnosis not present

## 2016-12-28 DIAGNOSIS — D649 Anemia, unspecified: Secondary | ICD-10-CM | POA: Diagnosis not present

## 2016-12-28 DIAGNOSIS — L97521 Non-pressure chronic ulcer of other part of left foot limited to breakdown of skin: Secondary | ICD-10-CM | POA: Diagnosis not present

## 2016-12-28 DIAGNOSIS — I2510 Atherosclerotic heart disease of native coronary artery without angina pectoris: Secondary | ICD-10-CM | POA: Diagnosis not present

## 2016-12-28 DIAGNOSIS — E1140 Type 2 diabetes mellitus with diabetic neuropathy, unspecified: Secondary | ICD-10-CM | POA: Diagnosis not present

## 2016-12-28 DIAGNOSIS — R569 Unspecified convulsions: Secondary | ICD-10-CM | POA: Diagnosis not present

## 2016-12-28 DIAGNOSIS — E1151 Type 2 diabetes mellitus with diabetic peripheral angiopathy without gangrene: Secondary | ICD-10-CM | POA: Diagnosis not present

## 2016-12-28 DIAGNOSIS — L97511 Non-pressure chronic ulcer of other part of right foot limited to breakdown of skin: Secondary | ICD-10-CM | POA: Diagnosis not present

## 2016-12-28 DIAGNOSIS — I10 Essential (primary) hypertension: Secondary | ICD-10-CM | POA: Diagnosis not present

## 2016-12-28 DIAGNOSIS — I872 Venous insufficiency (chronic) (peripheral): Secondary | ICD-10-CM | POA: Diagnosis not present

## 2017-01-04 DIAGNOSIS — I872 Venous insufficiency (chronic) (peripheral): Secondary | ICD-10-CM | POA: Diagnosis not present

## 2017-01-04 DIAGNOSIS — R569 Unspecified convulsions: Secondary | ICD-10-CM | POA: Diagnosis not present

## 2017-01-04 DIAGNOSIS — E1140 Type 2 diabetes mellitus with diabetic neuropathy, unspecified: Secondary | ICD-10-CM | POA: Diagnosis not present

## 2017-01-04 DIAGNOSIS — I2510 Atherosclerotic heart disease of native coronary artery without angina pectoris: Secondary | ICD-10-CM | POA: Diagnosis not present

## 2017-01-04 DIAGNOSIS — L97511 Non-pressure chronic ulcer of other part of right foot limited to breakdown of skin: Secondary | ICD-10-CM | POA: Diagnosis not present

## 2017-01-04 DIAGNOSIS — L97521 Non-pressure chronic ulcer of other part of left foot limited to breakdown of skin: Secondary | ICD-10-CM | POA: Diagnosis not present

## 2017-01-04 DIAGNOSIS — I10 Essential (primary) hypertension: Secondary | ICD-10-CM | POA: Diagnosis not present

## 2017-01-04 DIAGNOSIS — D649 Anemia, unspecified: Secondary | ICD-10-CM | POA: Diagnosis not present

## 2017-01-04 DIAGNOSIS — E1151 Type 2 diabetes mellitus with diabetic peripheral angiopathy without gangrene: Secondary | ICD-10-CM | POA: Diagnosis not present

## 2017-02-06 DIAGNOSIS — Z89422 Acquired absence of other left toe(s): Secondary | ICD-10-CM | POA: Diagnosis not present

## 2017-02-06 DIAGNOSIS — E1142 Type 2 diabetes mellitus with diabetic polyneuropathy: Secondary | ICD-10-CM | POA: Diagnosis not present

## 2017-02-06 DIAGNOSIS — B351 Tinea unguium: Secondary | ICD-10-CM | POA: Diagnosis not present

## 2017-02-06 DIAGNOSIS — L851 Acquired keratosis [keratoderma] palmaris et plantaris: Secondary | ICD-10-CM | POA: Diagnosis not present

## 2017-02-14 DIAGNOSIS — H40153 Residual stage of open-angle glaucoma, bilateral: Secondary | ICD-10-CM | POA: Diagnosis not present

## 2017-04-11 ENCOUNTER — Encounter (INDEPENDENT_AMBULATORY_CARE_PROVIDER_SITE_OTHER): Payer: Medicare HMO

## 2017-04-11 ENCOUNTER — Ambulatory Visit (INDEPENDENT_AMBULATORY_CARE_PROVIDER_SITE_OTHER): Payer: Medicare HMO | Admitting: Vascular Surgery

## 2017-05-09 DIAGNOSIS — B351 Tinea unguium: Secondary | ICD-10-CM | POA: Diagnosis not present

## 2017-05-09 DIAGNOSIS — L851 Acquired keratosis [keratoderma] palmaris et plantaris: Secondary | ICD-10-CM | POA: Diagnosis not present

## 2017-05-09 DIAGNOSIS — E1142 Type 2 diabetes mellitus with diabetic polyneuropathy: Secondary | ICD-10-CM | POA: Diagnosis not present

## 2017-07-05 DIAGNOSIS — R569 Unspecified convulsions: Secondary | ICD-10-CM | POA: Diagnosis not present

## 2017-07-05 DIAGNOSIS — I10 Essential (primary) hypertension: Secondary | ICD-10-CM | POA: Diagnosis not present

## 2017-07-05 DIAGNOSIS — R531 Weakness: Secondary | ICD-10-CM | POA: Diagnosis not present

## 2017-07-05 DIAGNOSIS — Z0000 Encounter for general adult medical examination without abnormal findings: Secondary | ICD-10-CM | POA: Diagnosis not present

## 2017-07-05 DIAGNOSIS — E11649 Type 2 diabetes mellitus with hypoglycemia without coma: Secondary | ICD-10-CM | POA: Diagnosis not present

## 2017-07-05 DIAGNOSIS — I2510 Atherosclerotic heart disease of native coronary artery without angina pectoris: Secondary | ICD-10-CM | POA: Diagnosis not present

## 2017-09-12 DIAGNOSIS — B351 Tinea unguium: Secondary | ICD-10-CM | POA: Diagnosis not present

## 2017-09-12 DIAGNOSIS — E1159 Type 2 diabetes mellitus with other circulatory complications: Secondary | ICD-10-CM | POA: Diagnosis not present

## 2017-09-12 DIAGNOSIS — E1142 Type 2 diabetes mellitus with diabetic polyneuropathy: Secondary | ICD-10-CM | POA: Diagnosis not present

## 2017-10-15 DIAGNOSIS — S31819A Unspecified open wound of right buttock, initial encounter: Secondary | ICD-10-CM | POA: Diagnosis not present

## 2017-10-19 DIAGNOSIS — R29898 Other symptoms and signs involving the musculoskeletal system: Secondary | ICD-10-CM | POA: Diagnosis not present

## 2017-10-19 DIAGNOSIS — Z4800 Encounter for change or removal of nonsurgical wound dressing: Secondary | ICD-10-CM | POA: Diagnosis not present

## 2017-10-19 DIAGNOSIS — L89313 Pressure ulcer of right buttock, stage 3: Secondary | ICD-10-CM | POA: Diagnosis not present

## 2017-10-19 DIAGNOSIS — Z7902 Long term (current) use of antithrombotics/antiplatelets: Secondary | ICD-10-CM | POA: Diagnosis not present

## 2017-10-19 DIAGNOSIS — E119 Type 2 diabetes mellitus without complications: Secondary | ICD-10-CM | POA: Diagnosis not present

## 2017-10-19 DIAGNOSIS — Z7984 Long term (current) use of oral hypoglycemic drugs: Secondary | ICD-10-CM | POA: Diagnosis not present

## 2017-10-19 DIAGNOSIS — Z9181 History of falling: Secondary | ICD-10-CM | POA: Diagnosis not present

## 2017-10-20 DIAGNOSIS — R29898 Other symptoms and signs involving the musculoskeletal system: Secondary | ICD-10-CM | POA: Diagnosis not present

## 2017-10-20 DIAGNOSIS — S31819A Unspecified open wound of right buttock, initial encounter: Secondary | ICD-10-CM | POA: Diagnosis not present

## 2017-10-23 ENCOUNTER — Other Ambulatory Visit (INDEPENDENT_AMBULATORY_CARE_PROVIDER_SITE_OTHER): Payer: Self-pay | Admitting: Vascular Surgery

## 2017-10-24 DIAGNOSIS — Z4800 Encounter for change or removal of nonsurgical wound dressing: Secondary | ICD-10-CM | POA: Diagnosis not present

## 2017-10-24 DIAGNOSIS — Z9181 History of falling: Secondary | ICD-10-CM | POA: Diagnosis not present

## 2017-10-24 DIAGNOSIS — Z7984 Long term (current) use of oral hypoglycemic drugs: Secondary | ICD-10-CM | POA: Diagnosis not present

## 2017-10-24 DIAGNOSIS — E119 Type 2 diabetes mellitus without complications: Secondary | ICD-10-CM | POA: Diagnosis not present

## 2017-10-24 DIAGNOSIS — Z7902 Long term (current) use of antithrombotics/antiplatelets: Secondary | ICD-10-CM | POA: Diagnosis not present

## 2017-10-24 DIAGNOSIS — L89313 Pressure ulcer of right buttock, stage 3: Secondary | ICD-10-CM | POA: Diagnosis not present

## 2017-10-24 DIAGNOSIS — R29898 Other symptoms and signs involving the musculoskeletal system: Secondary | ICD-10-CM | POA: Diagnosis not present

## 2017-10-27 DIAGNOSIS — R29898 Other symptoms and signs involving the musculoskeletal system: Secondary | ICD-10-CM | POA: Diagnosis not present

## 2017-10-27 DIAGNOSIS — L89313 Pressure ulcer of right buttock, stage 3: Secondary | ICD-10-CM | POA: Diagnosis not present

## 2017-10-27 DIAGNOSIS — Z9181 History of falling: Secondary | ICD-10-CM | POA: Diagnosis not present

## 2017-10-27 DIAGNOSIS — Z7902 Long term (current) use of antithrombotics/antiplatelets: Secondary | ICD-10-CM | POA: Diagnosis not present

## 2017-10-27 DIAGNOSIS — E119 Type 2 diabetes mellitus without complications: Secondary | ICD-10-CM | POA: Diagnosis not present

## 2017-10-27 DIAGNOSIS — Z4800 Encounter for change or removal of nonsurgical wound dressing: Secondary | ICD-10-CM | POA: Diagnosis not present

## 2017-10-27 DIAGNOSIS — Z7984 Long term (current) use of oral hypoglycemic drugs: Secondary | ICD-10-CM | POA: Diagnosis not present

## 2017-10-30 DIAGNOSIS — L89313 Pressure ulcer of right buttock, stage 3: Secondary | ICD-10-CM | POA: Diagnosis not present

## 2017-10-30 DIAGNOSIS — Z4800 Encounter for change or removal of nonsurgical wound dressing: Secondary | ICD-10-CM | POA: Diagnosis not present

## 2017-10-30 DIAGNOSIS — Z7902 Long term (current) use of antithrombotics/antiplatelets: Secondary | ICD-10-CM | POA: Diagnosis not present

## 2017-10-30 DIAGNOSIS — E119 Type 2 diabetes mellitus without complications: Secondary | ICD-10-CM | POA: Diagnosis not present

## 2017-10-30 DIAGNOSIS — Z7984 Long term (current) use of oral hypoglycemic drugs: Secondary | ICD-10-CM | POA: Diagnosis not present

## 2017-10-30 DIAGNOSIS — Z9181 History of falling: Secondary | ICD-10-CM | POA: Diagnosis not present

## 2017-10-30 DIAGNOSIS — R29898 Other symptoms and signs involving the musculoskeletal system: Secondary | ICD-10-CM | POA: Diagnosis not present

## 2017-10-31 DIAGNOSIS — L89313 Pressure ulcer of right buttock, stage 3: Secondary | ICD-10-CM | POA: Diagnosis not present

## 2017-10-31 DIAGNOSIS — Z7902 Long term (current) use of antithrombotics/antiplatelets: Secondary | ICD-10-CM | POA: Diagnosis not present

## 2017-10-31 DIAGNOSIS — Z7984 Long term (current) use of oral hypoglycemic drugs: Secondary | ICD-10-CM | POA: Diagnosis not present

## 2017-10-31 DIAGNOSIS — Z9181 History of falling: Secondary | ICD-10-CM | POA: Diagnosis not present

## 2017-10-31 DIAGNOSIS — Z4800 Encounter for change or removal of nonsurgical wound dressing: Secondary | ICD-10-CM | POA: Diagnosis not present

## 2017-10-31 DIAGNOSIS — R29898 Other symptoms and signs involving the musculoskeletal system: Secondary | ICD-10-CM | POA: Diagnosis not present

## 2017-10-31 DIAGNOSIS — E119 Type 2 diabetes mellitus without complications: Secondary | ICD-10-CM | POA: Diagnosis not present

## 2017-11-01 DIAGNOSIS — E119 Type 2 diabetes mellitus without complications: Secondary | ICD-10-CM | POA: Diagnosis not present

## 2017-11-01 DIAGNOSIS — R29898 Other symptoms and signs involving the musculoskeletal system: Secondary | ICD-10-CM | POA: Diagnosis not present

## 2017-11-01 DIAGNOSIS — Z7902 Long term (current) use of antithrombotics/antiplatelets: Secondary | ICD-10-CM | POA: Diagnosis not present

## 2017-11-01 DIAGNOSIS — Z4800 Encounter for change or removal of nonsurgical wound dressing: Secondary | ICD-10-CM | POA: Diagnosis not present

## 2017-11-01 DIAGNOSIS — Z9181 History of falling: Secondary | ICD-10-CM | POA: Diagnosis not present

## 2017-11-01 DIAGNOSIS — L89313 Pressure ulcer of right buttock, stage 3: Secondary | ICD-10-CM | POA: Diagnosis not present

## 2017-11-01 DIAGNOSIS — Z7984 Long term (current) use of oral hypoglycemic drugs: Secondary | ICD-10-CM | POA: Diagnosis not present

## 2017-11-02 DIAGNOSIS — Z7902 Long term (current) use of antithrombotics/antiplatelets: Secondary | ICD-10-CM | POA: Diagnosis not present

## 2017-11-02 DIAGNOSIS — Z4800 Encounter for change or removal of nonsurgical wound dressing: Secondary | ICD-10-CM | POA: Diagnosis not present

## 2017-11-02 DIAGNOSIS — E119 Type 2 diabetes mellitus without complications: Secondary | ICD-10-CM | POA: Diagnosis not present

## 2017-11-02 DIAGNOSIS — L89313 Pressure ulcer of right buttock, stage 3: Secondary | ICD-10-CM | POA: Diagnosis not present

## 2017-11-02 DIAGNOSIS — R29898 Other symptoms and signs involving the musculoskeletal system: Secondary | ICD-10-CM | POA: Diagnosis not present

## 2017-11-02 DIAGNOSIS — Z9181 History of falling: Secondary | ICD-10-CM | POA: Diagnosis not present

## 2017-11-02 DIAGNOSIS — Z7984 Long term (current) use of oral hypoglycemic drugs: Secondary | ICD-10-CM | POA: Diagnosis not present

## 2017-11-03 DIAGNOSIS — Z4800 Encounter for change or removal of nonsurgical wound dressing: Secondary | ICD-10-CM | POA: Diagnosis not present

## 2017-11-03 DIAGNOSIS — Z7984 Long term (current) use of oral hypoglycemic drugs: Secondary | ICD-10-CM | POA: Diagnosis not present

## 2017-11-03 DIAGNOSIS — L89313 Pressure ulcer of right buttock, stage 3: Secondary | ICD-10-CM | POA: Diagnosis not present

## 2017-11-03 DIAGNOSIS — E119 Type 2 diabetes mellitus without complications: Secondary | ICD-10-CM | POA: Diagnosis not present

## 2017-11-03 DIAGNOSIS — Z9181 History of falling: Secondary | ICD-10-CM | POA: Diagnosis not present

## 2017-11-03 DIAGNOSIS — Z7902 Long term (current) use of antithrombotics/antiplatelets: Secondary | ICD-10-CM | POA: Diagnosis not present

## 2017-11-03 DIAGNOSIS — R29898 Other symptoms and signs involving the musculoskeletal system: Secondary | ICD-10-CM | POA: Diagnosis not present

## 2017-11-06 DIAGNOSIS — L89313 Pressure ulcer of right buttock, stage 3: Secondary | ICD-10-CM | POA: Diagnosis not present

## 2017-11-06 DIAGNOSIS — E119 Type 2 diabetes mellitus without complications: Secondary | ICD-10-CM | POA: Diagnosis not present

## 2017-11-06 DIAGNOSIS — Z7902 Long term (current) use of antithrombotics/antiplatelets: Secondary | ICD-10-CM | POA: Diagnosis not present

## 2017-11-06 DIAGNOSIS — R29898 Other symptoms and signs involving the musculoskeletal system: Secondary | ICD-10-CM | POA: Diagnosis not present

## 2017-11-06 DIAGNOSIS — Z4800 Encounter for change or removal of nonsurgical wound dressing: Secondary | ICD-10-CM | POA: Diagnosis not present

## 2017-11-06 DIAGNOSIS — Z7984 Long term (current) use of oral hypoglycemic drugs: Secondary | ICD-10-CM | POA: Diagnosis not present

## 2017-11-06 DIAGNOSIS — Z9181 History of falling: Secondary | ICD-10-CM | POA: Diagnosis not present

## 2017-11-07 DIAGNOSIS — Z4800 Encounter for change or removal of nonsurgical wound dressing: Secondary | ICD-10-CM | POA: Diagnosis not present

## 2017-11-07 DIAGNOSIS — R29898 Other symptoms and signs involving the musculoskeletal system: Secondary | ICD-10-CM | POA: Diagnosis not present

## 2017-11-07 DIAGNOSIS — E119 Type 2 diabetes mellitus without complications: Secondary | ICD-10-CM | POA: Diagnosis not present

## 2017-11-07 DIAGNOSIS — L89313 Pressure ulcer of right buttock, stage 3: Secondary | ICD-10-CM | POA: Diagnosis not present

## 2017-11-07 DIAGNOSIS — Z9181 History of falling: Secondary | ICD-10-CM | POA: Diagnosis not present

## 2017-11-09 DIAGNOSIS — Z7902 Long term (current) use of antithrombotics/antiplatelets: Secondary | ICD-10-CM | POA: Diagnosis not present

## 2017-11-09 DIAGNOSIS — E119 Type 2 diabetes mellitus without complications: Secondary | ICD-10-CM | POA: Diagnosis not present

## 2017-11-09 DIAGNOSIS — Z4800 Encounter for change or removal of nonsurgical wound dressing: Secondary | ICD-10-CM | POA: Diagnosis not present

## 2017-11-09 DIAGNOSIS — L89313 Pressure ulcer of right buttock, stage 3: Secondary | ICD-10-CM | POA: Diagnosis not present

## 2017-11-09 DIAGNOSIS — Z9181 History of falling: Secondary | ICD-10-CM | POA: Diagnosis not present

## 2017-11-09 DIAGNOSIS — R29898 Other symptoms and signs involving the musculoskeletal system: Secondary | ICD-10-CM | POA: Diagnosis not present

## 2017-11-09 DIAGNOSIS — Z7984 Long term (current) use of oral hypoglycemic drugs: Secondary | ICD-10-CM | POA: Diagnosis not present

## 2017-11-10 DIAGNOSIS — Z9181 History of falling: Secondary | ICD-10-CM | POA: Diagnosis not present

## 2017-11-10 DIAGNOSIS — Z7984 Long term (current) use of oral hypoglycemic drugs: Secondary | ICD-10-CM | POA: Diagnosis not present

## 2017-11-10 DIAGNOSIS — Z7902 Long term (current) use of antithrombotics/antiplatelets: Secondary | ICD-10-CM | POA: Diagnosis not present

## 2017-11-10 DIAGNOSIS — E119 Type 2 diabetes mellitus without complications: Secondary | ICD-10-CM | POA: Diagnosis not present

## 2017-11-10 DIAGNOSIS — L89313 Pressure ulcer of right buttock, stage 3: Secondary | ICD-10-CM | POA: Diagnosis not present

## 2017-11-10 DIAGNOSIS — R29898 Other symptoms and signs involving the musculoskeletal system: Secondary | ICD-10-CM | POA: Diagnosis not present

## 2017-11-10 DIAGNOSIS — Z4800 Encounter for change or removal of nonsurgical wound dressing: Secondary | ICD-10-CM | POA: Diagnosis not present

## 2017-11-13 DIAGNOSIS — R29898 Other symptoms and signs involving the musculoskeletal system: Secondary | ICD-10-CM | POA: Diagnosis not present

## 2017-11-13 DIAGNOSIS — Z9181 History of falling: Secondary | ICD-10-CM | POA: Diagnosis not present

## 2017-11-13 DIAGNOSIS — Z7984 Long term (current) use of oral hypoglycemic drugs: Secondary | ICD-10-CM | POA: Diagnosis not present

## 2017-11-13 DIAGNOSIS — E119 Type 2 diabetes mellitus without complications: Secondary | ICD-10-CM | POA: Diagnosis not present

## 2017-11-13 DIAGNOSIS — L89313 Pressure ulcer of right buttock, stage 3: Secondary | ICD-10-CM | POA: Diagnosis not present

## 2017-11-13 DIAGNOSIS — Z7902 Long term (current) use of antithrombotics/antiplatelets: Secondary | ICD-10-CM | POA: Diagnosis not present

## 2017-11-13 DIAGNOSIS — Z4800 Encounter for change or removal of nonsurgical wound dressing: Secondary | ICD-10-CM | POA: Diagnosis not present

## 2017-11-14 DIAGNOSIS — L89313 Pressure ulcer of right buttock, stage 3: Secondary | ICD-10-CM | POA: Diagnosis not present

## 2017-11-14 DIAGNOSIS — Z4800 Encounter for change or removal of nonsurgical wound dressing: Secondary | ICD-10-CM | POA: Diagnosis not present

## 2017-11-14 DIAGNOSIS — Z7902 Long term (current) use of antithrombotics/antiplatelets: Secondary | ICD-10-CM | POA: Diagnosis not present

## 2017-11-14 DIAGNOSIS — Z7984 Long term (current) use of oral hypoglycemic drugs: Secondary | ICD-10-CM | POA: Diagnosis not present

## 2017-11-14 DIAGNOSIS — E119 Type 2 diabetes mellitus without complications: Secondary | ICD-10-CM | POA: Diagnosis not present

## 2017-11-14 DIAGNOSIS — R29898 Other symptoms and signs involving the musculoskeletal system: Secondary | ICD-10-CM | POA: Diagnosis not present

## 2017-11-14 DIAGNOSIS — Z9181 History of falling: Secondary | ICD-10-CM | POA: Diagnosis not present

## 2017-11-16 DIAGNOSIS — R29898 Other symptoms and signs involving the musculoskeletal system: Secondary | ICD-10-CM | POA: Diagnosis not present

## 2017-11-16 DIAGNOSIS — Z4800 Encounter for change or removal of nonsurgical wound dressing: Secondary | ICD-10-CM | POA: Diagnosis not present

## 2017-11-16 DIAGNOSIS — Z9181 History of falling: Secondary | ICD-10-CM | POA: Diagnosis not present

## 2017-11-16 DIAGNOSIS — Z7902 Long term (current) use of antithrombotics/antiplatelets: Secondary | ICD-10-CM | POA: Diagnosis not present

## 2017-11-16 DIAGNOSIS — L89313 Pressure ulcer of right buttock, stage 3: Secondary | ICD-10-CM | POA: Diagnosis not present

## 2017-11-16 DIAGNOSIS — E119 Type 2 diabetes mellitus without complications: Secondary | ICD-10-CM | POA: Diagnosis not present

## 2017-11-16 DIAGNOSIS — Z7984 Long term (current) use of oral hypoglycemic drugs: Secondary | ICD-10-CM | POA: Diagnosis not present

## 2017-11-21 DIAGNOSIS — H903 Sensorineural hearing loss, bilateral: Secondary | ICD-10-CM | POA: Diagnosis not present

## 2017-11-21 DIAGNOSIS — H6123 Impacted cerumen, bilateral: Secondary | ICD-10-CM | POA: Diagnosis not present

## 2017-11-27 DIAGNOSIS — Z7984 Long term (current) use of oral hypoglycemic drugs: Secondary | ICD-10-CM | POA: Diagnosis not present

## 2017-11-27 DIAGNOSIS — L89313 Pressure ulcer of right buttock, stage 3: Secondary | ICD-10-CM | POA: Diagnosis not present

## 2017-11-27 DIAGNOSIS — E119 Type 2 diabetes mellitus without complications: Secondary | ICD-10-CM | POA: Diagnosis not present

## 2017-11-27 DIAGNOSIS — R29898 Other symptoms and signs involving the musculoskeletal system: Secondary | ICD-10-CM | POA: Diagnosis not present

## 2017-11-27 DIAGNOSIS — Z7902 Long term (current) use of antithrombotics/antiplatelets: Secondary | ICD-10-CM | POA: Diagnosis not present

## 2017-11-27 DIAGNOSIS — Z9181 History of falling: Secondary | ICD-10-CM | POA: Diagnosis not present

## 2017-11-27 DIAGNOSIS — Z4800 Encounter for change or removal of nonsurgical wound dressing: Secondary | ICD-10-CM | POA: Diagnosis not present

## 2017-11-29 DIAGNOSIS — L89313 Pressure ulcer of right buttock, stage 3: Secondary | ICD-10-CM | POA: Diagnosis not present

## 2017-11-29 DIAGNOSIS — Z4800 Encounter for change or removal of nonsurgical wound dressing: Secondary | ICD-10-CM | POA: Diagnosis not present

## 2017-11-29 DIAGNOSIS — E119 Type 2 diabetes mellitus without complications: Secondary | ICD-10-CM | POA: Diagnosis not present

## 2017-11-29 DIAGNOSIS — Z7902 Long term (current) use of antithrombotics/antiplatelets: Secondary | ICD-10-CM | POA: Diagnosis not present

## 2017-11-29 DIAGNOSIS — R29898 Other symptoms and signs involving the musculoskeletal system: Secondary | ICD-10-CM | POA: Diagnosis not present

## 2017-11-29 DIAGNOSIS — Z7984 Long term (current) use of oral hypoglycemic drugs: Secondary | ICD-10-CM | POA: Diagnosis not present

## 2017-11-29 DIAGNOSIS — Z9181 History of falling: Secondary | ICD-10-CM | POA: Diagnosis not present

## 2017-12-06 DIAGNOSIS — Z4800 Encounter for change or removal of nonsurgical wound dressing: Secondary | ICD-10-CM | POA: Diagnosis not present

## 2017-12-06 DIAGNOSIS — Z7984 Long term (current) use of oral hypoglycemic drugs: Secondary | ICD-10-CM | POA: Diagnosis not present

## 2017-12-06 DIAGNOSIS — E119 Type 2 diabetes mellitus without complications: Secondary | ICD-10-CM | POA: Diagnosis not present

## 2017-12-06 DIAGNOSIS — Z7902 Long term (current) use of antithrombotics/antiplatelets: Secondary | ICD-10-CM | POA: Diagnosis not present

## 2017-12-06 DIAGNOSIS — R29898 Other symptoms and signs involving the musculoskeletal system: Secondary | ICD-10-CM | POA: Diagnosis not present

## 2017-12-06 DIAGNOSIS — L89313 Pressure ulcer of right buttock, stage 3: Secondary | ICD-10-CM | POA: Diagnosis not present

## 2017-12-06 DIAGNOSIS — Z9181 History of falling: Secondary | ICD-10-CM | POA: Diagnosis not present

## 2017-12-08 DIAGNOSIS — Z4800 Encounter for change or removal of nonsurgical wound dressing: Secondary | ICD-10-CM | POA: Diagnosis not present

## 2017-12-08 DIAGNOSIS — Z9181 History of falling: Secondary | ICD-10-CM | POA: Diagnosis not present

## 2017-12-08 DIAGNOSIS — E119 Type 2 diabetes mellitus without complications: Secondary | ICD-10-CM | POA: Diagnosis not present

## 2017-12-08 DIAGNOSIS — Z7902 Long term (current) use of antithrombotics/antiplatelets: Secondary | ICD-10-CM | POA: Diagnosis not present

## 2017-12-08 DIAGNOSIS — Z7984 Long term (current) use of oral hypoglycemic drugs: Secondary | ICD-10-CM | POA: Diagnosis not present

## 2017-12-08 DIAGNOSIS — L89313 Pressure ulcer of right buttock, stage 3: Secondary | ICD-10-CM | POA: Diagnosis not present

## 2017-12-08 DIAGNOSIS — R29898 Other symptoms and signs involving the musculoskeletal system: Secondary | ICD-10-CM | POA: Diagnosis not present

## 2017-12-13 DIAGNOSIS — Z7902 Long term (current) use of antithrombotics/antiplatelets: Secondary | ICD-10-CM | POA: Diagnosis not present

## 2017-12-13 DIAGNOSIS — E119 Type 2 diabetes mellitus without complications: Secondary | ICD-10-CM | POA: Diagnosis not present

## 2017-12-13 DIAGNOSIS — Z9181 History of falling: Secondary | ICD-10-CM | POA: Diagnosis not present

## 2017-12-13 DIAGNOSIS — Z4800 Encounter for change or removal of nonsurgical wound dressing: Secondary | ICD-10-CM | POA: Diagnosis not present

## 2017-12-13 DIAGNOSIS — L89313 Pressure ulcer of right buttock, stage 3: Secondary | ICD-10-CM | POA: Diagnosis not present

## 2017-12-13 DIAGNOSIS — Z7984 Long term (current) use of oral hypoglycemic drugs: Secondary | ICD-10-CM | POA: Diagnosis not present

## 2017-12-13 DIAGNOSIS — R29898 Other symptoms and signs involving the musculoskeletal system: Secondary | ICD-10-CM | POA: Diagnosis not present

## 2018-01-09 DIAGNOSIS — E11649 Type 2 diabetes mellitus with hypoglycemia without coma: Secondary | ICD-10-CM | POA: Diagnosis not present

## 2018-01-09 DIAGNOSIS — I10 Essential (primary) hypertension: Secondary | ICD-10-CM | POA: Diagnosis not present

## 2018-01-09 DIAGNOSIS — S31819D Unspecified open wound of right buttock, subsequent encounter: Secondary | ICD-10-CM | POA: Diagnosis not present

## 2018-01-09 DIAGNOSIS — I2510 Atherosclerotic heart disease of native coronary artery without angina pectoris: Secondary | ICD-10-CM | POA: Diagnosis not present

## 2018-01-09 DIAGNOSIS — R569 Unspecified convulsions: Secondary | ICD-10-CM | POA: Diagnosis not present

## 2018-01-09 DIAGNOSIS — Z23 Encounter for immunization: Secondary | ICD-10-CM | POA: Diagnosis not present

## 2018-01-29 DIAGNOSIS — E1142 Type 2 diabetes mellitus with diabetic polyneuropathy: Secondary | ICD-10-CM | POA: Diagnosis not present

## 2018-01-29 DIAGNOSIS — E1140 Type 2 diabetes mellitus with diabetic neuropathy, unspecified: Secondary | ICD-10-CM | POA: Diagnosis not present

## 2018-01-29 DIAGNOSIS — B351 Tinea unguium: Secondary | ICD-10-CM | POA: Diagnosis not present

## 2018-01-29 DIAGNOSIS — L851 Acquired keratosis [keratoderma] palmaris et plantaris: Secondary | ICD-10-CM | POA: Diagnosis not present

## 2018-02-28 IMAGING — CT CT HEAD W/O CM
3 series · 15 of 46 positions shown, 18 images · non-contrast
Comparison: 11/04/2014

CLINICAL DATA: Altered mental status

EXAM:
CT HEAD WITHOUT CONTRAST
TECHNIQUE: Contiguous axial images were obtained from the base of the skull
through the vertex without intravenous contrast.

[Series 2: head wo · axial · 0.44mm/px · z∈[+195,+315]mm · 9 of 29 slices shown, 12 images]
[im 3/29  brain]
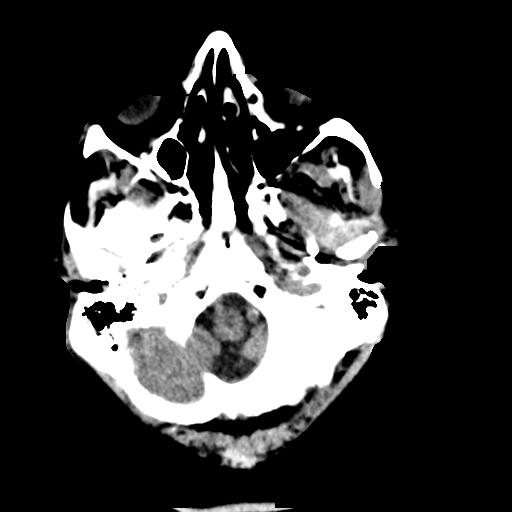
[im 3/29  bone]
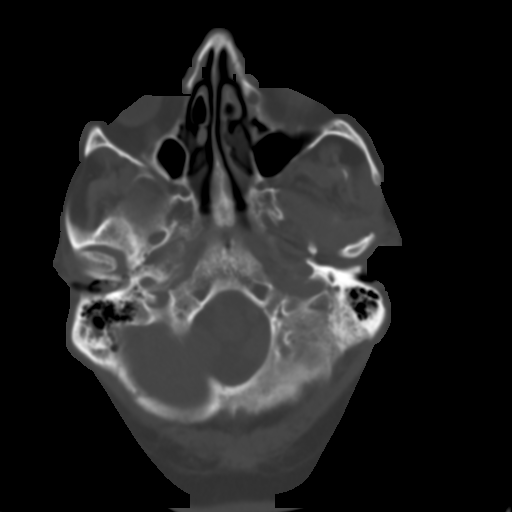
[im 6/29  brain]
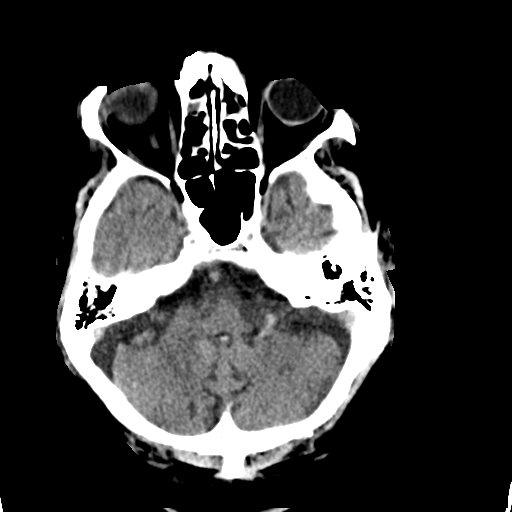
[im 9/29  brain]
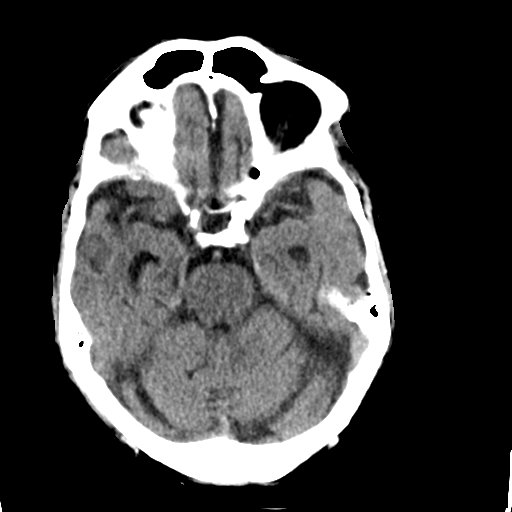
[im 12/29  brain]
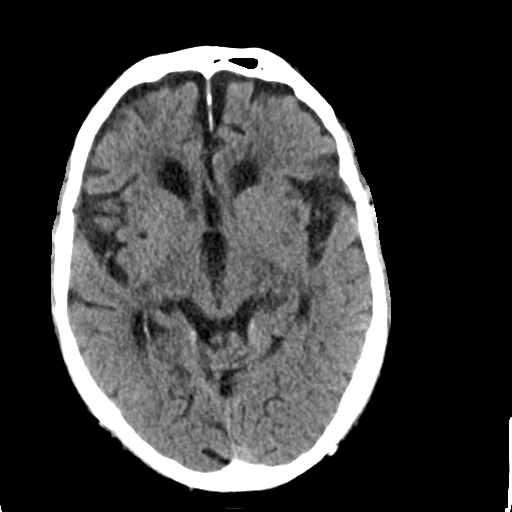
[im 15/29  brain]
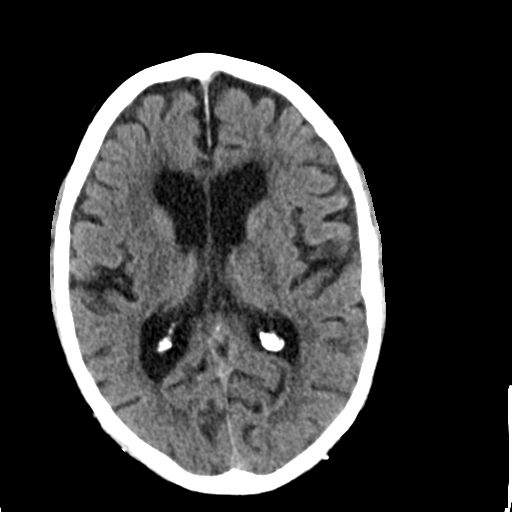
[im 15/29  bone]
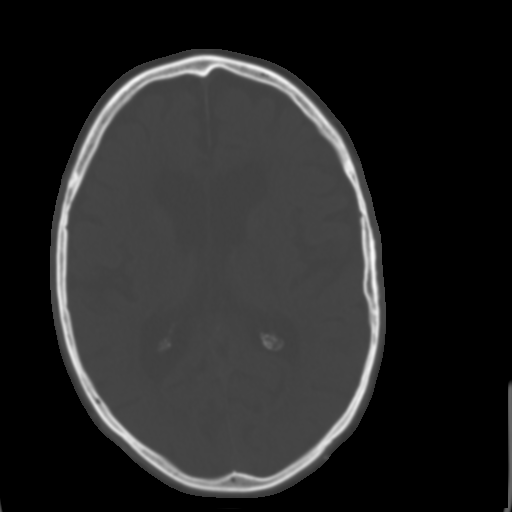
[im 18/29  brain]
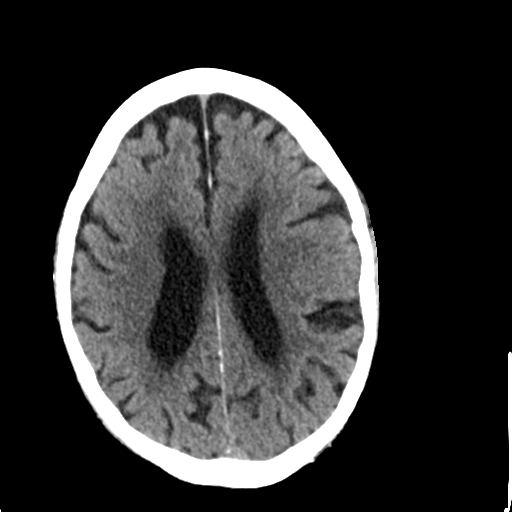
[im 21/29  brain]
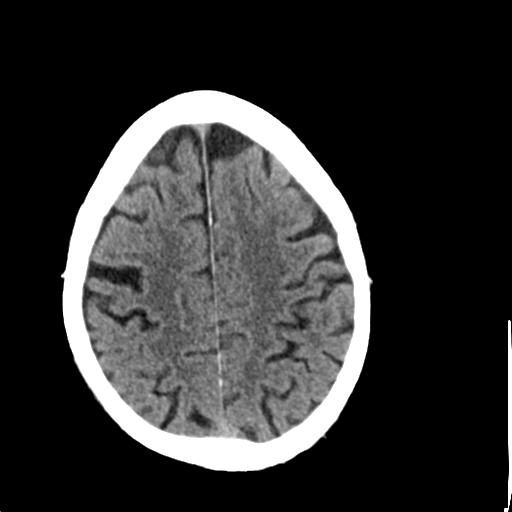
[im 24/29  brain]
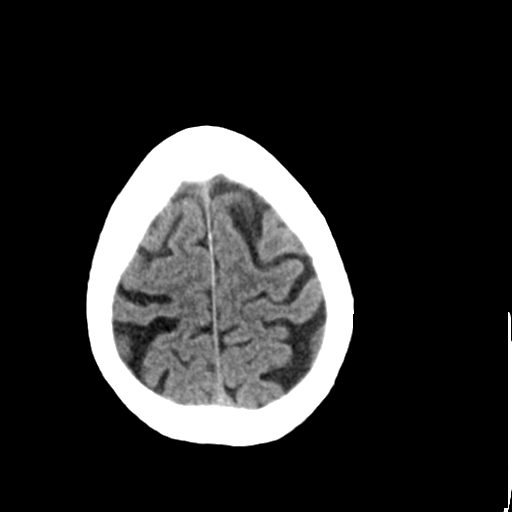
[im 27/29  brain]
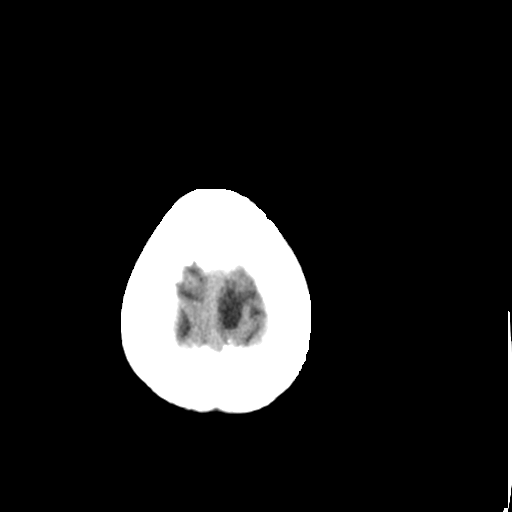
[im 27/29  bone]
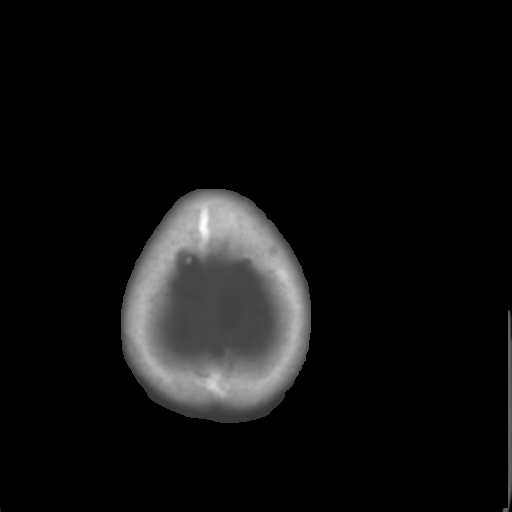

[Series 4: coronal soft tissue · coronal · 0.28mm/px · 3 of 68 slices shown]
[im 23/68  brain]
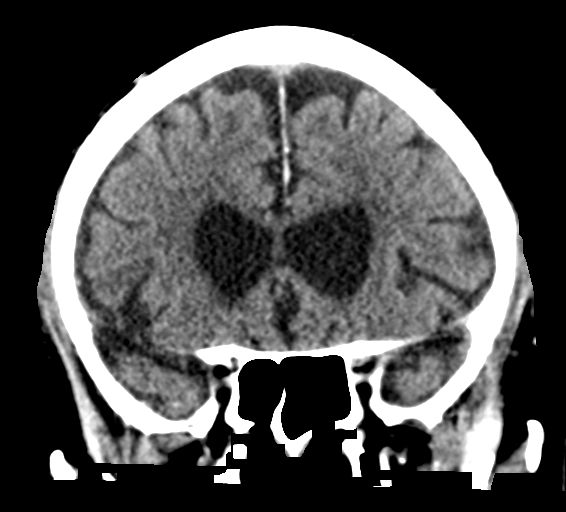
[im 30/68  brain]
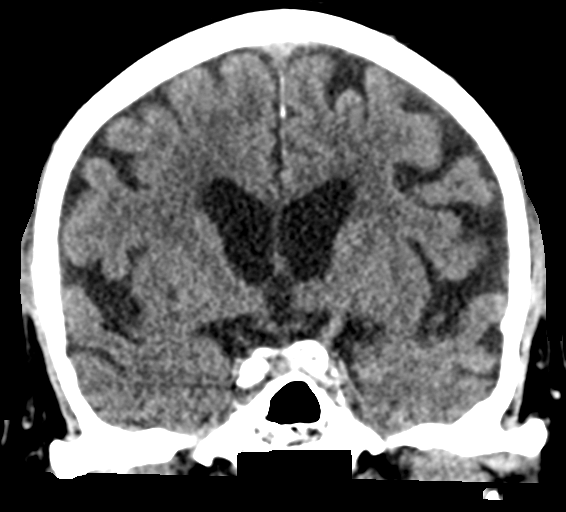
[im 38/68  brain]
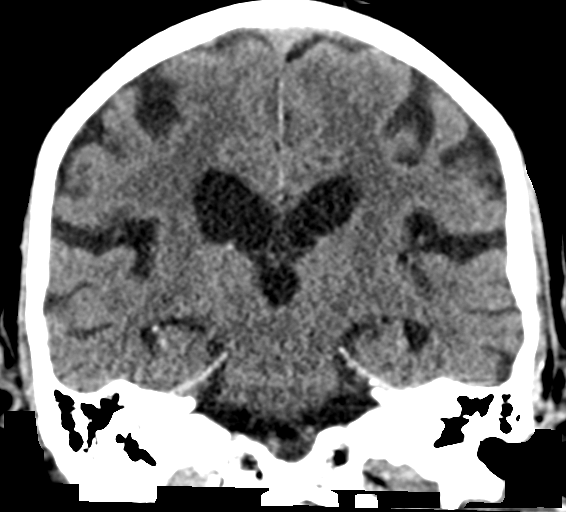

[Series 5: sagittal soft tissue · sagittal · 0.29mm/px · 3 of 53 slices shown]
[im 18/53  brain]
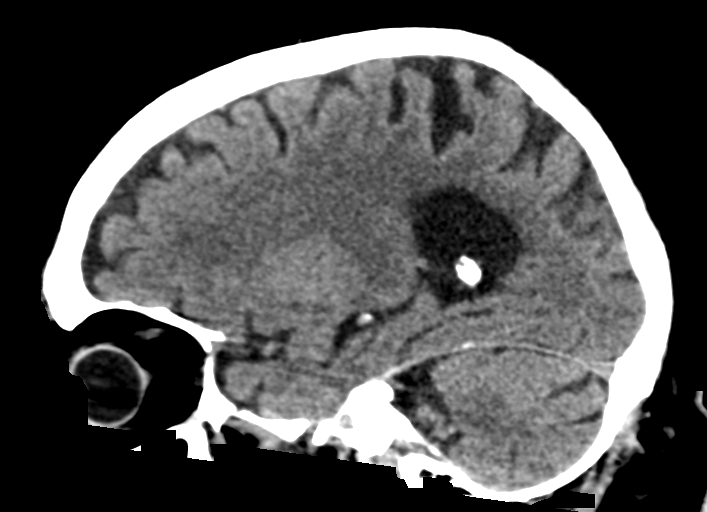
[im 27/53  brain]
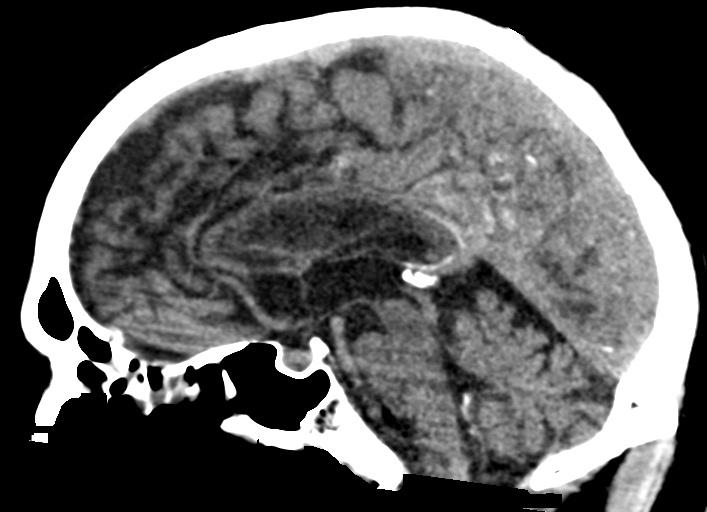
[im 35/53  brain]
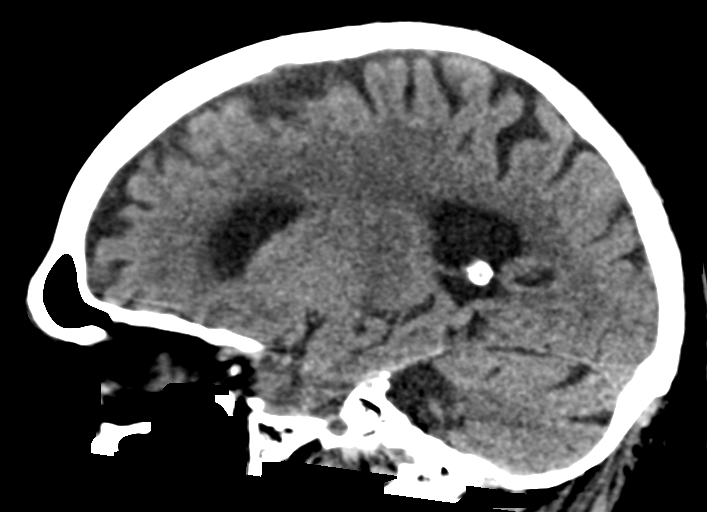

[15 of 46 positions shown; findings below may reference images not displayed]

FINDINGS: Brain: No intracranial hemorrhage, mass effect or midline shift.
Stable atrophy and chronic white matter disease. Ventricular size is
stable from prior exam. No acute cortical infarction. No mass lesion
is noted on this unenhanced scan.

Vascular: Atherosclerotic calcifications of carotid siphon.

Skull: No skull fracture is noted.

Sinuses/Orbits: There is mucosal thickening with partial
opacification ethmoid air cells. Mucosal thickening with partial
opacification right maxillary sinus. The mastoid air cells are
unremarkable.

Other: None
IMPRESSION: No acute intracranial abnormality. No definite acute cortical
infarction. Stable atrophy and chronic white matter disease.
Paranasal sinuses disease as described above.

## 2018-04-27 ENCOUNTER — Other Ambulatory Visit (INDEPENDENT_AMBULATORY_CARE_PROVIDER_SITE_OTHER): Payer: Self-pay | Admitting: Vascular Surgery

## 2018-04-27 DIAGNOSIS — Z8673 Personal history of transient ischemic attack (TIA), and cerebral infarction without residual deficits: Secondary | ICD-10-CM | POA: Diagnosis not present

## 2018-04-27 DIAGNOSIS — N182 Chronic kidney disease, stage 2 (mild): Secondary | ICD-10-CM | POA: Diagnosis not present

## 2018-04-27 DIAGNOSIS — E11649 Type 2 diabetes mellitus with hypoglycemia without coma: Secondary | ICD-10-CM | POA: Diagnosis not present

## 2018-04-27 DIAGNOSIS — I6310 Cerebral infarction due to embolism of unspecified precerebral artery: Secondary | ICD-10-CM | POA: Diagnosis not present

## 2018-04-27 DIAGNOSIS — Z0000 Encounter for general adult medical examination without abnormal findings: Secondary | ICD-10-CM | POA: Diagnosis not present

## 2018-04-27 DIAGNOSIS — I10 Essential (primary) hypertension: Secondary | ICD-10-CM | POA: Diagnosis not present

## 2018-04-27 DIAGNOSIS — R569 Unspecified convulsions: Secondary | ICD-10-CM | POA: Diagnosis not present

## 2018-05-01 DIAGNOSIS — R262 Difficulty in walking, not elsewhere classified: Secondary | ICD-10-CM | POA: Diagnosis not present

## 2018-05-01 DIAGNOSIS — N182 Chronic kidney disease, stage 2 (mild): Secondary | ICD-10-CM | POA: Diagnosis not present

## 2018-05-01 DIAGNOSIS — N400 Enlarged prostate without lower urinary tract symptoms: Secondary | ICD-10-CM | POA: Diagnosis not present

## 2018-05-01 DIAGNOSIS — I129 Hypertensive chronic kidney disease with stage 1 through stage 4 chronic kidney disease, or unspecified chronic kidney disease: Secondary | ICD-10-CM | POA: Diagnosis not present

## 2018-05-01 DIAGNOSIS — I2510 Atherosclerotic heart disease of native coronary artery without angina pectoris: Secondary | ICD-10-CM | POA: Diagnosis not present

## 2018-05-01 DIAGNOSIS — M6281 Muscle weakness (generalized): Secondary | ICD-10-CM | POA: Diagnosis not present

## 2018-05-01 DIAGNOSIS — E1122 Type 2 diabetes mellitus with diabetic chronic kidney disease: Secondary | ICD-10-CM | POA: Diagnosis not present

## 2018-05-01 DIAGNOSIS — I69398 Other sequelae of cerebral infarction: Secondary | ICD-10-CM | POA: Diagnosis not present

## 2018-05-01 DIAGNOSIS — D638 Anemia in other chronic diseases classified elsewhere: Secondary | ICD-10-CM | POA: Diagnosis not present

## 2018-05-04 DIAGNOSIS — R262 Difficulty in walking, not elsewhere classified: Secondary | ICD-10-CM | POA: Diagnosis not present

## 2018-05-04 DIAGNOSIS — D638 Anemia in other chronic diseases classified elsewhere: Secondary | ICD-10-CM | POA: Diagnosis not present

## 2018-05-04 DIAGNOSIS — I69398 Other sequelae of cerebral infarction: Secondary | ICD-10-CM | POA: Diagnosis not present

## 2018-05-04 DIAGNOSIS — E1122 Type 2 diabetes mellitus with diabetic chronic kidney disease: Secondary | ICD-10-CM | POA: Diagnosis not present

## 2018-05-04 DIAGNOSIS — E860 Dehydration: Secondary | ICD-10-CM | POA: Diagnosis not present

## 2018-05-04 DIAGNOSIS — I129 Hypertensive chronic kidney disease with stage 1 through stage 4 chronic kidney disease, or unspecified chronic kidney disease: Secondary | ICD-10-CM | POA: Diagnosis not present

## 2018-05-04 DIAGNOSIS — N182 Chronic kidney disease, stage 2 (mild): Secondary | ICD-10-CM | POA: Diagnosis not present

## 2018-05-04 DIAGNOSIS — I2510 Atherosclerotic heart disease of native coronary artery without angina pectoris: Secondary | ICD-10-CM | POA: Diagnosis not present

## 2018-05-04 DIAGNOSIS — N400 Enlarged prostate without lower urinary tract symptoms: Secondary | ICD-10-CM | POA: Diagnosis not present

## 2018-05-04 DIAGNOSIS — M6281 Muscle weakness (generalized): Secondary | ICD-10-CM | POA: Diagnosis not present

## 2018-05-07 DIAGNOSIS — I2510 Atherosclerotic heart disease of native coronary artery without angina pectoris: Secondary | ICD-10-CM | POA: Diagnosis not present

## 2018-05-07 DIAGNOSIS — R262 Difficulty in walking, not elsewhere classified: Secondary | ICD-10-CM | POA: Diagnosis not present

## 2018-05-07 DIAGNOSIS — E1122 Type 2 diabetes mellitus with diabetic chronic kidney disease: Secondary | ICD-10-CM | POA: Diagnosis not present

## 2018-05-07 DIAGNOSIS — N400 Enlarged prostate without lower urinary tract symptoms: Secondary | ICD-10-CM | POA: Diagnosis not present

## 2018-05-07 DIAGNOSIS — M6281 Muscle weakness (generalized): Secondary | ICD-10-CM | POA: Diagnosis not present

## 2018-05-07 DIAGNOSIS — D638 Anemia in other chronic diseases classified elsewhere: Secondary | ICD-10-CM | POA: Diagnosis not present

## 2018-05-07 DIAGNOSIS — N182 Chronic kidney disease, stage 2 (mild): Secondary | ICD-10-CM | POA: Diagnosis not present

## 2018-05-07 DIAGNOSIS — I69398 Other sequelae of cerebral infarction: Secondary | ICD-10-CM | POA: Diagnosis not present

## 2018-05-07 DIAGNOSIS — I129 Hypertensive chronic kidney disease with stage 1 through stage 4 chronic kidney disease, or unspecified chronic kidney disease: Secondary | ICD-10-CM | POA: Diagnosis not present

## 2018-05-09 DIAGNOSIS — I2510 Atherosclerotic heart disease of native coronary artery without angina pectoris: Secondary | ICD-10-CM | POA: Diagnosis not present

## 2018-05-09 DIAGNOSIS — I129 Hypertensive chronic kidney disease with stage 1 through stage 4 chronic kidney disease, or unspecified chronic kidney disease: Secondary | ICD-10-CM | POA: Diagnosis not present

## 2018-05-09 DIAGNOSIS — I69398 Other sequelae of cerebral infarction: Secondary | ICD-10-CM | POA: Diagnosis not present

## 2018-05-09 DIAGNOSIS — N400 Enlarged prostate without lower urinary tract symptoms: Secondary | ICD-10-CM | POA: Diagnosis not present

## 2018-05-09 DIAGNOSIS — N182 Chronic kidney disease, stage 2 (mild): Secondary | ICD-10-CM | POA: Diagnosis not present

## 2018-05-09 DIAGNOSIS — D638 Anemia in other chronic diseases classified elsewhere: Secondary | ICD-10-CM | POA: Diagnosis not present

## 2018-05-09 DIAGNOSIS — E1122 Type 2 diabetes mellitus with diabetic chronic kidney disease: Secondary | ICD-10-CM | POA: Diagnosis not present

## 2018-05-09 DIAGNOSIS — R262 Difficulty in walking, not elsewhere classified: Secondary | ICD-10-CM | POA: Diagnosis not present

## 2018-05-09 DIAGNOSIS — M6281 Muscle weakness (generalized): Secondary | ICD-10-CM | POA: Diagnosis not present

## 2018-05-14 DIAGNOSIS — R262 Difficulty in walking, not elsewhere classified: Secondary | ICD-10-CM | POA: Diagnosis not present

## 2018-05-14 DIAGNOSIS — I129 Hypertensive chronic kidney disease with stage 1 through stage 4 chronic kidney disease, or unspecified chronic kidney disease: Secondary | ICD-10-CM | POA: Diagnosis not present

## 2018-05-14 DIAGNOSIS — N182 Chronic kidney disease, stage 2 (mild): Secondary | ICD-10-CM | POA: Diagnosis not present

## 2018-05-14 DIAGNOSIS — N400 Enlarged prostate without lower urinary tract symptoms: Secondary | ICD-10-CM | POA: Diagnosis not present

## 2018-05-14 DIAGNOSIS — I69398 Other sequelae of cerebral infarction: Secondary | ICD-10-CM | POA: Diagnosis not present

## 2018-05-14 DIAGNOSIS — I2510 Atherosclerotic heart disease of native coronary artery without angina pectoris: Secondary | ICD-10-CM | POA: Diagnosis not present

## 2018-05-14 DIAGNOSIS — D638 Anemia in other chronic diseases classified elsewhere: Secondary | ICD-10-CM | POA: Diagnosis not present

## 2018-05-14 DIAGNOSIS — E1122 Type 2 diabetes mellitus with diabetic chronic kidney disease: Secondary | ICD-10-CM | POA: Diagnosis not present

## 2018-05-14 DIAGNOSIS — M6281 Muscle weakness (generalized): Secondary | ICD-10-CM | POA: Diagnosis not present

## 2018-05-16 DIAGNOSIS — N400 Enlarged prostate without lower urinary tract symptoms: Secondary | ICD-10-CM | POA: Diagnosis not present

## 2018-05-16 DIAGNOSIS — I129 Hypertensive chronic kidney disease with stage 1 through stage 4 chronic kidney disease, or unspecified chronic kidney disease: Secondary | ICD-10-CM | POA: Diagnosis not present

## 2018-05-16 DIAGNOSIS — R262 Difficulty in walking, not elsewhere classified: Secondary | ICD-10-CM | POA: Diagnosis not present

## 2018-05-16 DIAGNOSIS — N182 Chronic kidney disease, stage 2 (mild): Secondary | ICD-10-CM | POA: Diagnosis not present

## 2018-05-16 DIAGNOSIS — I2510 Atherosclerotic heart disease of native coronary artery without angina pectoris: Secondary | ICD-10-CM | POA: Diagnosis not present

## 2018-05-16 DIAGNOSIS — M6281 Muscle weakness (generalized): Secondary | ICD-10-CM | POA: Diagnosis not present

## 2018-05-16 DIAGNOSIS — D638 Anemia in other chronic diseases classified elsewhere: Secondary | ICD-10-CM | POA: Diagnosis not present

## 2018-05-16 DIAGNOSIS — E1122 Type 2 diabetes mellitus with diabetic chronic kidney disease: Secondary | ICD-10-CM | POA: Diagnosis not present

## 2018-05-16 DIAGNOSIS — I69398 Other sequelae of cerebral infarction: Secondary | ICD-10-CM | POA: Diagnosis not present

## 2018-05-18 DIAGNOSIS — E860 Dehydration: Secondary | ICD-10-CM | POA: Diagnosis not present

## 2018-05-23 DIAGNOSIS — M6281 Muscle weakness (generalized): Secondary | ICD-10-CM | POA: Diagnosis not present

## 2018-05-23 DIAGNOSIS — R262 Difficulty in walking, not elsewhere classified: Secondary | ICD-10-CM | POA: Diagnosis not present

## 2018-05-23 DIAGNOSIS — I2510 Atherosclerotic heart disease of native coronary artery without angina pectoris: Secondary | ICD-10-CM | POA: Diagnosis not present

## 2018-05-23 DIAGNOSIS — N400 Enlarged prostate without lower urinary tract symptoms: Secondary | ICD-10-CM | POA: Diagnosis not present

## 2018-05-23 DIAGNOSIS — E1122 Type 2 diabetes mellitus with diabetic chronic kidney disease: Secondary | ICD-10-CM | POA: Diagnosis not present

## 2018-05-23 DIAGNOSIS — I69398 Other sequelae of cerebral infarction: Secondary | ICD-10-CM | POA: Diagnosis not present

## 2018-05-23 DIAGNOSIS — D638 Anemia in other chronic diseases classified elsewhere: Secondary | ICD-10-CM | POA: Diagnosis not present

## 2018-05-23 DIAGNOSIS — I129 Hypertensive chronic kidney disease with stage 1 through stage 4 chronic kidney disease, or unspecified chronic kidney disease: Secondary | ICD-10-CM | POA: Diagnosis not present

## 2018-05-23 DIAGNOSIS — N182 Chronic kidney disease, stage 2 (mild): Secondary | ICD-10-CM | POA: Diagnosis not present

## 2018-05-25 DIAGNOSIS — I2510 Atherosclerotic heart disease of native coronary artery without angina pectoris: Secondary | ICD-10-CM | POA: Diagnosis not present

## 2018-05-25 DIAGNOSIS — N182 Chronic kidney disease, stage 2 (mild): Secondary | ICD-10-CM | POA: Diagnosis not present

## 2018-05-25 DIAGNOSIS — E1122 Type 2 diabetes mellitus with diabetic chronic kidney disease: Secondary | ICD-10-CM | POA: Diagnosis not present

## 2018-05-25 DIAGNOSIS — N400 Enlarged prostate without lower urinary tract symptoms: Secondary | ICD-10-CM | POA: Diagnosis not present

## 2018-05-25 DIAGNOSIS — D638 Anemia in other chronic diseases classified elsewhere: Secondary | ICD-10-CM | POA: Diagnosis not present

## 2018-05-25 DIAGNOSIS — R262 Difficulty in walking, not elsewhere classified: Secondary | ICD-10-CM | POA: Diagnosis not present

## 2018-05-25 DIAGNOSIS — M6281 Muscle weakness (generalized): Secondary | ICD-10-CM | POA: Diagnosis not present

## 2018-05-25 DIAGNOSIS — I129 Hypertensive chronic kidney disease with stage 1 through stage 4 chronic kidney disease, or unspecified chronic kidney disease: Secondary | ICD-10-CM | POA: Diagnosis not present

## 2018-05-25 DIAGNOSIS — I69398 Other sequelae of cerebral infarction: Secondary | ICD-10-CM | POA: Diagnosis not present

## 2018-05-28 DIAGNOSIS — E1122 Type 2 diabetes mellitus with diabetic chronic kidney disease: Secondary | ICD-10-CM | POA: Diagnosis not present

## 2018-05-28 DIAGNOSIS — N400 Enlarged prostate without lower urinary tract symptoms: Secondary | ICD-10-CM | POA: Diagnosis not present

## 2018-05-28 DIAGNOSIS — M6281 Muscle weakness (generalized): Secondary | ICD-10-CM | POA: Diagnosis not present

## 2018-05-28 DIAGNOSIS — I69398 Other sequelae of cerebral infarction: Secondary | ICD-10-CM | POA: Diagnosis not present

## 2018-05-28 DIAGNOSIS — I129 Hypertensive chronic kidney disease with stage 1 through stage 4 chronic kidney disease, or unspecified chronic kidney disease: Secondary | ICD-10-CM | POA: Diagnosis not present

## 2018-05-28 DIAGNOSIS — R262 Difficulty in walking, not elsewhere classified: Secondary | ICD-10-CM | POA: Diagnosis not present

## 2018-05-28 DIAGNOSIS — N182 Chronic kidney disease, stage 2 (mild): Secondary | ICD-10-CM | POA: Diagnosis not present

## 2018-05-28 DIAGNOSIS — I2510 Atherosclerotic heart disease of native coronary artery without angina pectoris: Secondary | ICD-10-CM | POA: Diagnosis not present

## 2018-05-28 DIAGNOSIS — D638 Anemia in other chronic diseases classified elsewhere: Secondary | ICD-10-CM | POA: Diagnosis not present

## 2018-05-30 DIAGNOSIS — D638 Anemia in other chronic diseases classified elsewhere: Secondary | ICD-10-CM | POA: Diagnosis not present

## 2018-05-30 DIAGNOSIS — M6281 Muscle weakness (generalized): Secondary | ICD-10-CM | POA: Diagnosis not present

## 2018-05-30 DIAGNOSIS — R262 Difficulty in walking, not elsewhere classified: Secondary | ICD-10-CM | POA: Diagnosis not present

## 2018-05-30 DIAGNOSIS — I129 Hypertensive chronic kidney disease with stage 1 through stage 4 chronic kidney disease, or unspecified chronic kidney disease: Secondary | ICD-10-CM | POA: Diagnosis not present

## 2018-05-30 DIAGNOSIS — I69398 Other sequelae of cerebral infarction: Secondary | ICD-10-CM | POA: Diagnosis not present

## 2018-05-30 DIAGNOSIS — E1122 Type 2 diabetes mellitus with diabetic chronic kidney disease: Secondary | ICD-10-CM | POA: Diagnosis not present

## 2018-05-30 DIAGNOSIS — N182 Chronic kidney disease, stage 2 (mild): Secondary | ICD-10-CM | POA: Diagnosis not present

## 2018-05-30 DIAGNOSIS — I2510 Atherosclerotic heart disease of native coronary artery without angina pectoris: Secondary | ICD-10-CM | POA: Diagnosis not present

## 2018-05-30 DIAGNOSIS — N400 Enlarged prostate without lower urinary tract symptoms: Secondary | ICD-10-CM | POA: Diagnosis not present

## 2018-06-06 DIAGNOSIS — I2510 Atherosclerotic heart disease of native coronary artery without angina pectoris: Secondary | ICD-10-CM | POA: Diagnosis not present

## 2018-06-06 DIAGNOSIS — M6281 Muscle weakness (generalized): Secondary | ICD-10-CM | POA: Diagnosis not present

## 2018-06-06 DIAGNOSIS — N182 Chronic kidney disease, stage 2 (mild): Secondary | ICD-10-CM | POA: Diagnosis not present

## 2018-06-06 DIAGNOSIS — I129 Hypertensive chronic kidney disease with stage 1 through stage 4 chronic kidney disease, or unspecified chronic kidney disease: Secondary | ICD-10-CM | POA: Diagnosis not present

## 2018-06-06 DIAGNOSIS — N400 Enlarged prostate without lower urinary tract symptoms: Secondary | ICD-10-CM | POA: Diagnosis not present

## 2018-06-06 DIAGNOSIS — R262 Difficulty in walking, not elsewhere classified: Secondary | ICD-10-CM | POA: Diagnosis not present

## 2018-06-06 DIAGNOSIS — I69398 Other sequelae of cerebral infarction: Secondary | ICD-10-CM | POA: Diagnosis not present

## 2018-06-06 DIAGNOSIS — E1122 Type 2 diabetes mellitus with diabetic chronic kidney disease: Secondary | ICD-10-CM | POA: Diagnosis not present

## 2018-06-06 DIAGNOSIS — D638 Anemia in other chronic diseases classified elsewhere: Secondary | ICD-10-CM | POA: Diagnosis not present

## 2018-06-08 DIAGNOSIS — D638 Anemia in other chronic diseases classified elsewhere: Secondary | ICD-10-CM | POA: Diagnosis not present

## 2018-06-08 DIAGNOSIS — I2510 Atherosclerotic heart disease of native coronary artery without angina pectoris: Secondary | ICD-10-CM | POA: Diagnosis not present

## 2018-06-08 DIAGNOSIS — E1122 Type 2 diabetes mellitus with diabetic chronic kidney disease: Secondary | ICD-10-CM | POA: Diagnosis not present

## 2018-06-08 DIAGNOSIS — M6281 Muscle weakness (generalized): Secondary | ICD-10-CM | POA: Diagnosis not present

## 2018-06-08 DIAGNOSIS — R262 Difficulty in walking, not elsewhere classified: Secondary | ICD-10-CM | POA: Diagnosis not present

## 2018-06-08 DIAGNOSIS — I129 Hypertensive chronic kidney disease with stage 1 through stage 4 chronic kidney disease, or unspecified chronic kidney disease: Secondary | ICD-10-CM | POA: Diagnosis not present

## 2018-06-08 DIAGNOSIS — N400 Enlarged prostate without lower urinary tract symptoms: Secondary | ICD-10-CM | POA: Diagnosis not present

## 2018-06-08 DIAGNOSIS — I69398 Other sequelae of cerebral infarction: Secondary | ICD-10-CM | POA: Diagnosis not present

## 2018-06-08 DIAGNOSIS — N182 Chronic kidney disease, stage 2 (mild): Secondary | ICD-10-CM | POA: Diagnosis not present

## 2018-07-03 ENCOUNTER — Emergency Department: Payer: Medicare HMO

## 2018-07-03 ENCOUNTER — Encounter: Payer: Self-pay | Admitting: Emergency Medicine

## 2018-07-03 ENCOUNTER — Inpatient Hospital Stay
Admission: EM | Admit: 2018-07-03 | Discharge: 2018-07-05 | DRG: 853 | Disposition: A | Discharge status: Home Health | Payer: Medicare HMO | Source: Skilled Nursing Facility | Attending: Internal Medicine | Admitting: Internal Medicine

## 2018-07-03 ENCOUNTER — Other Ambulatory Visit: Payer: Self-pay

## 2018-07-03 DIAGNOSIS — R531 Weakness: Secondary | ICD-10-CM | POA: Diagnosis not present

## 2018-07-03 DIAGNOSIS — N390 Urinary tract infection, site not specified: Secondary | ICD-10-CM | POA: Diagnosis not present

## 2018-07-03 DIAGNOSIS — N136 Pyonephrosis: Secondary | ICD-10-CM | POA: Diagnosis not present

## 2018-07-03 DIAGNOSIS — H409 Unspecified glaucoma: Secondary | ICD-10-CM | POA: Diagnosis present

## 2018-07-03 DIAGNOSIS — A419 Sepsis, unspecified organism: Secondary | ICD-10-CM | POA: Diagnosis not present

## 2018-07-03 DIAGNOSIS — E871 Hypo-osmolality and hyponatremia: Secondary | ICD-10-CM | POA: Diagnosis present

## 2018-07-03 DIAGNOSIS — Z974 Presence of external hearing-aid: Secondary | ICD-10-CM | POA: Diagnosis not present

## 2018-07-03 DIAGNOSIS — I252 Old myocardial infarction: Secondary | ICD-10-CM

## 2018-07-03 DIAGNOSIS — J189 Pneumonia, unspecified organism: Secondary | ICD-10-CM | POA: Diagnosis not present

## 2018-07-03 DIAGNOSIS — N400 Enlarged prostate without lower urinary tract symptoms: Secondary | ICD-10-CM | POA: Diagnosis present

## 2018-07-03 DIAGNOSIS — E1151 Type 2 diabetes mellitus with diabetic peripheral angiopathy without gangrene: Secondary | ICD-10-CM | POA: Diagnosis present

## 2018-07-03 DIAGNOSIS — N1330 Unspecified hydronephrosis: Secondary | ICD-10-CM | POA: Diagnosis not present

## 2018-07-03 DIAGNOSIS — N183 Chronic kidney disease, stage 3 (moderate): Secondary | ICD-10-CM | POA: Diagnosis present

## 2018-07-03 DIAGNOSIS — Z89422 Acquired absence of other left toe(s): Secondary | ICD-10-CM | POA: Diagnosis not present

## 2018-07-03 DIAGNOSIS — E785 Hyperlipidemia, unspecified: Secondary | ICD-10-CM | POA: Diagnosis not present

## 2018-07-03 DIAGNOSIS — I2510 Atherosclerotic heart disease of native coronary artery without angina pectoris: Secondary | ICD-10-CM | POA: Diagnosis present

## 2018-07-03 DIAGNOSIS — E1122 Type 2 diabetes mellitus with diabetic chronic kidney disease: Secondary | ICD-10-CM | POA: Diagnosis present

## 2018-07-03 DIAGNOSIS — Z87442 Personal history of urinary calculi: Secondary | ICD-10-CM | POA: Diagnosis not present

## 2018-07-03 DIAGNOSIS — H9193 Unspecified hearing loss, bilateral: Secondary | ICD-10-CM | POA: Diagnosis present

## 2018-07-03 DIAGNOSIS — G9341 Metabolic encephalopathy: Secondary | ICD-10-CM | POA: Diagnosis present

## 2018-07-03 DIAGNOSIS — I700 Atherosclerosis of aorta: Secondary | ICD-10-CM | POA: Diagnosis not present

## 2018-07-03 DIAGNOSIS — Z952 Presence of prosthetic heart valve: Secondary | ICD-10-CM

## 2018-07-03 DIAGNOSIS — Z955 Presence of coronary angioplasty implant and graft: Secondary | ICD-10-CM

## 2018-07-03 DIAGNOSIS — R4182 Altered mental status, unspecified: Secondary | ICD-10-CM | POA: Diagnosis not present

## 2018-07-03 DIAGNOSIS — N201 Calculus of ureter: Secondary | ICD-10-CM | POA: Diagnosis not present

## 2018-07-03 DIAGNOSIS — Z7984 Long term (current) use of oral hypoglycemic drugs: Secondary | ICD-10-CM

## 2018-07-03 DIAGNOSIS — Z79899 Other long term (current) drug therapy: Secondary | ICD-10-CM

## 2018-07-03 DIAGNOSIS — Z8673 Personal history of transient ischemic attack (TIA), and cerebral infarction without residual deficits: Secondary | ICD-10-CM

## 2018-07-03 DIAGNOSIS — Z8249 Family history of ischemic heart disease and other diseases of the circulatory system: Secondary | ICD-10-CM

## 2018-07-03 DIAGNOSIS — I10 Essential (primary) hypertension: Secondary | ICD-10-CM | POA: Diagnosis not present

## 2018-07-03 DIAGNOSIS — N179 Acute kidney failure, unspecified: Secondary | ICD-10-CM | POA: Diagnosis present

## 2018-07-03 DIAGNOSIS — G40909 Epilepsy, unspecified, not intractable, without status epilepticus: Secondary | ICD-10-CM | POA: Diagnosis present

## 2018-07-03 DIAGNOSIS — I129 Hypertensive chronic kidney disease with stage 1 through stage 4 chronic kidney disease, or unspecified chronic kidney disease: Secondary | ICD-10-CM | POA: Diagnosis present

## 2018-07-03 DIAGNOSIS — F17220 Nicotine dependence, chewing tobacco, uncomplicated: Secondary | ICD-10-CM | POA: Diagnosis present

## 2018-07-03 DIAGNOSIS — N132 Hydronephrosis with renal and ureteral calculous obstruction: Secondary | ICD-10-CM | POA: Diagnosis not present

## 2018-07-03 DIAGNOSIS — E1140 Type 2 diabetes mellitus with diabetic neuropathy, unspecified: Secondary | ICD-10-CM | POA: Diagnosis not present

## 2018-07-03 DIAGNOSIS — Z7902 Long term (current) use of antithrombotics/antiplatelets: Secondary | ICD-10-CM

## 2018-07-03 DIAGNOSIS — R404 Transient alteration of awareness: Secondary | ICD-10-CM | POA: Diagnosis not present

## 2018-07-03 DIAGNOSIS — N200 Calculus of kidney: Secondary | ICD-10-CM | POA: Diagnosis not present

## 2018-07-03 DIAGNOSIS — Z8614 Personal history of Methicillin resistant Staphylococcus aureus infection: Secondary | ICD-10-CM

## 2018-07-03 DIAGNOSIS — Z882 Allergy status to sulfonamides status: Secondary | ICD-10-CM

## 2018-07-03 DIAGNOSIS — R410 Disorientation, unspecified: Secondary | ICD-10-CM | POA: Diagnosis not present

## 2018-07-03 LAB — CBC WITH DIFFERENTIAL/PLATELET
Abs Immature Granulocytes: 0.05 10*3/uL (ref 0.00–0.07)
Basophils Absolute: 0 10*3/uL (ref 0.0–0.1)
Basophils Relative: 0 %
Eosinophils Absolute: 0 10*3/uL (ref 0.0–0.5)
Eosinophils Relative: 0 %
HCT: 34.6 % — ABNORMAL LOW (ref 39.0–52.0)
Hemoglobin: 12 g/dL — ABNORMAL LOW (ref 13.0–17.0)
Immature Granulocytes: 1 %
Lymphocytes Relative: 9 %
Lymphs Abs: 0.8 10*3/uL (ref 0.7–4.0)
MCH: 30.6 pg (ref 26.0–34.0)
MCHC: 34.7 g/dL (ref 30.0–36.0)
MCV: 88.3 fL (ref 80.0–100.0)
MONO ABS: 0.5 10*3/uL (ref 0.1–1.0)
Monocytes Relative: 6 %
NEUTROS ABS: 7 10*3/uL (ref 1.7–7.7)
Neutrophils Relative %: 84 %
Platelets: 206 10*3/uL (ref 150–400)
RBC: 3.92 MIL/uL — AB (ref 4.22–5.81)
RDW: 13 % (ref 11.5–15.5)
WBC: 8.4 10*3/uL (ref 4.0–10.5)
nRBC: 0 % (ref 0.0–0.2)

## 2018-07-03 LAB — URINALYSIS, COMPLETE (UACMP) WITH MICROSCOPIC
Bilirubin Urine: NEGATIVE
Glucose, UA: 150 mg/dL — AB
Ketones, ur: NEGATIVE mg/dL
Nitrite: NEGATIVE
Protein, ur: 100 mg/dL — AB
Specific Gravity, Urine: 1.015 (ref 1.005–1.030)
Squamous Epithelial / HPF: NONE SEEN (ref 0–5)
pH: 6 (ref 5.0–8.0)

## 2018-07-03 LAB — COMPREHENSIVE METABOLIC PANEL
ALT: 12 U/L (ref 0–44)
AST: 19 U/L (ref 15–41)
Albumin: 3.6 g/dL (ref 3.5–5.0)
Alkaline Phosphatase: 61 U/L (ref 38–126)
Anion gap: 14 (ref 5–15)
BUN: 46 mg/dL — ABNORMAL HIGH (ref 8–23)
CO2: 16 mmol/L — ABNORMAL LOW (ref 22–32)
Calcium: 9 mg/dL (ref 8.9–10.3)
Chloride: 101 mmol/L (ref 98–111)
Creatinine, Ser: 3.16 mg/dL — ABNORMAL HIGH (ref 0.61–1.24)
GFR calc Af Amer: 19 mL/min — ABNORMAL LOW (ref >60)
GFR calc non Af Amer: 17 mL/min — ABNORMAL LOW (ref >60)
Glucose, Bld: 220 mg/dL — ABNORMAL HIGH (ref 70–99)
Potassium: 4.5 mmol/L (ref 3.5–5.1)
Sodium: 131 mmol/L — ABNORMAL LOW (ref 135–145)
Total Bilirubin: 0.8 mg/dL (ref 0.3–1.2)
Total Protein: 6.6 g/dL (ref 6.5–8.1)

## 2018-07-03 LAB — PROTIME-INR
INR: 1.3 — ABNORMAL HIGH (ref 0.8–1.2)
Prothrombin Time: 15.9 seconds — ABNORMAL HIGH (ref 11.4–15.2)

## 2018-07-03 LAB — GLUCOSE, CAPILLARY
Glucose-Capillary: 190 mg/dL — ABNORMAL HIGH (ref 70–99)
Glucose-Capillary: 171 mg/dL — ABNORMAL HIGH (ref 70–99)

## 2018-07-03 LAB — LACTIC ACID, PLASMA: Lactic Acid, Venous: 1.8 mmol/L (ref 0.5–1.9)

## 2018-07-03 MED ORDER — LEVETIRACETAM 500 MG PO TABS
500.0000 mg | ORAL_TABLET | Freq: Two times a day (BID) | ORAL | Status: DC
  Administered 2018-07-03 – 2018-07-05 (×4): 500 mg via ORAL
  Filled 2018-07-03 (×6): qty 1

## 2018-07-03 MED ORDER — ONDANSETRON HCL 4 MG/2ML IJ SOLN: 4.0000 mg | Freq: Four times a day (QID) | INTRAMUSCULAR | Status: DC | PRN

## 2018-07-03 MED ORDER — LATANOPROST 0.005 % OP SOLN
1.0000 [drp] | Freq: Every day | OPHTHALMIC | Status: DC
  Administered 2018-07-03 – 2018-07-04 (×2): 1 [drp] via OPHTHALMIC
  Filled 2018-07-03: qty 2.5

## 2018-07-03 MED ORDER — CLOPIDOGREL BISULFATE 75 MG PO TABS
75.0000 mg | ORAL_TABLET | Freq: Every day | ORAL | Status: DC
  Administered 2018-07-04: 75 mg via ORAL
  Filled 2018-07-03: qty 1

## 2018-07-03 MED ORDER — SODIUM CHLORIDE 0.9 % IV SOLN
1.0000 g | Freq: Once | INTRAVENOUS | Status: AC
  Administered 2018-07-03: 1 g via INTRAVENOUS
  Filled 2018-07-03: qty 10

## 2018-07-03 MED ORDER — SODIUM CHLORIDE 0.9 % IV SOLN
1.0000 g | INTRAVENOUS | Status: DC
  Administered 2018-07-04: 18:00:00 1 g via INTRAVENOUS
  Filled 2018-07-03: qty 10
  Filled 2018-07-03: qty 1

## 2018-07-03 MED ORDER — PRAVASTATIN SODIUM 20 MG PO TABS
40.0000 mg | ORAL_TABLET | Freq: Every day | ORAL | Status: DC
  Administered 2018-07-04: 17:00:00 40 mg via ORAL
  Filled 2018-07-03: qty 2

## 2018-07-03 MED ORDER — POLYETHYLENE GLYCOL 3350 17 G PO PACK: 17.0000 g | PACK | Freq: Every day | ORAL | Status: DC | PRN

## 2018-07-03 MED ORDER — SODIUM CHLORIDE 0.9 % IV BOLUS
1000.0000 mL | Freq: Once | INTRAVENOUS | Status: AC
  Administered 2018-07-03: 1000 mL via INTRAVENOUS

## 2018-07-03 MED ORDER — FINASTERIDE 5 MG PO TABS
5.0000 mg | ORAL_TABLET | Freq: Every day | ORAL | Status: DC
  Administered 2018-07-03 – 2018-07-04 (×2): 5 mg via ORAL
  Filled 2018-07-03 (×2): qty 1

## 2018-07-03 MED ORDER — ONDANSETRON HCL 4 MG PO TABS: 4.0000 mg | ORAL_TABLET | Freq: Four times a day (QID) | ORAL | Status: DC | PRN

## 2018-07-03 MED ORDER — ACETAMINOPHEN 650 MG RE SUPP: 650.0000 mg | Freq: Four times a day (QID) | RECTAL | Status: DC | PRN

## 2018-07-03 MED ORDER — INSULIN ASPART 100 UNIT/ML ~~LOC~~ SOLN: 0.0000 [IU] | Freq: Every day | SUBCUTANEOUS | Status: DC

## 2018-07-03 MED ORDER — ACETAMINOPHEN 325 MG PO TABS: 650.0000 mg | ORAL_TABLET | Freq: Four times a day (QID) | ORAL | Status: DC | PRN

## 2018-07-03 MED ORDER — ENOXAPARIN SODIUM 30 MG/0.3ML ~~LOC~~ SOLN
30.0000 mg | SUBCUTANEOUS | Status: DC
  Administered 2018-07-03 – 2018-07-04 (×2): 30 mg via SUBCUTANEOUS
  Filled 2018-07-03 (×2): qty 0.3

## 2018-07-03 MED ORDER — AMLODIPINE BESYLATE 5 MG PO TABS
5.0000 mg | ORAL_TABLET | Freq: Two times a day (BID) | ORAL | Status: DC
  Administered 2018-07-03 – 2018-07-05 (×4): 5 mg via ORAL
  Filled 2018-07-03 (×4): qty 1

## 2018-07-03 MED ORDER — SODIUM CHLORIDE 0.9 % IV SOLN
INTRAVENOUS | Status: DC
  Administered 2018-07-03 – 2018-07-05 (×3): via INTRAVENOUS

## 2018-07-03 MED ORDER — INSULIN ASPART 100 UNIT/ML ~~LOC~~ SOLN
0.0000 [IU] | Freq: Three times a day (TID) | SUBCUTANEOUS | Status: DC
  Administered 2018-07-04: 18:00:00 1 [IU] via SUBCUTANEOUS
  Filled 2018-07-03: qty 1

## 2018-07-03 MED ORDER — TAMSULOSIN HCL 0.4 MG PO CAPS
0.4000 mg | ORAL_CAPSULE | Freq: Every day | ORAL | Status: DC
  Administered 2018-07-04 – 2018-07-05 (×2): 0.4 mg via ORAL
  Filled 2018-07-03 (×2): qty 1

## 2018-07-03 NOTE — H&P (Addendum)
[facility] NAME: Matthew Bruce    MR#:  [id number]  DATE OF BIRTH:  [birth year]  DATE OF ADMISSION:  07/03/2018  PRIMARY CARE PHYSICIAN: Madelyn Brunner, MD   REQUESTING/REFERRING PHYSICIAN: Nance Pear, MD  CHIEF COMPLAINT:   Chief Complaint  Patient presents with  . Altered Mental Status    HISTORY OF PRESENT ILLNESS:  Matthew Bruce  is a 85 y.o. male with a known history of hypertension, hyperlipidemia, type 2 diabetes, CAD with history of MI, BPH, PVD who presented to the ED with altered mental status and fevers that started yesterday during the day.  This morning, daughter noticed that he was "groggy".  He then started "talking out of his head".  Daughter also noticed that he was significantly weaker than normal.  She states that he "felt warm", but they did not check his temperature at home.  He has not had any shortness of breath, cough, abdominal pain, chest pain.  No changes in urine.  At baseline, he does not have any issues with memory.  He is typically alert and oriented x3.  He is able to recommend his family members.  He uses a walker and a wheelchair at baseline.  In the ED, he was meeting sepsis criteria with fever 101.54F, tachycardia and tachypnea.  Labs are significant for sodium 131, creatinine 3.16, lactic acid 1.8, WBC 8.4.  UA with moderate hemoglobin, moderate leukocytes, >50 WBCs, rare bacteria.  Blood and urine cultures were ordered.  Chest x-ray was negative.  Hospitalists were called for admission.  PAST MEDICAL HISTORY:   Past Medical History:  Diagnosis Date  . Anemia   . Aortic valve disorder   . Coronary artery disease   . Diabetes mellitus without complication ([place])   . Glaucoma   . History of BPH   . History of kidney stones   . HOH (hard of hearing)    Bilateral Hearing Aids  . Hyperlipidemia   . Hypertension   . Kidney stones   . MRSA (methicillin resistant staph aureus)  culture positive 04/01/2016   LEFT FOOT  . Myocardial infarction ([place])   . Neuropathy   . Peripheral vascular disease ([place])     PAST SURGICAL HISTORY:   Past Surgical History:  Procedure Laterality Date  . AMPUTATION TOE Left 02/11/2016   Procedure: AMPUTATION TOE;  Surgeon: Albertine Patricia, DPM;  Location: [facility];  Service: Podiatry;  Laterality: Left;  . AMPUTATION TOE Left 08/19/2016   Procedure: AMPUTATION TOE;  Surgeon: Albertine Patricia, DPM;  Location: [facility];  Service: Podiatry;  Laterality: Left;  . APLIGRAFT PLACEMENT Right 08/19/2016   Procedure: APLIGRAFT PLACEMENT;  Surgeon: Albertine Patricia, DPM;  Location: [facility];  Service: Podiatry;  Laterality: Right;  . APPLICATION OF WOUND VAC Left 04/01/2016   Procedure: APPLICATION OF WOUND VAC;  Surgeon: Albertine Patricia, DPM;  Location: [facility];  Service: Podiatry;  Laterality: Left;  . CARDIAC CATHETERIZATION    . CARDIAC SURGERY    . CARDIAC VALVE REPLACEMENT    . CORONARY ARTERY BYPASS GRAFT    . INCISION AND DRAINAGE Right 08/19/2016   Procedure: IRRIGATION AND DEBRIDEMENT RIGHT GREAT TOE;  Surgeon: Albertine Patricia, DPM;  Location: [facility];  Service: Podiatry;  Laterality: Right;  . IRRIGATION AND DEBRIDEMENT FOOT Left 04/01/2016   Procedure: IRRIGATION AND DEBRIDEMENT FOOT;  Surgeon: Albertine Patricia, DPM;  Location: [facility];  Service: Podiatry;  Laterality: Left;  . IRRIGATION AND  DEBRIDEMENT FOOT Left 05/13/2016   Procedure: IRRIGATION AND DEBRIDEMENT FOOT;  Surgeon: Albertine Patricia, DPM;  Location: [facility];  Service: Podiatry;  Laterality: Left;  . LOWER EXTREMITY ANGIOGRAPHY Right 07/06/2016   Procedure: Lower Extremity Angiography;  Surgeon: Algernon Huxley, MD;  Location: [place] [facility];  Service: Cardiovascular;  Laterality: Right;  . LOWER EXTREMITY ANGIOGRAPHY Right 07/18/2016   Procedure: Lower Extremity Angiography;  Surgeon: Algernon Huxley, MD;  Location: [place] [facility];  Service: Cardiovascular;   Laterality: Right;  . OSTECTOMY Left 05/13/2016   Procedure: OSTECTOMY;  Surgeon: Albertine Patricia, DPM;  Location: [facility];  Service: Podiatry;  Laterality: Left;  . PERIPHERAL VASCULAR CATHETERIZATION Left 02/03/2016   Procedure: Lower Extremity Angiography;  Surgeon: Algernon Huxley, MD;  Location: [place] [facility];  Service: Cardiovascular;  Laterality: Left;  . PERIPHERAL VASCULAR CATHETERIZATION Left 04/01/2016   Procedure: Lower Extremity Angiography;  Surgeon: Katha Cabal, MD;  Location: [place] [facility];  Service: Cardiovascular;  Laterality: Left;  Marland Kitchen VALVE REPLACEMENT  2007   Aortic Valve Replacement, St. Jude Porcine Valve    SOCIAL HISTORY:   Social History   Tobacco Use  . Smoking status: Former Smoker    Packs/day: 1.00    Types: Cigarettes  . Smokeless tobacco: Current User    Types: Chew  Substance Use Topics  . Alcohol use: No    FAMILY HISTORY:   Family History  Problem Relation Age of Onset  . Heart attack Mother   . Alcohol abuse Father   . Throat cancer Sister   . Heart attack Brother   . Heart failure Neg Hx     DRUG ALLERGIES:   Allergies  Allergen Reactions  . Sulfa Antibiotics Other (See Comments)    Reaction: Severe Shaking, Weakness    REVIEW OF SYSTEMS:   ROS-unable to obtain secondary to altered mental status  MEDICATIONS AT HOME:   Prior to Admission medications   Medication Sig Start Date End Date Taking? Authorizing Provider  amLODipine (NORVASC) 5 MG tablet Take 5 mg by mouth 2 (two) times daily. 08/25/16   [provider]  clopidogrel (PLAVIX) 75 MG tablet TAKE 1 TABLET BY MOUTH EVERY DAY 04/27/18   Algernon Huxley, MD  finasteride (PROSCAR) 5 MG tablet Take 5 mg by mouth at bedtime.     [provider]  glipiZIDE (GLUCOTROL) 5 MG tablet Take 5 mg by mouth daily. 08/17/16   [provider]  latanoprost (XALATAN) 0.005 % ophthalmic solution Place 1 drop into both eyes at bedtime.    [provider]  levETIRAcetam (KEPPRA) 500 MG tablet Take 1 tablet (500 mg total) by mouth 2 (two) times daily. 09/08/16   Henreitta Leber, MD  lovastatin (MEVACOR) 40 MG tablet Take 40 mg by mouth daily.     [provider]  metFORMIN (GLUCOPHAGE) 1000 MG tablet Take 1,000 mg by mouth 2 (two) times daily with a meal.    [provider]  tamsulosin (FLOMAX) 0.4 MG CAPS capsule Take 1 capsule (0.4 mg total) by mouth daily. 05/28/16   Demetrios Loll, MD      VITAL SIGNS:  Blood pressure (!) 146/67, pulse 97, temperature (!) 100.7 F (38.2 C), temperature source Oral, resp. rate (!) 22, SpO2 95 %.  PHYSICAL EXAMINATION:  Physical Exam  GENERAL:  85 y.o.-year-old patient lying in the bed with no acute distress.  EYES: Pupils equal, round, reactive to light and accommodation. No scleral  icterus. Extraocular muscles intact.  HEENT: Head atraumatic, normocephalic. Oropharynx and nasopharynx clear. + Dry mucous membranes NECK:  Supple, no jugular venous distention. No thyroid enlargement, no tenderness.  LUNGS: Normal breath sounds bilaterally, no wheezing, rales,rhonchi or crepitation. No use of accessory muscles of respiration.  CARDIOVASCULAR: Tachycardic, regular rhythm, S1, S2 normal. No murmurs, rubs, or gallops.  ABDOMEN: Soft, nontender, nondistended. Bowel sounds present. No organomegaly or mass.  EXTREMITIES: No pedal edema, cyanosis, or clubbing.  NEUROLOGIC: Cranial nerves II through XII are intact. + Global weakness sensation intact. Gait not checked.  PSYCHIATRIC: The patient is alert and oriented x 1. SKIN: No obvious rash, lesion, or ulcer.   LABORATORY PANEL:   CBC Recent Labs  Lab 07/03/18 1731  WBC 8.4  HGB 12.0*  HCT 34.6*  PLT 206   ------------------------------------------------------------------------------------------------------------------  Chemistries  Recent Labs  Lab 07/03/18 1731  NA 131*  K 4.5  CL 101  CO2 16*  GLUCOSE 220*  BUN  46*  CREATININE 3.16*  CALCIUM 9.0  AST 19  ALT 12  ALKPHOS 61  BILITOT 0.8   ------------------------------------------------------------------------------------------------------------------  Cardiac Enzymes No results for input(s): TROPONINI in the last 168 hours. ------------------------------------------------------------------------------------------------------------------  RADIOLOGY:  No results found.    IMPRESSION AND PLAN:   Sepsis secondary to UTI- meeting sepsis criteria on admission with tachycardia, tachypnea, and fever to 101.17F. -Continue ceftriaxone -Blood and urine cultures pending -Continue IV fluids  Altered mental status-likely secondary to above.  Oriented x1 (typically oriented x3 at baseline) -Monitor for now -Would consider further work-up if no improvement by tomorrow  AKI in CKD 3- likely due to dehydration and poor p.o. intake.  Creatinine 3.61 (baseline 1.0-1.5). -IV fluids -Renal ultrasound -Avoid nephrotoxic agents -Recheck creatinine in the morning  Hyponatremia- likely due to poor po intake -IVFs -Recheck in the morning  Hypertension- BPs mildly elevated in the ED -Continue home Norvasc  Type 2 diabetes-blood sugars elevated in the ED -SSI -May need to hold home metformin on discharge depending on kidney function  Right upper lobe airspace opacity-incidentally seen on chest x-ray.  Has increased in size compared to prior exam. -Will hold off on ordering CT chest with contrast in the setting of AKI.  Can consider doing this as an outpatient.  History of seizures-no recent seizure-like activity -Continue home Keppra  Hyperlipidemia-stable -Continue home statin  BPH-stable -Continue home finasteride and Flomax  DVT prophylaxis-Lovenox  All the records are reviewed and case discussed with ED provider. Management plans discussed with the patient, family and they are in agreement.  CODE STATUS: Full  TOTAL TIME TAKING CARE  OF THIS PATIENT: 45 minutes.    Berna Spare Mayo M.D on 07/03/2018 at 6:38 PM  Between 7am to 6pm - Pager - [phone]  After 6pm go to [email or web address] - Proofreader  [facility]  Office  [phone]  CC: Primary care physician; Madelyn Brunner, MD   Note: This dictation was prepared with Dragon dictation along with smaller phrase technology. Any transcriptional errors that result from this process are unintentional.

## 2018-07-03 NOTE — ED Notes (Signed)
ED TO INPATIENT HANDOFF REPORT  ED Nurse Name and Phone #: Curly Rim [phone]  S Name/Age/Gender Matthew Bruce 85 y.o. male Room/Bed: ED13A/ED13A  Code Status   Code Status: Prior  Home/SNF/Other Home Patient oriented to: self Is this baseline? No   Triage Complete: Triage complete  Chief Complaint Ams  Triage Note Arrived from home with AMS since las night. Fever last night per EMS report from family. Pt disoriented to year currently. normally ambulatory with walker and oriented per report.  EMS called X 3 to home today to check on pt and they informed family on last call that he should be brought to ED.  Pt very hard of hearing. Pt c/o bilateral leg pain.   Allergies Allergies  Allergen Reactions  . Sulfa Antibiotics Other (See Comments)    Reaction: Severe Shaking, Weakness    Level of Care/Admitting Diagnosis ED Disposition    ED Disposition Condition [facility] Area: [place] [[id number]]  Level of Care: Med-Surg [16]  Diagnosis: Sepsis [facility]) [[id number]]  Admitting Physician: Hyman Bible DODD [[id number]]  Attending Physician: Hyman Bible DODD [[id number]]  Estimated length of stay: past midnight tomorrow  Certification:: I certify this patient will need inpatient services for at least 2 midnights  PT Class (Do Not Modify): Inpatient [101]  PT Acc Code (Do Not Modify): Private [1]       B Medical/Surgery History Past Medical History:  Diagnosis Date  . Anemia   . Aortic valve disorder   . Coronary artery disease   . Diabetes mellitus without complication ([place])   . Glaucoma   . History of BPH   . History of kidney stones   . HOH (hard of hearing)    Bilateral Hearing Aids  . Hyperlipidemia   . Hypertension   . Kidney stones   . MRSA (methicillin resistant staph aureus) culture positive 04/01/2016   LEFT FOOT  . Myocardial infarction ([place])   . Neuropathy   . Peripheral vascular disease [facility])    Past Surgical  History:  Procedure Laterality Date  . AMPUTATION TOE Left 02/11/2016   Procedure: AMPUTATION TOE;  Surgeon: Albertine Patricia, DPM;  Location: [facility];  Service: Podiatry;  Laterality: Left;  . AMPUTATION TOE Left 08/19/2016   Procedure: AMPUTATION TOE;  Surgeon: Albertine Patricia, DPM;  Location: [facility];  Service: Podiatry;  Laterality: Left;  . APLIGRAFT PLACEMENT Right 08/19/2016   Procedure: APLIGRAFT PLACEMENT;  Surgeon: Albertine Patricia, DPM;  Location: [facility];  Service: Podiatry;  Laterality: Right;  . APPLICATION OF WOUND VAC Left 04/01/2016   Procedure: APPLICATION OF WOUND VAC;  Surgeon: Albertine Patricia, DPM;  Location: [facility];  Service: Podiatry;  Laterality: Left;  . CARDIAC CATHETERIZATION    . CARDIAC SURGERY    . CARDIAC VALVE REPLACEMENT    . CORONARY ARTERY BYPASS GRAFT    . INCISION AND DRAINAGE Right 08/19/2016   Procedure: IRRIGATION AND DEBRIDEMENT RIGHT GREAT TOE;  Surgeon: Albertine Patricia, DPM;  Location: [facility];  Service: Podiatry;  Laterality: Right;  . IRRIGATION AND DEBRIDEMENT FOOT Left 04/01/2016   Procedure: IRRIGATION AND DEBRIDEMENT FOOT;  Surgeon: Albertine Patricia, DPM;  Location: [facility];  Service: Podiatry;  Laterality: Left;  . IRRIGATION AND DEBRIDEMENT FOOT Left 05/13/2016   Procedure: IRRIGATION AND DEBRIDEMENT FOOT;  Surgeon: Albertine Patricia, DPM;  Location: [facility];  Service: Podiatry;  Laterality: Left;  . LOWER EXTREMITY ANGIOGRAPHY Right 07/06/2016   Procedure: Lower Extremity Angiography;  Surgeon:  Algernon Huxley, MD;  Location: [place] [facility];  Service: Cardiovascular;  Laterality: Right;  . LOWER EXTREMITY ANGIOGRAPHY Right 07/18/2016   Procedure: Lower Extremity Angiography;  Surgeon: Algernon Huxley, MD;  Location: [place] [facility];  Service: Cardiovascular;  Laterality: Right;  . OSTECTOMY Left 05/13/2016   Procedure: OSTECTOMY;  Surgeon: Albertine Patricia, DPM;  Location: [facility];  Service: Podiatry;  Laterality: Left;  . PERIPHERAL  VASCULAR CATHETERIZATION Left 02/03/2016   Procedure: Lower Extremity Angiography;  Surgeon: Algernon Huxley, MD;  Location: [place] [facility];  Service: Cardiovascular;  Laterality: Left;  . PERIPHERAL VASCULAR CATHETERIZATION Left 04/01/2016   Procedure: Lower Extremity Angiography;  Surgeon: Katha Cabal, MD;  Location: [place] [facility];  Service: Cardiovascular;  Laterality: Left;  Marland Kitchen VALVE REPLACEMENT  2007   Aortic Valve Replacement, St. Jude Porcine Valve     A IV Location/Drains/Wounds Patient Lines/Drains/Airways Status   Active Line/Drains/Airways    Name:   Placement date:   Placement time:   Site:   Days:   Peripheral IV 07/03/18 Left Forearm   07/03/18    -    Forearm   less than 1   Peripheral IV 07/03/18 Right Antecubital   07/03/18    1758    Antecubital   less than 1   Incision (Closed) 08/19/16 Foot Left   08/19/16    1047     683   Pressure Injury 09/07/16 Stage II -  Partial thickness loss of dermis presenting as a shallow open ulcer with a red, pink wound bed without slough. Coccyx stage 2 pressure injury   09/07/16    0538     664   Wound / Incision (Open or Dehisced) 09/07/16 Diabetic ulcer Toe (Comment  which one) Left DM ulcer to left great toe   09/07/16    0541    Toe (Comment  which one)   664   Wound / Incision (Open or Dehisced) 09/07/16 Diabetic ulcer Toe (Comment  which one) Right DM ulcer to right great toe   09/07/16    0542    Toe (Comment  which one)   664          Intake/Output Last 24 hours  Intake/Output Summary (Last 24 hours) at 07/03/2018 2035 Last data filed at 07/03/2018 1905 Gross per 24 hour  Intake 1100 ml  Output -  Net 1100 ml    Labs/Imaging Results for orders placed or performed during the hospital encounter of 07/03/18 (from the past 48 hour(s))  Glucose, capillary     Status: Abnormal   Collection Time: 07/03/18  5:30 PM  Result Value Ref Range   Glucose-Capillary 190 (H) 70 - 99 mg/dL  Comprehensive metabolic panel      Status: Abnormal   Collection Time: 07/03/18  5:31 PM  Result Value Ref Range   Sodium 131 (L) 135 - 145 mmol/L   Potassium 4.5 3.5 - 5.1 mmol/L   Chloride 101 98 - 111 mmol/L   CO2 16 (L) 22 - 32 mmol/L   Glucose, Bld 220 (H) 70 - 99 mg/dL   BUN 46 (H) 8 - 23 mg/dL   Creatinine, Ser 3.16 (H) 0.61 - 1.24 mg/dL   Calcium 9.0 8.9 - 10.3 mg/dL   Total Protein 6.6 6.5 - 8.1 g/dL   Albumin 3.6 3.5 - 5.0 g/dL   AST 19 15 - 41 U/L   ALT 12 0 - 44 U/L   Alkaline Phosphatase  61 38 - 126 U/L   Total Bilirubin 0.8 0.3 - 1.2 mg/dL   GFR calc non Af Amer 17 (L) >60 mL/min   GFR calc Af Amer 19 (L) >60 mL/min   Anion gap 14 5 - 15    Comment: Performed at [facility], [address] [place], [place] [address]  Lactic acid, plasma     Status: None   Collection Time: 07/03/18  5:31 PM  Result Value Ref Range   Lactic Acid, Venous 1.8 0.5 - 1.9 mmol/L    Comment: Performed at [facility], [address] [place], [place] [address]  CBC with Differential     Status: Abnormal   Collection Time: 07/03/18  5:31 PM  Result Value Ref Range   WBC 8.4 4.0 - 10.5 K/uL   RBC 3.92 (L) 4.22 - 5.81 MIL/uL   Hemoglobin 12.0 (L) 13.0 - 17.0 g/dL   HCT 34.6 (L) 39.0 - 52.0 %   MCV 88.3 80.0 - 100.0 fL   MCH 30.6 26.0 - 34.0 pg   MCHC 34.7 30.0 - 36.0 g/dL   RDW 13.0 11.5 - 15.5 %   Platelets 206 150 - 400 K/uL   nRBC 0.0 0.0 - 0.2 %   Neutrophils Relative % 84 %   Neutro Abs 7.0 1.7 - 7.7 K/uL   Lymphocytes Relative 9 %   Lymphs Abs 0.8 0.7 - 4.0 K/uL   Monocytes Relative 6 %   Monocytes Absolute 0.5 0.1 - 1.0 K/uL   Eosinophils Relative 0 %   Eosinophils Absolute 0.0 0.0 - 0.5 K/uL   Basophils Relative 0 %   Basophils Absolute 0.0 0.0 - 0.1 K/uL   Immature Granulocytes 1 %   Abs Immature Granulocytes 0.05 0.00 - 0.07 K/uL    Comment: Performed at [facility], [address] [place], [place] [address]  Protime-INR     Status: Abnormal   Collection Time: 07/03/18   5:31 PM  Result Value Ref Range   Prothrombin Time 15.9 (H) 11.4 - 15.2 seconds   INR 1.3 (H) 0.8 - 1.2    Comment: (NOTE) INR goal varies based on device and disease states. Performed at [facility], [address] [place], [place] [address]   Urinalysis, Complete w Microscopic     Status: Abnormal   Collection Time: 07/03/18  5:31 PM  Result Value Ref Range   Color, Urine YELLOW (A) YELLOW   APPearance HAZY (A) CLEAR   Specific Gravity, Urine 1.015 1.005 - 1.030   pH 6.0 5.0 - 8.0   Glucose, UA 150 (A) NEGATIVE mg/dL   Hgb urine dipstick MODERATE (A) NEGATIVE   Bilirubin Urine NEGATIVE NEGATIVE   Ketones, ur NEGATIVE NEGATIVE mg/dL   Protein, ur 100 (A) NEGATIVE mg/dL   Nitrite NEGATIVE NEGATIVE   Leukocytes,Ua MODERATE (A) NEGATIVE   RBC / HPF 21-50 0 - 5 RBC/hpf   WBC, UA >50 (H) 0 - 5 WBC/hpf   Bacteria, UA RARE (A) NONE SEEN   Squamous Epithelial / LPF NONE SEEN 0 - 5   WBC Clumps PRESENT    Mucus PRESENT     Comment: Performed at [facility], [address], [place], [place] [address]   Dg Chest Portable 1 View  Result Date: 07/03/2018 CLINICAL DATA:  Sepsis. EXAM: PORTABLE CHEST 1 VIEW COMPARISON:  Radiograph September 06, 2016.  CT scan of May 26, 2016. FINDINGS: Stable cardiomegaly. Status post coronary bypass graft. Atherosclerosis of thoracic  aorta is noted. No pneumothorax or pleural effusion is noted. Left lung is clear. Right upper lobe airspace opacity noted on prior exam is increased in size compared to prior exam. Bony thorax is unremarkable. IMPRESSION: Right upper lobe airspace opacity is noted which is increased in size compared to prior exam; CT scan of the chest is recommended to evaluate for possible underlying neoplasm. Aortic Atherosclerosis (ICD10-I70.0). Electronically Signed   By: Marijo Conception, M.D.   On: 07/03/2018 18:47    Pending Labs Unresulted Labs (From admission, onward)    Start     Ordered   07/03/18 1819  Urine  Culture  Add-on,   AD     07/03/18 1819   07/03/18 1722  Culture, blood (Routine x 2)  BLOOD CULTURE X 2,   STAT     07/03/18 1722   Signed and Held  Basic metabolic panel  Tomorrow morning,   R     Signed and Held   Signed and Held  CBC  Tomorrow morning,   R     Signed and Held          Vitals/Pain Today's Vitals   07/03/18 1900 07/03/18 1917 07/03/18 1930 07/03/18 2000  BP: (!) 159/61  134/66 (!) 148/71  Pulse: 90  93 99  Resp: (!) 22  (!) 21 20  Temp:      TempSrc:      SpO2: 96%  94% 91%  Weight:      Height:      PainSc:  8       Isolation Precautions No active isolations  Medications Medications  sodium chloride 0.9 % bolus 1,000 mL (0 mLs Intravenous Stopped 07/03/18 1836)  sodium chloride 0.9 % bolus 1,000 mL (1,000 mLs Intravenous New Bag/Given 07/03/18 1834)  cefTRIAXone (ROCEPHIN) 1 g in sodium chloride 0.9 % 100 mL IVPB (0 g Intravenous Stopped 07/03/18 1905)    Mobility walks with person assist High fall risk   Focused Assessments GU   R Recommendations: See Admitting Provider Note  Report given to:   Additional Notes:

## 2018-07-03 NOTE — ED Provider Notes (Signed)
[facility] Emergency Department Provider Note   ____________________________________________   I have reviewed the triage vital signs and the nursing notes.   HISTORY  Chief Complaint Altered Mental Status   History limited by and level 5 caveat due to AMS HPI Matthew Bruce is a 85 y.o. male who presents to the emergency department today via EMS because of concern for AMS. Apparently family first noticed that the patient had a fever starting yesterday. They also noticed that he has been more confused today. The patient unfortunately cannot give any history.   Records reviewed. Per medical record review patient has a history of HLD, HTN, DM.   Past Medical History:  Diagnosis Date  . Anemia   . Aortic valve disorder   . Coronary artery disease   . Diabetes mellitus without complication ([place])   . Glaucoma   . History of BPH   . History of kidney stones   . HOH (hard of hearing)    Bilateral Hearing Aids  . Hyperlipidemia   . Hypertension   . Kidney stones   . MRSA (methicillin resistant staph aureus) culture positive 04/01/2016   LEFT FOOT  . Myocardial infarction ([place])   . Neuropathy   . Peripheral vascular disease [facility])     Patient Active Problem List   Diagnosis Date Noted  . Seizure ([place]) 09/07/2016  . Pressure injury of skin 05/27/2016  . Elevated lactic acid level 05/26/2016  . Weakness 05/26/2016  . PAD (peripheral artery disease) ([place]) 03/24/2016  . Foot ulceration, left, with necrosis of muscle ([place]) 03/24/2016  . Essential hypertension 02/02/2016  . Diabetes ([place]) 02/02/2016  . Atherosclerotic peripheral vascular disease with ulceration ([place]) 02/02/2016  . Valvular heart disease 02/02/2016  . Embolic stroke involving right cerebellar artery ([place]) 11/10/2014  . Hypoglycemia 11/05/2014    Past Surgical History:  Procedure Laterality Date  . AMPUTATION TOE Left 02/11/2016   Procedure: AMPUTATION TOE;  Surgeon: Albertine Patricia, DPM;  Location: [facility] ORS;  Service: Podiatry;  Laterality: Left;  . AMPUTATION TOE Left 08/19/2016   Procedure: AMPUTATION TOE;  Surgeon: Albertine Patricia, DPM;  Location: [facility] ORS;  Service: Podiatry;  Laterality: Left;  . APLIGRAFT PLACEMENT Right 08/19/2016   Procedure: APLIGRAFT PLACEMENT;  Surgeon: Albertine Patricia, DPM;  Location: [facility] ORS;  Service: Podiatry;  Laterality: Right;  . APPLICATION OF WOUND VAC Left 04/01/2016   Procedure: APPLICATION OF WOUND VAC;  Surgeon: Albertine Patricia, DPM;  Location: [facility] ORS;  Service: Podiatry;  Laterality: Left;  . CARDIAC CATHETERIZATION    . CARDIAC SURGERY    . CARDIAC VALVE REPLACEMENT    . CORONARY ARTERY BYPASS GRAFT    . INCISION AND DRAINAGE Right 08/19/2016   Procedure: IRRIGATION AND DEBRIDEMENT RIGHT GREAT TOE;  Surgeon: Albertine Patricia, DPM;  Location: [facility] ORS;  Service: Podiatry;  Laterality: Right;  . IRRIGATION AND DEBRIDEMENT FOOT Left 04/01/2016   Procedure: IRRIGATION AND DEBRIDEMENT FOOT;  Surgeon: Albertine Patricia, DPM;  Location: [facility] ORS;  Service: Podiatry;  Laterality: Left;  . IRRIGATION AND DEBRIDEMENT FOOT Left 05/13/2016   Procedure: IRRIGATION AND DEBRIDEMENT FOOT;  Surgeon: Albertine Patricia, DPM;  Location: [facility] ORS;  Service: Podiatry;  Laterality: Left;  . LOWER EXTREMITY ANGIOGRAPHY Right 07/06/2016   Procedure: Lower Extremity Angiography;  Surgeon: Algernon Huxley, MD;  Location: [place] CV LAB;  Service: Cardiovascular;  Laterality: Right;  . LOWER EXTREMITY ANGIOGRAPHY Right 07/18/2016   Procedure: Lower Extremity Angiography;  Surgeon: Algernon Huxley, MD;  Location: [place] CV LAB;  Service: Cardiovascular;  Laterality: Right;  . OSTECTOMY Left 05/13/2016   Procedure: OSTECTOMY;  Surgeon: Albertine Patricia, DPM;  Location: [facility] ORS;  Service: Podiatry;  Laterality: Left;  . PERIPHERAL VASCULAR CATHETERIZATION Left 02/03/2016   Procedure: Lower Extremity Angiography;  Surgeon: Algernon Huxley, MD;  Location: [place] CV LAB;  Service: Cardiovascular;  Laterality: Left;  . PERIPHERAL VASCULAR CATHETERIZATION Left 04/01/2016   Procedure: Lower Extremity Angiography;  Surgeon: Katha Cabal, MD;  Location: [place] CV LAB;  Service: Cardiovascular;  Laterality: Left;  Marland Kitchen VALVE REPLACEMENT  2007   Aortic Valve Replacement, [employer] Jude Porcine Valve    Prior to Admission medications   Medication Sig Start Date End Date Taking? Authorizing Provider  amLODipine (NORVASC) 5 MG tablet Take 5 mg by mouth 2 (two) times daily. 08/25/16   [provider]  clopidogrel (PLAVIX) 75 MG tablet TAKE 1 TABLET BY MOUTH EVERY DAY 04/27/18   Algernon Huxley, MD  finasteride (PROSCAR) 5 MG tablet Take 5 mg by mouth at bedtime.     [provider]  glipiZIDE (GLUCOTROL) 5 MG tablet Take 5 mg by mouth daily. 08/17/16   [provider]  latanoprost (XALATAN) 0.005 % ophthalmic solution Place 1 drop into both eyes at bedtime.    [provider]  levETIRAcetam (KEPPRA) 500 MG tablet Take 1 tablet (500 mg total) by mouth 2 (two) times daily. 09/08/16   Henreitta Leber, MD  lovastatin (MEVACOR) 40 MG tablet Take 40 mg by mouth daily.     [provider]  metFORMIN (GLUCOPHAGE) 1000 MG tablet Take 1,000 mg by mouth 2 (two) times daily with a meal.    [provider]  tamsulosin (FLOMAX) 0.4 MG CAPS capsule Take 1 capsule (0.4 mg total) by mouth daily. 05/28/16   Demetrios Loll, MD    Allergies Sulfa antibiotics  Family History  Problem Relation Age of Onset  . Heart attack Mother   . Alcohol abuse Father   . Throat cancer Sister   . Heart attack Brother   . Heart failure Neg Hx     Social History Social History   Tobacco Use  . Smoking status: Former Smoker    Packs/day: 1.00    Types: Cigarettes  . Smokeless tobacco: Current User    Types: Chew  Substance Use Topics  . Alcohol use: No  . Drug use: No    Review of Systems ROS unable to be obtained secondary  to AMS.   ____________________________________________   PHYSICAL EXAM:  VITAL SIGNS: ED Triage Vitals  Enc Vitals Group     BP 07/03/18 1719 (!) 155/74     Pulse Rate 07/03/18 1719 (!) 101     Resp 07/03/18 1719 (!) 24     Temp 07/03/18 1719 (!) 100.7 F (38.2 C)     Temp Source 07/03/18 1719 Oral     SpO2 07/03/18 1719 97 %     Weight --      Height --      Head Circumference --      Peak Flow --      Pain Score 07/03/18 1715 10   Constitutional: Awake and alert, not oriented.  Eyes: Conjunctivae are normal.  ENT      Head: Normocephalic and atraumatic.      Nose: No congestion/rhinnorhea.      Mouth/Throat: Mucous membranes are moist.      Neck: No stridor. Hematological/Lymphatic/Immunilogical: No  cervical lymphadenopathy. Cardiovascular: Tachycardic, regular rhythm.  No murmurs, rubs, or gallops.  Respiratory: Normal respiratory effort without tachypnea nor retractions. Tachypnea. No rhonchi appreciated.  Gastrointestinal: Soft and non tender. No rebound. No guarding.  Genitourinary: Deferred Musculoskeletal: Normal range of motion in all extremities. No lower extremity edema. Neurologic:  Awake and alert, however not oriented to events. Moving all extremities.  Skin:  Skin is warm, dry and intact. No rash noted.  ____________________________________________    LABS (pertinent positives/negatives)  CMP na 131, k 4.5, glu 220, cr 3.16 Lactic 1.8 CBC wbc 8.4, hgb 12.0, plt 206 UA hazy, moderate hgb dipstick, protein 100, leukocytes moderate, 21-50 rbc, wbc >50  ____________________________________________   EKG  I, Nance Pear, attending physician, personally viewed and interpreted this EKG  EKG Time: 1720 Rate: 97 Rhythm: ectopic atrial rhythm Axis: left axis deviation Intervals: qtc 425 QRS: narrow, q waves V1 ST changes: no st elevation Impression: abnormal ekg   ____________________________________________    RADIOLOGY  CXR Persistent  and slightly enlarged right lung opacity  ____________________________________________   PROCEDURES  Procedures  ____________________________________________   INITIAL IMPRESSION / ASSESSMENT AND PLAN / ED COURSE  Pertinent labs & imaging results that were available during my care of the patient were reviewed by me and considered in my medical decision making (see chart for details).   Patient presented to the emergency department today because of concern for ams and fever by family. On initial exam patient's vital signs notable for fever, tachypnea and tachycardia. Patient was not oriented to events. Concern for infection. UA was consistent with infection. Blood work without leukocytosis or lactic acidosis. Blood work however was concerning for AKI. Patient was given IV fluids and antibiotics. Will plan on admission to the hospital.   ____________________________________________   FINAL CLINICAL IMPRESSION(S) / ED DIAGNOSES  Final diagnoses:  Lower urinary tract infectious disease  AKI (acute kidney injury) ([place])  Altered mental status, unspecified altered mental status type     Note: This dictation was prepared with Dragon dictation. Any transcriptional errors that result from this process are unintentional     Nance Pear, MD 07/03/18 [phone]

## 2018-07-03 NOTE — ED Notes (Signed)
Daughter, Wynelle [place] [phone]

## 2018-07-03 NOTE — ED Notes (Signed)
Matthew Bruce

## 2018-07-03 NOTE — Progress Notes (Signed)
Family Meeting Note  Advance Directive:yes  Today a meeting took place with the daughter over the phone.  Patient is unable to participate due to: AMS  The following clinical team members were present during this meeting:MD  The following were discussed:Patient's diagnosis: sepsis 2/2 UTI, Patient's progosis: Unable to determine and Goals for treatment: Full Code   Daughter would like to make patient full code at this time, as he is typically oriented x 3 at baseline and lives a pretty good life.   Additional follow-up to be provided: prn  Time spent during discussion:20 minutes  Evette Doffing, MD

## 2018-07-03 NOTE — ED Notes (Signed)
Mallie Mussel RN, aware of bed assigned

## 2018-07-03 NOTE — ED Triage Notes (Addendum)
Arrived from home with AMS since las night. Fever last night per EMS report from family. Pt disoriented to year currently. normally ambulatory with walker and oriented per report.  EMS called X 3 to home today to check on pt and they informed family on last call that he should be brought to ED.  Pt very hard of hearing. Pt c/o bilateral leg pain.

## 2018-07-04 ENCOUNTER — Inpatient Hospital Stay: Payer: Medicare HMO | Admitting: Certified Registered"

## 2018-07-04 ENCOUNTER — Inpatient Hospital Stay: Payer: Medicare HMO

## 2018-07-04 ENCOUNTER — Encounter
Admission: EM | Disposition: A | Discharge status: Home Health | Payer: Self-pay | Source: Skilled Nursing Facility | Attending: Internal Medicine

## 2018-07-04 DIAGNOSIS — N201 Calculus of ureter: Secondary | ICD-10-CM

## 2018-07-04 DIAGNOSIS — N132 Hydronephrosis with renal and ureteral calculous obstruction: Secondary | ICD-10-CM | POA: Diagnosis not present

## 2018-07-04 DIAGNOSIS — A419 Sepsis, unspecified organism: Secondary | ICD-10-CM | POA: Diagnosis not present

## 2018-07-04 HISTORY — PX: CYSTOSCOPY W/ RETROGRADES: SHX1426

## 2018-07-04 HISTORY — PX: CYSTOSCOPY WITH STENT PLACEMENT: SHX5790

## 2018-07-04 LAB — CBC
HEMATOCRIT: 31.3 % — AB (ref 39.0–52.0)
Hemoglobin: 10.7 g/dL — ABNORMAL LOW (ref 13.0–17.0)
MCH: 30.5 pg (ref 26.0–34.0)
MCHC: 34.2 g/dL (ref 30.0–36.0)
MCV: 89.2 fL (ref 80.0–100.0)
Platelets: 167 10*3/uL (ref 150–400)
RBC: 3.51 MIL/uL — ABNORMAL LOW (ref 4.22–5.81)
RDW: 12.8 % (ref 11.5–15.5)
WBC: 8.4 10*3/uL (ref 4.0–10.5)
nRBC: 0 % (ref 0.0–0.2)

## 2018-07-04 LAB — BASIC METABOLIC PANEL
Anion gap: 11 (ref 5–15)
BUN: 43 mg/dL — ABNORMAL HIGH (ref 8–23)
CHLORIDE: 108 mmol/L (ref 98–111)
CO2: 14 mmol/L — AB (ref 22–32)
Calcium: 8.2 mg/dL — ABNORMAL LOW (ref 8.9–10.3)
Creatinine, Ser: 2.84 mg/dL — ABNORMAL HIGH (ref 0.61–1.24)
GFR calc Af Amer: 22 mL/min — ABNORMAL LOW (ref >60)
GFR calc non Af Amer: 19 mL/min — ABNORMAL LOW (ref >60)
Glucose, Bld: 147 mg/dL — ABNORMAL HIGH (ref 70–99)
POTASSIUM: 4.1 mmol/L (ref 3.5–5.1)
Sodium: 133 mmol/L — ABNORMAL LOW (ref 135–145)

## 2018-07-04 LAB — GLUCOSE, CAPILLARY
Glucose-Capillary: 111 mg/dL — ABNORMAL HIGH (ref 70–99)
Glucose-Capillary: 167 mg/dL — ABNORMAL HIGH (ref 70–99)
Glucose-Capillary: 173 mg/dL — ABNORMAL HIGH (ref 70–99)
Glucose-Capillary: 139 mg/dL — ABNORMAL HIGH (ref 70–99)
Glucose-Capillary: 137 mg/dL — ABNORMAL HIGH (ref 70–99)

## 2018-07-04 SURGERY — CYSTOSCOPY, WITH STENT INSERTION
Anesthesia: General | Site: Ureter | Laterality: Right

## 2018-07-04 MED ORDER — GLYCOPYRROLATE 0.2 MG/ML IJ SOLN
INTRAMUSCULAR | Status: AC
  Filled 2018-07-04: qty 1

## 2018-07-04 MED ORDER — ONDANSETRON HCL 4 MG/2ML IJ SOLN: 4.0000 mg | Freq: Once | INTRAMUSCULAR | Status: DC | PRN

## 2018-07-04 MED ORDER — CIPROFLOXACIN IN D5W 400 MG/200ML IV SOLN
INTRAVENOUS | Status: AC
  Filled 2018-07-04: qty 200

## 2018-07-04 MED ORDER — PROPOFOL 10 MG/ML IV BOLUS
INTRAVENOUS | Status: AC
  Filled 2018-07-04: qty 20

## 2018-07-04 MED ORDER — ONDANSETRON HCL 4 MG/2ML IJ SOLN
INTRAMUSCULAR | Status: DC | PRN
  Administered 2018-07-04: 4 mg via INTRAVENOUS

## 2018-07-04 MED ORDER — FENTANYL CITRATE (PF) 100 MCG/2ML IJ SOLN
INTRAMUSCULAR | Status: AC
  Filled 2018-07-04: qty 2

## 2018-07-04 MED ORDER — FENTANYL CITRATE (PF) 100 MCG/2ML IJ SOLN: 25.0000 ug | INTRAMUSCULAR | Status: DC | PRN

## 2018-07-04 MED ORDER — SODIUM CHLORIDE 0.9 % IV SOLN
500.0000 mg | INTRAVENOUS | Status: DC
  Administered 2018-07-04 – 2018-07-05 (×2): 500 mg via INTRAVENOUS
  Filled 2018-07-04 (×3): qty 500

## 2018-07-04 MED ORDER — IOHEXOL 180 MG/ML  SOLN
INTRAMUSCULAR | Status: DC | PRN
  Administered 2018-07-04: 20 mL

## 2018-07-04 MED ORDER — CIPROFLOXACIN IN D5W 400 MG/200ML IV SOLN
INTRAVENOUS | Status: DC | PRN
  Administered 2018-07-04: 400 mg via INTRAVENOUS

## 2018-07-04 MED ORDER — FENTANYL CITRATE (PF) 100 MCG/2ML IJ SOLN
INTRAMUSCULAR | Status: DC | PRN
  Administered 2018-07-04 (×2): 25 ug via INTRAVENOUS
  Administered 2018-07-04: 50 ug via INTRAVENOUS

## 2018-07-04 MED ORDER — ONDANSETRON HCL 4 MG/2ML IJ SOLN
INTRAMUSCULAR | Status: AC
  Filled 2018-07-04: qty 2

## 2018-07-04 MED ORDER — SUCCINYLCHOLINE CHLORIDE 20 MG/ML IJ SOLN
INTRAMUSCULAR | Status: DC | PRN
  Administered 2018-07-04: 80 mg via INTRAVENOUS

## 2018-07-04 MED ORDER — PROPOFOL 10 MG/ML IV BOLUS
INTRAVENOUS | Status: DC | PRN
  Administered 2018-07-04: 120 mg via INTRAVENOUS

## 2018-07-04 MED ORDER — PHENYLEPHRINE HCL 10 MG/ML IJ SOLN
INTRAMUSCULAR | Status: DC | PRN
  Administered 2018-07-04 (×2): 100 ug via INTRAVENOUS

## 2018-07-04 SURGICAL SUPPLY — 23 items
BAG DRAIN CYSTO-URO LG1000N (MISCELLANEOUS) ×3 IMPLANT
BRUSH SCRUB EZ  4% CHG (MISCELLANEOUS)
BRUSH SCRUB EZ 4% CHG (MISCELLANEOUS) IMPLANT
CATH COUDE FOLEY 2W 5CC 20FR (CATHETERS) ×3 IMPLANT
CATH URETL 5X70 OPEN END (CATHETERS) ×3 IMPLANT
GLOVE BIOGEL PI IND STRL 7.5 (GLOVE) ×1 IMPLANT
GLOVE BIOGEL PI INDICATOR 7.5 (GLOVE) ×2
GOWN STRL REUS W/ TWL LRG LVL3 (GOWN DISPOSABLE) ×1 IMPLANT
GOWN STRL REUS W/ TWL XL LVL3 (GOWN DISPOSABLE) ×1 IMPLANT
GOWN STRL REUS W/TWL LRG LVL3 (GOWN DISPOSABLE) ×2
GOWN STRL REUS W/TWL XL LVL3 (GOWN DISPOSABLE) ×2
GUIDEWIRE STR DUAL SENSOR (WIRE) ×3 IMPLANT
KIT TURNOVER CYSTO (KITS) ×3 IMPLANT
PACK CYSTO AR (MISCELLANEOUS) ×3 IMPLANT
SET CYSTO W/LG BORE CLAMP LF (SET/KITS/TRAYS/PACK) ×3 IMPLANT
SOL .9 NS 3000ML IRR  AL (IV SOLUTION) ×2
SOL .9 NS 3000ML IRR UROMATIC (IV SOLUTION) ×1 IMPLANT
STENT URET 6FRX24 CONTOUR (STENTS) IMPLANT
STENT URET 6FRX26 CONTOUR (STENTS) IMPLANT
STENT URET 6FRX28 CONTOUR (STENTS) ×3 IMPLANT
SURGILUBE 2OZ TUBE FLIPTOP (MISCELLANEOUS) ×3 IMPLANT
SYRINGE IRR TOOMEY STRL 70CC (SYRINGE) IMPLANT
WATER STERILE IRR 1000ML POUR (IV SOLUTION) ×3 IMPLANT

## 2018-07-04 NOTE — Anesthesia Post-op Follow-up Note (Signed)
Anesthesia QCDR form completed.        

## 2018-07-04 NOTE — Op Note (Signed)
Date of procedure: 07/04/18  Preoperative diagnosis:  1. Right 9 mm proximal ureteral stone, sepsis from urinary source  Postoperative diagnosis:  1. Same  Procedure: 1. Cystoscopy, right retrograde pyelogram with intraoperative interpretation, right ureteral stent placement  Surgeon: Nickolas Madrid, MD  Anesthesia: General  Complications: None  Intraoperative findings:  1.  Moderate size prostate, purulent urine in bladder. 2.  3 mm bladder stone evacuated 3.  Uncomplicated right ureteral stent placement  EBL: Minimal  Specimens: None  Drains: Right 6 French by 28 cm ureteral stent, 20 French Foley  Indication: Matthew Bruce is a 85 y.o. patient that presented with altered mental status and fever.  CT this morning showed a right 9 mm proximal ureteral stone with upstream hydronephrosis.  In the setting of an infected obstructed system, informed consent was obtained from his daughter for emergent right ureteral stent placement as the patient remained altered and unable to give consent.  After reviewing the management options for treatment, they elected to proceed with the above surgical procedure(s). We have discussed the potential benefits and risks of the procedure, side effects of the proposed treatment, the likelihood of the patient achieving the goals of the procedure, and any potential problems that might occur during the procedure or recuperation. Informed consent has been obtained.  Description of procedure:  The patient was taken to the operating room and general anesthesia was induced. SCDs were placed for DVT prophylaxis. The patient was placed in the dorsal lithotomy position, prepped and draped in the usual sterile fashion, and preoperative antibiotics(Cipro) were administered. A preoperative time-out was performed.   A 21 French rigid cystoscope with a 30 degree lens was used to intubate the urethra.  Normal-appearing urethra was followed proximally into the  bladder.  The prostate was moderate in size with lateral lobe hypertrophy.  There was purulent urine in the bladder and this was washed free.  There was a 3 mm stone that was evacuated from the bladder, consistent with the small bladder stone on the left side of the bladder seen on CT.  The right ureteral orifice was identified, and a sensor wire was advanced up to the level of the stone.  I was unable to navigate the sensor wire alongside the stone.  A 5 French access catheter was advanced over the wire to the level of the stone, and I was unable to navigate the wire alongside the stone into the collecting system.  The access catheter was advanced over the wire into the collecting system, and the wire removed.  There was a hydronephrotic drip noted of purulent urine.  5 cc of contrast were injected and opacified the upper pole to aid in stent placement.  The wire was replaced, and the access catheter removed.  A 6 French by 28 cm ureteral stent was uneventfully placed over the wire under direct and fluoroscopic vision with a good curl in the upper pole, as well as under direct vision in the bladder.  A 20 French coud Foley was placed to maximize drainage in the setting of an infected system.  Disposition: Stable to PACU  Plan: Maintain Foley until afebrile 24 hours Continue ceftriaxone, narrow as able pending cultures, recommend 2-week course of antibiotics Follow-up for definitive ureteroscopy and laser lithotripsy for right-sided stone in 2 to 3 weeks  Nickolas Madrid, MD

## 2018-07-04 NOTE — Progress Notes (Signed)
OT Cancellation Note  Patient Details Name: Matthew Bruce MRN: [id number] DOB: Jul 06, 1926   Cancelled Treatment:    Reason Eval/Treat Not Completed: Other (comment). OT consult received, chart reviewed. Pt now POD0 s/p cystoscopy, right retrograde pyelogram with intraoperative interpretation, kidney stone evacuation, and right ureteral stent placement. Will hold OT evaluation this afternoon and re-attempt next date as medically appropriate.   Jeni Salles, MPH, MS, OTR/L ascom [phone] 07/04/18, 4:24 PM

## 2018-07-04 NOTE — Plan of Care (Signed)
  Problem: Urinary Elimination: Goal: Signs and symptoms of infection will decrease Outcome: Progressing   Problem: Health Behavior/Discharge Planning: Goal: Ability to manage health-related needs will improve Outcome: Progressing   Problem: Clinical Measurements: Goal: Ability to maintain clinical measurements within normal limits will improve Outcome: Progressing Goal: Will remain free from infection Outcome: Progressing Goal: Respiratory complications will improve Outcome: Progressing   Problem: Activity: Goal: Risk for activity intolerance will decrease Outcome: Progressing   Problem: Nutrition: Goal: Adequate nutrition will be maintained Outcome: Progressing   Problem: Coping: Goal: Level of anxiety will decrease Outcome: Progressing   Problem: Elimination: Goal: Will not experience complications related to bowel motility Outcome: Progressing   Problem: Pain Managment: Goal: General experience of comfort will improve Outcome: Progressing   Problem: Safety: Goal: Ability to remain free from injury will improve Outcome: Progressing   Problem: Skin Integrity: Goal: Risk for impaired skin integrity will decrease Outcome: Progressing

## 2018-07-04 NOTE — Anesthesia Procedure Notes (Signed)
Procedure Name: Intubation Performed by: Rolla Plate, CRNA Pre-anesthesia Checklist: Patient identified, Patient being monitored, Timeout performed, Emergency Drugs available and Suction available Patient Re-evaluated:Patient Re-evaluated prior to induction Oxygen Delivery Method: Circle system utilized Preoxygenation: Pre-oxygenation with 100% oxygen Induction Type: IV induction and Rapid sequence Laryngoscope Size: McGraph and 4 Grade View: Grade I Tube type: Oral Tube size: 7.5 mm Number of attempts: 1 Airway Equipment and Method: Stylet and Video-laryngoscopy Placement Confirmation: ETT inserted through vocal cords under direct vision,  positive ETCO2 and breath sounds checked- equal and bilateral Secured at: 23 cm Tube secured with: Tape Dental Injury: Teeth and Oropharynx as per pre-operative assessment

## 2018-07-04 NOTE — Anesthesia Preprocedure Evaluation (Signed)
Anesthesia Evaluation  Patient identified by MRN, date of birth, ID band Patient awake and Patient confused  General Assessment Comment:Hx reviewed with pt's daughter  Reviewed: Allergy & Precautions, NPO status , Patient's Chart, lab work & pertinent test results  History of Anesthesia Complications Negative for: history of anesthetic complications  Airway Mallampati: III  TM Distance: >3 FB Neck ROM: Full    Dental  (+) Poor Dentition   Pulmonary neg sleep apnea, neg COPD, former smoker,    breath sounds clear to auscultation- rhonchi (-) wheezing      Cardiovascular hypertension, + CAD and + CABG (2007)  + Valvular Problems/Murmurs (s/p AVR 2007)  Rhythm:Regular Rate:Normal - Systolic murmurs and - Diastolic murmurs    Neuro/Psych Seizures -,  CVA negative psych ROS   GI/Hepatic negative GI ROS, Neg liver ROS,   Endo/Other  diabetes, Oral Hypoglycemic Agents  Renal/GU ARFRenal disease     Musculoskeletal negative musculoskeletal ROS (+)   Abdominal (+) - obese,   Peds  Hematology  (+) anemia ,   Anesthesia Other Findings Past Medical History: No date: Anemia No date: Aortic valve disorder No date: Coronary artery disease No date: Diabetes mellitus without complication ([facility]) No date: Glaucoma No date: History of BPH No date: History of kidney stones No date: HOH (hard of hearing)     Comment:  Bilateral Hearing Aids No date: Hyperlipidemia No date: Hypertension No date: Kidney stones 04/01/2016: MRSA (methicillin resistant staph aureus) culture positive     Comment:  LEFT FOOT No date: Myocardial infarction ([place]) No date: Neuropathy No date: Peripheral vascular disease ([facility])   Reproductive/Obstetrics                             Anesthesia Physical Anesthesia Plan  ASA: III and emergent  Anesthesia Plan: General   Post-op Pain Management:    Induction:  Intravenous  PONV Risk Score and Plan: 1 and Ondansetron  Airway Management Planned: Oral ETT  Additional Equipment:   Intra-op Plan:   Post-operative Plan: Extubation in OR  Informed Consent: I have reviewed the patients History and Physical, chart, labs and discussed the procedure including the risks, benefits and alternatives for the proposed anesthesia with the patient or authorized representative who has indicated his/her understanding and acceptance.     Dental advisory given  Plan Discussed with: CRNA and Anesthesiologist  Anesthesia Plan Comments:         Anesthesia Quick Evaluation

## 2018-07-04 NOTE — Anesthesia Postprocedure Evaluation (Signed)
Anesthesia Post Note  Patient: TREYSEAN PETRUZZI  Procedure(s) Performed: CYSTOSCOPY WITH STENT PLACEMENT (Right Ureter) CYSTOSCOPY WITH RETROGRADE PYELOGRAM (Right Ureter)  Patient location during evaluation: PACU Anesthesia Type: General Level of consciousness: awake and alert and oriented Pain management: pain level controlled Vital Signs Assessment: post-procedure vital signs reviewed and stable Respiratory status: spontaneous breathing, nonlabored ventilation and respiratory function stable Cardiovascular status: blood pressure returned to baseline and stable Postop Assessment: no signs of nausea or vomiting Anesthetic complications: no     Last Vitals:  Vitals:   07/04/18 1357 07/04/18 1421  BP: 129/86 139/70  Pulse: 94 95  Resp: 13 18  Temp: 36.9 C 37 C  SpO2: 95% 93%    Last Pain:  Vitals:   07/04/18 1421  TempSrc: Oral  PainSc:                  Mckenzy Salazar

## 2018-07-04 NOTE — Consult Note (Addendum)
07/04/2018 11:43 AM   Matthew Bruce 07/09/1926 [id number]  CC: UTI, sepsis, right hydronephrosis  HPI: I was asked to see [name] in consultation for UTI, sepsis, and right hydronephrosis from Dr. Benjie Karvonen.  The patient continues to have altered mental status and no history is able to be obtained from the patient directly.  From chart review, he is an 85 year old male with CAD, history of stroke, diabetes, BPH, hypertension, peripheral vascular disease who presented last night with altered mental status and fever to 101.7.  Urinalysis was concerning for infection, he was started on antibiotics, and admitted to the hospitalist.  Renal ultrasound this morning showed moderate right hydronephrosis, and follow-up CT stone protocol showed a 9 mm right proximal ureteral stone with upstream hydronephrosis.   PMH: Past Medical History:  Diagnosis Date  . Anemia   . Aortic valve disorder   . Coronary artery disease   . Diabetes mellitus without complication ([place])   . Glaucoma   . History of BPH   . History of kidney stones   . HOH (hard of hearing)    Bilateral Hearing Aids  . Hyperlipidemia   . Hypertension   . Kidney stones   . MRSA (methicillin resistant staph aureus) culture positive 04/01/2016   LEFT FOOT  . Myocardial infarction ([place])   . Neuropathy   . Peripheral vascular disease [facility])     Surgical History: Past Surgical History:  Procedure Laterality Date  . AMPUTATION TOE Left 02/11/2016   Procedure: AMPUTATION TOE;  Surgeon: Albertine Patricia, DPM;  Location: [facility];  Service: Podiatry;  Laterality: Left;  . AMPUTATION TOE Left 08/19/2016   Procedure: AMPUTATION TOE;  Surgeon: Albertine Patricia, DPM;  Location: [facility];  Service: Podiatry;  Laterality: Left;  . APLIGRAFT PLACEMENT Right 08/19/2016   Procedure: APLIGRAFT PLACEMENT;  Surgeon: Albertine Patricia, DPM;  Location: [facility];  Service: Podiatry;  Laterality: Right;  . APPLICATION OF WOUND VAC Left 04/01/2016   Procedure: APPLICATION OF WOUND VAC;  Surgeon: Albertine Patricia, DPM;  Location: [facility];  Service: Podiatry;  Laterality: Left;  . CARDIAC CATHETERIZATION    . CARDIAC SURGERY    . CARDIAC VALVE REPLACEMENT    . CORONARY ARTERY BYPASS GRAFT    . INCISION AND DRAINAGE Right 08/19/2016   Procedure: IRRIGATION AND DEBRIDEMENT RIGHT GREAT TOE;  Surgeon: Albertine Patricia, DPM;  Location: [facility];  Service: Podiatry;  Laterality: Right;  . IRRIGATION AND DEBRIDEMENT FOOT Left 04/01/2016   Procedure: IRRIGATION AND DEBRIDEMENT FOOT;  Surgeon: Albertine Patricia, DPM;  Location: [facility];  Service: Podiatry;  Laterality: Left;  . IRRIGATION AND DEBRIDEMENT FOOT Left 05/13/2016   Procedure: IRRIGATION AND DEBRIDEMENT FOOT;  Surgeon: Albertine Patricia, DPM;  Location: [facility];  Service: Podiatry;  Laterality: Left;  . LOWER EXTREMITY ANGIOGRAPHY Right 07/06/2016   Procedure: Lower Extremity Angiography;  Surgeon: Algernon Huxley, MD;  Location: [place] [facility];  Service: Cardiovascular;  Laterality: Right;  . LOWER EXTREMITY ANGIOGRAPHY Right 07/18/2016   Procedure: Lower Extremity Angiography;  Surgeon: Algernon Huxley, MD;  Location: [place] [facility];  Service: Cardiovascular;  Laterality: Right;  . OSTECTOMY Left 05/13/2016   Procedure: OSTECTOMY;  Surgeon: Albertine Patricia, DPM;  Location: [facility];  Service: Podiatry;  Laterality: Left;  . PERIPHERAL VASCULAR CATHETERIZATION Left 02/03/2016   Procedure: Lower Extremity Angiography;  Surgeon: Algernon Huxley, MD;  Location: [place] [facility];  Service: Cardiovascular;  Laterality: Left;  . PERIPHERAL VASCULAR CATHETERIZATION Left 04/01/2016  Procedure: Lower Extremity Angiography;  Surgeon: Katha Cabal, MD;  Location: [place] [facility];  Service: Cardiovascular;  Laterality: Left;  Marland Kitchen VALVE REPLACEMENT  2007   Aortic Valve Replacement, St. Jude Porcine Valve    Allergies:  Allergies  Allergen Reactions  . Sulfa Antibiotics Other (See Comments)     Reaction: Severe Shaking, Weakness    Family History: Family History  Problem Relation Age of Onset  . Heart attack Mother   . Alcohol abuse Father   . Throat cancer Sister   . Heart attack Brother   . Heart failure Neg Hx     Social History:  reports that he has quit smoking. His smoking use included cigarettes. He smoked 1.00 pack per day. His smokeless tobacco use includes chew. He reports that he does not drink alcohol or use drugs.  ROS: Unable to be obtained secondary to patient condition  Physical Exam: BP (!) 149/75 (BP Location: Right Arm)   Pulse 93   Temp 98.1 F (36.7 C) (Oral)   Resp (!) 22   Ht 6' (1.829 m)   Wt 80.7 kg   SpO2 93%   BMI 24.13 kg/m    Constitutional: Disoriented, minimally responsive to questions Cardiovascular: Regular rate and rhythm Respiratory: Clear bilaterally GI: Abdomen is soft, nontender, nondistended, no abdominal masses GU: No CVA tenderness Lymph: No cervical or inguinal lymphadenopathy. Skin: No rashes, bruises or suspicious lesions. Neurologic: Grossly intact, no focal deficits, moving all 4 extremities. Psychiatric: Normal mood and affect.  Laboratory Data: Reviewed  Urinalysis concerning for infection, greater than 50 WBCs, WBC clumps, rare bacteria  Pertinent Imaging: I have personally reviewed the CT stone protocol today.  Right 9 mm proximal ureteral stone with upstream hydronephrosis, there is a small stone in the left side of the bladder, there is no left hydronephrosis and this does not appear to be within the left distal ureter  Assessment & Plan:   In summary, the patient is an 85 year old comorbid male who was admitted overnight with altered mental status and fever to 101.7, urinalysis concerning for infection, and with CT scan today showing a 9 mm right proximal ureteral stone.    He continues to be altered and I am unable to have a discussion with the patient today.  I had a long discussion over the phone with  his daughter Matthew Bruce explaining his status is secondary to an infected and obstructed right ureteral stone.  This requires emergent drainage.  In the setting of his Plavix use, I strongly recommended proceeding with an emergent right ureteral stent and Foley placement with continued antibiotics.  We discussed the risks and benefits of this procedure at length, including bleeding, infection, stent related symptoms, and need for definitive management in the future.  Informed consent was obtained.  Proceed with emergent right ureteral stent placement. Will look for bladder stone and/or perform left retrograde, possible left stent placement if stone in left distal ureter Continue antibiotics, appreciate hospitalist care, follow-up urine culture Will need definitive management with ureteroscopy in 2 to 3 weeks when infection treated  [place], [place] [address], [place], [place] [address] [phone]

## 2018-07-04 NOTE — Progress Notes (Signed)
[name] NAME: Matthew Bruce    MR#:  [id number]  DATE OF BIRTH:  [birth year]  SUBJECTIVE:   patient is sitting up sleeping NAD   REVIEW OF SYSTEMS:    Unable to obtain due to patient sleeping   Tolerating Diet: yes      DRUG ALLERGIES:   Allergies  Allergen Reactions  . Sulfa Antibiotics Other (See Comments)    Reaction: Severe Shaking, Weakness    VITALS:  Blood pressure (!) 149/75, pulse 93, temperature 98.1 F (36.7 C), temperature source Oral, resp. rate (!) 22, height 6' (1.829 m), weight 80.7 kg, SpO2 93 %.  PHYSICAL EXAMINATION:  Constitutional: Appears well-developed and well-nourished. No distress. HENT: Normocephalic. Marland Kitchen Oropharynx is clear and moist.  Eyes: Conjunctivae and EOM are normal. PERRLA, no scleral icterus.  Neck: Normal ROM. Neck supple. No JVD. No tracheal deviation. CVS: RRR, S1/S2 +, no murmurs, no gallops, no carotid bruit.  Pulmonary: Effort and breath sounds normal, no stridor, rhonchi, wheezes, rales.  Abdominal: Soft. BS +,  no distension, tenderness, rebound or guarding.  Musculoskeletal: . No edema and no tenderness.  Neuro: sleeping  Skin: Skin is warm and dry. No rash noted. Psychiatric: sleeping    LABORATORY PANEL:   CBC Recent Labs  Lab 07/04/18 0334  WBC 8.4  HGB 10.7*  HCT 31.3*  PLT 167   ------------------------------------------------------------------------------------------------------------------  Chemistries  Recent Labs  Lab 07/03/18 1731 07/04/18 0334  NA 131* 133*  K 4.5 4.1  CL 101 108  CO2 16* 14*  GLUCOSE 220* 147*  BUN 46* 43*  CREATININE 3.16* 2.84*  CALCIUM 9.0 8.2*  AST 19  --   ALT 12  --   ALKPHOS 61  --   BILITOT 0.8  --    ------------------------------------------------------------------------------------------------------------------  Cardiac Enzymes No results for input(s): TROPONINI in the last 168  hours. ------------------------------------------------------------------------------------------------------------------  RADIOLOGY:  US Renal  Result Date: 07/04/2018 CLINICAL DATA:  85 year old male with a history of sepsis. EXAM: RENAL / URINARY TRACT ULTRASOUND COMPLETE COMPARISON:  CT 05/25/2016 FINDINGS: Right Kidney: Length: 11.5 cm x 6.3 cm x 6.0 cm, 228 cc. Echogenicity of the kidney similar to that of the adjacent liver parenchyma. Pelvicaliectasis. Flow confirmed in the hilum of the right kidney. There are 2 cystic structures with through transmission and anechoic features compatible with Bosniak 1 cysts, larger measuring 3.7 cm. This is unchanged from the prior CT. Left Kidney: Length: 10.5 cm x 5.8 cm x 5.2 cm, 166 cc. Echogenicity similar to the contralateral side. No evidence of hydronephrosis. Flow confirmed in the hilum of the right kidney. Bladder: Bladder partially distended. Only the left ureteral jet identified. Prominent median lobe of the prostate. IMPRESSION: Right-sided hydronephrosis with absent right ureteral jet at the bladder, potentially secondary to right ureteral obstruction. Further evaluation with CT may be useful. Electronically Signed   By: Corrie Mckusick D.O.   On: 07/04/2018 08:53   Dg Chest Portable 1 View  Result Date: 07/03/2018 CLINICAL DATA:  Sepsis. EXAM: PORTABLE CHEST 1 VIEW COMPARISON:  Radiograph September 06, 2016.  CT scan of May 26, 2016. FINDINGS: Stable cardiomegaly. Status post coronary bypass graft. Atherosclerosis of thoracic aorta is noted. No pneumothorax or pleural effusion is noted. Left lung is clear. Right upper lobe airspace opacity noted on prior exam is increased in size compared to prior exam. Bony thorax is unremarkable. IMPRESSION: Right upper lobe airspace opacity is noted which is increased in size  compared to prior exam; CT scan of the chest is recommended to evaluate for possible underlying neoplasm. Aortic Atherosclerosis (ICD10-I70.0).  Electronically Signed   By: Marijo Conception, M.D.   On: 07/03/2018 18:47     ASSESSMENT AND PLAN:    85 year old male with a history of type 2 diabetes, CAD, BPH who presented to the emergency room due to fevers and altered mental status.  1.  Acute metabolic encephalopathy in the setting of sepsis due to urinary tract infection and possible community-acquired pneumonia Patient meets sepsis criteria with fever and leukocytosis.   2.  Acute kidney injury on chronic kidney disease stage III with baseline creatinine 1.6-1.8: Ultrasound is concerning for right sided hydronephrosis attentionally secondary to right ureteral obstruction Unable to obtain CT with contrast due to acute kidney injury. Consultation with urology placed via epic Creatinine has slightly improved. Hold nephrotoxic medications  3.  Community-acquired pneumonia: Continue Rocephin and azithromycin Chest x-ray concerning for right upper lobe airspace opacity and CT is recommended to evaluate for possible underlying neoplasm. Due to elevated creatinine this cannot be performed at this time. This may be entertained as an outpatient  4.  Urinary tract infection: Continue Rocephin and follow-up on final cultures  5.  Hyponatremia: This is improved with IV fluids  6.  Essential hypertension: Continue Norvasc  7.  BPH: Continue Proscar and Flomax  follow-up on urology recommendations  8.  Diabetes: Continue sliding scale  9. History of seizure: Continue Keppra  Management plans discussed with nursing CODE STATUS: FULL  TOTAL TIME TAKING CARE OF THIS PATIENT: 30 minutes.     POSSIBLE D/C 2-4 days, DEPENDING ON CLINICAL CONDITION.   Bettey Costa M.D on 07/04/2018 at 10:10 AM  Between 7am to 6pm - Pager - [phone] After 6pm go to [email or web address] - password [email or web address] [facility]  Office  [phone]  CC: Primary care physician; Madelyn Brunner, MD  Note: This dictation was prepared with  Dragon dictation along with smaller phrase technology. Any transcriptional errors that result from this process are unintentional.

## 2018-07-04 NOTE — Evaluation (Signed)
Physical Therapy Evaluation Patient Details Name: Matthew Bruce MRN: [id number] DOB: 21-Feb-1927 Today's Date: 07/04/2018   History of Present Illness  Patient is a 85 year old male who presented to the hospital with AMS and fever. Admitted to hospital with diagnosis of sepsis, UTI, AMS. PMH includes seizures (well controlled at this point), PVD, AKI in CKD 3, HTN, MI, DMII. Imaging shows right upper lobe airspace opacity increased from previous exam (noted supicious for neoplasm).     Clinical Impression  Prior to hospitalization, pt was A&Ox3 and ambulated using walker with assistance or w/c for longer distances. Upon evaluation, patient requires max A for bed mobility, bed to chair transfers, and is unable to ambulate at this time. Pt required +2 assistance for weight bearing activities. Patient oriented only to person and demonstrates difficulty following commands. Pt fails to fully extend knees and hips or use AD appropriately during transfer attempts. Patient has experienced a significant decrease in his functional independence compared to baseline and would benefit from rehabilitation to allow him to return to PLOF and maximal functional independence. Vitals remained WFL during session.     Follow Up Recommendations SNF    Equipment Recommendations  Other (comment)(to be determined in next venue)    Recommendations for Other Services OT consult     Precautions / Restrictions Precautions Precautions: Fall Restrictions Weight Bearing Restrictions: No      Mobility  Bed Mobility Overal bed mobility: Needs Assistance Bed Mobility: Supine to Sit     Supine to sit: Max assist     General bed mobility comments: pt requires assistance with trunk and to advance feet off bed  Transfers Overall transfer level: Needs assistance Equipment used: Rolling walker (2 wheeled) Transfers: Set designer Transfers;Sit to/from Stand Sit to Stand: +2 physical assistance;Mod assist   Squat  pivot transfers: Max assist;+2 safety/equipment     General transfer comment: attempted sit <> stand transfer to RW from elevated bed mod A with +2 assist. pt failed to fully extend knees and hips, push off/reach back with BUE, or shift forward adquately. SPT bed - chair max A with second person bracing chair.   Ambulation/Gait             General Gait Details: unable at this time  Stairs            Wheelchair Mobility    Modified Rankin (Stroke Patients Only)       Balance Overall balance assessment: Needs assistance Sitting-balance support: Bilateral upper extremity supported Sitting balance-Leahy Scale: Poor   Postural control: Posterior lean Standing balance support: Bilateral upper extremity supported Standing balance-Leahy Scale: Poor                               Pertinent Vitals/Pain Pain Assessment: No/denies pain    Home Living Family/patient expects to be discharged to:: Private residence Living Arrangements: Children Available Help at Discharge: Family             Additional Comments: walker, unknown type    Prior Function Level of Independence: Needs assistance(patient unable to provide, info derived from chart review)   Gait / Transfers Assistance Needed: ambulates with assistance using walker and W/C for mobility.  ADL's / Homemaking Assistance Needed: help with IADLs,.   Comments: Patient oriented only to self. Unable to provide reliable history. Information gathered from chart review.      Hand Dominance  Extremity/Trunk Assessment   Upper Extremity Assessment Upper Extremity Assessment: Generalized weakness    Lower Extremity Assessment Lower Extremity Assessment: Generalized weakness    Cervical / Trunk Assessment Cervical / Trunk Assessment: Other exceptions Cervical / Trunk Exceptions: unable to hold up trunk in sitting without assistance. Tremor noted with movement.   Communication    Communication: HOH  Cognition Arousal/Alertness: Awake/alert Behavior During Therapy: Anxious Overall Cognitive Status: Impaired/Different from baseline                                 General Comments: patient oriented to self only. per chart review, he is usually oriented x3.       General Comments      Exercises Other Exercises Other Exercises: practiced trunk stability in seated position, transfer techniques including hand placement, forward shift, full LE extension in standing. required max cuing    Assessment/Plan    PT Assessment Patient needs continued PT services  PT Problem List Decreased strength;Decreased range of motion;Decreased activity tolerance;Decreased balance;Decreased mobility;Decreased coordination;Decreased cognition;Decreased knowledge of use of DME;Decreased safety awareness;Decreased knowledge of precautions       PT Treatment Interventions DME instruction;Functional mobility training;Balance training;Patient/family education;Gait training;Therapeutic activities;Neuromuscular re-education;Stair training;Therapeutic exercise    PT Goals (Current goals can be found in the Care Plan section)  Acute Rehab PT Goals PT Goal Formulation: Patient unable to participate in goal setting    Frequency Min 2X/week   Barriers to discharge Decreased caregiver support      Co-evaluation               AM-PAC PT "6 Clicks" Mobility  Outcome Measure Help needed turning from your back to your side while in a flat bed without using bedrails?: A Lot Help needed moving from lying on your back to sitting on the side of a flat bed without using bedrails?: A Lot Help needed moving to and from a bed to a chair (including a wheelchair)?: A Lot Help needed standing up from a chair using your arms (e.g., wheelchair or bedside chair)?: A Lot Help needed to walk in hospital room?: Total Help needed climbing 3-5 steps with a railing? : Total 6 Click Score:  10    End of Session Equipment Utilized During Treatment: Gait belt Activity Tolerance: Patient tolerated treatment well;Patient limited by fatigue Patient left: in chair;with call bell/phone within reach;with bed alarm set Nurse Communication: Mobility status;Other (comment)(results of session) PT Visit Diagnosis: Unsteadiness on feet (R26.81);Other abnormalities of gait and mobility (R26.89);Muscle weakness (generalized) (M62.81);Difficulty in walking, not elsewhere classified (R26.2)    Time: 1130-1210 PT Time Calculation (min) (ACUTE ONLY): 40 min   Charges:   PT Evaluation $PT Eval Moderate Complexity: 1 Mod PT Treatments $Therapeutic Activity: 23-37 mins        Everlean Alstrom. Graylon Good, PT, DPT 07/04/18, 1:14 PM

## 2018-07-04 NOTE — Transfer of Care (Signed)
Immediate Anesthesia Transfer of Care Note  Patient: Matthew Bruce  Procedure(s) Performed: CYSTOSCOPY WITH STENT PLACEMENT (Right Ureter) CYSTOSCOPY WITH RETROGRADE PYELOGRAM (Right Ureter)  Patient Location: PACU  Anesthesia Type:General  Level of Consciousness: awake  Airway & Oxygen Therapy: Patient Spontanous Breathing and Patient connected to face mask oxygen  Post-op Assessment: Report given to RN and Post -op Vital signs reviewed and stable  Post vital signs: Reviewed  Last Vitals:  Vitals Value Taken Time  BP 124/70 07/04/2018  1:08 PM  Temp    Pulse 94 07/04/2018  1:08 PM  Resp 16 07/04/2018  1:08 PM  SpO2 100 % 07/04/2018  1:08 PM  Vitals shown include unvalidated device data.  Last Pain:  Vitals:   07/04/18 1202  TempSrc:   PainSc: 0-No pain         Complications: No apparent anesthesia complications

## 2018-07-05 ENCOUNTER — Encounter: Payer: Self-pay | Admitting: Urology

## 2018-07-05 LAB — URINE CULTURE: Culture: <10000 — AB

## 2018-07-05 LAB — GLUCOSE, CAPILLARY
Glucose-Capillary: 77 mg/dL (ref 70–99)
Glucose-Capillary: 145 mg/dL — ABNORMAL HIGH (ref 70–99)

## 2018-07-05 LAB — BASIC METABOLIC PANEL
Anion gap: 9 (ref 5–15)
BUN: 37 mg/dL — ABNORMAL HIGH (ref 8–23)
CO2: 15 mmol/L — ABNORMAL LOW (ref 22–32)
Calcium: 8.3 mg/dL — ABNORMAL LOW (ref 8.9–10.3)
Chloride: 111 mmol/L (ref 98–111)
Creatinine, Ser: 1.94 mg/dL — ABNORMAL HIGH (ref 0.61–1.24)
GFR calc Af Amer: 35 mL/min — ABNORMAL LOW (ref >60)
GFR calc non Af Amer: 30 mL/min — ABNORMAL LOW (ref >60)
Glucose, Bld: 116 mg/dL — ABNORMAL HIGH (ref 70–99)
Potassium: 3.6 mmol/L (ref 3.5–5.1)
Sodium: 135 mmol/L (ref 135–145)

## 2018-07-05 MED ORDER — CEPHALEXIN 250 MG PO CAPS: 250.0000 mg | ORAL_CAPSULE | Freq: Two times a day (BID) | ORAL | 0 refills | Status: AC

## 2018-07-05 MED ORDER — AZITHROMYCIN 250 MG PO TABS: 250.0000 mg | ORAL_TABLET | Freq: Every day | ORAL | 0 refills | Status: AC

## 2018-07-05 MED ORDER — CEPHALEXIN 250 MG PO CAPS
250.0000 mg | ORAL_CAPSULE | Freq: Two times a day (BID) | ORAL | Status: DC
  Administered 2018-07-05: 10:00:00 250 mg via ORAL
  Filled 2018-07-05 (×2): qty 1

## 2018-07-05 NOTE — Progress Notes (Signed)
Pt is being discharged to home with daughter Ferguson. Pt is Aox2, VSS, pt does not c/o any pain at this time. AVS with RX was given and explained to the pt and his daughter and they each verbalized understanding of all information.

## 2018-07-05 NOTE — TOC Transition Note (Signed)
Transition of Care [facility]) - CM/SW Discharge Note   Patient Details  Name: Matthew Bruce MRN: [id number] Date of Birth: Mar 04, 1927  [facility]) CM/SW Contact:  Annamaria Boots, [place] Phone Number: 07/05/2018, 9:29 AM   Clinical Narrative:   Patient is medically ready for discharge today. Patient will go home with home health through [facility]. Per Judson Roch at [facility], she will pull DC summary and orders from Epic. CSW notified daughter of discharge today. Daughter reports that family can pick him up and transport home today. Daughter also reports that patient already has a walker and wheelchair at home and has no DME needs.     Final next level of care: [place] Barriers to Discharge: No Barriers Identified   Patient Goals and CMS Choice Patient states their goals for this hospitalization and ongoing recovery are:: return home  CMS Medicare.gov Compare Post Acute Care list provided to:: Patient Represenative (must comment)(Daughter- [name] ) Choice offered to / list presented to : Adult Children  Discharge Placement                  Name of family member notified: [name]- daughter  Patient and family notified of of transfer: 07/05/18  Discharge Plan and Services     Post Acute Care Choice: Home Health              HH Arranged: RN, PT, OT, Nurse's Aide, Social Work [employer] Agency: [employer]   Social Determinants of Health (SDOH) Interventions     Readmission Risk Interventions No flowsheet data found.

## 2018-07-05 NOTE — Progress Notes (Signed)
Physical Therapy Treatment Patient Details Name: Matthew Bruce MRN: [id number] DOB: [birth year] Today's Date: 07/05/2018    History of Present Illness Patient is a 85 year old male who presented to the hospital with AMS and fever. Admitted to hospital with diagnosis of sepsis, UTI, AMS. PMH includes seizures (well controlled at this point), PVD, AKI in CKD 3, HTN, MI, DMII. Imaging shows right upper lobe airspace opacity increased from previous exam (noted supicious for neoplasm).     PT Comments    Patient agreeable to PT, much improved cognitive status this session. Pt able to mobilize to EOB with minAx1. Sit <> stand with minA as well with RW. Able to transfer to chair. After seated rest break, pt ambulated ~44ft with RW, close chair follow, and CGA-minA. Patient intermittently needed minA for mild LOB. Overall the patient demonstrated much improved mobility, discharge plans updated to HHPT with supervision for OOB/mobility.      Follow Up Recommendations  Home health PT;Supervision for mobility/OOB     Equipment Recommendations  None recommended by PT    Recommendations for Other Services OT consult     Precautions / Restrictions Precautions Precautions: Fall Restrictions Weight Bearing Restrictions: No    Mobility  Bed Mobility Overal bed mobility: Needs Assistance Bed Mobility: Supine to Sit     Supine to sit: Min assist     General bed mobility comments: minA for trunk elevation, minA to scoot anteriorly towards edge of bed, pt able to initiate and attempt all movements prior to assistance  Transfers Overall transfer level: Needs assistance Equipment used: Rolling walker (2 wheeled) Transfers: Sit to/from Stand Sit to Stand: Min assist;From elevated surface            Ambulation/Gait Ambulation/Gait assistance: Min guard;Min assist;+2 safety/equipment Gait Distance (Feet): 65 Feet Assistive device: Rolling walker (2 wheeled)       General Gait  Details: cues for AD management, CGA-minA for intermittent instances of mild LOB during ambulation. Close chair follow for safety.   Stairs             Wheelchair Mobility    Modified Rankin (Stroke Patients Only)       Balance Overall balance assessment: Needs assistance Sitting-balance support: Bilateral upper extremity supported Sitting balance-Leahy Scale: Fair     Standing balance support: Bilateral upper extremity supported Standing balance-Leahy Scale: Poor                              Cognition Arousal/Alertness: Awake/alert Behavior During Therapy: WFL for tasks assessed/performed Overall Cognitive Status: No family/caregiver present to determine baseline cognitive functioning                                 General Comments: Pt oriented to self, able to describe PLOF a bit better this AM, no family at bedside to confirm.      Exercises General Exercises - Lower Extremity Heel Slides: AROM;Both;10 reps Straight Leg Raises: AROM;Both;10 reps    General Comments        Pertinent Vitals/Pain Pain Assessment: No/denies pain    Home Living                      Prior Function            PT Goals (current goals can now be found in the care plan section) Acute  Rehab PT Goals Patient Stated Goal: to get up PT Goal Formulation: With patient Time For Goal Achievement: 07/19/18 Potential to Achieve Goals: Good    Frequency    Min 2X/week      PT Plan Discharge plan needs to be updated    Co-evaluation              AM-PAC PT "6 Clicks" Mobility   Outcome Measure  Help needed turning from your back to your side while in a flat bed without using bedrails?: A Little Help needed moving from lying on your back to sitting on the side of a flat bed without using bedrails?: A Little Help needed moving to and from a bed to a chair (including a wheelchair)?: A Little Help needed standing up from a chair using  your arms (e.g., wheelchair or bedside chair)?: A Little Help needed to walk in hospital room?: A Little Help needed climbing 3-5 steps with a railing? : A Lot 6 Click Score: 17    End of Session Equipment Utilized During Treatment: Gait belt Activity Tolerance: Patient tolerated treatment well Patient left: in chair;with chair alarm set Nurse Communication: Mobility status(need of call  bell) PT Visit Diagnosis: Unsteadiness on feet (R26.81);Other abnormalities of gait and mobility (R26.89);Muscle weakness (generalized) (M62.81);Difficulty in walking, not elsewhere classified (R26.2)     Time: 4287-6811 PT Time Calculation (min) (ACUTE ONLY): 18 min  Charges:  $Therapeutic Activity: 8-22 mins                     Lieutenant Diego PT, DPT 1:35 PM,07/05/18 [phone]

## 2018-07-05 NOTE — TOC Initial Note (Signed)
Transition of Care [facility]) - Initial/Assessment Note    Patient Details  Name: Matthew Bruce MRN: [id number] Date of Birth: July 30, 1926  [facility] CM/SW Contact:    Annamaria Boots, [place] Phone Number: 07/05/2018, 9:18 AM  Clinical Narrative:  Patient has been recommended for SNF. CSW spoke with patient's daughter Matthew Bruce. Daughter states that patient lives alone but family assists with care and they have a nurse from [facility] that comes out to see him as well. CSW explained PT recommendation of SNF. Daughter states that she would rather take patient home since SNF are not allowing families to visit. Daughter reports that she would like patient to have home health through [facility]. CSW notified Judson Roch at [facility] of referral. Judson Roch states that they are already open with this patient and will begin services. Patient is expected to discharge today. TOC team will continue to follow                  Expected Discharge Plan: [place] Barriers to Discharge: No Barriers Identified   Patient Goals and CMS Choice Patient states their goals for this hospitalization and ongoing recovery are:: Daughter states that she wants patient to return home  CMS Medicare.gov Compare Post Acute Care list provided to:: Patient Represenative (must comment)(Daughter Matthew Bruce ) Choice offered to / list presented to : Adult Children  Expected Discharge Plan and Services Expected Discharge Plan: [place] Choice: [place] arrangements for the past 2 months: Single Family Home Expected Discharge Date: 07/05/18                   HH Arranged: RN, PT, OT, Nurse's Aide [place] Agency: [place]  Prior Living Arrangements/Services Living arrangements for the past 2 months: Single Family Home Lives with:: Self Patient language and need for interpreter reviewed:: Yes Do you  feel safe going back to the place where you live?: Yes      Need for Family Participation in Patient Care: Yes (Comment) Care giver support system in place?: Yes (comment) Current home services: Home RN Criminal Activity/Legal Involvement Pertinent to Current Situation/Hospitalization: No - Comment as needed  Activities of Daily Living Home Assistive Devices/Equipment: Gilford Rile (specify type) ADL Screening (condition at time of admission) Patient's cognitive ability adequate to safely complete daily activities?: Yes Is the patient deaf or have difficulty hearing?: Yes(bilateral) Does the patient have difficulty seeing, even when wearing glasses/contacts?: No Does the patient have difficulty concentrating, remembering, or making decisions?: No Patient able to express need for assistance with ADLs?: Yes Does the patient have difficulty dressing or bathing?: Yes Independently performs ADLs?: No Communication: Independent Dressing (OT): Needs assistance Is this a change from baseline?: Change from baseline, expected to last <3days Grooming: Needs assistance Is this a change from baseline?: Change from baseline, expected to last <3 days Feeding: Independent Bathing: Needs assistance Is this a change from baseline?: Change from baseline, expected to last <3 days Toileting: Needs assistance Is this a change from baseline?: Change from baseline, expected to last <3 days In/Out Bed: Needs assistance Is this a change from baseline?: Change from baseline, expected to last >3 days Walks in Home: Needs assistance Is this a change from baseline?: Change from baseline, expected to last >3 days Does the patient have difficulty walking or climbing stairs?: Yes Weakness of Legs: Both Weakness of Arms/Hands: Both  Permission Sought/Granted                  Emotional Assessment Appearance:: Appears stated age   Affect (typically observed): Pleasant Orientation: : Oriented to Self Alcohol /  Substance Use: Not Applicable Psych Involvement: No (comment)  Admission diagnosis:  Lower urinary tract infectious disease [N39.0] AKI (acute kidney injury) ([place]) [N17.9] Altered mental status, unspecified altered mental status type [R41.82] Patient Active Problem List   Diagnosis Date Noted  . Sepsis ([place]) 07/03/2018  . Seizure ([place]) 09/07/2016  . Pressure injury of skin 05/27/2016  . Elevated lactic acid level 05/26/2016  . Weakness 05/26/2016  . PAD (peripheral artery disease) ([place]) 03/24/2016  . Foot ulceration, left, with necrosis of muscle ([place]) 03/24/2016  . Essential hypertension 02/02/2016  . Diabetes ([place]) 02/02/2016  . Atherosclerotic peripheral vascular disease with ulceration ([place]) 02/02/2016  . Valvular heart disease 02/02/2016  . Embolic stroke involving right cerebellar artery ([place]) 11/10/2014  . Hypoglycemia 11/05/2014   PCP:  Madelyn Brunner, MD Pharmacy:   [id number] - [place], [place] [address] [place] [address] Phone: [phone] Fax: [phone]     Social Determinants of Health (SDOH) Interventions    Readmission Risk Interventions No flowsheet data found.

## 2018-07-05 NOTE — Discharge Summary (Signed)
[facility] [place] NAME: Matthew Bruce    MR#:  [id number]  DATE OF BIRTH:  04-27-1926  DATE OF ADMISSION:  07/03/2018 ADMITTING PHYSICIAN: Sela Hua, MD  DATE OF DISCHARGE: 07/05/2018  PRIMARY CARE PHYSICIAN: [name], MD    ADMISSION DIAGNOSIS:  Lower urinary tract infectious disease [N39.0] AKI (acute kidney injury) ([place]) [N17.9] Altered mental status, unspecified altered mental status type [R41.82]  DISCHARGE DIAGNOSIS:  Active Problems:   Sepsis ([place])   SECONDARY DIAGNOSIS:   Past Medical History:  Diagnosis Date  . Anemia   . Aortic valve disorder   . Coronary artery disease   . Diabetes mellitus without complication ([place])   . Glaucoma   . History of BPH   . History of kidney stones   . HOH (hard of hearing)    Bilateral Hearing Aids  . Hyperlipidemia   . Hypertension   . Kidney stones   . MRSA (methicillin resistant staph aureus) culture positive 04/01/2016   LEFT FOOT  . Myocardial infarction ([place])   . Neuropathy   . Peripheral vascular disease [facility])     HOSPITAL COURSE:  85 year old male with a history of type 2 diabetes, CAD, BPH who presented to the emergency room due to fevers and altered mental status.  1.  Acute metabolic encephalopathy in the setting of sepsis due to urinary tract infection and P community-acquired pneumonia Presented with fever and leukocytosis.  Sepsis has resolved.   2.  Acute kidney injury on chronic kidney disease stage III with baseline creatinine 1.6-1.8: Ultrasound was concerning for right sided hydronephrosis attentionally secondary to right ureteral obstruction Urology consultation was placed.  CT scan showed 9 mm right proximal ureteral stent.  Patient underwent emergent right ureteral stent placement.  He will follow-up with urology in 2 weeks.  His urine culture was less than 10,000 bacteria. He will be discharged on oral Keflex.    3.  Community-acquired  pneumonia: Chest x-ray concerning for right upper lobe airspace opacity and CT is recommended to evaluate for possible underlying neoplasm. Due to elevated creatinine this cannot be performed at this time. This may be ordered as an outpatient by his PCP if clinically indicated.  I recommend a repeat chest x-ray in 3 to 4 weeks. Patient will be discharged on oral azithromycin.    4.  Urinary tract infection: Continue Keflex as indicated above.5.  Hyponatremia: This is improved with IV fluids  6.  Essential hypertension: Continue Norvasc  7.  BPH: Continue Proscar and Flomax  Outpatient follow-up with urology in 2 weeks  8.  Diabetes: Continue oral diabetic medications with ADA diet  DISCHARGE CONDITIONS AND DIET:   Stable for discharge on diabetic heart healthy diet  CONSULTS OBTAINED:  Treatment Team:  Billey Co, MD  DRUG ALLERGIES:   Allergies  Allergen Reactions  . Sulfa Antibiotics Other (See Comments)    Reaction: Severe Shaking, Weakness    DISCHARGE MEDICATIONS:   Allergies as of 07/05/2018      Reactions   Sulfa Antibiotics Other (See Comments)   Reaction: Severe Shaking, Weakness      Medication List    TAKE these medications   amLODipine 5 MG tablet Commonly known as:  NORVASC Take 5 mg by mouth 2 (two) times daily.   azithromycin 250 MG tablet Commonly known as:  Zithromax Take 1 tablet (250 mg total) by mouth daily for 3 days.   cephALEXin 250 MG capsule Commonly  known as:  KEFLEX Take 1 capsule (250 mg total) by mouth every 12 (twelve) hours for 13 days.   clopidogrel 75 MG tablet Commonly known as:  PLAVIX TAKE 1 TABLET BY MOUTH EVERY DAY   finasteride 5 MG tablet Commonly known as:  PROSCAR Take 5 mg by mouth at bedtime.   glipiZIDE 5 MG tablet Commonly known as:  GLUCOTROL Take 5 mg by mouth daily.   ibuprofen 800 MG tablet Commonly known as:  ADVIL,MOTRIN Take 800 mg by mouth 3 (three) times daily as needed for pain.    latanoprost 0.005 % ophthalmic solution Commonly known as:  XALATAN Place 1 drop into both eyes at bedtime.   levETIRAcetam 500 MG tablet Commonly known as:  KEPPRA Take 1 tablet (500 mg total) by mouth 2 (two) times daily.   lovastatin 40 MG tablet Commonly known as:  MEVACOR Take 40 mg by mouth daily.   magnesium oxide 400 MG tablet Commonly known as:  MAG-OX Take 400 mg by mouth daily.   metFORMIN 1000 MG tablet Commonly known as:  GLUCOPHAGE Take 1,000 mg by mouth 2 (two) times daily with a meal.   tamsulosin 0.4 MG Caps capsule Commonly known as:  FLOMAX Take 1 capsule (0.4 mg total) by mouth daily.            Durable Medical Equipment  (From admission, onward)         Start     Ordered   07/05/18 0921  For home use only DME Gilford Rile  [facility])  Once    Question:  Patient needs a walker to treat with the following condition  Answer:  Weakness   07/05/18 0921            Today   CHIEF COMPLAINT:   Patient pleasantly demented    VITAL SIGNS:  Blood pressure 138/70, pulse 93, temperature 98.6 F (37 C), resp. rate 16, height 6' (1.829 m), weight 80.7 kg, SpO2 95 %.   REVIEW OF SYSTEMS:  Review of Systems  Unable to perform ROS: Dementia     PHYSICAL EXAMINATION:  GENERAL:  85 y.o.-year-old patient lying in the bed with no acute distress.  NECK:  Supple, no jugular venous distention. No thyroid enlargement, no tenderness.  LUNGS: Normal breath sounds bilaterally, no wheezing, rales,rhonchi  No use of accessory muscles of respiration.  CARDIOVASCULAR: S1, S2 normal. No murmurs, rubs, or gallops.  ABDOMEN: Soft, non-tender, non-distended. Bowel sounds present. No organomegaly or mass.  EXTREMITIES: No pedal edema, cyanosis, or clubbing.  PSYCHIATRIC: The patient is alert and oriented x name.  SKIN: No obvious rash, lesion, or ulcer.   DATA REVIEW:   CBC Recent Labs  Lab 07/04/18 0334  WBC 8.4  HGB 10.7*  HCT 31.3*  PLT 167     Chemistries  Recent Labs  Lab 07/03/18 1731  07/05/18 0311  NA 131*   < > 135  K 4.5   < > 3.6  CL 101   < > 111  CO2 16*   < > 15*  GLUCOSE 220*   < > 116*  BUN 46*   < > 37*  CREATININE 3.16*   < > 1.94*  CALCIUM 9.0   < > 8.3*  AST 19  --   --   ALT 12  --   --   ALKPHOS 61  --   --   BILITOT 0.8  --   --    < > = values in this  interval not displayed.    Cardiac Enzymes No results for input(s): TROPONINI in the last 168 hours.  Microbiology Results  @[email or web address] @  RADIOLOGY:  US Renal  Result Date: 07/04/2018 CLINICAL DATA:  85 year old male with a history of sepsis. EXAM: RENAL / URINARY TRACT ULTRASOUND COMPLETE COMPARISON:  CT 05/25/2016 FINDINGS: Right Kidney: Length: 11.5 cm x 6.3 cm x 6.0 cm, 228 cc. Echogenicity of the kidney similar to that of the adjacent liver parenchyma. Pelvicaliectasis. Flow confirmed in the hilum of the right kidney. There are 2 cystic structures with through transmission and anechoic features compatible with Bosniak 1 cysts, larger measuring 3.7 cm. This is unchanged from the prior CT. Left Kidney: Length: 10.5 cm x 5.8 cm x 5.2 cm, 166 cc. Echogenicity similar to the contralateral side. No evidence of hydronephrosis. Flow confirmed in the hilum of the right kidney. Bladder: Bladder partially distended. Only the left ureteral jet identified. Prominent median lobe of the prostate. IMPRESSION: Right-sided hydronephrosis with absent right ureteral jet at the bladder, potentially secondary to right ureteral obstruction. Further evaluation with CT may be useful. Electronically Signed   By: Corrie Mckusick D.O.   On: 07/04/2018 08:53   Dg Chest Portable 1 View  Result Date: 07/03/2018 CLINICAL DATA:  Sepsis. EXAM: PORTABLE CHEST 1 VIEW COMPARISON:  Radiograph September 06, 2016.  CT scan of May 26, 2016. FINDINGS: Stable cardiomegaly. Status post coronary bypass graft. Atherosclerosis of thoracic aorta is noted. No pneumothorax or pleural effusion is  noted. Left lung is clear. Right upper lobe airspace opacity noted on prior exam is increased in size compared to prior exam. Bony thorax is unremarkable. IMPRESSION: Right upper lobe airspace opacity is noted which is increased in size compared to prior exam; CT scan of the chest is recommended to evaluate for possible underlying neoplasm. Aortic Atherosclerosis (ICD10-I70.0). Electronically Signed   By: Marijo Conception, M.D.   On: 07/03/2018 18:47   Ct Renal Stone Study  Result Date: 07/04/2018 CLINICAL DATA:  85 year old male with history of acute on chronic kidney injury. Possible right-sided hydronephrosis on ultrasound. EXAM: CT ABDOMEN AND PELVIS WITHOUT CONTRAST TECHNIQUE: Multidetector CT imaging of the abdomen and pelvis was performed following the standard protocol without IV contrast. COMPARISON:  CT the abdomen and pelvis 05/25/2016. FINDINGS: Lower chest: Small right pleural effusion lying dependently. Some associated passive subsegmental atelectasis in the right lower lobe. Atherosclerotic calcifications in the descending thoracic aorta as well as the left anterior descending, left circumflex and right coronary arteries. Calcifications of the aortic valve and mitral annulus. Median sternotomy wires. Hepatobiliary: No definite cystic or solid hepatic lesions are confidently identified on today's noncontrast CT examination. Unenhanced appearance of the gallbladder is normal. Pancreas: No definite pancreatic mass or peripancreatic fluid or inflammatory changes noted on today's noncontrast CT examination. Spleen: Unremarkable. Adrenals/Urinary Tract: Nonobstructive calculi are noted within the right renal collecting system measuring up to 3 mm in the interpolar region. In addition, in the proximal third of the right ureter there is a 9 mm calculus (axial image 50 of series 2) which is associated with mild proximal hydroureteronephrosis. At or immediately beyond the left ureterovesicular junction there  is also a tiny 3 mm calculus. This is not associated with hydroureteronephrosis at this time. Unenhanced appearance of the left kidney is normal. Multiple low-attenuation lesions in the right kidney, incompletely characterized on today's noncontrast CT examination, but statistically likely to represent cysts. Unenhanced appearance of the urinary bladder is normal. Bilateral adrenal glands are normal  in appearance. Stomach/Bowel: Unenhanced appearance of the stomach is normal. No pathologic dilatation of small bowel or colon. Numerous colonic diverticulae are noted, without surrounding inflammatory changes to suggest an acute diverticulitis at this time. Normal appendix. Vascular/Lymphatic: Aortic atherosclerosis. No lymphadenopathy noted in the abdomen or pelvis. Reproductive: Prostate gland and seminal vesicles are unremarkable in appearance. Other: No significant volume of ascites.  No pneumoperitoneum. Musculoskeletal: Median sternotomy wires. There are no aggressive appearing lytic or blastic lesions noted in the visualized portions of the skeleton. IMPRESSION: 1. Multiple nonobstructive calculi are noted within the collecting systems of both kidneys. In addition, in the proximal third of the right ureter there is a 9 mm calculus which is associated with mild proximal right hydroureteronephrosis. There is also a 3 mm calculus at or immediately adjacent to the left ureterovesicular junction. At this time, this is not associated with left-sided hydroureteronephrosis. 2. Small right pleural effusion lying dependently. 3. Colonic diverticulosis without evidence of acute diverticulitis at this time. 4. Aortic atherosclerosis, in addition to at least 3 vessel coronary artery disease. 5. There are calcifications of the aortic valve. Echocardiographic correlation for evaluation of potential valvular dysfunction may be warranted if clinically indicated. Electronically Signed   By: Vinnie Langton M.D.   On: 07/04/2018  11:42      Allergies as of 07/05/2018      Reactions   Sulfa Antibiotics Other (See Comments)   Reaction: Severe Shaking, Weakness      Medication List    TAKE these medications   amLODipine 5 MG tablet Commonly known as:  NORVASC Take 5 mg by mouth 2 (two) times daily.   azithromycin 250 MG tablet Commonly known as:  Zithromax Take 1 tablet (250 mg total) by mouth daily for 3 days.   cephALEXin 250 MG capsule Commonly known as:  KEFLEX Take 1 capsule (250 mg total) by mouth every 12 (twelve) hours for 13 days.   clopidogrel 75 MG tablet Commonly known as:  PLAVIX TAKE 1 TABLET BY MOUTH EVERY DAY   finasteride 5 MG tablet Commonly known as:  PROSCAR Take 5 mg by mouth at bedtime.   glipiZIDE 5 MG tablet Commonly known as:  GLUCOTROL Take 5 mg by mouth daily.   ibuprofen 800 MG tablet Commonly known as:  ADVIL,MOTRIN Take 800 mg by mouth 3 (three) times daily as needed for pain.   latanoprost 0.005 % ophthalmic solution Commonly known as:  XALATAN Place 1 drop into both eyes at bedtime.   levETIRAcetam 500 MG tablet Commonly known as:  KEPPRA Take 1 tablet (500 mg total) by mouth 2 (two) times daily.   lovastatin 40 MG tablet Commonly known as:  MEVACOR Take 40 mg by mouth daily.   magnesium oxide 400 MG tablet Commonly known as:  MAG-OX Take 400 mg by mouth daily.   metFORMIN 1000 MG tablet Commonly known as:  GLUCOPHAGE Take 1,000 mg by mouth 2 (two) times daily with a meal.   tamsulosin 0.4 MG Caps capsule Commonly known as:  FLOMAX Take 1 capsule (0.4 mg total) by mouth daily.            Durable Medical Equipment  (From admission, onward)         Start     Ordered   07/05/18 0921  For home use only DME Gilford Rile  [facility])  Once    Question:  Patient needs a walker to treat with the following condition  Answer:  Weakness   07/05/18 0921  Management plans discussed with the patient's family and they are in  agreement. Stable for discharge   Patient should follow up with pcp  CODE STATUS:     Code Status Orders  (From admission, onward)         Start     Ordered   07/03/18 2129  Full code  Continuous     07/03/18 2128        Code Status History    Date Active Date Inactive Code Status Order ID Comments User Context   09/07/2016 0454 09/08/2016 1942 Full Code [id number]  Harrie Foreman, MD Inpatient   07/18/2016 1257 07/18/2016 1800 Full Code [id number]  Algernon Huxley, MD Inpatient   07/06/2016 1326 07/06/2016 1849 Full Code [id number]  Algernon Huxley, MD Inpatient   05/26/2016 0249 05/30/2016 1954 Full Code [id number]  Lance Coon, MD Inpatient   02/03/2016 1550 02/03/2016 2042 Full Code [id number]  Algernon Huxley, MD Inpatient   11/05/2014 0059 11/06/2014 1656 Full Code [id number]  Hower, Aaron Mose, MD ED      TOTAL TIME TAKING CARE OF THIS PATIENT: 38 minutes.    Note: This dictation was prepared with Dragon dictation along with smaller phrase technology. Any transcriptional errors that result from this process are unintentional.  Bettey Costa M.D on 07/05/2018 at 10:12 AM  Between 7am to 6pm - Pager - [phone] After 6pm go to [email or web address] - password [email or web address]  Office  [phone]  CC: Primary care physician; [name], MD

## 2018-07-06 DIAGNOSIS — I359 Nonrheumatic aortic valve disorder, unspecified: Secondary | ICD-10-CM | POA: Diagnosis not present

## 2018-07-06 DIAGNOSIS — E1122 Type 2 diabetes mellitus with diabetic chronic kidney disease: Secondary | ICD-10-CM | POA: Diagnosis not present

## 2018-07-06 DIAGNOSIS — I129 Hypertensive chronic kidney disease with stage 1 through stage 4 chronic kidney disease, or unspecified chronic kidney disease: Secondary | ICD-10-CM | POA: Diagnosis not present

## 2018-07-06 DIAGNOSIS — I2510 Atherosclerotic heart disease of native coronary artery without angina pectoris: Secondary | ICD-10-CM | POA: Diagnosis not present

## 2018-07-06 DIAGNOSIS — L89151 Pressure ulcer of sacral region, stage 1: Secondary | ICD-10-CM | POA: Diagnosis not present

## 2018-07-06 DIAGNOSIS — N182 Chronic kidney disease, stage 2 (mild): Secondary | ICD-10-CM | POA: Diagnosis not present

## 2018-07-06 DIAGNOSIS — D539 Nutritional anemia, unspecified: Secondary | ICD-10-CM | POA: Diagnosis not present

## 2018-07-06 DIAGNOSIS — N400 Enlarged prostate without lower urinary tract symptoms: Secondary | ICD-10-CM | POA: Diagnosis not present

## 2018-07-06 DIAGNOSIS — M6281 Muscle weakness (generalized): Secondary | ICD-10-CM | POA: Diagnosis not present

## 2018-07-08 LAB — CULTURE, BLOOD (ROUTINE X 2)
Culture: NO GROWTH
Culture: NO GROWTH
Special Requests: ADEQUATE
Special Requests: ADEQUATE

## 2018-07-10 ENCOUNTER — Telehealth: Payer: Self-pay | Admitting: Radiology

## 2018-07-10 DIAGNOSIS — N400 Enlarged prostate without lower urinary tract symptoms: Secondary | ICD-10-CM | POA: Diagnosis not present

## 2018-07-10 DIAGNOSIS — M6281 Muscle weakness (generalized): Secondary | ICD-10-CM | POA: Diagnosis not present

## 2018-07-10 DIAGNOSIS — I2510 Atherosclerotic heart disease of native coronary artery without angina pectoris: Secondary | ICD-10-CM | POA: Diagnosis not present

## 2018-07-10 DIAGNOSIS — L89151 Pressure ulcer of sacral region, stage 1: Secondary | ICD-10-CM | POA: Diagnosis not present

## 2018-07-10 DIAGNOSIS — E1122 Type 2 diabetes mellitus with diabetic chronic kidney disease: Secondary | ICD-10-CM | POA: Diagnosis not present

## 2018-07-10 DIAGNOSIS — N182 Chronic kidney disease, stage 2 (mild): Secondary | ICD-10-CM | POA: Diagnosis not present

## 2018-07-10 DIAGNOSIS — D539 Nutritional anemia, unspecified: Secondary | ICD-10-CM | POA: Diagnosis not present

## 2018-07-10 DIAGNOSIS — I359 Nonrheumatic aortic valve disorder, unspecified: Secondary | ICD-10-CM | POA: Diagnosis not present

## 2018-07-10 DIAGNOSIS — I129 Hypertensive chronic kidney disease with stage 1 through stage 4 chronic kidney disease, or unspecified chronic kidney disease: Secondary | ICD-10-CM | POA: Diagnosis not present

## 2018-07-10 NOTE — Telephone Encounter (Signed)
Spoke with daughter regarding surgery recommended by Dr Diamantina Providence for treatment of kidney stone. She would like to speak with Dr Diamantina Providence regarding concerns prior to scheduling surgery. She may be reached at [phone] (home) or [phone] (cell).

## 2018-07-11 ENCOUNTER — Telehealth: Payer: Self-pay | Admitting: Urology

## 2018-07-11 MED ORDER — NITROFURANTOIN MONOHYD MACRO 100 MG PO CAPS: 100.0000 mg | ORAL_CAPSULE | Freq: Every day | ORAL | 1 refills | Status: DC

## 2018-07-11 NOTE — Telephone Encounter (Signed)
UROLOGY TELEPHONE NOTE  Briefly, Matthew Bruce is a co-morbid 85 year old male who presented 07/04/2018 with altered mental status, fever to 103, sepsis from urinary source, and right 9 mm proximal ureteral stone.  He underwent emergent right ureteral stent placement that time.  He was discharged home on Keflex.  I had a long conversation with his daughter over the phone today about options moving forward.  I did recommend definitive management of his right-sided ureteral stone with ureteroscopy within the next 2 to 3 weeks.  In the setting of the COVID-19 pandemic, she would like to delay treatment, she is not comfortable bringing him to the hospital right now.  This is certainly understandable.  She also reports he has had ongoing weakness as he recovers from his hospitalization.  He is not having any pain or fevers, and his mental status has improved.  We discussed the risk of infection while the ureteral stent is in.  We also discussed stents typically cannot stay for more than about 3 months with the risk of encrustation and infection.  We agreed to tentatively schedule outpatient right ureteroscopy, laser lithotripsy, and stent exchange in mid May.  We discussed the risks and benefits at length.  I prescribed nitrofurantoin 100 mg daily for UTI prophylaxis while the stent is in place.  She knows to call if he develops fevers or altered mental status.  Nickolas Madrid, MD 07/11/2018

## 2018-07-11 NOTE — Telephone Encounter (Signed)
Please re-schedule his surgery on a Thursday morning in mid-May. I discussed with his daughter, see note in chart. Thanks  Nickolas Madrid, MD 07/11/2018

## 2018-07-12 DIAGNOSIS — D539 Nutritional anemia, unspecified: Secondary | ICD-10-CM | POA: Diagnosis not present

## 2018-07-12 DIAGNOSIS — I2510 Atherosclerotic heart disease of native coronary artery without angina pectoris: Secondary | ICD-10-CM | POA: Diagnosis not present

## 2018-07-12 DIAGNOSIS — N182 Chronic kidney disease, stage 2 (mild): Secondary | ICD-10-CM | POA: Diagnosis not present

## 2018-07-12 DIAGNOSIS — E1122 Type 2 diabetes mellitus with diabetic chronic kidney disease: Secondary | ICD-10-CM | POA: Diagnosis not present

## 2018-07-12 DIAGNOSIS — N400 Enlarged prostate without lower urinary tract symptoms: Secondary | ICD-10-CM | POA: Diagnosis not present

## 2018-07-12 DIAGNOSIS — L89151 Pressure ulcer of sacral region, stage 1: Secondary | ICD-10-CM | POA: Diagnosis not present

## 2018-07-12 DIAGNOSIS — I129 Hypertensive chronic kidney disease with stage 1 through stage 4 chronic kidney disease, or unspecified chronic kidney disease: Secondary | ICD-10-CM | POA: Diagnosis not present

## 2018-07-12 DIAGNOSIS — M6281 Muscle weakness (generalized): Secondary | ICD-10-CM | POA: Diagnosis not present

## 2018-07-12 DIAGNOSIS — I359 Nonrheumatic aortic valve disorder, unspecified: Secondary | ICD-10-CM | POA: Diagnosis not present

## 2018-07-16 ENCOUNTER — Other Ambulatory Visit: Payer: Self-pay | Admitting: Radiology

## 2018-07-16 DIAGNOSIS — N201 Calculus of ureter: Secondary | ICD-10-CM

## 2018-07-16 NOTE — Telephone Encounter (Signed)
Discussed the [facility] Information form below over the phone with patient's daughter, [name].    [place], [address], [place] [address] Telephone: [phone] Fax: [phone]   Thank you for choosing [facility] for your upcoming surgery!  We are always here to assist in your urological needs.  Please read the following information with specific details for your upcoming appointments related to your surgery. Please contact Cliff Damiani at [phone] Option 3 with any questions.  The Name of Your Surgery: Right ureteroscopy, laser lithotripsy, stone removal, ureteral stent placement Your Surgery Date: 08/30/2018 Your Surgeon: Nickolas Madrid  Please call Same Day Surgery at [phone] between the hours of 1pm-3pm one day prior to your surgery. They will inform you of the time to arrive at Same Day Surgery which is located on the second floor of the [facility].   Please refer to the attached letter regarding instructions for Pre-Admission Testing. You will receive a call from the [place] office regarding your appointment with them.  The Pre-Admission Testing office is located at [place], on the first floor of the [place] [facility] in [place] (office is to the right as you enter through the [employer] of the [facility]). Please have all medications you are currently taking and your insurance card available.   Patient was advised to have nothing to eat or drink after midnight the night prior to surgery except that he may have only water until 2 hours before surgery with nothing to drink within 2 hours of surgery.  The patient currently takes Plavix & was informed to hold medication for 7 days prior to surgery beginning on 08/23/2018 per clearance from Dr Edwina Barth. Daughter's questions were answered and she  expressed understanding of these instructions.

## 2018-07-17 DIAGNOSIS — N3000 Acute cystitis without hematuria: Secondary | ICD-10-CM | POA: Diagnosis not present

## 2018-07-17 DIAGNOSIS — L89151 Pressure ulcer of sacral region, stage 1: Secondary | ICD-10-CM | POA: Diagnosis not present

## 2018-07-17 DIAGNOSIS — I359 Nonrheumatic aortic valve disorder, unspecified: Secondary | ICD-10-CM | POA: Diagnosis not present

## 2018-07-17 DIAGNOSIS — D539 Nutritional anemia, unspecified: Secondary | ICD-10-CM | POA: Diagnosis not present

## 2018-07-17 DIAGNOSIS — I129 Hypertensive chronic kidney disease with stage 1 through stage 4 chronic kidney disease, or unspecified chronic kidney disease: Secondary | ICD-10-CM | POA: Diagnosis not present

## 2018-07-17 DIAGNOSIS — Z09 Encounter for follow-up examination after completed treatment for conditions other than malignant neoplasm: Secondary | ICD-10-CM | POA: Diagnosis not present

## 2018-07-17 DIAGNOSIS — M6281 Muscle weakness (generalized): Secondary | ICD-10-CM | POA: Diagnosis not present

## 2018-07-17 DIAGNOSIS — E1122 Type 2 diabetes mellitus with diabetic chronic kidney disease: Secondary | ICD-10-CM | POA: Diagnosis not present

## 2018-07-17 DIAGNOSIS — N400 Enlarged prostate without lower urinary tract symptoms: Secondary | ICD-10-CM | POA: Diagnosis not present

## 2018-07-17 DIAGNOSIS — N200 Calculus of kidney: Secondary | ICD-10-CM | POA: Diagnosis not present

## 2018-07-17 DIAGNOSIS — I2510 Atherosclerotic heart disease of native coronary artery without angina pectoris: Secondary | ICD-10-CM | POA: Diagnosis not present

## 2018-07-17 DIAGNOSIS — N182 Chronic kidney disease, stage 2 (mild): Secondary | ICD-10-CM | POA: Diagnosis not present

## 2018-07-19 DIAGNOSIS — N182 Chronic kidney disease, stage 2 (mild): Secondary | ICD-10-CM | POA: Diagnosis not present

## 2018-07-19 DIAGNOSIS — I359 Nonrheumatic aortic valve disorder, unspecified: Secondary | ICD-10-CM | POA: Diagnosis not present

## 2018-07-19 DIAGNOSIS — I2510 Atherosclerotic heart disease of native coronary artery without angina pectoris: Secondary | ICD-10-CM | POA: Diagnosis not present

## 2018-07-19 DIAGNOSIS — I129 Hypertensive chronic kidney disease with stage 1 through stage 4 chronic kidney disease, or unspecified chronic kidney disease: Secondary | ICD-10-CM | POA: Diagnosis not present

## 2018-07-19 DIAGNOSIS — L89151 Pressure ulcer of sacral region, stage 1: Secondary | ICD-10-CM | POA: Diagnosis not present

## 2018-07-19 DIAGNOSIS — M6281 Muscle weakness (generalized): Secondary | ICD-10-CM | POA: Diagnosis not present

## 2018-07-19 DIAGNOSIS — E1122 Type 2 diabetes mellitus with diabetic chronic kidney disease: Secondary | ICD-10-CM | POA: Diagnosis not present

## 2018-07-19 DIAGNOSIS — N400 Enlarged prostate without lower urinary tract symptoms: Secondary | ICD-10-CM | POA: Diagnosis not present

## 2018-07-19 DIAGNOSIS — D539 Nutritional anemia, unspecified: Secondary | ICD-10-CM | POA: Diagnosis not present

## 2018-07-24 ENCOUNTER — Telehealth: Payer: Self-pay | Admitting: Urology

## 2018-07-24 DIAGNOSIS — I359 Nonrheumatic aortic valve disorder, unspecified: Secondary | ICD-10-CM | POA: Diagnosis not present

## 2018-07-24 DIAGNOSIS — D539 Nutritional anemia, unspecified: Secondary | ICD-10-CM | POA: Diagnosis not present

## 2018-07-24 DIAGNOSIS — N182 Chronic kidney disease, stage 2 (mild): Secondary | ICD-10-CM | POA: Diagnosis not present

## 2018-07-24 DIAGNOSIS — E1122 Type 2 diabetes mellitus with diabetic chronic kidney disease: Secondary | ICD-10-CM | POA: Diagnosis not present

## 2018-07-24 DIAGNOSIS — I2510 Atherosclerotic heart disease of native coronary artery without angina pectoris: Secondary | ICD-10-CM | POA: Diagnosis not present

## 2018-07-24 DIAGNOSIS — M6281 Muscle weakness (generalized): Secondary | ICD-10-CM | POA: Diagnosis not present

## 2018-07-24 DIAGNOSIS — N400 Enlarged prostate without lower urinary tract symptoms: Secondary | ICD-10-CM | POA: Diagnosis not present

## 2018-07-24 DIAGNOSIS — L89151 Pressure ulcer of sacral region, stage 1: Secondary | ICD-10-CM | POA: Diagnosis not present

## 2018-07-24 DIAGNOSIS — I129 Hypertensive chronic kidney disease with stage 1 through stage 4 chronic kidney disease, or unspecified chronic kidney disease: Secondary | ICD-10-CM | POA: Diagnosis not present

## 2018-07-24 NOTE — Telephone Encounter (Signed)
Received a call from the home health nurse and since the patient has been taking the [place] he has had diarrhea. Spoke with Dr. Diamantina Providence ok to stop.  Sharyn Lull

## 2018-07-27 DIAGNOSIS — L89151 Pressure ulcer of sacral region, stage 1: Secondary | ICD-10-CM | POA: Diagnosis not present

## 2018-07-27 DIAGNOSIS — E1122 Type 2 diabetes mellitus with diabetic chronic kidney disease: Secondary | ICD-10-CM | POA: Diagnosis not present

## 2018-07-27 DIAGNOSIS — I359 Nonrheumatic aortic valve disorder, unspecified: Secondary | ICD-10-CM | POA: Diagnosis not present

## 2018-07-27 DIAGNOSIS — I129 Hypertensive chronic kidney disease with stage 1 through stage 4 chronic kidney disease, or unspecified chronic kidney disease: Secondary | ICD-10-CM | POA: Diagnosis not present

## 2018-07-27 DIAGNOSIS — M6281 Muscle weakness (generalized): Secondary | ICD-10-CM | POA: Diagnosis not present

## 2018-07-27 DIAGNOSIS — N400 Enlarged prostate without lower urinary tract symptoms: Secondary | ICD-10-CM | POA: Diagnosis not present

## 2018-07-27 DIAGNOSIS — I2510 Atherosclerotic heart disease of native coronary artery without angina pectoris: Secondary | ICD-10-CM | POA: Diagnosis not present

## 2018-07-27 DIAGNOSIS — D539 Nutritional anemia, unspecified: Secondary | ICD-10-CM | POA: Diagnosis not present

## 2018-07-27 DIAGNOSIS — N182 Chronic kidney disease, stage 2 (mild): Secondary | ICD-10-CM | POA: Diagnosis not present

## 2018-07-31 DIAGNOSIS — D539 Nutritional anemia, unspecified: Secondary | ICD-10-CM | POA: Diagnosis not present

## 2018-07-31 DIAGNOSIS — I2510 Atherosclerotic heart disease of native coronary artery without angina pectoris: Secondary | ICD-10-CM | POA: Diagnosis not present

## 2018-07-31 DIAGNOSIS — E1122 Type 2 diabetes mellitus with diabetic chronic kidney disease: Secondary | ICD-10-CM | POA: Diagnosis not present

## 2018-07-31 DIAGNOSIS — N400 Enlarged prostate without lower urinary tract symptoms: Secondary | ICD-10-CM | POA: Diagnosis not present

## 2018-07-31 DIAGNOSIS — M6281 Muscle weakness (generalized): Secondary | ICD-10-CM | POA: Diagnosis not present

## 2018-07-31 DIAGNOSIS — L89151 Pressure ulcer of sacral region, stage 1: Secondary | ICD-10-CM | POA: Diagnosis not present

## 2018-07-31 DIAGNOSIS — I359 Nonrheumatic aortic valve disorder, unspecified: Secondary | ICD-10-CM | POA: Diagnosis not present

## 2018-07-31 DIAGNOSIS — N182 Chronic kidney disease, stage 2 (mild): Secondary | ICD-10-CM | POA: Diagnosis not present

## 2018-07-31 DIAGNOSIS — I129 Hypertensive chronic kidney disease with stage 1 through stage 4 chronic kidney disease, or unspecified chronic kidney disease: Secondary | ICD-10-CM | POA: Diagnosis not present

## 2018-08-03 DIAGNOSIS — L89151 Pressure ulcer of sacral region, stage 1: Secondary | ICD-10-CM | POA: Diagnosis not present

## 2018-08-03 DIAGNOSIS — D539 Nutritional anemia, unspecified: Secondary | ICD-10-CM | POA: Diagnosis not present

## 2018-08-03 DIAGNOSIS — E1122 Type 2 diabetes mellitus with diabetic chronic kidney disease: Secondary | ICD-10-CM | POA: Diagnosis not present

## 2018-08-03 DIAGNOSIS — M6281 Muscle weakness (generalized): Secondary | ICD-10-CM | POA: Diagnosis not present

## 2018-08-03 DIAGNOSIS — N182 Chronic kidney disease, stage 2 (mild): Secondary | ICD-10-CM | POA: Diagnosis not present

## 2018-08-03 DIAGNOSIS — I2510 Atherosclerotic heart disease of native coronary artery without angina pectoris: Secondary | ICD-10-CM | POA: Diagnosis not present

## 2018-08-03 DIAGNOSIS — N400 Enlarged prostate without lower urinary tract symptoms: Secondary | ICD-10-CM | POA: Diagnosis not present

## 2018-08-03 DIAGNOSIS — I129 Hypertensive chronic kidney disease with stage 1 through stage 4 chronic kidney disease, or unspecified chronic kidney disease: Secondary | ICD-10-CM | POA: Diagnosis not present

## 2018-08-03 DIAGNOSIS — I359 Nonrheumatic aortic valve disorder, unspecified: Secondary | ICD-10-CM | POA: Diagnosis not present

## 2018-08-07 DIAGNOSIS — N182 Chronic kidney disease, stage 2 (mild): Secondary | ICD-10-CM | POA: Diagnosis not present

## 2018-08-07 DIAGNOSIS — I2510 Atherosclerotic heart disease of native coronary artery without angina pectoris: Secondary | ICD-10-CM | POA: Diagnosis not present

## 2018-08-07 DIAGNOSIS — I129 Hypertensive chronic kidney disease with stage 1 through stage 4 chronic kidney disease, or unspecified chronic kidney disease: Secondary | ICD-10-CM | POA: Diagnosis not present

## 2018-08-07 DIAGNOSIS — L89151 Pressure ulcer of sacral region, stage 1: Secondary | ICD-10-CM | POA: Diagnosis not present

## 2018-08-07 DIAGNOSIS — D539 Nutritional anemia, unspecified: Secondary | ICD-10-CM | POA: Diagnosis not present

## 2018-08-07 DIAGNOSIS — I359 Nonrheumatic aortic valve disorder, unspecified: Secondary | ICD-10-CM | POA: Diagnosis not present

## 2018-08-07 DIAGNOSIS — M6281 Muscle weakness (generalized): Secondary | ICD-10-CM | POA: Diagnosis not present

## 2018-08-07 DIAGNOSIS — E1122 Type 2 diabetes mellitus with diabetic chronic kidney disease: Secondary | ICD-10-CM | POA: Diagnosis not present

## 2018-08-07 DIAGNOSIS — N400 Enlarged prostate without lower urinary tract symptoms: Secondary | ICD-10-CM | POA: Diagnosis not present

## 2018-08-14 DIAGNOSIS — N182 Chronic kidney disease, stage 2 (mild): Secondary | ICD-10-CM | POA: Diagnosis not present

## 2018-08-14 DIAGNOSIS — E1122 Type 2 diabetes mellitus with diabetic chronic kidney disease: Secondary | ICD-10-CM | POA: Diagnosis not present

## 2018-08-14 DIAGNOSIS — L89151 Pressure ulcer of sacral region, stage 1: Secondary | ICD-10-CM | POA: Diagnosis not present

## 2018-08-14 DIAGNOSIS — I2510 Atherosclerotic heart disease of native coronary artery without angina pectoris: Secondary | ICD-10-CM | POA: Diagnosis not present

## 2018-08-14 DIAGNOSIS — I359 Nonrheumatic aortic valve disorder, unspecified: Secondary | ICD-10-CM | POA: Diagnosis not present

## 2018-08-14 DIAGNOSIS — M6281 Muscle weakness (generalized): Secondary | ICD-10-CM | POA: Diagnosis not present

## 2018-08-14 DIAGNOSIS — D539 Nutritional anemia, unspecified: Secondary | ICD-10-CM | POA: Diagnosis not present

## 2018-08-14 DIAGNOSIS — I129 Hypertensive chronic kidney disease with stage 1 through stage 4 chronic kidney disease, or unspecified chronic kidney disease: Secondary | ICD-10-CM | POA: Diagnosis not present

## 2018-08-14 DIAGNOSIS — N400 Enlarged prostate without lower urinary tract symptoms: Secondary | ICD-10-CM | POA: Diagnosis not present

## 2018-08-20 ENCOUNTER — Other Ambulatory Visit: Payer: Self-pay | Admitting: Radiology

## 2018-08-21 ENCOUNTER — Inpatient Hospital Stay: Admission: RE | Admit: 2018-08-21 | Payer: Medicare HMO | Source: Ambulatory Visit

## 2018-08-22 DIAGNOSIS — D539 Nutritional anemia, unspecified: Secondary | ICD-10-CM | POA: Diagnosis not present

## 2018-08-22 DIAGNOSIS — L89151 Pressure ulcer of sacral region, stage 1: Secondary | ICD-10-CM | POA: Diagnosis not present

## 2018-08-22 DIAGNOSIS — I2510 Atherosclerotic heart disease of native coronary artery without angina pectoris: Secondary | ICD-10-CM | POA: Diagnosis not present

## 2018-08-22 DIAGNOSIS — E1122 Type 2 diabetes mellitus with diabetic chronic kidney disease: Secondary | ICD-10-CM | POA: Diagnosis not present

## 2018-08-22 DIAGNOSIS — I359 Nonrheumatic aortic valve disorder, unspecified: Secondary | ICD-10-CM | POA: Diagnosis not present

## 2018-08-22 DIAGNOSIS — N400 Enlarged prostate without lower urinary tract symptoms: Secondary | ICD-10-CM | POA: Diagnosis not present

## 2018-08-22 DIAGNOSIS — I129 Hypertensive chronic kidney disease with stage 1 through stage 4 chronic kidney disease, or unspecified chronic kidney disease: Secondary | ICD-10-CM | POA: Diagnosis not present

## 2018-08-22 DIAGNOSIS — N182 Chronic kidney disease, stage 2 (mild): Secondary | ICD-10-CM | POA: Diagnosis not present

## 2018-08-22 DIAGNOSIS — M6281 Muscle weakness (generalized): Secondary | ICD-10-CM | POA: Diagnosis not present

## 2018-08-24 ENCOUNTER — Encounter
Admission: RE | Admit: 2018-08-24 | Discharge: 2018-08-24 | Disposition: A | Discharge status: Home / Self Care | Payer: Medicare HMO | Source: Ambulatory Visit | Attending: Urology | Admitting: Urology

## 2018-08-24 ENCOUNTER — Other Ambulatory Visit: Payer: Self-pay

## 2018-08-24 DIAGNOSIS — Z01812 Encounter for preprocedural laboratory examination: Secondary | ICD-10-CM | POA: Insufficient documentation

## 2018-08-24 DIAGNOSIS — Z1159 Encounter for screening for other viral diseases: Secondary | ICD-10-CM | POA: Diagnosis not present

## 2018-08-24 NOTE — Patient Instructions (Signed)
Your procedure is scheduled on: 08/30/18 Thurs Report to [place]. To find out your arrival time please call [phone] between 1PM - 3PM on 08/29/18 Wed.  Remember: Instructions that are not followed completely may result in serious medical risk, up to and including death, or upon the discretion of your surgeon and anesthesiologist your surgery may need to be rescheduled.     _X__ 1. Do not eat food after midnight the night before your procedure.                 No gum chewing or hard candies. You may drink clear liquids up to 2 hours                 before you are scheduled to arrive for your surgery- DO not drink clear                 liquids within 2 hours of the start of your surgery.                 Clear Liquids include:  water, apple juice without pulp, clear carbohydrate                 drink such as Clearfast or Gatorade, Black Coffee or Tea (Do not add                 anything to coffee or tea).  __X__2.  On the morning of surgery brush your teeth with toothpaste and water, you                 may rinse your mouth with mouthwash if you wish.  Do not swallow any              toothpaste of mouthwash.     _X__ 3.  No Alcohol for 24 hours before or after surgery.   _X__ 4.  Do Not Smoke or use e-cigarettes For 24 Hours Prior to Your Surgery.                 Do not use any chewable tobacco products for at least 6 hours prior to                 surgery.  ____  5.  Bring all medications with you on the day of surgery if instructed.   __X__  6.  Notify your doctor if there is any change in your medical condition      (cold, fever, infections).     Do not wear jewelry, make-up, hairpins, clips or nail polish. Do not wear lotions, powders, or perfumes.  Do not shave 48 hours prior to surgery. Men may shave face and neck. Do not bring valuables to the hospital.    [facility] is not responsible for any belongings or  valuables.  Contacts, dentures/partials or body piercings may not be worn into surgery. Bring a case for your contacts, glasses or hearing aids, a denture cup will be supplied. Leave your suitcase in the car. After surgery it may be brought to your room. For patients admitted to the hospital, discharge time is determined by your treatment team.   Patients discharged the day of surgery will not be allowed to drive home.   Please read over the following fact sheets that you were given:   MRSA Information  __X__ Take these medicines the morning of surgery with A SIP OF WATER:  1. amLODipine (NORVASC)  2. finasteride (PROSCAR)  3. levETIRAcetam (KEPPRA)  4. lovastatin (MEVACOR  5. magnesium oxide   6. tamsulosin (FLOMAX)   ____ Fleet Enema (as directed)   ____ Use CHG Soap/SAGE wipes as directed  ____ Use inhalers on the day of surgery  __X__ Stop metformin/Janumet/Farxiga 2 days prior to surgery    ____ Take 1/2 of usual insulin dose the night before surgery. No insulin the morning          of surgery.   __X__ Stop Blood Thinners Coumadin/Plavix/Xarelto/Pleta/Pradaxa/Eliquis/Effient/Aspirin  on   Or contact your Surgeon, Cardiologist or Medical Doctor regarding  ability to stop your blood thinners  __X__ Stop Anti-inflammatories 7 days before surgery such as Advil, Ibuprofen, Motrin,  BC or Goodies Powder, Naprosyn, Naproxen, Aleve, Aspirin    __X__ Stop all herbal supplements, fish oil or vitamin E until after surgery.    ____ Bring C-Pap to the hospital.

## 2018-08-24 NOTE — Pre-Procedure Instructions (Addendum)
Not able to collect urine specimen in PAT. Patient is incontinent. Collection attempted but patient not able to void at the time.

## 2018-08-25 LAB — NOVEL CORONAVIRUS, NAA (HOSP ORDER, SEND-OUT TO REF LAB; TAT 18-24 HRS): SARS-CoV-2, NAA: NOT DETECTED

## 2018-08-28 ENCOUNTER — Other Ambulatory Visit: Payer: Medicare HMO

## 2018-08-29 ENCOUNTER — Telehealth: Payer: Self-pay

## 2018-08-29 DIAGNOSIS — N201 Calculus of ureter: Secondary | ICD-10-CM

## 2018-08-29 MED ORDER — CIPROFLOXACIN HCL 500 MG PO TABS: 500.0000 mg | ORAL_TABLET | Freq: Two times a day (BID) | ORAL | 0 refills | Status: DC

## 2018-08-29 NOTE — Telephone Encounter (Signed)
-----   Message from Billey Co, MD sent at 08/29/2018 10:20 AM EDT ----- Regarding: abx asap Please start him on Cipro 500mg  BID x 3 pills. He should take one this AM, one tonight, and one early tmrw AM. He is scheduled for surgery with me tmrw at 730am, the abx are to sterilize the urine and reduce risk of infection  Nickolas Madrid, MD 08/29/2018

## 2018-08-29 NOTE — Telephone Encounter (Signed)
Called pt's daughter, her husband states that she cannot come to the phone and she will call back. RX sent in. Daughter needs to pick up and have pt begin immediately.

## 2018-08-29 NOTE — Telephone Encounter (Signed)
Pt's daughter returns call informed her of the information below per Dr.Sninsky. Daughter gave verbal understanding.

## 2018-08-29 NOTE — Telephone Encounter (Signed)
error 

## 2018-08-30 ENCOUNTER — Ambulatory Visit
Admission: RE | Admit: 2018-08-30 | Discharge: 2018-08-30 | Disposition: A | Discharge status: Home / Self Care | Payer: Medicare HMO | Attending: Urology | Admitting: Urology

## 2018-08-30 ENCOUNTER — Ambulatory Visit: Payer: Medicare HMO | Admitting: Anesthesiology

## 2018-08-30 ENCOUNTER — Other Ambulatory Visit: Payer: Self-pay

## 2018-08-30 ENCOUNTER — Encounter: Payer: Self-pay | Admitting: *Deleted

## 2018-08-30 ENCOUNTER — Encounter
Admission: RE | Disposition: A | Discharge status: Home / Self Care | Payer: Self-pay | Source: Home / Self Care | Attending: Urology

## 2018-08-30 DIAGNOSIS — N201 Calculus of ureter: Secondary | ICD-10-CM | POA: Insufficient documentation

## 2018-08-30 DIAGNOSIS — Z7984 Long term (current) use of oral hypoglycemic drugs: Secondary | ICD-10-CM | POA: Diagnosis not present

## 2018-08-30 DIAGNOSIS — Z951 Presence of aortocoronary bypass graft: Secondary | ICD-10-CM | POA: Insufficient documentation

## 2018-08-30 DIAGNOSIS — R32 Unspecified urinary incontinence: Secondary | ICD-10-CM | POA: Insufficient documentation

## 2018-08-30 DIAGNOSIS — Z538 Procedure and treatment not carried out for other reasons: Secondary | ICD-10-CM | POA: Diagnosis not present

## 2018-08-30 DIAGNOSIS — H9190 Unspecified hearing loss, unspecified ear: Secondary | ICD-10-CM | POA: Diagnosis not present

## 2018-08-30 DIAGNOSIS — I252 Old myocardial infarction: Secondary | ICD-10-CM | POA: Diagnosis not present

## 2018-08-30 DIAGNOSIS — Z87891 Personal history of nicotine dependence: Secondary | ICD-10-CM | POA: Diagnosis not present

## 2018-08-30 DIAGNOSIS — I10 Essential (primary) hypertension: Secondary | ICD-10-CM | POA: Diagnosis not present

## 2018-08-30 DIAGNOSIS — Z419 Encounter for procedure for purposes other than remedying health state, unspecified: Secondary | ICD-10-CM

## 2018-08-30 DIAGNOSIS — E1140 Type 2 diabetes mellitus with diabetic neuropathy, unspecified: Secondary | ICD-10-CM | POA: Diagnosis not present

## 2018-08-30 DIAGNOSIS — Z952 Presence of prosthetic heart valve: Secondary | ICD-10-CM | POA: Diagnosis not present

## 2018-08-30 DIAGNOSIS — I2510 Atherosclerotic heart disease of native coronary artery without angina pectoris: Secondary | ICD-10-CM | POA: Insufficient documentation

## 2018-08-30 DIAGNOSIS — E1151 Type 2 diabetes mellitus with diabetic peripheral angiopathy without gangrene: Secondary | ICD-10-CM | POA: Insufficient documentation

## 2018-08-30 LAB — URINALYSIS, COMPLETE (UACMP) WITH MICROSCOPIC
Bacteria, UA: NONE SEEN
Bilirubin Urine: NEGATIVE
Glucose, UA: 50 mg/dL — AB
Ketones, ur: NEGATIVE mg/dL
Nitrite: NEGATIVE
Protein, ur: 100 mg/dL — AB
Specific Gravity, Urine: 1.012 (ref 1.005–1.030)
Squamous Epithelial / HPF: NONE SEEN (ref 0–5)
WBC, UA: >50 WBC/hpf — ABNORMAL HIGH (ref 0–5)
pH: 6 (ref 5.0–8.0)

## 2018-08-30 LAB — GLUCOSE, CAPILLARY: Glucose-Capillary: 218 mg/dL — ABNORMAL HIGH (ref 70–99)

## 2018-08-30 SURGERY — CYSTOSCOPY/URETEROSCOPY/HOLMIUM LASER/STENT PLACEMENT
Anesthesia: General | Laterality: Right

## 2018-08-30 MED ORDER — FAMOTIDINE 20 MG PO TABS
ORAL_TABLET | ORAL | Status: AC
  Filled 2018-08-30: qty 1

## 2018-08-30 MED ORDER — CEFAZOLIN SODIUM-DEXTROSE 2-4 GM/100ML-% IV SOLN
INTRAVENOUS | Status: AC
  Filled 2018-08-30: qty 100

## 2018-08-30 MED ORDER — FLUCONAZOLE 100 MG PO TABS: 200.0000 mg | ORAL_TABLET | Freq: Every day | ORAL | 0 refills | Status: AC

## 2018-08-30 MED ORDER — CEFAZOLIN SODIUM-DEXTROSE 2-4 GM/100ML-% IV SOLN: 2.0000 g | INTRAVENOUS | Status: DC

## 2018-08-30 MED ORDER — LIDOCAINE HCL (PF) 2 % IJ SOLN
INTRAMUSCULAR | Status: AC
  Filled 2018-08-30: qty 10

## 2018-08-30 MED ORDER — PROPOFOL 10 MG/ML IV BOLUS
INTRAVENOUS | Status: AC
  Filled 2018-08-30: qty 20

## 2018-08-30 MED ORDER — FAMOTIDINE 20 MG PO TABS: 20.0000 mg | ORAL_TABLET | Freq: Once | ORAL | Status: DC

## 2018-08-30 MED ORDER — MIDAZOLAM HCL 2 MG/2ML IJ SOLN
INTRAMUSCULAR | Status: AC
  Filled 2018-08-30: qty 2

## 2018-08-30 MED ORDER — SODIUM CHLORIDE 0.9 % IV SOLN: INTRAVENOUS | Status: DC

## 2018-08-30 MED ORDER — FENTANYL CITRATE (PF) 100 MCG/2ML IJ SOLN
INTRAMUSCULAR | Status: AC
  Filled 2018-08-30: qty 2

## 2018-08-30 SURGICAL SUPPLY — 31 items
BAG DRAIN CYSTO-URO LG1000N (MISCELLANEOUS) IMPLANT
BRUSH SCRUB EZ 1% IODOPHOR (MISCELLANEOUS) IMPLANT
BULB IRRIG PATHFIND (MISCELLANEOUS) IMPLANT
CATH URETL 5X70 OPEN END (CATHETERS) IMPLANT
CNTNR SPEC 2.5X3XGRAD LEK (MISCELLANEOUS)
CONT SPEC 4OZ STER OR WHT (MISCELLANEOUS)
CONTAINER SPEC 2.5X3XGRAD LEK (MISCELLANEOUS) IMPLANT
DRAPE UTILITY 15X26 TOWEL STRL (DRAPES) IMPLANT
FIBER LASER LITHO 273 (Laser) IMPLANT
GLOVE BIOGEL PI IND STRL 7.5 (GLOVE) IMPLANT
GLOVE BIOGEL PI INDICATOR 7.5 (GLOVE)
GOWN STRL REUS W/ TWL LRG LVL3 (GOWN DISPOSABLE) IMPLANT
GOWN STRL REUS W/ TWL XL LVL3 (GOWN DISPOSABLE) IMPLANT
GOWN STRL REUS W/TWL LRG LVL3 (GOWN DISPOSABLE)
GOWN STRL REUS W/TWL XL LVL3 (GOWN DISPOSABLE)
GUIDEWIRE STR DUAL SENSOR (WIRE) IMPLANT
INFUSOR MANOMETER BAG 3000ML (MISCELLANEOUS) IMPLANT
INTRODUCER DILATOR DOUBLE (INTRODUCER) IMPLANT
KIT TURNOVER CYSTO (KITS) IMPLANT
PACK CYSTO AR (MISCELLANEOUS) IMPLANT
SET CYSTO W/LG BORE CLAMP LF (SET/KITS/TRAYS/PACK) IMPLANT
SHEATH URETERAL 12FRX35CM (MISCELLANEOUS) IMPLANT
SOL .9 NS 3000ML IRR  AL (IV SOLUTION)
SOL .9 NS 3000ML IRR UROMATIC (IV SOLUTION) IMPLANT
STENT URET 6FRX24 CONTOUR (STENTS) IMPLANT
STENT URET 6FRX26 CONTOUR (STENTS) IMPLANT
SURGILUBE 2OZ TUBE FLIPTOP (MISCELLANEOUS) IMPLANT
SYR 10ML LL (SYRINGE) IMPLANT
TUBING ART PRESS 48 MALE/FEM (TUBING) IMPLANT
VALVE UROSEAL ADJ ENDO (VALVE) IMPLANT
WATER STERILE IRR 1000ML POUR (IV SOLUTION) IMPLANT

## 2018-08-30 NOTE — Anesthesia Preprocedure Evaluation (Addendum)
Anesthesia Evaluation  Patient identified by MRN, date of birth, ID band Patient awake and Patient confused  General Assessment Comment:Hx reviewed with pt's daughter  Reviewed: Allergy & Precautions, NPO status , Patient's Chart, lab work & pertinent test results  History of Anesthesia Complications Negative for: history of anesthetic complications  Airway Mallampati: III  TM Distance: >3 FB     Dental  (+) Poor Dentition, Missing   Pulmonary neg sleep apnea, neg COPD, former smoker,           Cardiovascular hypertension, + CAD, + Past MI, + CABG (2007) and + Peripheral Vascular Disease  + Valvular Problems/Murmurs (s/p AVR 1031)  - Systolic murmurs    Neuro/Psych Seizures -,  CVA negative psych ROS   GI/Hepatic negative GI ROS, Neg liver ROS,   Endo/Other  diabetes, Oral Hypoglycemic Agents  Renal/GU ARFRenal disease     Musculoskeletal negative musculoskeletal ROS (+)   Abdominal (+) - obese,   Peds  Hematology  (+) Blood dyscrasia, anemia ,   Anesthesia Other Findings Past Medical History: No date: Anemia No date: Aortic valve disorder No date: Coronary artery disease No date: Diabetes mellitus without complication (HCC) No date: Glaucoma No date: History of BPH No date: History of kidney stones No date: HOH (hard of hearing)     Comment:  Bilateral Hearing Aids No date: Hyperlipidemia No date: Hypertension No date: Kidney stones 04/01/2016: MRSA (methicillin resistant staph aureus) culture positive     Comment:  LEFT FOOT No date: Myocardial infarction ([place]) No date: Neuropathy No date: Peripheral vascular disease ([place])   Reproductive/Obstetrics                            Anesthesia Physical  Anesthesia Plan  ASA: III  Anesthesia Plan: General ETT   Post-op Pain Management:    Induction: Intravenous  PONV Risk Score and Plan: 1 and Ondansetron  Airway Management  Planned: Oral ETT  Additional Equipment:   Intra-op Plan:   Post-operative Plan: Extubation in OR  Informed Consent: I have reviewed the patients History and Physical, chart, labs and discussed the procedure including the risks, benefits and alternatives for the proposed anesthesia with the patient or authorized representative who has indicated his/her understanding and acceptance.     Dental advisory given  Plan Discussed with: CRNA and Anesthesiologist  Anesthesia Plan Comments:        Anesthesia Quick Evaluation

## 2018-08-30 NOTE — H&P (Signed)
UROLOGY H&P UPDATE  Mr. Ozment is an 85 year old comorbid male with past medical history notable for CAD on Plavix, history of stroke, diabetes, peripheral vascular disease, and dementia who originally presented 07/04/2018 with altered mental status, fever to 101.7, and CT showing a right 38mm proximal ureteral stone with hydronephrosis.  He underwent urgent right ureteral stent placement at that time.  Urine cultures ultimately showed <10K insignificant growth, and blood cultures were negative.  His definitive ureteroscopy was delayed as the family did not want him to undergo surgery during the COVID-19 pandemic.  He presents today for definitive stone management with right ureteroscopy, laser lithotripsy, stent exchange.  A urinalysis was apparently not obtained at his pre-op appointment as ordered secondary to the patient being incontinent.  Urinalysis in pre-op today shows no bacteria, >50 WBCs, and budding yeast. Will send for culture.  I discussed the need to delay surgery at length with the patient and his daughter.  Will start on fluconazole 200 mg daily x10 days, send urine for culture.  We will aim to reschedule within 1 to 2 weeks.  Nickolas Madrid, MD 08/30/2018

## 2018-08-31 DIAGNOSIS — D539 Nutritional anemia, unspecified: Secondary | ICD-10-CM | POA: Diagnosis not present

## 2018-08-31 DIAGNOSIS — E1122 Type 2 diabetes mellitus with diabetic chronic kidney disease: Secondary | ICD-10-CM | POA: Diagnosis not present

## 2018-08-31 DIAGNOSIS — L89151 Pressure ulcer of sacral region, stage 1: Secondary | ICD-10-CM | POA: Diagnosis not present

## 2018-08-31 DIAGNOSIS — I2510 Atherosclerotic heart disease of native coronary artery without angina pectoris: Secondary | ICD-10-CM | POA: Diagnosis not present

## 2018-08-31 DIAGNOSIS — N400 Enlarged prostate without lower urinary tract symptoms: Secondary | ICD-10-CM | POA: Diagnosis not present

## 2018-08-31 DIAGNOSIS — I129 Hypertensive chronic kidney disease with stage 1 through stage 4 chronic kidney disease, or unspecified chronic kidney disease: Secondary | ICD-10-CM | POA: Diagnosis not present

## 2018-08-31 DIAGNOSIS — N182 Chronic kidney disease, stage 2 (mild): Secondary | ICD-10-CM | POA: Diagnosis not present

## 2018-08-31 DIAGNOSIS — M6281 Muscle weakness (generalized): Secondary | ICD-10-CM | POA: Diagnosis not present

## 2018-08-31 DIAGNOSIS — I359 Nonrheumatic aortic valve disorder, unspecified: Secondary | ICD-10-CM | POA: Diagnosis not present

## 2018-09-01 LAB — URINE CULTURE: Culture: 30000 — AB

## 2018-09-03 ENCOUNTER — Telehealth: Payer: Self-pay | Admitting: Family Medicine

## 2018-09-03 DIAGNOSIS — M6281 Muscle weakness (generalized): Secondary | ICD-10-CM | POA: Diagnosis not present

## 2018-09-03 DIAGNOSIS — I359 Nonrheumatic aortic valve disorder, unspecified: Secondary | ICD-10-CM | POA: Diagnosis not present

## 2018-09-03 DIAGNOSIS — D539 Nutritional anemia, unspecified: Secondary | ICD-10-CM | POA: Diagnosis not present

## 2018-09-03 DIAGNOSIS — E1122 Type 2 diabetes mellitus with diabetic chronic kidney disease: Secondary | ICD-10-CM | POA: Diagnosis not present

## 2018-09-03 DIAGNOSIS — N400 Enlarged prostate without lower urinary tract symptoms: Secondary | ICD-10-CM | POA: Diagnosis not present

## 2018-09-03 DIAGNOSIS — I2510 Atherosclerotic heart disease of native coronary artery without angina pectoris: Secondary | ICD-10-CM | POA: Diagnosis not present

## 2018-09-03 DIAGNOSIS — I129 Hypertensive chronic kidney disease with stage 1 through stage 4 chronic kidney disease, or unspecified chronic kidney disease: Secondary | ICD-10-CM | POA: Diagnosis not present

## 2018-09-03 DIAGNOSIS — L89152 Pressure ulcer of sacral region, stage 2: Secondary | ICD-10-CM | POA: Diagnosis not present

## 2018-09-03 DIAGNOSIS — N182 Chronic kidney disease, stage 2 (mild): Secondary | ICD-10-CM | POA: Diagnosis not present

## 2018-09-03 MED ORDER — AMPICILLIN 500 MG PO CAPS: 500.0000 mg | ORAL_CAPSULE | Freq: Four times a day (QID) | ORAL | 0 refills | Status: DC

## 2018-09-03 NOTE — Telephone Encounter (Signed)
-----   Message from Billey Co, MD sent at 09/03/2018  9:26 AM EDT ----- Patient should start on 500mg  ampicillin 4x daily for the 5 days leading up to his upcoming surgery to sterilize the urine. He should also be on 5 days fluconazole 200mg  daily for the 5 days prior to surgery. He does not need a repeat urine before surgery.  Thanks Nickolas Madrid, MD 09/03/2018

## 2018-09-03 NOTE — Telephone Encounter (Signed)
Patient notified and voiced understanding.

## 2018-09-05 ENCOUNTER — Other Ambulatory Visit: Payer: Self-pay | Admitting: Radiology

## 2018-09-05 DIAGNOSIS — N201 Calculus of ureter: Secondary | ICD-10-CM

## 2018-09-06 ENCOUNTER — Other Ambulatory Visit
Admission: RE | Admit: 2018-09-06 | Discharge: 2018-09-06 | Disposition: A | Discharge status: Home / Self Care | Payer: Medicare HMO | Source: Ambulatory Visit | Attending: Urology | Admitting: Urology

## 2018-09-06 ENCOUNTER — Other Ambulatory Visit: Payer: Self-pay

## 2018-09-06 DIAGNOSIS — Z1159 Encounter for screening for other viral diseases: Secondary | ICD-10-CM | POA: Insufficient documentation

## 2018-09-07 DIAGNOSIS — L89152 Pressure ulcer of sacral region, stage 2: Secondary | ICD-10-CM | POA: Diagnosis not present

## 2018-09-07 DIAGNOSIS — I359 Nonrheumatic aortic valve disorder, unspecified: Secondary | ICD-10-CM | POA: Diagnosis not present

## 2018-09-07 DIAGNOSIS — N182 Chronic kidney disease, stage 2 (mild): Secondary | ICD-10-CM | POA: Diagnosis not present

## 2018-09-07 DIAGNOSIS — D539 Nutritional anemia, unspecified: Secondary | ICD-10-CM | POA: Diagnosis not present

## 2018-09-07 DIAGNOSIS — N400 Enlarged prostate without lower urinary tract symptoms: Secondary | ICD-10-CM | POA: Diagnosis not present

## 2018-09-07 DIAGNOSIS — I2510 Atherosclerotic heart disease of native coronary artery without angina pectoris: Secondary | ICD-10-CM | POA: Diagnosis not present

## 2018-09-07 DIAGNOSIS — E1122 Type 2 diabetes mellitus with diabetic chronic kidney disease: Secondary | ICD-10-CM | POA: Diagnosis not present

## 2018-09-07 DIAGNOSIS — I129 Hypertensive chronic kidney disease with stage 1 through stage 4 chronic kidney disease, or unspecified chronic kidney disease: Secondary | ICD-10-CM | POA: Diagnosis not present

## 2018-09-07 DIAGNOSIS — M6281 Muscle weakness (generalized): Secondary | ICD-10-CM | POA: Diagnosis not present

## 2018-09-07 LAB — NOVEL CORONAVIRUS, NAA (HOSP ORDER, SEND-OUT TO REF LAB; TAT 18-24 HRS): SARS-CoV-2, NAA: NOT DETECTED

## 2018-09-09 MED ORDER — CEFAZOLIN SODIUM-DEXTROSE 2-4 GM/100ML-% IV SOLN: 2.0000 g | INTRAVENOUS | Status: DC

## 2018-09-10 ENCOUNTER — Encounter: Payer: Self-pay | Admitting: *Deleted

## 2018-09-10 ENCOUNTER — Ambulatory Visit: Payer: Medicare HMO | Admitting: Certified Registered"

## 2018-09-10 ENCOUNTER — Telehealth: Payer: Self-pay | Admitting: Urology

## 2018-09-10 ENCOUNTER — Encounter
Admission: RE | Disposition: A | Discharge status: Home / Self Care | Payer: Self-pay | Source: Home / Self Care | Attending: Urology

## 2018-09-10 ENCOUNTER — Other Ambulatory Visit: Payer: Self-pay

## 2018-09-10 ENCOUNTER — Ambulatory Visit: Payer: Medicare HMO

## 2018-09-10 ENCOUNTER — Ambulatory Visit
Admission: RE | Admit: 2018-09-10 | Discharge: 2018-09-10 | Disposition: A | Discharge status: Home / Self Care | Payer: Medicare HMO | Attending: Urology | Admitting: Urology

## 2018-09-10 DIAGNOSIS — F0390 Unspecified dementia without behavioral disturbance: Secondary | ICD-10-CM | POA: Diagnosis not present

## 2018-09-10 DIAGNOSIS — E1140 Type 2 diabetes mellitus with diabetic neuropathy, unspecified: Secondary | ICD-10-CM | POA: Insufficient documentation

## 2018-09-10 DIAGNOSIS — I10 Essential (primary) hypertension: Secondary | ICD-10-CM | POA: Insufficient documentation

## 2018-09-10 DIAGNOSIS — I2510 Atherosclerotic heart disease of native coronary artery without angina pectoris: Secondary | ICD-10-CM | POA: Diagnosis not present

## 2018-09-10 DIAGNOSIS — Z951 Presence of aortocoronary bypass graft: Secondary | ICD-10-CM | POA: Insufficient documentation

## 2018-09-10 DIAGNOSIS — Z8673 Personal history of transient ischemic attack (TIA), and cerebral infarction without residual deficits: Secondary | ICD-10-CM | POA: Insufficient documentation

## 2018-09-10 DIAGNOSIS — Z7902 Long term (current) use of antithrombotics/antiplatelets: Secondary | ICD-10-CM | POA: Diagnosis not present

## 2018-09-10 DIAGNOSIS — E1151 Type 2 diabetes mellitus with diabetic peripheral angiopathy without gangrene: Secondary | ICD-10-CM | POA: Diagnosis not present

## 2018-09-10 DIAGNOSIS — Z89422 Acquired absence of other left toe(s): Secondary | ICD-10-CM | POA: Insufficient documentation

## 2018-09-10 DIAGNOSIS — N201 Calculus of ureter: Secondary | ICD-10-CM | POA: Insufficient documentation

## 2018-09-10 DIAGNOSIS — E785 Hyperlipidemia, unspecified: Secondary | ICD-10-CM | POA: Diagnosis not present

## 2018-09-10 DIAGNOSIS — I252 Old myocardial infarction: Secondary | ICD-10-CM | POA: Diagnosis not present

## 2018-09-10 DIAGNOSIS — Z87891 Personal history of nicotine dependence: Secondary | ICD-10-CM | POA: Diagnosis not present

## 2018-09-10 DIAGNOSIS — Z952 Presence of prosthetic heart valve: Secondary | ICD-10-CM | POA: Insufficient documentation

## 2018-09-10 HISTORY — PX: CYSTOSCOPY/URETEROSCOPY/HOLMIUM LASER/STENT PLACEMENT: SHX6546

## 2018-09-10 LAB — GLUCOSE, CAPILLARY
Glucose-Capillary: 183 mg/dL — ABNORMAL HIGH (ref 70–99)
Glucose-Capillary: 165 mg/dL — ABNORMAL HIGH (ref 70–99)

## 2018-09-10 SURGERY — CYSTOSCOPY/URETEROSCOPY/HOLMIUM LASER/STENT PLACEMENT
Anesthesia: General | Laterality: Right

## 2018-09-10 MED ORDER — DEXAMETHASONE SODIUM PHOSPHATE 10 MG/ML IJ SOLN
INTRAMUSCULAR | Status: AC
  Filled 2018-09-10: qty 2

## 2018-09-10 MED ORDER — FENTANYL CITRATE (PF) 250 MCG/5ML IJ SOLN
INTRAMUSCULAR | Status: AC
  Filled 2018-09-10: qty 5

## 2018-09-10 MED ORDER — DEXAMETHASONE SODIUM PHOSPHATE 10 MG/ML IJ SOLN
INTRAMUSCULAR | Status: DC | PRN
  Administered 2018-09-10: 10 mg via INTRAVENOUS

## 2018-09-10 MED ORDER — PROPOFOL 10 MG/ML IV BOLUS
INTRAVENOUS | Status: AC
  Filled 2018-09-10: qty 20

## 2018-09-10 MED ORDER — SODIUM CHLORIDE 0.9 % IV SOLN
1.0000 g | Freq: Once | INTRAVENOUS | Status: AC
  Administered 2018-09-10: 13:00:00 1 g via INTRAVENOUS
  Filled 2018-09-10: qty 1

## 2018-09-10 MED ORDER — FLUCONAZOLE 100 MG PO TABS: 100.0000 mg | ORAL_TABLET | Freq: Once | ORAL | 0 refills | Status: AC

## 2018-09-10 MED ORDER — AMOXICILLIN 500 MG PO CAPS: 500.0000 mg | ORAL_CAPSULE | Freq: Every day | ORAL | 0 refills | Status: DC

## 2018-09-10 MED ORDER — ONDANSETRON HCL 4 MG/2ML IJ SOLN
INTRAMUSCULAR | Status: AC
  Filled 2018-09-10: qty 4

## 2018-09-10 MED ORDER — LIDOCAINE HCL (PF) 2 % IJ SOLN
INTRAMUSCULAR | Status: AC
  Filled 2018-09-10: qty 10

## 2018-09-10 MED ORDER — CEFAZOLIN SODIUM-DEXTROSE 2-4 GM/100ML-% IV SOLN
INTRAVENOUS | Status: AC
  Filled 2018-09-10: qty 100

## 2018-09-10 MED ORDER — FLUCONAZOLE IN SODIUM CHLORIDE 400-0.9 MG/200ML-% IV SOLN
400.0000 mg | Freq: Once | INTRAVENOUS | Status: AC
  Administered 2018-09-10: 400 mg via INTRAVENOUS
  Filled 2018-09-10: qty 200

## 2018-09-10 MED ORDER — SUCCINYLCHOLINE CHLORIDE 20 MG/ML IJ SOLN
INTRAMUSCULAR | Status: AC
  Filled 2018-09-10: qty 1

## 2018-09-10 MED ORDER — ROCURONIUM BROMIDE 50 MG/5ML IV SOLN
INTRAVENOUS | Status: AC
  Filled 2018-09-10: qty 2

## 2018-09-10 MED ORDER — IOPAMIDOL (ISOVUE-M 200) INJECTION 41%
INTRAMUSCULAR | Status: DC | PRN
  Administered 2018-09-10: 3 mL

## 2018-09-10 MED ORDER — EPHEDRINE SULFATE 50 MG/ML IJ SOLN
INTRAMUSCULAR | Status: AC
  Filled 2018-09-10: qty 2

## 2018-09-10 MED ORDER — PROPOFOL 10 MG/ML IV BOLUS
INTRAVENOUS | Status: DC | PRN
  Administered 2018-09-10: 120 mg via INTRAVENOUS

## 2018-09-10 MED ORDER — LIDOCAINE HCL (CARDIAC) PF 100 MG/5ML IV SOSY
PREFILLED_SYRINGE | INTRAVENOUS | Status: DC | PRN
  Administered 2018-09-10: 80 mg via INTRAVENOUS

## 2018-09-10 MED ORDER — MIDAZOLAM HCL 2 MG/2ML IJ SOLN
INTRAMUSCULAR | Status: AC
  Filled 2018-09-10: qty 2

## 2018-09-10 MED ORDER — FAMOTIDINE 20 MG PO TABS
ORAL_TABLET | ORAL | Status: AC
  Administered 2018-09-10: 20 mg
  Filled 2018-09-10: qty 1

## 2018-09-10 MED ORDER — PHENYLEPHRINE HCL (PRESSORS) 10 MG/ML IV SOLN
INTRAVENOUS | Status: AC
  Filled 2018-09-10: qty 1

## 2018-09-10 MED ORDER — FENTANYL CITRATE (PF) 100 MCG/2ML IJ SOLN: 25.0000 ug | INTRAMUSCULAR | Status: DC | PRN

## 2018-09-10 MED ORDER — ROCURONIUM BROMIDE 100 MG/10ML IV SOLN
INTRAVENOUS | Status: DC | PRN
  Administered 2018-09-10: 30 mg via INTRAVENOUS

## 2018-09-10 MED ORDER — SUGAMMADEX SODIUM 500 MG/5ML IV SOLN
INTRAVENOUS | Status: AC
  Filled 2018-09-10: qty 5

## 2018-09-10 MED ORDER — ONDANSETRON HCL 4 MG/2ML IJ SOLN
INTRAMUSCULAR | Status: DC | PRN
  Administered 2018-09-10: 4 mg via INTRAVENOUS

## 2018-09-10 MED ORDER — SODIUM CHLORIDE 0.9 % IV SOLN
INTRAVENOUS | Status: AC
  Administered 2018-09-10 (×2): via INTRAVENOUS

## 2018-09-10 MED ORDER — SUGAMMADEX SODIUM 500 MG/5ML IV SOLN
INTRAVENOUS | Status: DC | PRN
  Administered 2018-09-10: 300 mg via INTRAVENOUS

## 2018-09-10 MED ORDER — ONDANSETRON HCL 4 MG/2ML IJ SOLN: 4.0000 mg | Freq: Once | INTRAMUSCULAR | Status: DC | PRN

## 2018-09-10 SURGICAL SUPPLY — 35 items
BAG DRAIN CYSTO-URO LG1000N (MISCELLANEOUS) ×3 IMPLANT
BRUSH SCRUB EZ 1% IODOPHOR (MISCELLANEOUS) ×3 IMPLANT
BULB IRRIG PATHFIND (MISCELLANEOUS) IMPLANT
CATH COUDE FOLEY 2W 5CC 18FR (CATHETERS) ×3 IMPLANT
CATH URETL 5X70 OPEN END (CATHETERS) ×3 IMPLANT
CNTNR SPEC 2.5X3XGRAD LEK (MISCELLANEOUS)
CONT SPEC 4OZ STER OR WHT (MISCELLANEOUS)
CONTAINER SPEC 2.5X3XGRAD LEK (MISCELLANEOUS) IMPLANT
DRAPE C-ARM XRAY 36X54 (DRAPES) ×3 IMPLANT
DRAPE UTILITY 15X26 TOWEL STRL (DRAPES) ×3 IMPLANT
FIBER LASER LITHO 273 (Laser) IMPLANT
GLOVE BIOGEL PI IND STRL 7.5 (GLOVE) ×1 IMPLANT
GLOVE BIOGEL PI INDICATOR 7.5 (GLOVE) ×2
GOWN STRL REUS W/ TWL LRG LVL3 (GOWN DISPOSABLE) ×1 IMPLANT
GOWN STRL REUS W/ TWL XL LVL3 (GOWN DISPOSABLE) ×1 IMPLANT
GOWN STRL REUS W/TWL LRG LVL3 (GOWN DISPOSABLE) ×2
GOWN STRL REUS W/TWL XL LVL3 (GOWN DISPOSABLE) ×2
GUIDEWIRE STR DUAL SENSOR (WIRE) ×6 IMPLANT
INFUSOR MANOMETER BAG 3000ML (MISCELLANEOUS) ×3 IMPLANT
INTRODUCER DILATOR DOUBLE (INTRODUCER) IMPLANT
KIT TURNOVER CYSTO (KITS) ×3 IMPLANT
PACK CYSTO AR (MISCELLANEOUS) ×3 IMPLANT
SET CYSTO W/LG BORE CLAMP LF (SET/KITS/TRAYS/PACK) ×3 IMPLANT
SHEATH URETERAL 12FRX35CM (MISCELLANEOUS) IMPLANT
SOL .9 NS 3000ML IRR  AL (IV SOLUTION) ×2
SOL .9 NS 3000ML IRR UROMATIC (IV SOLUTION) ×1 IMPLANT
STENT URET 6FRX24 CONTOUR (STENTS) IMPLANT
STENT URET 6FRX26 CONTOUR (STENTS) IMPLANT
STENT URET 6FRX28 CONTOUR (STENTS) ×3 IMPLANT
SURGILUBE 2OZ TUBE FLIPTOP (MISCELLANEOUS) ×3 IMPLANT
SYR 10ML LL (SYRINGE) ×3 IMPLANT
SYRINGE IRR TOOMEY STRL 70CC (SYRINGE) ×3 IMPLANT
TUBING ART PRESS 48 MALE/FEM (TUBING) IMPLANT
VALVE UROSEAL ADJ ENDO (VALVE) IMPLANT
WATER STERILE IRR 1000ML POUR (IV SOLUTION) ×3 IMPLANT

## 2018-09-10 NOTE — Telephone Encounter (Signed)
APP MADE ° °

## 2018-09-10 NOTE — Anesthesia Preprocedure Evaluation (Signed)
Anesthesia Evaluation  Patient identified by MRN, date of birth, ID band Patient awake    Reviewed: Allergy & Precautions, NPO status , Patient's Chart, lab work & pertinent test results  History of Anesthesia Complications Negative for: history of anesthetic complications  Airway Mallampati: II  TM Distance: >3 FB Neck ROM: Full    Dental  (+) Poor Dentition   Pulmonary neg sleep apnea, neg COPD, former smoker,    breath sounds clear to auscultation- rhonchi (-) wheezing      Cardiovascular hypertension, + CAD, + Past MI, + CABG (3 vessel 2007) and + Peripheral Vascular Disease  (-) Cardiac Stents + Valvular Problems/Murmurs (s/p AVR 2007)  Rhythm:Regular Rate:Normal - Systolic murmurs and - Diastolic murmurs    Neuro/Psych Seizures - (last seizure 2 yrs ago, thought to be medication related), Well Controlled,  CVA negative psych ROS   GI/Hepatic negative GI ROS, Neg liver ROS,   Endo/Other  diabetes  Renal/GU Renal disease: nephrolithiasis.     Musculoskeletal negative musculoskeletal ROS (+)   Abdominal (+) - obese,   Peds  Hematology  (+) anemia ,   Anesthesia Other Findings Past Medical History: No date: Anemia No date: Aortic valve disorder No date: Coronary artery disease No date: Diabetes mellitus without complication (HCC) No date: Glaucoma No date: History of BPH No date: History of kidney stones No date: HOH (hard of hearing)     Comment:  Bilateral Hearing Aids No date: Hyperlipidemia No date: Hypertension No date: Kidney stones 04/01/2016: MRSA (methicillin resistant staph aureus) culture positive     Comment:  LEFT FOOT No date: Myocardial infarction ([place]) No date: Neuropathy No date: Peripheral vascular disease (HCC)   Reproductive/Obstetrics                             Anesthesia Physical Anesthesia Plan  ASA: III  Anesthesia Plan: General   Post-op Pain  Management:    Induction: Intravenous  PONV Risk Score and Plan: 1 and Ondansetron  Airway Management Planned: Oral ETT  Additional Equipment:   Intra-op Plan:   Post-operative Plan: Extubation in OR  Informed Consent: I have reviewed the patients History and Physical, chart, labs and discussed the procedure including the risks, benefits and alternatives for the proposed anesthesia with the patient or authorized representative who has indicated his/her understanding and acceptance.     Dental advisory given  Plan Discussed with: CRNA and Anesthesiologist  Anesthesia Plan Comments:         Anesthesia Quick Evaluation

## 2018-09-10 NOTE — Anesthesia Procedure Notes (Signed)
Procedure Name: Intubation Performed by: Kelton Pillar, CRNA Pre-anesthesia Checklist: Patient identified, Emergency Drugs available, Suction available and Patient being monitored Patient Re-evaluated:Patient Re-evaluated prior to induction Oxygen Delivery Method: Circle system utilized Preoxygenation: Pre-oxygenation with 100% oxygen Induction Type: IV induction Ventilation: Mask ventilation without difficulty Laryngoscope Size: McGraph and 3 Grade View: Grade I Tube type: Oral Tube size: 7.0 mm Number of attempts: 1 Airway Equipment and Method: Stylet Placement Confirmation: ETT inserted through vocal cords under direct vision,  positive ETCO2,  CO2 detector and breath sounds checked- equal and bilateral Secured at: 22 cm Tube secured with: Tape Dental Injury: Teeth and Oropharynx as per pre-operative assessment

## 2018-09-10 NOTE — Progress Notes (Signed)
Removed urinary catheter at 1443 as per Dr Doristine Counter orders. 1.8 liters of fluid given IV to encourage voiding. Patient voided before being discharged.

## 2018-09-10 NOTE — Anesthesia Post-op Follow-up Note (Signed)
Anesthesia QCDR form completed.        

## 2018-09-10 NOTE — Telephone Encounter (Signed)
-----   Message from Billey Co, MD sent at 09/10/2018  1:21 PM EDT ----- Regarding: stent removal Please schedule clinic cysto and stent removal in 5-7 days, ok to Dorene Ar, MD 09/10/2018

## 2018-09-10 NOTE — Transfer of Care (Signed)
Immediate Anesthesia Transfer of Care Note  Patient: Matthew Bruce  Procedure(s) Performed: CYSTOSCOPY/URETEROSCOPY/HOLMIUM LASER/STENT PLACEMENT (Right )  Patient Location: PACU  Anesthesia Type:General  Level of Consciousness: awake, alert  and patient cooperative  Airway & Oxygen Therapy: Patient Spontanous Breathing and Patient connected to face mask oxygen  Post-op Assessment: Report given to RN  Post vital signs: Reviewed and stable  Last Vitals:  Vitals Value Taken Time  BP 119/68 09/10/2018  1:28 PM  Temp    Pulse 82 09/10/2018  1:30 PM  Resp 11 09/10/2018  1:30 PM  SpO2 100 % 09/10/2018  1:30 PM  Vitals shown include unvalidated device data.  Last Pain:  Vitals:   09/10/18 1020  TempSrc: Temporal         Complications: No apparent anesthesia complications

## 2018-09-10 NOTE — Op Note (Signed)
Date of procedure: 09/10/18  Preoperative diagnosis:  1. Right 45mm proximal ureteral stone, pre-stented for UTI   Postoperative diagnosis:  1. Same  Procedure: 1. Cystoscopy, right ureteroscopy, laser lithotripsy, right retrograde pyelogram with intraoperative interpretation, right ureteral stent placement  Surgeon: Nickolas Madrid, MD  Anesthesia: General  Complications: None  Intraoperative findings:  1.  Uncomplicated dusting of right 8 mm stone and ureteral stent placement  EBL: Minimal  Specimens: None  Drains: Right 6 French by 28 cm ureteral stent, 18 French Foley  Indication: Matthew Bruce is a 85 y.o. patient with multiple comorbidities who originally presented in April 2020 with an infected 8 mm right proximal ureteral stone and underwent urgent stent placement at that time.  His definitive known management has been delayed as his family did not want him to undergo surgery during the COVID-19 pandemic.  After reviewing the management options for treatment, they elected to proceed with the above surgical procedure(s). We have discussed the potential benefits and risks of the procedure, side effects of the proposed treatment, the likelihood of the patient achieving the goals of the procedure, and any potential problems that might occur during the procedure or recuperation. Informed consent has been obtained.  Description of procedure:  The patient was taken to the operating room and general anesthesia was induced. SCDs were placed for DVT prophylaxis.  The patient was placed in the dorsal lithotomy position, prepped and draped in the usual sterile fashion, and preoperative antibiotics were administered (Ampicillin and fluconazole. A preoperative time-out was performed.   A 21 French rigid cystoscope was used to intubate the urethra.  Normal-appearing urethra was followed proximally into the bladder.  The right ureteral stent was seen emanating from the right ureteral  orifice.  A sensor wire was advanced under fluoroscopic vision alongside the stent into the collecting system.  The ureteral stent was then removed.  A single-channel flexible ureteroscope was advanced over the wire under fluoroscopic vision up into the kidney.  Thorough inspection revealed an 8 mm black stone consistent with calcium oxalate in the upper pole.  This was dusted to <1 mm fragments using a 273 m laser fiber on settings of 0.3 J and 40 Hz. Thorough pyeloscopy revealed no residual fragments.  Retrograde pyelogram demonstrated no extravasation or filling defects.  The sensor wire was replaced through the scope into the renal pelvis.  Careful pullback ureteroscopy demonstrated no ureteral injury or residual fragments.  A 6 French by 28 cm stent was uneventfully placed over the wire with excellent curl in the upper pole on fluoroscopy, as well as under direct vision in the bladder.  An 4 French coud Foley was placed to maximize drainage in the immediate postoperative period and reduce the risk of infection.  Disposition: Stable to PACU  Plan: Remove Foley prior to discharge today, must void prior to discharge Amoxicillin prophylaxis while stent in place, fluconazole prior to stent removal Stent removal in 5 to 7 days  Nickolas Madrid, MD

## 2018-09-10 NOTE — H&P (Signed)
09/10/2018 10:53 AM   Matthew Bruce 03/29/1927 [id number]  CC: Right 36mm proximal ureteral stone, pre-stented for infection  HPI: Matthew Bruce is an 85 year old comorbid male with past medical history notable for CAD and aortic valve replacement on Plavix, history of stroke, diabetes, peripheral vascular disease, and dementia who originally presented 07/04/2018 with altered mental status, fever to 101.7, and CT showing a right 2mm proximal ureteral stone with hydronephrosis.  He underwent urgent right ureteral stent placement at that time.  Urine cultures ultimately showed <10K insignificant growth, and blood cultures were negative.  His definitive ureteroscopy was delayed as the family did not want him to undergo surgery during the COVID-19 pandemic.  He presents today for definitive stone management with right ureteroscopy, laser lithotripsy, stent exchange.  He was originally scheduled for definitive stone management on 08/30/2018, however urinalysis at that time showed budding yeast, and ultimately grew enterococcus.  He has been on culture appropriate ampicillin and fluconazole.  He is minimally mobile and lives in a rehab facility, and is incontinent of urine at baseline, and is very difficult to obtain urine specimen from the patient.  He denies any chest pain, shortness of breath, fevers, or chills.  Has been on 5 days of culture appropriate ampicillin and fluconazole.   PMH: Past Medical History:  Diagnosis Date  . Anemia   . Aortic valve disorder   . Coronary artery disease   . Diabetes mellitus without complication ([place])   . Glaucoma   . History of BPH   . History of kidney stones   . HOH (hard of hearing)    Bilateral Hearing Aids  . Hyperlipidemia   . Hypertension   . Kidney stones   . MRSA (methicillin resistant staph aureus) culture positive 04/01/2016   LEFT FOOT  . Myocardial infarction ([place])   . Neuropathy   . Peripheral vascular disease [facility])     Surgical  History: Past Surgical History:  Procedure Laterality Date  . AMPUTATION TOE Left 02/11/2016   Procedure: AMPUTATION TOE;  Surgeon: Albertine Patricia, DPM;  Location: [facility];  Service: Podiatry;  Laterality: Left;  . AMPUTATION TOE Left 08/19/2016   Procedure: AMPUTATION TOE;  Surgeon: Albertine Patricia, DPM;  Location: [facility];  Service: Podiatry;  Laterality: Left;  . APLIGRAFT PLACEMENT Right 08/19/2016   Procedure: APLIGRAFT PLACEMENT;  Surgeon: Albertine Patricia, DPM;  Location: [facility];  Service: Podiatry;  Laterality: Right;  . APPLICATION OF WOUND VAC Left 04/01/2016   Procedure: APPLICATION OF WOUND VAC;  Surgeon: Albertine Patricia, DPM;  Location: [facility];  Service: Podiatry;  Laterality: Left;  . CARDIAC CATHETERIZATION    . CARDIAC SURGERY    . CARDIAC VALVE REPLACEMENT    . CORONARY ARTERY BYPASS GRAFT    . CYSTOSCOPY W/ RETROGRADES Right 07/04/2018   Procedure: CYSTOSCOPY WITH RETROGRADE PYELOGRAM;  Surgeon: Billey Co, MD;  Location: [facility];  Service: Urology;  Laterality: Right;  . CYSTOSCOPY WITH STENT PLACEMENT Right 07/04/2018   Procedure: CYSTOSCOPY WITH STENT PLACEMENT;  Surgeon: Billey Co, MD;  Location: [facility];  Service: Urology;  Laterality: Right;  . INCISION AND DRAINAGE Right 08/19/2016   Procedure: IRRIGATION AND DEBRIDEMENT RIGHT GREAT TOE;  Surgeon: Albertine Patricia, DPM;  Location: [facility];  Service: Podiatry;  Laterality: Right;  . IRRIGATION AND DEBRIDEMENT FOOT Left 04/01/2016   Procedure: IRRIGATION AND DEBRIDEMENT FOOT;  Surgeon: Albertine Patricia, DPM;  Location: [facility];  Service: Podiatry;  Laterality: Left;  . IRRIGATION AND DEBRIDEMENT  FOOT Left 05/13/2016   Procedure: IRRIGATION AND DEBRIDEMENT FOOT;  Surgeon: Albertine Patricia, DPM;  Location: [facility];  Service: Podiatry;  Laterality: Left;  . LOWER EXTREMITY ANGIOGRAPHY Right 07/06/2016   Procedure: Lower Extremity Angiography;  Surgeon: Algernon Huxley, MD;  Location: [place] [facility];   Service: Cardiovascular;  Laterality: Right;  . LOWER EXTREMITY ANGIOGRAPHY Right 07/18/2016   Procedure: Lower Extremity Angiography;  Surgeon: Algernon Huxley, MD;  Location: [place] [facility];  Service: Cardiovascular;  Laterality: Right;  . OSTECTOMY Left 05/13/2016   Procedure: OSTECTOMY;  Surgeon: Albertine Patricia, DPM;  Location: [facility];  Service: Podiatry;  Laterality: Left;  . PERIPHERAL VASCULAR CATHETERIZATION Left 02/03/2016   Procedure: Lower Extremity Angiography;  Surgeon: Algernon Huxley, MD;  Location: [place] [facility];  Service: Cardiovascular;  Laterality: Left;  . PERIPHERAL VASCULAR CATHETERIZATION Left 04/01/2016   Procedure: Lower Extremity Angiography;  Surgeon: Katha Cabal, MD;  Location: [place] [facility];  Service: Cardiovascular;  Laterality: Left;  Marland Kitchen VALVE REPLACEMENT  2007   Aortic Valve Replacement, St. Jude Porcine Valve    Allergies:  Allergies  Allergen Reactions  . Sulfa Antibiotics Other (See Comments)    Reaction: Severe Shaking, Weakness  . Macrobid [Nitrofurantoin Monohyd Macro] Diarrhea    Family History: Family History  Problem Relation Age of Onset  . Heart attack Mother   . Alcohol abuse Father   . Throat cancer Sister   . Heart attack Brother   . Heart failure Neg Hx     Social History:  reports that he has quit smoking. His smoking use included cigarettes. He smoked 1.00 pack per day. His smokeless tobacco use includes chew. He reports that he does not drink alcohol or use drugs.  ROS: Please see flowsheet from today's date for complete review of systems.  Physical Exam: BP 124/69   Pulse 98   Temp 97.7 F (36.5 C) (Temporal)   Resp 16   Ht 6' (1.829 m)   Wt 77.1 kg   SpO2 100%   BMI 23.05 kg/m    Constitutional: Resting comfortably, hard of hearing Cardiovascular: Regular rate and rhythm Respiratory: Clear to auscultation bilaterally GI: Abdomen is soft, nontender, nondistended, no abdominal masses Lymph: No  cervical or inguinal lymphadenopathy. Skin: No rashes, bruises or suspicious lesions.  Laboratory Data: Urine culture 08/30/2018: 30K enterococcus and yeast -> has been on culture appropriate ampicillin and fluconazole  Pertinent Imaging: I have personally reviewed the CT imaging 07/04/2018 with 8 mm right proximal ureteral stone and small right renal stone  Assessment & Plan:   85 year old comorbid male with history of 8 mm right proximal ureteral stone and fever back in 07/04/2018 that underwent emergent ureteral stent placement at that time.  His treatment has been delayed in the setting the COVID-19 pandemic and his family's refusal to undergo definitive stone management until this time.  His prior urinalysis grew low growth of enterococcus as well as yeast, and he has been on culture appropriate antibiotics to sterilize the urine prior to definitive management today.  We discussed the risks of surgery at length including bleeding, infection, sepsis, need for additional procedures, ureteral injury, need for temporary stent placement, stent related symptoms, need for stent removal in approximately 1 week.  Nickolas Madrid, MD 09/10/2018   [facility] [address], [place],  [address] [phone]

## 2018-09-10 NOTE — Discharge Instructions (Signed)
AMBULATORY SURGERY  °DISCHARGE INSTRUCTIONS ° ° °1) The drugs that you were given will stay in your system until tomorrow so for the next 24 hours you should not: ° °A) Drive an automobile °B) Make any legal decisions °C) Drink any alcoholic beverage ° ° °2) You may resume regular meals tomorrow.  Today it is better to start with liquids and gradually work up to solid foods. ° °You may eat anything you prefer, but it is better to start with liquids, then soup and crackers, and gradually work up to solid foods. ° ° °3) Please notify your doctor immediately if you have any unusual bleeding, trouble breathing, redness and pain at the surgery site, drainage, fever, or pain not relieved by medication. ° ° ° °4) Additional Instructions: ° ° ° ° ° ° ° °Please contact your physician with any problems or Same Day Surgery at [phone], Monday through Friday 6 am to 4 pm, or [place] at [place] Main number at [phone]. °

## 2018-09-11 ENCOUNTER — Encounter: Payer: Self-pay | Admitting: Urology

## 2018-09-11 DIAGNOSIS — D539 Nutritional anemia, unspecified: Secondary | ICD-10-CM | POA: Diagnosis not present

## 2018-09-11 DIAGNOSIS — M6281 Muscle weakness (generalized): Secondary | ICD-10-CM | POA: Diagnosis not present

## 2018-09-11 DIAGNOSIS — I2510 Atherosclerotic heart disease of native coronary artery without angina pectoris: Secondary | ICD-10-CM | POA: Diagnosis not present

## 2018-09-11 DIAGNOSIS — I359 Nonrheumatic aortic valve disorder, unspecified: Secondary | ICD-10-CM | POA: Diagnosis not present

## 2018-09-11 DIAGNOSIS — N182 Chronic kidney disease, stage 2 (mild): Secondary | ICD-10-CM | POA: Diagnosis not present

## 2018-09-11 DIAGNOSIS — N400 Enlarged prostate without lower urinary tract symptoms: Secondary | ICD-10-CM | POA: Diagnosis not present

## 2018-09-11 DIAGNOSIS — I129 Hypertensive chronic kidney disease with stage 1 through stage 4 chronic kidney disease, or unspecified chronic kidney disease: Secondary | ICD-10-CM | POA: Diagnosis not present

## 2018-09-11 DIAGNOSIS — E1122 Type 2 diabetes mellitus with diabetic chronic kidney disease: Secondary | ICD-10-CM | POA: Diagnosis not present

## 2018-09-11 DIAGNOSIS — L89152 Pressure ulcer of sacral region, stage 2: Secondary | ICD-10-CM | POA: Diagnosis not present

## 2018-09-11 NOTE — Anesthesia Postprocedure Evaluation (Signed)
Anesthesia Post Note  Patient: Matthew Bruce  Procedure(s) Performed: CYSTOSCOPY/URETEROSCOPY/HOLMIUM LASER/STENT PLACEMENT (Right )  Patient location during evaluation: PACU Anesthesia Type: General Level of consciousness: awake and alert Pain management: pain level controlled Vital Signs Assessment: post-procedure vital signs reviewed and stable Respiratory status: spontaneous breathing, nonlabored ventilation, respiratory function stable and patient connected to nasal cannula oxygen Cardiovascular status: blood pressure returned to baseline and stable Postop Assessment: no apparent nausea or vomiting Anesthetic complications: no     Last Vitals:  Vitals:   09/10/18 1414 09/10/18 1717  BP: 124/83 132/85  Pulse: 97 100  Resp: 14 14  Temp: 36.6 C 36.7 C  SpO2: 98% 97%    Last Pain:  Vitals:   09/11/18 0852  TempSrc:   PainSc: 0-No pain                 Martha Clan

## 2018-09-12 DIAGNOSIS — N182 Chronic kidney disease, stage 2 (mild): Secondary | ICD-10-CM | POA: Diagnosis not present

## 2018-09-12 DIAGNOSIS — D539 Nutritional anemia, unspecified: Secondary | ICD-10-CM | POA: Diagnosis not present

## 2018-09-12 DIAGNOSIS — E1122 Type 2 diabetes mellitus with diabetic chronic kidney disease: Secondary | ICD-10-CM | POA: Diagnosis not present

## 2018-09-12 DIAGNOSIS — I2510 Atherosclerotic heart disease of native coronary artery without angina pectoris: Secondary | ICD-10-CM | POA: Diagnosis not present

## 2018-09-12 DIAGNOSIS — I359 Nonrheumatic aortic valve disorder, unspecified: Secondary | ICD-10-CM | POA: Diagnosis not present

## 2018-09-12 DIAGNOSIS — M6281 Muscle weakness (generalized): Secondary | ICD-10-CM | POA: Diagnosis not present

## 2018-09-12 DIAGNOSIS — L89152 Pressure ulcer of sacral region, stage 2: Secondary | ICD-10-CM | POA: Diagnosis not present

## 2018-09-12 DIAGNOSIS — N400 Enlarged prostate without lower urinary tract symptoms: Secondary | ICD-10-CM | POA: Diagnosis not present

## 2018-09-12 DIAGNOSIS — I129 Hypertensive chronic kidney disease with stage 1 through stage 4 chronic kidney disease, or unspecified chronic kidney disease: Secondary | ICD-10-CM | POA: Diagnosis not present

## 2018-09-13 DIAGNOSIS — I2510 Atherosclerotic heart disease of native coronary artery without angina pectoris: Secondary | ICD-10-CM | POA: Diagnosis not present

## 2018-09-13 DIAGNOSIS — D539 Nutritional anemia, unspecified: Secondary | ICD-10-CM | POA: Diagnosis not present

## 2018-09-13 DIAGNOSIS — M6281 Muscle weakness (generalized): Secondary | ICD-10-CM | POA: Diagnosis not present

## 2018-09-13 DIAGNOSIS — N400 Enlarged prostate without lower urinary tract symptoms: Secondary | ICD-10-CM | POA: Diagnosis not present

## 2018-09-13 DIAGNOSIS — I359 Nonrheumatic aortic valve disorder, unspecified: Secondary | ICD-10-CM | POA: Diagnosis not present

## 2018-09-13 DIAGNOSIS — N182 Chronic kidney disease, stage 2 (mild): Secondary | ICD-10-CM | POA: Diagnosis not present

## 2018-09-13 DIAGNOSIS — I129 Hypertensive chronic kidney disease with stage 1 through stage 4 chronic kidney disease, or unspecified chronic kidney disease: Secondary | ICD-10-CM | POA: Diagnosis not present

## 2018-09-13 DIAGNOSIS — L89152 Pressure ulcer of sacral region, stage 2: Secondary | ICD-10-CM | POA: Diagnosis not present

## 2018-09-13 DIAGNOSIS — E1122 Type 2 diabetes mellitus with diabetic chronic kidney disease: Secondary | ICD-10-CM | POA: Diagnosis not present

## 2018-09-17 ENCOUNTER — Other Ambulatory Visit: Payer: Self-pay

## 2018-09-17 ENCOUNTER — Ambulatory Visit (INDEPENDENT_AMBULATORY_CARE_PROVIDER_SITE_OTHER): Payer: Medicare HMO | Admitting: Urology

## 2018-09-17 ENCOUNTER — Encounter: Payer: Self-pay | Admitting: Urology

## 2018-09-17 VITALS — BP 108/65 | HR 55 | Ht 72.0 in | Wt 176.0 lb

## 2018-09-17 DIAGNOSIS — I129 Hypertensive chronic kidney disease with stage 1 through stage 4 chronic kidney disease, or unspecified chronic kidney disease: Secondary | ICD-10-CM | POA: Diagnosis not present

## 2018-09-17 DIAGNOSIS — I2510 Atherosclerotic heart disease of native coronary artery without angina pectoris: Secondary | ICD-10-CM | POA: Diagnosis not present

## 2018-09-17 DIAGNOSIS — N200 Calculus of kidney: Secondary | ICD-10-CM

## 2018-09-17 DIAGNOSIS — M6281 Muscle weakness (generalized): Secondary | ICD-10-CM | POA: Diagnosis not present

## 2018-09-17 DIAGNOSIS — D539 Nutritional anemia, unspecified: Secondary | ICD-10-CM | POA: Diagnosis not present

## 2018-09-17 DIAGNOSIS — N182 Chronic kidney disease, stage 2 (mild): Secondary | ICD-10-CM | POA: Diagnosis not present

## 2018-09-17 DIAGNOSIS — N400 Enlarged prostate without lower urinary tract symptoms: Secondary | ICD-10-CM | POA: Diagnosis not present

## 2018-09-17 DIAGNOSIS — L89152 Pressure ulcer of sacral region, stage 2: Secondary | ICD-10-CM | POA: Diagnosis not present

## 2018-09-17 DIAGNOSIS — E1122 Type 2 diabetes mellitus with diabetic chronic kidney disease: Secondary | ICD-10-CM | POA: Diagnosis not present

## 2018-09-17 NOTE — Progress Notes (Signed)
Cystoscopy Procedure Note:  Indication: Stent removal s/p R URS,LL,stent on 09/10/2018 for 69mm proximal ureteral stone, pre-stented for infection  Patient incontinent of urine and unable to get urinalysis pre-stent removal. Has been on prophylactic amoxicillin and fluconazole.  After informed consent and discussion of the procedure and its risks, Matthew Bruce was positioned and prepped in the standard fashion. Cystoscopy was performed with a flexible cystoscope. The stent was grasped with flexible graspers and removed in its entirety. The patient tolerated the procedure well.  Findings: Uncomplicated stent removal  Assessment and Plan: Follow up as needed  Billey Co, MD 09/17/2018

## 2018-09-18 DIAGNOSIS — D539 Nutritional anemia, unspecified: Secondary | ICD-10-CM | POA: Diagnosis not present

## 2018-09-18 DIAGNOSIS — N400 Enlarged prostate without lower urinary tract symptoms: Secondary | ICD-10-CM | POA: Diagnosis not present

## 2018-09-18 DIAGNOSIS — M6281 Muscle weakness (generalized): Secondary | ICD-10-CM | POA: Diagnosis not present

## 2018-09-18 DIAGNOSIS — I129 Hypertensive chronic kidney disease with stage 1 through stage 4 chronic kidney disease, or unspecified chronic kidney disease: Secondary | ICD-10-CM | POA: Diagnosis not present

## 2018-09-18 DIAGNOSIS — I359 Nonrheumatic aortic valve disorder, unspecified: Secondary | ICD-10-CM | POA: Diagnosis not present

## 2018-09-18 DIAGNOSIS — N182 Chronic kidney disease, stage 2 (mild): Secondary | ICD-10-CM | POA: Diagnosis not present

## 2018-09-18 DIAGNOSIS — L89152 Pressure ulcer of sacral region, stage 2: Secondary | ICD-10-CM | POA: Diagnosis not present

## 2018-09-18 DIAGNOSIS — E1122 Type 2 diabetes mellitus with diabetic chronic kidney disease: Secondary | ICD-10-CM | POA: Diagnosis not present

## 2018-09-18 DIAGNOSIS — I2510 Atherosclerotic heart disease of native coronary artery without angina pectoris: Secondary | ICD-10-CM | POA: Diagnosis not present

## 2018-09-19 DIAGNOSIS — L89152 Pressure ulcer of sacral region, stage 2: Secondary | ICD-10-CM | POA: Diagnosis not present

## 2018-09-19 DIAGNOSIS — I129 Hypertensive chronic kidney disease with stage 1 through stage 4 chronic kidney disease, or unspecified chronic kidney disease: Secondary | ICD-10-CM | POA: Diagnosis not present

## 2018-09-19 DIAGNOSIS — I2510 Atherosclerotic heart disease of native coronary artery without angina pectoris: Secondary | ICD-10-CM | POA: Diagnosis not present

## 2018-09-19 DIAGNOSIS — D539 Nutritional anemia, unspecified: Secondary | ICD-10-CM | POA: Diagnosis not present

## 2018-09-19 DIAGNOSIS — N400 Enlarged prostate without lower urinary tract symptoms: Secondary | ICD-10-CM | POA: Diagnosis not present

## 2018-09-19 DIAGNOSIS — M6281 Muscle weakness (generalized): Secondary | ICD-10-CM | POA: Diagnosis not present

## 2018-09-19 DIAGNOSIS — I359 Nonrheumatic aortic valve disorder, unspecified: Secondary | ICD-10-CM | POA: Diagnosis not present

## 2018-09-19 DIAGNOSIS — N182 Chronic kidney disease, stage 2 (mild): Secondary | ICD-10-CM | POA: Diagnosis not present

## 2018-09-19 DIAGNOSIS — E1122 Type 2 diabetes mellitus with diabetic chronic kidney disease: Secondary | ICD-10-CM | POA: Diagnosis not present

## 2018-09-20 DIAGNOSIS — I2510 Atherosclerotic heart disease of native coronary artery without angina pectoris: Secondary | ICD-10-CM | POA: Diagnosis not present

## 2018-09-20 DIAGNOSIS — D539 Nutritional anemia, unspecified: Secondary | ICD-10-CM | POA: Diagnosis not present

## 2018-09-20 DIAGNOSIS — I359 Nonrheumatic aortic valve disorder, unspecified: Secondary | ICD-10-CM | POA: Diagnosis not present

## 2018-09-20 DIAGNOSIS — N400 Enlarged prostate without lower urinary tract symptoms: Secondary | ICD-10-CM | POA: Diagnosis not present

## 2018-09-20 DIAGNOSIS — L89152 Pressure ulcer of sacral region, stage 2: Secondary | ICD-10-CM | POA: Diagnosis not present

## 2018-09-20 DIAGNOSIS — E1122 Type 2 diabetes mellitus with diabetic chronic kidney disease: Secondary | ICD-10-CM | POA: Diagnosis not present

## 2018-09-20 DIAGNOSIS — N182 Chronic kidney disease, stage 2 (mild): Secondary | ICD-10-CM | POA: Diagnosis not present

## 2018-09-20 DIAGNOSIS — M6281 Muscle weakness (generalized): Secondary | ICD-10-CM | POA: Diagnosis not present

## 2018-09-20 DIAGNOSIS — I129 Hypertensive chronic kidney disease with stage 1 through stage 4 chronic kidney disease, or unspecified chronic kidney disease: Secondary | ICD-10-CM | POA: Diagnosis not present

## 2018-09-26 DIAGNOSIS — L89152 Pressure ulcer of sacral region, stage 2: Secondary | ICD-10-CM | POA: Diagnosis not present

## 2018-09-26 DIAGNOSIS — I359 Nonrheumatic aortic valve disorder, unspecified: Secondary | ICD-10-CM | POA: Diagnosis not present

## 2018-09-26 DIAGNOSIS — N182 Chronic kidney disease, stage 2 (mild): Secondary | ICD-10-CM | POA: Diagnosis not present

## 2018-09-26 DIAGNOSIS — I129 Hypertensive chronic kidney disease with stage 1 through stage 4 chronic kidney disease, or unspecified chronic kidney disease: Secondary | ICD-10-CM | POA: Diagnosis not present

## 2018-09-26 DIAGNOSIS — E1122 Type 2 diabetes mellitus with diabetic chronic kidney disease: Secondary | ICD-10-CM | POA: Diagnosis not present

## 2018-09-26 DIAGNOSIS — M6281 Muscle weakness (generalized): Secondary | ICD-10-CM | POA: Diagnosis not present

## 2018-09-26 DIAGNOSIS — N400 Enlarged prostate without lower urinary tract symptoms: Secondary | ICD-10-CM | POA: Diagnosis not present

## 2018-09-26 DIAGNOSIS — I2510 Atherosclerotic heart disease of native coronary artery without angina pectoris: Secondary | ICD-10-CM | POA: Diagnosis not present

## 2018-09-26 DIAGNOSIS — D539 Nutritional anemia, unspecified: Secondary | ICD-10-CM | POA: Diagnosis not present

## 2018-09-28 DIAGNOSIS — M6281 Muscle weakness (generalized): Secondary | ICD-10-CM | POA: Diagnosis not present

## 2018-09-28 DIAGNOSIS — N400 Enlarged prostate without lower urinary tract symptoms: Secondary | ICD-10-CM | POA: Diagnosis not present

## 2018-09-28 DIAGNOSIS — I359 Nonrheumatic aortic valve disorder, unspecified: Secondary | ICD-10-CM | POA: Diagnosis not present

## 2018-09-28 DIAGNOSIS — I129 Hypertensive chronic kidney disease with stage 1 through stage 4 chronic kidney disease, or unspecified chronic kidney disease: Secondary | ICD-10-CM | POA: Diagnosis not present

## 2018-09-28 DIAGNOSIS — E1122 Type 2 diabetes mellitus with diabetic chronic kidney disease: Secondary | ICD-10-CM | POA: Diagnosis not present

## 2018-09-28 DIAGNOSIS — I2510 Atherosclerotic heart disease of native coronary artery without angina pectoris: Secondary | ICD-10-CM | POA: Diagnosis not present

## 2018-09-28 DIAGNOSIS — L89152 Pressure ulcer of sacral region, stage 2: Secondary | ICD-10-CM | POA: Diagnosis not present

## 2018-09-28 DIAGNOSIS — D539 Nutritional anemia, unspecified: Secondary | ICD-10-CM | POA: Diagnosis not present

## 2018-09-28 DIAGNOSIS — N182 Chronic kidney disease, stage 2 (mild): Secondary | ICD-10-CM | POA: Diagnosis not present

## 2018-10-03 DIAGNOSIS — I359 Nonrheumatic aortic valve disorder, unspecified: Secondary | ICD-10-CM | POA: Diagnosis not present

## 2018-10-03 DIAGNOSIS — I2510 Atherosclerotic heart disease of native coronary artery without angina pectoris: Secondary | ICD-10-CM | POA: Diagnosis not present

## 2018-10-03 DIAGNOSIS — D539 Nutritional anemia, unspecified: Secondary | ICD-10-CM | POA: Diagnosis not present

## 2018-10-03 DIAGNOSIS — N182 Chronic kidney disease, stage 2 (mild): Secondary | ICD-10-CM | POA: Diagnosis not present

## 2018-10-03 DIAGNOSIS — M6281 Muscle weakness (generalized): Secondary | ICD-10-CM | POA: Diagnosis not present

## 2018-10-03 DIAGNOSIS — L89152 Pressure ulcer of sacral region, stage 2: Secondary | ICD-10-CM | POA: Diagnosis not present

## 2018-10-03 DIAGNOSIS — N400 Enlarged prostate without lower urinary tract symptoms: Secondary | ICD-10-CM | POA: Diagnosis not present

## 2018-10-03 DIAGNOSIS — I129 Hypertensive chronic kidney disease with stage 1 through stage 4 chronic kidney disease, or unspecified chronic kidney disease: Secondary | ICD-10-CM | POA: Diagnosis not present

## 2018-10-03 DIAGNOSIS — E1122 Type 2 diabetes mellitus with diabetic chronic kidney disease: Secondary | ICD-10-CM | POA: Diagnosis not present

## 2018-10-04 DIAGNOSIS — M6281 Muscle weakness (generalized): Secondary | ICD-10-CM | POA: Diagnosis not present

## 2018-10-04 DIAGNOSIS — I2510 Atherosclerotic heart disease of native coronary artery without angina pectoris: Secondary | ICD-10-CM | POA: Diagnosis not present

## 2018-10-04 DIAGNOSIS — N182 Chronic kidney disease, stage 2 (mild): Secondary | ICD-10-CM | POA: Diagnosis not present

## 2018-10-04 DIAGNOSIS — I129 Hypertensive chronic kidney disease with stage 1 through stage 4 chronic kidney disease, or unspecified chronic kidney disease: Secondary | ICD-10-CM | POA: Diagnosis not present

## 2018-10-04 DIAGNOSIS — D539 Nutritional anemia, unspecified: Secondary | ICD-10-CM | POA: Diagnosis not present

## 2018-10-04 DIAGNOSIS — L89152 Pressure ulcer of sacral region, stage 2: Secondary | ICD-10-CM | POA: Diagnosis not present

## 2018-10-04 DIAGNOSIS — N400 Enlarged prostate without lower urinary tract symptoms: Secondary | ICD-10-CM | POA: Diagnosis not present

## 2018-10-04 DIAGNOSIS — I359 Nonrheumatic aortic valve disorder, unspecified: Secondary | ICD-10-CM | POA: Diagnosis not present

## 2018-10-04 DIAGNOSIS — E1122 Type 2 diabetes mellitus with diabetic chronic kidney disease: Secondary | ICD-10-CM | POA: Diagnosis not present

## 2018-10-10 DIAGNOSIS — I359 Nonrheumatic aortic valve disorder, unspecified: Secondary | ICD-10-CM | POA: Diagnosis not present

## 2018-10-10 DIAGNOSIS — D539 Nutritional anemia, unspecified: Secondary | ICD-10-CM | POA: Diagnosis not present

## 2018-10-10 DIAGNOSIS — N400 Enlarged prostate without lower urinary tract symptoms: Secondary | ICD-10-CM | POA: Diagnosis not present

## 2018-10-10 DIAGNOSIS — E1122 Type 2 diabetes mellitus with diabetic chronic kidney disease: Secondary | ICD-10-CM | POA: Diagnosis not present

## 2018-10-10 DIAGNOSIS — I2510 Atherosclerotic heart disease of native coronary artery without angina pectoris: Secondary | ICD-10-CM | POA: Diagnosis not present

## 2018-10-10 DIAGNOSIS — L89152 Pressure ulcer of sacral region, stage 2: Secondary | ICD-10-CM | POA: Diagnosis not present

## 2018-10-10 DIAGNOSIS — M6281 Muscle weakness (generalized): Secondary | ICD-10-CM | POA: Diagnosis not present

## 2018-10-10 DIAGNOSIS — N182 Chronic kidney disease, stage 2 (mild): Secondary | ICD-10-CM | POA: Diagnosis not present

## 2018-10-10 DIAGNOSIS — I129 Hypertensive chronic kidney disease with stage 1 through stage 4 chronic kidney disease, or unspecified chronic kidney disease: Secondary | ICD-10-CM | POA: Diagnosis not present

## 2018-10-17 DIAGNOSIS — N182 Chronic kidney disease, stage 2 (mild): Secondary | ICD-10-CM | POA: Diagnosis not present

## 2018-10-17 DIAGNOSIS — N400 Enlarged prostate without lower urinary tract symptoms: Secondary | ICD-10-CM | POA: Diagnosis not present

## 2018-10-17 DIAGNOSIS — D539 Nutritional anemia, unspecified: Secondary | ICD-10-CM | POA: Diagnosis not present

## 2018-10-17 DIAGNOSIS — E1122 Type 2 diabetes mellitus with diabetic chronic kidney disease: Secondary | ICD-10-CM | POA: Diagnosis not present

## 2018-10-17 DIAGNOSIS — I2510 Atherosclerotic heart disease of native coronary artery without angina pectoris: Secondary | ICD-10-CM | POA: Diagnosis not present

## 2018-10-17 DIAGNOSIS — M6281 Muscle weakness (generalized): Secondary | ICD-10-CM | POA: Diagnosis not present

## 2018-10-17 DIAGNOSIS — I129 Hypertensive chronic kidney disease with stage 1 through stage 4 chronic kidney disease, or unspecified chronic kidney disease: Secondary | ICD-10-CM | POA: Diagnosis not present

## 2018-10-17 DIAGNOSIS — L89152 Pressure ulcer of sacral region, stage 2: Secondary | ICD-10-CM | POA: Diagnosis not present

## 2018-10-17 DIAGNOSIS — I359 Nonrheumatic aortic valve disorder, unspecified: Secondary | ICD-10-CM | POA: Diagnosis not present

## 2018-11-21 DIAGNOSIS — N400 Enlarged prostate without lower urinary tract symptoms: Secondary | ICD-10-CM | POA: Diagnosis not present

## 2018-11-21 DIAGNOSIS — R569 Unspecified convulsions: Secondary | ICD-10-CM | POA: Diagnosis not present

## 2018-11-21 DIAGNOSIS — I129 Hypertensive chronic kidney disease with stage 1 through stage 4 chronic kidney disease, or unspecified chronic kidney disease: Secondary | ICD-10-CM | POA: Diagnosis not present

## 2018-11-21 DIAGNOSIS — E11649 Type 2 diabetes mellitus with hypoglycemia without coma: Secondary | ICD-10-CM | POA: Diagnosis not present

## 2018-11-21 DIAGNOSIS — E1122 Type 2 diabetes mellitus with diabetic chronic kidney disease: Secondary | ICD-10-CM | POA: Diagnosis not present

## 2018-11-21 DIAGNOSIS — L89152 Pressure ulcer of sacral region, stage 2: Secondary | ICD-10-CM | POA: Diagnosis not present

## 2018-11-21 DIAGNOSIS — N182 Chronic kidney disease, stage 2 (mild): Secondary | ICD-10-CM | POA: Diagnosis not present

## 2018-11-21 DIAGNOSIS — I2510 Atherosclerotic heart disease of native coronary artery without angina pectoris: Secondary | ICD-10-CM | POA: Diagnosis not present

## 2018-11-21 DIAGNOSIS — D631 Anemia in chronic kidney disease: Secondary | ICD-10-CM | POA: Diagnosis not present

## 2018-11-21 DIAGNOSIS — L89312 Pressure ulcer of right buttock, stage 2: Secondary | ICD-10-CM | POA: Diagnosis not present

## 2018-11-23 DIAGNOSIS — D631 Anemia in chronic kidney disease: Secondary | ICD-10-CM | POA: Diagnosis not present

## 2018-11-23 DIAGNOSIS — R569 Unspecified convulsions: Secondary | ICD-10-CM | POA: Diagnosis not present

## 2018-11-23 DIAGNOSIS — N182 Chronic kidney disease, stage 2 (mild): Secondary | ICD-10-CM | POA: Diagnosis not present

## 2018-11-23 DIAGNOSIS — E1122 Type 2 diabetes mellitus with diabetic chronic kidney disease: Secondary | ICD-10-CM | POA: Diagnosis not present

## 2018-11-23 DIAGNOSIS — I2510 Atherosclerotic heart disease of native coronary artery without angina pectoris: Secondary | ICD-10-CM | POA: Diagnosis not present

## 2018-11-23 DIAGNOSIS — L89312 Pressure ulcer of right buttock, stage 2: Secondary | ICD-10-CM | POA: Diagnosis not present

## 2018-11-23 DIAGNOSIS — N400 Enlarged prostate without lower urinary tract symptoms: Secondary | ICD-10-CM | POA: Diagnosis not present

## 2018-11-23 DIAGNOSIS — L89152 Pressure ulcer of sacral region, stage 2: Secondary | ICD-10-CM | POA: Diagnosis not present

## 2018-11-23 DIAGNOSIS — I129 Hypertensive chronic kidney disease with stage 1 through stage 4 chronic kidney disease, or unspecified chronic kidney disease: Secondary | ICD-10-CM | POA: Diagnosis not present

## 2018-11-26 DIAGNOSIS — I10 Essential (primary) hypertension: Secondary | ICD-10-CM | POA: Diagnosis not present

## 2018-11-29 DIAGNOSIS — N400 Enlarged prostate without lower urinary tract symptoms: Secondary | ICD-10-CM | POA: Diagnosis not present

## 2018-11-29 DIAGNOSIS — I2510 Atherosclerotic heart disease of native coronary artery without angina pectoris: Secondary | ICD-10-CM | POA: Diagnosis not present

## 2018-11-29 DIAGNOSIS — L89152 Pressure ulcer of sacral region, stage 2: Secondary | ICD-10-CM | POA: Diagnosis not present

## 2018-11-29 DIAGNOSIS — I129 Hypertensive chronic kidney disease with stage 1 through stage 4 chronic kidney disease, or unspecified chronic kidney disease: Secondary | ICD-10-CM | POA: Diagnosis not present

## 2018-11-29 DIAGNOSIS — R569 Unspecified convulsions: Secondary | ICD-10-CM | POA: Diagnosis not present

## 2018-11-29 DIAGNOSIS — E1122 Type 2 diabetes mellitus with diabetic chronic kidney disease: Secondary | ICD-10-CM | POA: Diagnosis not present

## 2018-11-29 DIAGNOSIS — N182 Chronic kidney disease, stage 2 (mild): Secondary | ICD-10-CM | POA: Diagnosis not present

## 2018-11-29 DIAGNOSIS — D631 Anemia in chronic kidney disease: Secondary | ICD-10-CM | POA: Diagnosis not present

## 2018-11-29 DIAGNOSIS — L89312 Pressure ulcer of right buttock, stage 2: Secondary | ICD-10-CM | POA: Diagnosis not present

## 2018-12-04 DIAGNOSIS — I2510 Atherosclerotic heart disease of native coronary artery without angina pectoris: Secondary | ICD-10-CM | POA: Diagnosis not present

## 2018-12-04 DIAGNOSIS — E1122 Type 2 diabetes mellitus with diabetic chronic kidney disease: Secondary | ICD-10-CM | POA: Diagnosis not present

## 2018-12-04 DIAGNOSIS — D631 Anemia in chronic kidney disease: Secondary | ICD-10-CM | POA: Diagnosis not present

## 2018-12-04 DIAGNOSIS — R569 Unspecified convulsions: Secondary | ICD-10-CM | POA: Diagnosis not present

## 2018-12-04 DIAGNOSIS — L89152 Pressure ulcer of sacral region, stage 2: Secondary | ICD-10-CM | POA: Diagnosis not present

## 2018-12-04 DIAGNOSIS — N400 Enlarged prostate without lower urinary tract symptoms: Secondary | ICD-10-CM | POA: Diagnosis not present

## 2018-12-04 DIAGNOSIS — I129 Hypertensive chronic kidney disease with stage 1 through stage 4 chronic kidney disease, or unspecified chronic kidney disease: Secondary | ICD-10-CM | POA: Diagnosis not present

## 2018-12-04 DIAGNOSIS — L89312 Pressure ulcer of right buttock, stage 2: Secondary | ICD-10-CM | POA: Diagnosis not present

## 2018-12-04 DIAGNOSIS — N182 Chronic kidney disease, stage 2 (mild): Secondary | ICD-10-CM | POA: Diagnosis not present

## 2018-12-06 DIAGNOSIS — D631 Anemia in chronic kidney disease: Secondary | ICD-10-CM | POA: Diagnosis not present

## 2018-12-06 DIAGNOSIS — N182 Chronic kidney disease, stage 2 (mild): Secondary | ICD-10-CM | POA: Diagnosis not present

## 2018-12-06 DIAGNOSIS — L89312 Pressure ulcer of right buttock, stage 2: Secondary | ICD-10-CM | POA: Diagnosis not present

## 2018-12-06 DIAGNOSIS — L89152 Pressure ulcer of sacral region, stage 2: Secondary | ICD-10-CM | POA: Diagnosis not present

## 2018-12-06 DIAGNOSIS — I2510 Atherosclerotic heart disease of native coronary artery without angina pectoris: Secondary | ICD-10-CM | POA: Diagnosis not present

## 2018-12-06 DIAGNOSIS — I129 Hypertensive chronic kidney disease with stage 1 through stage 4 chronic kidney disease, or unspecified chronic kidney disease: Secondary | ICD-10-CM | POA: Diagnosis not present

## 2018-12-06 DIAGNOSIS — R569 Unspecified convulsions: Secondary | ICD-10-CM | POA: Diagnosis not present

## 2018-12-06 DIAGNOSIS — N400 Enlarged prostate without lower urinary tract symptoms: Secondary | ICD-10-CM | POA: Diagnosis not present

## 2018-12-06 DIAGNOSIS — E1122 Type 2 diabetes mellitus with diabetic chronic kidney disease: Secondary | ICD-10-CM | POA: Diagnosis not present

## 2018-12-07 DIAGNOSIS — I2510 Atherosclerotic heart disease of native coronary artery without angina pectoris: Secondary | ICD-10-CM | POA: Diagnosis not present

## 2018-12-07 DIAGNOSIS — L89312 Pressure ulcer of right buttock, stage 2: Secondary | ICD-10-CM | POA: Diagnosis not present

## 2018-12-07 DIAGNOSIS — L89152 Pressure ulcer of sacral region, stage 2: Secondary | ICD-10-CM | POA: Diagnosis not present

## 2018-12-07 DIAGNOSIS — E1122 Type 2 diabetes mellitus with diabetic chronic kidney disease: Secondary | ICD-10-CM | POA: Diagnosis not present

## 2018-12-07 DIAGNOSIS — R569 Unspecified convulsions: Secondary | ICD-10-CM | POA: Diagnosis not present

## 2018-12-07 DIAGNOSIS — N182 Chronic kidney disease, stage 2 (mild): Secondary | ICD-10-CM | POA: Diagnosis not present

## 2018-12-07 DIAGNOSIS — D631 Anemia in chronic kidney disease: Secondary | ICD-10-CM | POA: Diagnosis not present

## 2018-12-07 DIAGNOSIS — I129 Hypertensive chronic kidney disease with stage 1 through stage 4 chronic kidney disease, or unspecified chronic kidney disease: Secondary | ICD-10-CM | POA: Diagnosis not present

## 2018-12-07 DIAGNOSIS — N400 Enlarged prostate without lower urinary tract symptoms: Secondary | ICD-10-CM | POA: Diagnosis not present

## 2018-12-11 DIAGNOSIS — N400 Enlarged prostate without lower urinary tract symptoms: Secondary | ICD-10-CM | POA: Diagnosis not present

## 2018-12-11 DIAGNOSIS — I2510 Atherosclerotic heart disease of native coronary artery without angina pectoris: Secondary | ICD-10-CM | POA: Diagnosis not present

## 2018-12-11 DIAGNOSIS — N182 Chronic kidney disease, stage 2 (mild): Secondary | ICD-10-CM | POA: Diagnosis not present

## 2018-12-11 DIAGNOSIS — L89152 Pressure ulcer of sacral region, stage 2: Secondary | ICD-10-CM | POA: Diagnosis not present

## 2018-12-11 DIAGNOSIS — D631 Anemia in chronic kidney disease: Secondary | ICD-10-CM | POA: Diagnosis not present

## 2018-12-11 DIAGNOSIS — L89312 Pressure ulcer of right buttock, stage 2: Secondary | ICD-10-CM | POA: Diagnosis not present

## 2018-12-11 DIAGNOSIS — R569 Unspecified convulsions: Secondary | ICD-10-CM | POA: Diagnosis not present

## 2018-12-11 DIAGNOSIS — I129 Hypertensive chronic kidney disease with stage 1 through stage 4 chronic kidney disease, or unspecified chronic kidney disease: Secondary | ICD-10-CM | POA: Diagnosis not present

## 2018-12-11 DIAGNOSIS — E1122 Type 2 diabetes mellitus with diabetic chronic kidney disease: Secondary | ICD-10-CM | POA: Diagnosis not present

## 2018-12-13 DIAGNOSIS — I129 Hypertensive chronic kidney disease with stage 1 through stage 4 chronic kidney disease, or unspecified chronic kidney disease: Secondary | ICD-10-CM | POA: Diagnosis not present

## 2018-12-13 DIAGNOSIS — E1122 Type 2 diabetes mellitus with diabetic chronic kidney disease: Secondary | ICD-10-CM | POA: Diagnosis not present

## 2018-12-13 DIAGNOSIS — L89312 Pressure ulcer of right buttock, stage 2: Secondary | ICD-10-CM | POA: Diagnosis not present

## 2018-12-13 DIAGNOSIS — D631 Anemia in chronic kidney disease: Secondary | ICD-10-CM | POA: Diagnosis not present

## 2018-12-13 DIAGNOSIS — I2510 Atherosclerotic heart disease of native coronary artery without angina pectoris: Secondary | ICD-10-CM | POA: Diagnosis not present

## 2018-12-13 DIAGNOSIS — R569 Unspecified convulsions: Secondary | ICD-10-CM | POA: Diagnosis not present

## 2018-12-13 DIAGNOSIS — N400 Enlarged prostate without lower urinary tract symptoms: Secondary | ICD-10-CM | POA: Diagnosis not present

## 2018-12-13 DIAGNOSIS — N182 Chronic kidney disease, stage 2 (mild): Secondary | ICD-10-CM | POA: Diagnosis not present

## 2018-12-13 DIAGNOSIS — L89152 Pressure ulcer of sacral region, stage 2: Secondary | ICD-10-CM | POA: Diagnosis not present

## 2018-12-14 DIAGNOSIS — I129 Hypertensive chronic kidney disease with stage 1 through stage 4 chronic kidney disease, or unspecified chronic kidney disease: Secondary | ICD-10-CM | POA: Diagnosis not present

## 2018-12-14 DIAGNOSIS — L89312 Pressure ulcer of right buttock, stage 2: Secondary | ICD-10-CM | POA: Diagnosis not present

## 2018-12-14 DIAGNOSIS — N182 Chronic kidney disease, stage 2 (mild): Secondary | ICD-10-CM | POA: Diagnosis not present

## 2018-12-14 DIAGNOSIS — L89152 Pressure ulcer of sacral region, stage 2: Secondary | ICD-10-CM | POA: Diagnosis not present

## 2018-12-14 DIAGNOSIS — R569 Unspecified convulsions: Secondary | ICD-10-CM | POA: Diagnosis not present

## 2018-12-14 DIAGNOSIS — I2510 Atherosclerotic heart disease of native coronary artery without angina pectoris: Secondary | ICD-10-CM | POA: Diagnosis not present

## 2018-12-14 DIAGNOSIS — E1122 Type 2 diabetes mellitus with diabetic chronic kidney disease: Secondary | ICD-10-CM | POA: Diagnosis not present

## 2018-12-14 DIAGNOSIS — N400 Enlarged prostate without lower urinary tract symptoms: Secondary | ICD-10-CM | POA: Diagnosis not present

## 2018-12-14 DIAGNOSIS — D631 Anemia in chronic kidney disease: Secondary | ICD-10-CM | POA: Diagnosis not present

## 2018-12-18 DIAGNOSIS — N182 Chronic kidney disease, stage 2 (mild): Secondary | ICD-10-CM | POA: Diagnosis not present

## 2018-12-18 DIAGNOSIS — N400 Enlarged prostate without lower urinary tract symptoms: Secondary | ICD-10-CM | POA: Diagnosis not present

## 2018-12-18 DIAGNOSIS — E1122 Type 2 diabetes mellitus with diabetic chronic kidney disease: Secondary | ICD-10-CM | POA: Diagnosis not present

## 2018-12-18 DIAGNOSIS — I129 Hypertensive chronic kidney disease with stage 1 through stage 4 chronic kidney disease, or unspecified chronic kidney disease: Secondary | ICD-10-CM | POA: Diagnosis not present

## 2018-12-18 DIAGNOSIS — R569 Unspecified convulsions: Secondary | ICD-10-CM | POA: Diagnosis not present

## 2018-12-18 DIAGNOSIS — D631 Anemia in chronic kidney disease: Secondary | ICD-10-CM | POA: Diagnosis not present

## 2018-12-18 DIAGNOSIS — I2510 Atherosclerotic heart disease of native coronary artery without angina pectoris: Secondary | ICD-10-CM | POA: Diagnosis not present

## 2018-12-18 DIAGNOSIS — L89152 Pressure ulcer of sacral region, stage 2: Secondary | ICD-10-CM | POA: Diagnosis not present

## 2018-12-18 DIAGNOSIS — L89312 Pressure ulcer of right buttock, stage 2: Secondary | ICD-10-CM | POA: Diagnosis not present

## 2018-12-21 DIAGNOSIS — L89152 Pressure ulcer of sacral region, stage 2: Secondary | ICD-10-CM | POA: Diagnosis not present

## 2018-12-21 DIAGNOSIS — N400 Enlarged prostate without lower urinary tract symptoms: Secondary | ICD-10-CM | POA: Diagnosis not present

## 2018-12-21 DIAGNOSIS — N182 Chronic kidney disease, stage 2 (mild): Secondary | ICD-10-CM | POA: Diagnosis not present

## 2018-12-21 DIAGNOSIS — D631 Anemia in chronic kidney disease: Secondary | ICD-10-CM | POA: Diagnosis not present

## 2018-12-21 DIAGNOSIS — L89312 Pressure ulcer of right buttock, stage 2: Secondary | ICD-10-CM | POA: Diagnosis not present

## 2018-12-21 DIAGNOSIS — I2510 Atherosclerotic heart disease of native coronary artery without angina pectoris: Secondary | ICD-10-CM | POA: Diagnosis not present

## 2018-12-21 DIAGNOSIS — I129 Hypertensive chronic kidney disease with stage 1 through stage 4 chronic kidney disease, or unspecified chronic kidney disease: Secondary | ICD-10-CM | POA: Diagnosis not present

## 2018-12-21 DIAGNOSIS — E1122 Type 2 diabetes mellitus with diabetic chronic kidney disease: Secondary | ICD-10-CM | POA: Diagnosis not present

## 2018-12-25 DIAGNOSIS — D631 Anemia in chronic kidney disease: Secondary | ICD-10-CM | POA: Diagnosis not present

## 2018-12-25 DIAGNOSIS — I129 Hypertensive chronic kidney disease with stage 1 through stage 4 chronic kidney disease, or unspecified chronic kidney disease: Secondary | ICD-10-CM | POA: Diagnosis not present

## 2018-12-25 DIAGNOSIS — E1122 Type 2 diabetes mellitus with diabetic chronic kidney disease: Secondary | ICD-10-CM | POA: Diagnosis not present

## 2018-12-25 DIAGNOSIS — N400 Enlarged prostate without lower urinary tract symptoms: Secondary | ICD-10-CM | POA: Diagnosis not present

## 2018-12-25 DIAGNOSIS — R569 Unspecified convulsions: Secondary | ICD-10-CM | POA: Diagnosis not present

## 2018-12-25 DIAGNOSIS — L89152 Pressure ulcer of sacral region, stage 2: Secondary | ICD-10-CM | POA: Diagnosis not present

## 2018-12-25 DIAGNOSIS — I2510 Atherosclerotic heart disease of native coronary artery without angina pectoris: Secondary | ICD-10-CM | POA: Diagnosis not present

## 2018-12-25 DIAGNOSIS — N182 Chronic kidney disease, stage 2 (mild): Secondary | ICD-10-CM | POA: Diagnosis not present

## 2018-12-25 DIAGNOSIS — L89312 Pressure ulcer of right buttock, stage 2: Secondary | ICD-10-CM | POA: Diagnosis not present

## 2018-12-27 DIAGNOSIS — N400 Enlarged prostate without lower urinary tract symptoms: Secondary | ICD-10-CM | POA: Diagnosis not present

## 2018-12-27 DIAGNOSIS — L89312 Pressure ulcer of right buttock, stage 2: Secondary | ICD-10-CM | POA: Diagnosis not present

## 2018-12-27 DIAGNOSIS — E1122 Type 2 diabetes mellitus with diabetic chronic kidney disease: Secondary | ICD-10-CM | POA: Diagnosis not present

## 2018-12-27 DIAGNOSIS — I2510 Atherosclerotic heart disease of native coronary artery without angina pectoris: Secondary | ICD-10-CM | POA: Diagnosis not present

## 2018-12-27 DIAGNOSIS — D631 Anemia in chronic kidney disease: Secondary | ICD-10-CM | POA: Diagnosis not present

## 2018-12-27 DIAGNOSIS — L89152 Pressure ulcer of sacral region, stage 2: Secondary | ICD-10-CM | POA: Diagnosis not present

## 2018-12-27 DIAGNOSIS — N182 Chronic kidney disease, stage 2 (mild): Secondary | ICD-10-CM | POA: Diagnosis not present

## 2018-12-27 DIAGNOSIS — R569 Unspecified convulsions: Secondary | ICD-10-CM | POA: Diagnosis not present

## 2018-12-27 DIAGNOSIS — I129 Hypertensive chronic kidney disease with stage 1 through stage 4 chronic kidney disease, or unspecified chronic kidney disease: Secondary | ICD-10-CM | POA: Diagnosis not present

## 2018-12-31 DIAGNOSIS — E1122 Type 2 diabetes mellitus with diabetic chronic kidney disease: Secondary | ICD-10-CM | POA: Diagnosis not present

## 2018-12-31 DIAGNOSIS — L89312 Pressure ulcer of right buttock, stage 2: Secondary | ICD-10-CM | POA: Diagnosis not present

## 2018-12-31 DIAGNOSIS — L89152 Pressure ulcer of sacral region, stage 2: Secondary | ICD-10-CM | POA: Diagnosis not present

## 2018-12-31 DIAGNOSIS — R569 Unspecified convulsions: Secondary | ICD-10-CM | POA: Diagnosis not present

## 2018-12-31 DIAGNOSIS — I129 Hypertensive chronic kidney disease with stage 1 through stage 4 chronic kidney disease, or unspecified chronic kidney disease: Secondary | ICD-10-CM | POA: Diagnosis not present

## 2018-12-31 DIAGNOSIS — N182 Chronic kidney disease, stage 2 (mild): Secondary | ICD-10-CM | POA: Diagnosis not present

## 2018-12-31 DIAGNOSIS — I2510 Atherosclerotic heart disease of native coronary artery without angina pectoris: Secondary | ICD-10-CM | POA: Diagnosis not present

## 2018-12-31 DIAGNOSIS — D631 Anemia in chronic kidney disease: Secondary | ICD-10-CM | POA: Diagnosis not present

## 2018-12-31 DIAGNOSIS — N400 Enlarged prostate without lower urinary tract symptoms: Secondary | ICD-10-CM | POA: Diagnosis not present

## 2019-01-04 DIAGNOSIS — L89312 Pressure ulcer of right buttock, stage 2: Secondary | ICD-10-CM | POA: Diagnosis not present

## 2019-01-04 DIAGNOSIS — I2510 Atherosclerotic heart disease of native coronary artery without angina pectoris: Secondary | ICD-10-CM | POA: Diagnosis not present

## 2019-01-04 DIAGNOSIS — I129 Hypertensive chronic kidney disease with stage 1 through stage 4 chronic kidney disease, or unspecified chronic kidney disease: Secondary | ICD-10-CM | POA: Diagnosis not present

## 2019-01-04 DIAGNOSIS — R569 Unspecified convulsions: Secondary | ICD-10-CM | POA: Diagnosis not present

## 2019-01-04 DIAGNOSIS — L89152 Pressure ulcer of sacral region, stage 2: Secondary | ICD-10-CM | POA: Diagnosis not present

## 2019-01-04 DIAGNOSIS — D631 Anemia in chronic kidney disease: Secondary | ICD-10-CM | POA: Diagnosis not present

## 2019-01-04 DIAGNOSIS — E1122 Type 2 diabetes mellitus with diabetic chronic kidney disease: Secondary | ICD-10-CM | POA: Diagnosis not present

## 2019-01-04 DIAGNOSIS — N400 Enlarged prostate without lower urinary tract symptoms: Secondary | ICD-10-CM | POA: Diagnosis not present

## 2019-01-04 DIAGNOSIS — N182 Chronic kidney disease, stage 2 (mild): Secondary | ICD-10-CM | POA: Diagnosis not present

## 2019-01-08 DIAGNOSIS — I129 Hypertensive chronic kidney disease with stage 1 through stage 4 chronic kidney disease, or unspecified chronic kidney disease: Secondary | ICD-10-CM | POA: Diagnosis not present

## 2019-01-08 DIAGNOSIS — L89312 Pressure ulcer of right buttock, stage 2: Secondary | ICD-10-CM | POA: Diagnosis not present

## 2019-01-08 DIAGNOSIS — D631 Anemia in chronic kidney disease: Secondary | ICD-10-CM | POA: Diagnosis not present

## 2019-01-08 DIAGNOSIS — L89152 Pressure ulcer of sacral region, stage 2: Secondary | ICD-10-CM | POA: Diagnosis not present

## 2019-01-08 DIAGNOSIS — I2510 Atherosclerotic heart disease of native coronary artery without angina pectoris: Secondary | ICD-10-CM | POA: Diagnosis not present

## 2019-01-08 DIAGNOSIS — N182 Chronic kidney disease, stage 2 (mild): Secondary | ICD-10-CM | POA: Diagnosis not present

## 2019-01-08 DIAGNOSIS — R569 Unspecified convulsions: Secondary | ICD-10-CM | POA: Diagnosis not present

## 2019-01-08 DIAGNOSIS — N400 Enlarged prostate without lower urinary tract symptoms: Secondary | ICD-10-CM | POA: Diagnosis not present

## 2019-01-08 DIAGNOSIS — E1122 Type 2 diabetes mellitus with diabetic chronic kidney disease: Secondary | ICD-10-CM | POA: Diagnosis not present

## 2019-01-10 DIAGNOSIS — L89152 Pressure ulcer of sacral region, stage 2: Secondary | ICD-10-CM | POA: Diagnosis not present

## 2019-01-10 DIAGNOSIS — L97529 Non-pressure chronic ulcer of other part of left foot with unspecified severity: Secondary | ICD-10-CM | POA: Diagnosis not present

## 2019-01-10 DIAGNOSIS — T8781 Dehiscence of amputation stump: Secondary | ICD-10-CM | POA: Diagnosis not present

## 2019-01-10 DIAGNOSIS — L89312 Pressure ulcer of right buttock, stage 2: Secondary | ICD-10-CM | POA: Diagnosis not present

## 2019-01-11 DIAGNOSIS — L89312 Pressure ulcer of right buttock, stage 2: Secondary | ICD-10-CM | POA: Diagnosis not present

## 2019-01-11 DIAGNOSIS — L89152 Pressure ulcer of sacral region, stage 2: Secondary | ICD-10-CM | POA: Diagnosis not present

## 2019-01-11 DIAGNOSIS — I2510 Atherosclerotic heart disease of native coronary artery without angina pectoris: Secondary | ICD-10-CM | POA: Diagnosis not present

## 2019-01-11 DIAGNOSIS — E1122 Type 2 diabetes mellitus with diabetic chronic kidney disease: Secondary | ICD-10-CM | POA: Diagnosis not present

## 2019-01-11 DIAGNOSIS — N400 Enlarged prostate without lower urinary tract symptoms: Secondary | ICD-10-CM | POA: Diagnosis not present

## 2019-01-11 DIAGNOSIS — D631 Anemia in chronic kidney disease: Secondary | ICD-10-CM | POA: Diagnosis not present

## 2019-01-11 DIAGNOSIS — I129 Hypertensive chronic kidney disease with stage 1 through stage 4 chronic kidney disease, or unspecified chronic kidney disease: Secondary | ICD-10-CM | POA: Diagnosis not present

## 2019-01-11 DIAGNOSIS — R569 Unspecified convulsions: Secondary | ICD-10-CM | POA: Diagnosis not present

## 2019-01-11 DIAGNOSIS — N182 Chronic kidney disease, stage 2 (mild): Secondary | ICD-10-CM | POA: Diagnosis not present

## 2019-01-15 DIAGNOSIS — I2510 Atherosclerotic heart disease of native coronary artery without angina pectoris: Secondary | ICD-10-CM | POA: Diagnosis not present

## 2019-01-15 DIAGNOSIS — D631 Anemia in chronic kidney disease: Secondary | ICD-10-CM | POA: Diagnosis not present

## 2019-01-15 DIAGNOSIS — R569 Unspecified convulsions: Secondary | ICD-10-CM | POA: Diagnosis not present

## 2019-01-15 DIAGNOSIS — E1122 Type 2 diabetes mellitus with diabetic chronic kidney disease: Secondary | ICD-10-CM | POA: Diagnosis not present

## 2019-01-15 DIAGNOSIS — I129 Hypertensive chronic kidney disease with stage 1 through stage 4 chronic kidney disease, or unspecified chronic kidney disease: Secondary | ICD-10-CM | POA: Diagnosis not present

## 2019-01-15 DIAGNOSIS — L89152 Pressure ulcer of sacral region, stage 2: Secondary | ICD-10-CM | POA: Diagnosis not present

## 2019-01-15 DIAGNOSIS — N400 Enlarged prostate without lower urinary tract symptoms: Secondary | ICD-10-CM | POA: Diagnosis not present

## 2019-01-15 DIAGNOSIS — L89312 Pressure ulcer of right buttock, stage 2: Secondary | ICD-10-CM | POA: Diagnosis not present

## 2019-01-15 DIAGNOSIS — N182 Chronic kidney disease, stage 2 (mild): Secondary | ICD-10-CM | POA: Diagnosis not present

## 2019-01-17 DIAGNOSIS — N182 Chronic kidney disease, stage 2 (mild): Secondary | ICD-10-CM | POA: Diagnosis not present

## 2019-01-17 DIAGNOSIS — I129 Hypertensive chronic kidney disease with stage 1 through stage 4 chronic kidney disease, or unspecified chronic kidney disease: Secondary | ICD-10-CM | POA: Diagnosis not present

## 2019-01-17 DIAGNOSIS — L89152 Pressure ulcer of sacral region, stage 2: Secondary | ICD-10-CM | POA: Diagnosis not present

## 2019-01-17 DIAGNOSIS — N400 Enlarged prostate without lower urinary tract symptoms: Secondary | ICD-10-CM | POA: Diagnosis not present

## 2019-01-17 DIAGNOSIS — E1122 Type 2 diabetes mellitus with diabetic chronic kidney disease: Secondary | ICD-10-CM | POA: Diagnosis not present

## 2019-01-17 DIAGNOSIS — I2510 Atherosclerotic heart disease of native coronary artery without angina pectoris: Secondary | ICD-10-CM | POA: Diagnosis not present

## 2019-01-17 DIAGNOSIS — L89312 Pressure ulcer of right buttock, stage 2: Secondary | ICD-10-CM | POA: Diagnosis not present

## 2019-01-17 DIAGNOSIS — R569 Unspecified convulsions: Secondary | ICD-10-CM | POA: Diagnosis not present

## 2019-01-17 DIAGNOSIS — D631 Anemia in chronic kidney disease: Secondary | ICD-10-CM | POA: Diagnosis not present

## 2019-01-22 DIAGNOSIS — E1122 Type 2 diabetes mellitus with diabetic chronic kidney disease: Secondary | ICD-10-CM | POA: Diagnosis not present

## 2019-01-22 DIAGNOSIS — N400 Enlarged prostate without lower urinary tract symptoms: Secondary | ICD-10-CM | POA: Diagnosis not present

## 2019-01-22 DIAGNOSIS — I129 Hypertensive chronic kidney disease with stage 1 through stage 4 chronic kidney disease, or unspecified chronic kidney disease: Secondary | ICD-10-CM | POA: Diagnosis not present

## 2019-01-22 DIAGNOSIS — L89312 Pressure ulcer of right buttock, stage 2: Secondary | ICD-10-CM | POA: Diagnosis not present

## 2019-01-22 DIAGNOSIS — I2510 Atherosclerotic heart disease of native coronary artery without angina pectoris: Secondary | ICD-10-CM | POA: Diagnosis not present

## 2019-01-22 DIAGNOSIS — N182 Chronic kidney disease, stage 2 (mild): Secondary | ICD-10-CM | POA: Diagnosis not present

## 2019-01-22 DIAGNOSIS — R569 Unspecified convulsions: Secondary | ICD-10-CM | POA: Diagnosis not present

## 2019-01-22 DIAGNOSIS — D631 Anemia in chronic kidney disease: Secondary | ICD-10-CM | POA: Diagnosis not present

## 2019-01-22 DIAGNOSIS — L89152 Pressure ulcer of sacral region, stage 2: Secondary | ICD-10-CM | POA: Diagnosis not present

## 2019-01-25 DIAGNOSIS — L89312 Pressure ulcer of right buttock, stage 2: Secondary | ICD-10-CM | POA: Diagnosis not present

## 2019-01-25 DIAGNOSIS — L89152 Pressure ulcer of sacral region, stage 2: Secondary | ICD-10-CM | POA: Diagnosis not present

## 2019-01-25 DIAGNOSIS — D631 Anemia in chronic kidney disease: Secondary | ICD-10-CM | POA: Diagnosis not present

## 2019-01-25 DIAGNOSIS — I129 Hypertensive chronic kidney disease with stage 1 through stage 4 chronic kidney disease, or unspecified chronic kidney disease: Secondary | ICD-10-CM | POA: Diagnosis not present

## 2019-01-25 DIAGNOSIS — I2510 Atherosclerotic heart disease of native coronary artery without angina pectoris: Secondary | ICD-10-CM | POA: Diagnosis not present

## 2019-01-25 DIAGNOSIS — E1122 Type 2 diabetes mellitus with diabetic chronic kidney disease: Secondary | ICD-10-CM | POA: Diagnosis not present

## 2019-01-25 DIAGNOSIS — R569 Unspecified convulsions: Secondary | ICD-10-CM | POA: Diagnosis not present

## 2019-01-25 DIAGNOSIS — N182 Chronic kidney disease, stage 2 (mild): Secondary | ICD-10-CM | POA: Diagnosis not present

## 2019-01-25 DIAGNOSIS — N400 Enlarged prostate without lower urinary tract symptoms: Secondary | ICD-10-CM | POA: Diagnosis not present

## 2019-01-28 DIAGNOSIS — L89152 Pressure ulcer of sacral region, stage 2: Secondary | ICD-10-CM | POA: Diagnosis not present

## 2019-01-28 DIAGNOSIS — L89312 Pressure ulcer of right buttock, stage 2: Secondary | ICD-10-CM | POA: Diagnosis not present

## 2019-01-28 DIAGNOSIS — N182 Chronic kidney disease, stage 2 (mild): Secondary | ICD-10-CM | POA: Diagnosis not present

## 2019-01-28 DIAGNOSIS — E1122 Type 2 diabetes mellitus with diabetic chronic kidney disease: Secondary | ICD-10-CM | POA: Diagnosis not present

## 2019-01-28 DIAGNOSIS — I129 Hypertensive chronic kidney disease with stage 1 through stage 4 chronic kidney disease, or unspecified chronic kidney disease: Secondary | ICD-10-CM | POA: Diagnosis not present

## 2019-01-28 DIAGNOSIS — D631 Anemia in chronic kidney disease: Secondary | ICD-10-CM | POA: Diagnosis not present

## 2019-01-28 DIAGNOSIS — I2510 Atherosclerotic heart disease of native coronary artery without angina pectoris: Secondary | ICD-10-CM | POA: Diagnosis not present

## 2019-01-28 DIAGNOSIS — N400 Enlarged prostate without lower urinary tract symptoms: Secondary | ICD-10-CM | POA: Diagnosis not present

## 2019-01-29 DIAGNOSIS — D631 Anemia in chronic kidney disease: Secondary | ICD-10-CM | POA: Diagnosis not present

## 2019-01-29 DIAGNOSIS — N400 Enlarged prostate without lower urinary tract symptoms: Secondary | ICD-10-CM | POA: Diagnosis not present

## 2019-01-29 DIAGNOSIS — I129 Hypertensive chronic kidney disease with stage 1 through stage 4 chronic kidney disease, or unspecified chronic kidney disease: Secondary | ICD-10-CM | POA: Diagnosis not present

## 2019-01-29 DIAGNOSIS — L89312 Pressure ulcer of right buttock, stage 2: Secondary | ICD-10-CM | POA: Diagnosis not present

## 2019-01-29 DIAGNOSIS — R569 Unspecified convulsions: Secondary | ICD-10-CM | POA: Diagnosis not present

## 2019-01-29 DIAGNOSIS — L89152 Pressure ulcer of sacral region, stage 2: Secondary | ICD-10-CM | POA: Diagnosis not present

## 2019-01-29 DIAGNOSIS — E1122 Type 2 diabetes mellitus with diabetic chronic kidney disease: Secondary | ICD-10-CM | POA: Diagnosis not present

## 2019-01-29 DIAGNOSIS — N182 Chronic kidney disease, stage 2 (mild): Secondary | ICD-10-CM | POA: Diagnosis not present

## 2019-01-29 DIAGNOSIS — I2510 Atherosclerotic heart disease of native coronary artery without angina pectoris: Secondary | ICD-10-CM | POA: Diagnosis not present

## 2019-02-05 DIAGNOSIS — R569 Unspecified convulsions: Secondary | ICD-10-CM | POA: Diagnosis not present

## 2019-02-05 DIAGNOSIS — L89312 Pressure ulcer of right buttock, stage 2: Secondary | ICD-10-CM | POA: Diagnosis not present

## 2019-02-05 DIAGNOSIS — I2510 Atherosclerotic heart disease of native coronary artery without angina pectoris: Secondary | ICD-10-CM | POA: Diagnosis not present

## 2019-02-05 DIAGNOSIS — N400 Enlarged prostate without lower urinary tract symptoms: Secondary | ICD-10-CM | POA: Diagnosis not present

## 2019-02-05 DIAGNOSIS — D631 Anemia in chronic kidney disease: Secondary | ICD-10-CM | POA: Diagnosis not present

## 2019-02-05 DIAGNOSIS — I129 Hypertensive chronic kidney disease with stage 1 through stage 4 chronic kidney disease, or unspecified chronic kidney disease: Secondary | ICD-10-CM | POA: Diagnosis not present

## 2019-02-05 DIAGNOSIS — N182 Chronic kidney disease, stage 2 (mild): Secondary | ICD-10-CM | POA: Diagnosis not present

## 2019-02-05 DIAGNOSIS — E1122 Type 2 diabetes mellitus with diabetic chronic kidney disease: Secondary | ICD-10-CM | POA: Diagnosis not present

## 2019-02-05 DIAGNOSIS — L89152 Pressure ulcer of sacral region, stage 2: Secondary | ICD-10-CM | POA: Diagnosis not present

## 2019-02-08 DIAGNOSIS — R569 Unspecified convulsions: Secondary | ICD-10-CM | POA: Diagnosis not present

## 2019-02-08 DIAGNOSIS — I2510 Atherosclerotic heart disease of native coronary artery without angina pectoris: Secondary | ICD-10-CM | POA: Diagnosis not present

## 2019-02-08 DIAGNOSIS — N400 Enlarged prostate without lower urinary tract symptoms: Secondary | ICD-10-CM | POA: Diagnosis not present

## 2019-02-08 DIAGNOSIS — L89312 Pressure ulcer of right buttock, stage 2: Secondary | ICD-10-CM | POA: Diagnosis not present

## 2019-02-08 DIAGNOSIS — L89152 Pressure ulcer of sacral region, stage 2: Secondary | ICD-10-CM | POA: Diagnosis not present

## 2019-02-08 DIAGNOSIS — D631 Anemia in chronic kidney disease: Secondary | ICD-10-CM | POA: Diagnosis not present

## 2019-02-08 DIAGNOSIS — N182 Chronic kidney disease, stage 2 (mild): Secondary | ICD-10-CM | POA: Diagnosis not present

## 2019-02-08 DIAGNOSIS — I129 Hypertensive chronic kidney disease with stage 1 through stage 4 chronic kidney disease, or unspecified chronic kidney disease: Secondary | ICD-10-CM | POA: Diagnosis not present

## 2019-02-08 DIAGNOSIS — E1122 Type 2 diabetes mellitus with diabetic chronic kidney disease: Secondary | ICD-10-CM | POA: Diagnosis not present

## 2019-02-12 DIAGNOSIS — I129 Hypertensive chronic kidney disease with stage 1 through stage 4 chronic kidney disease, or unspecified chronic kidney disease: Secondary | ICD-10-CM | POA: Diagnosis not present

## 2019-02-12 DIAGNOSIS — E1122 Type 2 diabetes mellitus with diabetic chronic kidney disease: Secondary | ICD-10-CM | POA: Diagnosis not present

## 2019-02-12 DIAGNOSIS — N400 Enlarged prostate without lower urinary tract symptoms: Secondary | ICD-10-CM | POA: Diagnosis not present

## 2019-02-12 DIAGNOSIS — L89312 Pressure ulcer of right buttock, stage 2: Secondary | ICD-10-CM | POA: Diagnosis not present

## 2019-02-12 DIAGNOSIS — L89152 Pressure ulcer of sacral region, stage 2: Secondary | ICD-10-CM | POA: Diagnosis not present

## 2019-02-12 DIAGNOSIS — D631 Anemia in chronic kidney disease: Secondary | ICD-10-CM | POA: Diagnosis not present

## 2019-02-12 DIAGNOSIS — N182 Chronic kidney disease, stage 2 (mild): Secondary | ICD-10-CM | POA: Diagnosis not present

## 2019-02-12 DIAGNOSIS — R569 Unspecified convulsions: Secondary | ICD-10-CM | POA: Diagnosis not present

## 2019-02-12 DIAGNOSIS — I2510 Atherosclerotic heart disease of native coronary artery without angina pectoris: Secondary | ICD-10-CM | POA: Diagnosis not present

## 2019-02-26 DIAGNOSIS — I129 Hypertensive chronic kidney disease with stage 1 through stage 4 chronic kidney disease, or unspecified chronic kidney disease: Secondary | ICD-10-CM | POA: Diagnosis not present

## 2019-02-26 DIAGNOSIS — L89152 Pressure ulcer of sacral region, stage 2: Secondary | ICD-10-CM | POA: Diagnosis not present

## 2019-02-26 DIAGNOSIS — N182 Chronic kidney disease, stage 2 (mild): Secondary | ICD-10-CM | POA: Diagnosis not present

## 2019-02-26 DIAGNOSIS — I2510 Atherosclerotic heart disease of native coronary artery without angina pectoris: Secondary | ICD-10-CM | POA: Diagnosis not present

## 2019-02-26 DIAGNOSIS — R569 Unspecified convulsions: Secondary | ICD-10-CM | POA: Diagnosis not present

## 2019-02-26 DIAGNOSIS — L89312 Pressure ulcer of right buttock, stage 2: Secondary | ICD-10-CM | POA: Diagnosis not present

## 2019-02-26 DIAGNOSIS — E1122 Type 2 diabetes mellitus with diabetic chronic kidney disease: Secondary | ICD-10-CM | POA: Diagnosis not present

## 2019-02-26 DIAGNOSIS — N400 Enlarged prostate without lower urinary tract symptoms: Secondary | ICD-10-CM | POA: Diagnosis not present

## 2019-02-26 DIAGNOSIS — D631 Anemia in chronic kidney disease: Secondary | ICD-10-CM | POA: Diagnosis not present

## 2019-03-05 DIAGNOSIS — R569 Unspecified convulsions: Secondary | ICD-10-CM | POA: Diagnosis not present

## 2019-03-05 DIAGNOSIS — N182 Chronic kidney disease, stage 2 (mild): Secondary | ICD-10-CM | POA: Diagnosis not present

## 2019-03-05 DIAGNOSIS — D631 Anemia in chronic kidney disease: Secondary | ICD-10-CM | POA: Diagnosis not present

## 2019-03-05 DIAGNOSIS — I129 Hypertensive chronic kidney disease with stage 1 through stage 4 chronic kidney disease, or unspecified chronic kidney disease: Secondary | ICD-10-CM | POA: Diagnosis not present

## 2019-03-05 DIAGNOSIS — L89312 Pressure ulcer of right buttock, stage 2: Secondary | ICD-10-CM | POA: Diagnosis not present

## 2019-03-05 DIAGNOSIS — L89152 Pressure ulcer of sacral region, stage 2: Secondary | ICD-10-CM | POA: Diagnosis not present

## 2019-03-05 DIAGNOSIS — I2510 Atherosclerotic heart disease of native coronary artery without angina pectoris: Secondary | ICD-10-CM | POA: Diagnosis not present

## 2019-03-05 DIAGNOSIS — E1122 Type 2 diabetes mellitus with diabetic chronic kidney disease: Secondary | ICD-10-CM | POA: Diagnosis not present

## 2019-03-05 DIAGNOSIS — N400 Enlarged prostate without lower urinary tract symptoms: Secondary | ICD-10-CM | POA: Diagnosis not present

## 2019-04-16 DIAGNOSIS — L89329 Pressure ulcer of left buttock, unspecified stage: Secondary | ICD-10-CM | POA: Diagnosis not present

## 2019-04-16 DIAGNOSIS — L89319 Pressure ulcer of right buttock, unspecified stage: Secondary | ICD-10-CM | POA: Diagnosis not present

## 2019-04-16 DIAGNOSIS — Z8673 Personal history of transient ischemic attack (TIA), and cerebral infarction without residual deficits: Secondary | ICD-10-CM | POA: Diagnosis not present

## 2019-04-23 DIAGNOSIS — L89322 Pressure ulcer of left buttock, stage 2: Secondary | ICD-10-CM | POA: Diagnosis not present

## 2019-04-23 DIAGNOSIS — E1151 Type 2 diabetes mellitus with diabetic peripheral angiopathy without gangrene: Secondary | ICD-10-CM | POA: Diagnosis not present

## 2019-04-23 DIAGNOSIS — N401 Enlarged prostate with lower urinary tract symptoms: Secondary | ICD-10-CM | POA: Diagnosis not present

## 2019-04-23 DIAGNOSIS — I2510 Atherosclerotic heart disease of native coronary artery without angina pectoris: Secondary | ICD-10-CM | POA: Diagnosis not present

## 2019-04-23 DIAGNOSIS — E1122 Type 2 diabetes mellitus with diabetic chronic kidney disease: Secondary | ICD-10-CM | POA: Diagnosis not present

## 2019-04-23 DIAGNOSIS — I129 Hypertensive chronic kidney disease with stage 1 through stage 4 chronic kidney disease, or unspecified chronic kidney disease: Secondary | ICD-10-CM | POA: Diagnosis not present

## 2019-04-23 DIAGNOSIS — L89312 Pressure ulcer of right buttock, stage 2: Secondary | ICD-10-CM | POA: Diagnosis not present

## 2019-04-23 DIAGNOSIS — N182 Chronic kidney disease, stage 2 (mild): Secondary | ICD-10-CM | POA: Diagnosis not present

## 2019-04-23 DIAGNOSIS — D631 Anemia in chronic kidney disease: Secondary | ICD-10-CM | POA: Diagnosis not present

## 2019-04-24 DIAGNOSIS — D631 Anemia in chronic kidney disease: Secondary | ICD-10-CM | POA: Diagnosis not present

## 2019-04-24 DIAGNOSIS — E1122 Type 2 diabetes mellitus with diabetic chronic kidney disease: Secondary | ICD-10-CM | POA: Diagnosis not present

## 2019-04-24 DIAGNOSIS — N401 Enlarged prostate with lower urinary tract symptoms: Secondary | ICD-10-CM | POA: Diagnosis not present

## 2019-04-24 DIAGNOSIS — I129 Hypertensive chronic kidney disease with stage 1 through stage 4 chronic kidney disease, or unspecified chronic kidney disease: Secondary | ICD-10-CM | POA: Diagnosis not present

## 2019-04-24 DIAGNOSIS — N182 Chronic kidney disease, stage 2 (mild): Secondary | ICD-10-CM | POA: Diagnosis not present

## 2019-04-24 DIAGNOSIS — I2510 Atherosclerotic heart disease of native coronary artery without angina pectoris: Secondary | ICD-10-CM | POA: Diagnosis not present

## 2019-04-24 DIAGNOSIS — L89312 Pressure ulcer of right buttock, stage 2: Secondary | ICD-10-CM | POA: Diagnosis not present

## 2019-04-24 DIAGNOSIS — L89322 Pressure ulcer of left buttock, stage 2: Secondary | ICD-10-CM | POA: Diagnosis not present

## 2019-04-24 DIAGNOSIS — E1151 Type 2 diabetes mellitus with diabetic peripheral angiopathy without gangrene: Secondary | ICD-10-CM | POA: Diagnosis not present

## 2019-04-26 DIAGNOSIS — D631 Anemia in chronic kidney disease: Secondary | ICD-10-CM | POA: Diagnosis not present

## 2019-04-26 DIAGNOSIS — E1151 Type 2 diabetes mellitus with diabetic peripheral angiopathy without gangrene: Secondary | ICD-10-CM | POA: Diagnosis not present

## 2019-04-26 DIAGNOSIS — N182 Chronic kidney disease, stage 2 (mild): Secondary | ICD-10-CM | POA: Diagnosis not present

## 2019-04-26 DIAGNOSIS — I2510 Atherosclerotic heart disease of native coronary artery without angina pectoris: Secondary | ICD-10-CM | POA: Diagnosis not present

## 2019-04-26 DIAGNOSIS — N401 Enlarged prostate with lower urinary tract symptoms: Secondary | ICD-10-CM | POA: Diagnosis not present

## 2019-04-26 DIAGNOSIS — L89312 Pressure ulcer of right buttock, stage 2: Secondary | ICD-10-CM | POA: Diagnosis not present

## 2019-04-26 DIAGNOSIS — L89322 Pressure ulcer of left buttock, stage 2: Secondary | ICD-10-CM | POA: Diagnosis not present

## 2019-04-26 DIAGNOSIS — I129 Hypertensive chronic kidney disease with stage 1 through stage 4 chronic kidney disease, or unspecified chronic kidney disease: Secondary | ICD-10-CM | POA: Diagnosis not present

## 2019-04-26 DIAGNOSIS — E1122 Type 2 diabetes mellitus with diabetic chronic kidney disease: Secondary | ICD-10-CM | POA: Diagnosis not present

## 2019-04-30 DIAGNOSIS — L89312 Pressure ulcer of right buttock, stage 2: Secondary | ICD-10-CM | POA: Diagnosis not present

## 2019-04-30 DIAGNOSIS — N401 Enlarged prostate with lower urinary tract symptoms: Secondary | ICD-10-CM | POA: Diagnosis not present

## 2019-04-30 DIAGNOSIS — D631 Anemia in chronic kidney disease: Secondary | ICD-10-CM | POA: Diagnosis not present

## 2019-04-30 DIAGNOSIS — N182 Chronic kidney disease, stage 2 (mild): Secondary | ICD-10-CM | POA: Diagnosis not present

## 2019-04-30 DIAGNOSIS — E1151 Type 2 diabetes mellitus with diabetic peripheral angiopathy without gangrene: Secondary | ICD-10-CM | POA: Diagnosis not present

## 2019-04-30 DIAGNOSIS — E1122 Type 2 diabetes mellitus with diabetic chronic kidney disease: Secondary | ICD-10-CM | POA: Diagnosis not present

## 2019-04-30 DIAGNOSIS — L89322 Pressure ulcer of left buttock, stage 2: Secondary | ICD-10-CM | POA: Diagnosis not present

## 2019-04-30 DIAGNOSIS — I129 Hypertensive chronic kidney disease with stage 1 through stage 4 chronic kidney disease, or unspecified chronic kidney disease: Secondary | ICD-10-CM | POA: Diagnosis not present

## 2019-04-30 DIAGNOSIS — I2510 Atherosclerotic heart disease of native coronary artery without angina pectoris: Secondary | ICD-10-CM | POA: Diagnosis not present

## 2019-05-01 DIAGNOSIS — L89322 Pressure ulcer of left buttock, stage 2: Secondary | ICD-10-CM | POA: Diagnosis not present

## 2019-05-01 DIAGNOSIS — I129 Hypertensive chronic kidney disease with stage 1 through stage 4 chronic kidney disease, or unspecified chronic kidney disease: Secondary | ICD-10-CM | POA: Diagnosis not present

## 2019-05-01 DIAGNOSIS — L89312 Pressure ulcer of right buttock, stage 2: Secondary | ICD-10-CM | POA: Diagnosis not present

## 2019-05-01 DIAGNOSIS — E1151 Type 2 diabetes mellitus with diabetic peripheral angiopathy without gangrene: Secondary | ICD-10-CM | POA: Diagnosis not present

## 2019-05-01 DIAGNOSIS — D631 Anemia in chronic kidney disease: Secondary | ICD-10-CM | POA: Diagnosis not present

## 2019-05-01 DIAGNOSIS — E1122 Type 2 diabetes mellitus with diabetic chronic kidney disease: Secondary | ICD-10-CM | POA: Diagnosis not present

## 2019-05-01 DIAGNOSIS — N401 Enlarged prostate with lower urinary tract symptoms: Secondary | ICD-10-CM | POA: Diagnosis not present

## 2019-05-01 DIAGNOSIS — N182 Chronic kidney disease, stage 2 (mild): Secondary | ICD-10-CM | POA: Diagnosis not present

## 2019-05-01 DIAGNOSIS — I2510 Atherosclerotic heart disease of native coronary artery without angina pectoris: Secondary | ICD-10-CM | POA: Diagnosis not present

## 2019-05-02 DIAGNOSIS — L89322 Pressure ulcer of left buttock, stage 2: Secondary | ICD-10-CM | POA: Diagnosis not present

## 2019-05-02 DIAGNOSIS — N401 Enlarged prostate with lower urinary tract symptoms: Secondary | ICD-10-CM | POA: Diagnosis not present

## 2019-05-02 DIAGNOSIS — N182 Chronic kidney disease, stage 2 (mild): Secondary | ICD-10-CM | POA: Diagnosis not present

## 2019-05-02 DIAGNOSIS — E1151 Type 2 diabetes mellitus with diabetic peripheral angiopathy without gangrene: Secondary | ICD-10-CM | POA: Diagnosis not present

## 2019-05-02 DIAGNOSIS — I2510 Atherosclerotic heart disease of native coronary artery without angina pectoris: Secondary | ICD-10-CM | POA: Diagnosis not present

## 2019-05-02 DIAGNOSIS — D631 Anemia in chronic kidney disease: Secondary | ICD-10-CM | POA: Diagnosis not present

## 2019-05-02 DIAGNOSIS — I129 Hypertensive chronic kidney disease with stage 1 through stage 4 chronic kidney disease, or unspecified chronic kidney disease: Secondary | ICD-10-CM | POA: Diagnosis not present

## 2019-05-02 DIAGNOSIS — L89312 Pressure ulcer of right buttock, stage 2: Secondary | ICD-10-CM | POA: Diagnosis not present

## 2019-05-02 DIAGNOSIS — E1122 Type 2 diabetes mellitus with diabetic chronic kidney disease: Secondary | ICD-10-CM | POA: Diagnosis not present

## 2019-05-03 DIAGNOSIS — E1151 Type 2 diabetes mellitus with diabetic peripheral angiopathy without gangrene: Secondary | ICD-10-CM | POA: Diagnosis not present

## 2019-05-03 DIAGNOSIS — L89312 Pressure ulcer of right buttock, stage 2: Secondary | ICD-10-CM | POA: Diagnosis not present

## 2019-05-03 DIAGNOSIS — N182 Chronic kidney disease, stage 2 (mild): Secondary | ICD-10-CM | POA: Diagnosis not present

## 2019-05-03 DIAGNOSIS — D631 Anemia in chronic kidney disease: Secondary | ICD-10-CM | POA: Diagnosis not present

## 2019-05-03 DIAGNOSIS — N401 Enlarged prostate with lower urinary tract symptoms: Secondary | ICD-10-CM | POA: Diagnosis not present

## 2019-05-03 DIAGNOSIS — I129 Hypertensive chronic kidney disease with stage 1 through stage 4 chronic kidney disease, or unspecified chronic kidney disease: Secondary | ICD-10-CM | POA: Diagnosis not present

## 2019-05-03 DIAGNOSIS — I2510 Atherosclerotic heart disease of native coronary artery without angina pectoris: Secondary | ICD-10-CM | POA: Diagnosis not present

## 2019-05-03 DIAGNOSIS — E1122 Type 2 diabetes mellitus with diabetic chronic kidney disease: Secondary | ICD-10-CM | POA: Diagnosis not present

## 2019-05-03 DIAGNOSIS — L89322 Pressure ulcer of left buttock, stage 2: Secondary | ICD-10-CM | POA: Diagnosis not present

## 2019-05-04 DIAGNOSIS — E1122 Type 2 diabetes mellitus with diabetic chronic kidney disease: Secondary | ICD-10-CM | POA: Diagnosis not present

## 2019-05-04 DIAGNOSIS — L89322 Pressure ulcer of left buttock, stage 2: Secondary | ICD-10-CM | POA: Diagnosis not present

## 2019-05-04 DIAGNOSIS — I129 Hypertensive chronic kidney disease with stage 1 through stage 4 chronic kidney disease, or unspecified chronic kidney disease: Secondary | ICD-10-CM | POA: Diagnosis not present

## 2019-05-04 DIAGNOSIS — D631 Anemia in chronic kidney disease: Secondary | ICD-10-CM | POA: Diagnosis not present

## 2019-05-04 DIAGNOSIS — N182 Chronic kidney disease, stage 2 (mild): Secondary | ICD-10-CM | POA: Diagnosis not present

## 2019-05-04 DIAGNOSIS — I2510 Atherosclerotic heart disease of native coronary artery without angina pectoris: Secondary | ICD-10-CM | POA: Diagnosis not present

## 2019-05-04 DIAGNOSIS — N401 Enlarged prostate with lower urinary tract symptoms: Secondary | ICD-10-CM | POA: Diagnosis not present

## 2019-05-04 DIAGNOSIS — E1151 Type 2 diabetes mellitus with diabetic peripheral angiopathy without gangrene: Secondary | ICD-10-CM | POA: Diagnosis not present

## 2019-05-04 DIAGNOSIS — L89312 Pressure ulcer of right buttock, stage 2: Secondary | ICD-10-CM | POA: Diagnosis not present

## 2019-05-06 DIAGNOSIS — E1122 Type 2 diabetes mellitus with diabetic chronic kidney disease: Secondary | ICD-10-CM | POA: Diagnosis not present

## 2019-05-06 DIAGNOSIS — L89322 Pressure ulcer of left buttock, stage 2: Secondary | ICD-10-CM | POA: Diagnosis not present

## 2019-05-06 DIAGNOSIS — I2510 Atherosclerotic heart disease of native coronary artery without angina pectoris: Secondary | ICD-10-CM | POA: Diagnosis not present

## 2019-05-06 DIAGNOSIS — N182 Chronic kidney disease, stage 2 (mild): Secondary | ICD-10-CM | POA: Diagnosis not present

## 2019-05-06 DIAGNOSIS — D631 Anemia in chronic kidney disease: Secondary | ICD-10-CM | POA: Diagnosis not present

## 2019-05-06 DIAGNOSIS — N401 Enlarged prostate with lower urinary tract symptoms: Secondary | ICD-10-CM | POA: Diagnosis not present

## 2019-05-06 DIAGNOSIS — I129 Hypertensive chronic kidney disease with stage 1 through stage 4 chronic kidney disease, or unspecified chronic kidney disease: Secondary | ICD-10-CM | POA: Diagnosis not present

## 2019-05-06 DIAGNOSIS — E1151 Type 2 diabetes mellitus with diabetic peripheral angiopathy without gangrene: Secondary | ICD-10-CM | POA: Diagnosis not present

## 2019-05-06 DIAGNOSIS — L89312 Pressure ulcer of right buttock, stage 2: Secondary | ICD-10-CM | POA: Diagnosis not present

## 2019-05-07 DIAGNOSIS — L89312 Pressure ulcer of right buttock, stage 2: Secondary | ICD-10-CM | POA: Diagnosis not present

## 2019-05-07 DIAGNOSIS — D631 Anemia in chronic kidney disease: Secondary | ICD-10-CM | POA: Diagnosis not present

## 2019-05-07 DIAGNOSIS — I2510 Atherosclerotic heart disease of native coronary artery without angina pectoris: Secondary | ICD-10-CM | POA: Diagnosis not present

## 2019-05-07 DIAGNOSIS — E1151 Type 2 diabetes mellitus with diabetic peripheral angiopathy without gangrene: Secondary | ICD-10-CM | POA: Diagnosis not present

## 2019-05-07 DIAGNOSIS — I129 Hypertensive chronic kidney disease with stage 1 through stage 4 chronic kidney disease, or unspecified chronic kidney disease: Secondary | ICD-10-CM | POA: Diagnosis not present

## 2019-05-07 DIAGNOSIS — E1122 Type 2 diabetes mellitus with diabetic chronic kidney disease: Secondary | ICD-10-CM | POA: Diagnosis not present

## 2019-05-07 DIAGNOSIS — N401 Enlarged prostate with lower urinary tract symptoms: Secondary | ICD-10-CM | POA: Diagnosis not present

## 2019-05-07 DIAGNOSIS — N182 Chronic kidney disease, stage 2 (mild): Secondary | ICD-10-CM | POA: Diagnosis not present

## 2019-05-07 DIAGNOSIS — L89322 Pressure ulcer of left buttock, stage 2: Secondary | ICD-10-CM | POA: Diagnosis not present

## 2019-05-08 DIAGNOSIS — L89322 Pressure ulcer of left buttock, stage 2: Secondary | ICD-10-CM | POA: Diagnosis not present

## 2019-05-08 DIAGNOSIS — E1151 Type 2 diabetes mellitus with diabetic peripheral angiopathy without gangrene: Secondary | ICD-10-CM | POA: Diagnosis not present

## 2019-05-08 DIAGNOSIS — I2510 Atherosclerotic heart disease of native coronary artery without angina pectoris: Secondary | ICD-10-CM | POA: Diagnosis not present

## 2019-05-08 DIAGNOSIS — E1122 Type 2 diabetes mellitus with diabetic chronic kidney disease: Secondary | ICD-10-CM | POA: Diagnosis not present

## 2019-05-08 DIAGNOSIS — N401 Enlarged prostate with lower urinary tract symptoms: Secondary | ICD-10-CM | POA: Diagnosis not present

## 2019-05-08 DIAGNOSIS — D631 Anemia in chronic kidney disease: Secondary | ICD-10-CM | POA: Diagnosis not present

## 2019-05-08 DIAGNOSIS — I129 Hypertensive chronic kidney disease with stage 1 through stage 4 chronic kidney disease, or unspecified chronic kidney disease: Secondary | ICD-10-CM | POA: Diagnosis not present

## 2019-05-08 DIAGNOSIS — L89312 Pressure ulcer of right buttock, stage 2: Secondary | ICD-10-CM | POA: Diagnosis not present

## 2019-05-08 DIAGNOSIS — N182 Chronic kidney disease, stage 2 (mild): Secondary | ICD-10-CM | POA: Diagnosis not present

## 2019-05-09 DIAGNOSIS — I2510 Atherosclerotic heart disease of native coronary artery without angina pectoris: Secondary | ICD-10-CM | POA: Diagnosis not present

## 2019-05-09 DIAGNOSIS — N401 Enlarged prostate with lower urinary tract symptoms: Secondary | ICD-10-CM | POA: Diagnosis not present

## 2019-05-09 DIAGNOSIS — E1151 Type 2 diabetes mellitus with diabetic peripheral angiopathy without gangrene: Secondary | ICD-10-CM | POA: Diagnosis not present

## 2019-05-09 DIAGNOSIS — I129 Hypertensive chronic kidney disease with stage 1 through stage 4 chronic kidney disease, or unspecified chronic kidney disease: Secondary | ICD-10-CM | POA: Diagnosis not present

## 2019-05-09 DIAGNOSIS — L89322 Pressure ulcer of left buttock, stage 2: Secondary | ICD-10-CM | POA: Diagnosis not present

## 2019-05-09 DIAGNOSIS — D631 Anemia in chronic kidney disease: Secondary | ICD-10-CM | POA: Diagnosis not present

## 2019-05-09 DIAGNOSIS — N182 Chronic kidney disease, stage 2 (mild): Secondary | ICD-10-CM | POA: Diagnosis not present

## 2019-05-09 DIAGNOSIS — L89312 Pressure ulcer of right buttock, stage 2: Secondary | ICD-10-CM | POA: Diagnosis not present

## 2019-05-09 DIAGNOSIS — E1122 Type 2 diabetes mellitus with diabetic chronic kidney disease: Secondary | ICD-10-CM | POA: Diagnosis not present

## 2019-05-10 DIAGNOSIS — E1122 Type 2 diabetes mellitus with diabetic chronic kidney disease: Secondary | ICD-10-CM | POA: Diagnosis not present

## 2019-05-10 DIAGNOSIS — D631 Anemia in chronic kidney disease: Secondary | ICD-10-CM | POA: Diagnosis not present

## 2019-05-10 DIAGNOSIS — I129 Hypertensive chronic kidney disease with stage 1 through stage 4 chronic kidney disease, or unspecified chronic kidney disease: Secondary | ICD-10-CM | POA: Diagnosis not present

## 2019-05-10 DIAGNOSIS — N182 Chronic kidney disease, stage 2 (mild): Secondary | ICD-10-CM | POA: Diagnosis not present

## 2019-05-10 DIAGNOSIS — L89322 Pressure ulcer of left buttock, stage 2: Secondary | ICD-10-CM | POA: Diagnosis not present

## 2019-05-10 DIAGNOSIS — L89312 Pressure ulcer of right buttock, stage 2: Secondary | ICD-10-CM | POA: Diagnosis not present

## 2019-05-10 DIAGNOSIS — E1151 Type 2 diabetes mellitus with diabetic peripheral angiopathy without gangrene: Secondary | ICD-10-CM | POA: Diagnosis not present

## 2019-05-10 DIAGNOSIS — N401 Enlarged prostate with lower urinary tract symptoms: Secondary | ICD-10-CM | POA: Diagnosis not present

## 2019-05-10 DIAGNOSIS — I2510 Atherosclerotic heart disease of native coronary artery without angina pectoris: Secondary | ICD-10-CM | POA: Diagnosis not present

## 2019-05-13 DIAGNOSIS — I2510 Atherosclerotic heart disease of native coronary artery without angina pectoris: Secondary | ICD-10-CM | POA: Diagnosis not present

## 2019-05-13 DIAGNOSIS — E1122 Type 2 diabetes mellitus with diabetic chronic kidney disease: Secondary | ICD-10-CM | POA: Diagnosis not present

## 2019-05-13 DIAGNOSIS — L89312 Pressure ulcer of right buttock, stage 2: Secondary | ICD-10-CM | POA: Diagnosis not present

## 2019-05-13 DIAGNOSIS — D631 Anemia in chronic kidney disease: Secondary | ICD-10-CM | POA: Diagnosis not present

## 2019-05-13 DIAGNOSIS — L89322 Pressure ulcer of left buttock, stage 2: Secondary | ICD-10-CM | POA: Diagnosis not present

## 2019-05-13 DIAGNOSIS — N401 Enlarged prostate with lower urinary tract symptoms: Secondary | ICD-10-CM | POA: Diagnosis not present

## 2019-05-13 DIAGNOSIS — N182 Chronic kidney disease, stage 2 (mild): Secondary | ICD-10-CM | POA: Diagnosis not present

## 2019-05-13 DIAGNOSIS — E1151 Type 2 diabetes mellitus with diabetic peripheral angiopathy without gangrene: Secondary | ICD-10-CM | POA: Diagnosis not present

## 2019-05-13 DIAGNOSIS — I129 Hypertensive chronic kidney disease with stage 1 through stage 4 chronic kidney disease, or unspecified chronic kidney disease: Secondary | ICD-10-CM | POA: Diagnosis not present

## 2019-05-14 DIAGNOSIS — L89322 Pressure ulcer of left buttock, stage 2: Secondary | ICD-10-CM | POA: Diagnosis not present

## 2019-05-14 DIAGNOSIS — L89312 Pressure ulcer of right buttock, stage 2: Secondary | ICD-10-CM | POA: Diagnosis not present

## 2019-05-14 DIAGNOSIS — D631 Anemia in chronic kidney disease: Secondary | ICD-10-CM | POA: Diagnosis not present

## 2019-05-14 DIAGNOSIS — N401 Enlarged prostate with lower urinary tract symptoms: Secondary | ICD-10-CM | POA: Diagnosis not present

## 2019-05-14 DIAGNOSIS — N182 Chronic kidney disease, stage 2 (mild): Secondary | ICD-10-CM | POA: Diagnosis not present

## 2019-05-14 DIAGNOSIS — E1151 Type 2 diabetes mellitus with diabetic peripheral angiopathy without gangrene: Secondary | ICD-10-CM | POA: Diagnosis not present

## 2019-05-14 DIAGNOSIS — E1122 Type 2 diabetes mellitus with diabetic chronic kidney disease: Secondary | ICD-10-CM | POA: Diagnosis not present

## 2019-05-14 DIAGNOSIS — I129 Hypertensive chronic kidney disease with stage 1 through stage 4 chronic kidney disease, or unspecified chronic kidney disease: Secondary | ICD-10-CM | POA: Diagnosis not present

## 2019-05-14 DIAGNOSIS — I2510 Atherosclerotic heart disease of native coronary artery without angina pectoris: Secondary | ICD-10-CM | POA: Diagnosis not present

## 2019-05-15 DIAGNOSIS — I2510 Atherosclerotic heart disease of native coronary artery without angina pectoris: Secondary | ICD-10-CM | POA: Diagnosis not present

## 2019-05-15 DIAGNOSIS — E1151 Type 2 diabetes mellitus with diabetic peripheral angiopathy without gangrene: Secondary | ICD-10-CM | POA: Diagnosis not present

## 2019-05-15 DIAGNOSIS — L89322 Pressure ulcer of left buttock, stage 2: Secondary | ICD-10-CM | POA: Diagnosis not present

## 2019-05-15 DIAGNOSIS — N182 Chronic kidney disease, stage 2 (mild): Secondary | ICD-10-CM | POA: Diagnosis not present

## 2019-05-15 DIAGNOSIS — I639 Cerebral infarction, unspecified: Secondary | ICD-10-CM | POA: Diagnosis not present

## 2019-05-15 DIAGNOSIS — E1122 Type 2 diabetes mellitus with diabetic chronic kidney disease: Secondary | ICD-10-CM | POA: Diagnosis not present

## 2019-05-15 DIAGNOSIS — L89312 Pressure ulcer of right buttock, stage 2: Secondary | ICD-10-CM | POA: Diagnosis not present

## 2019-05-15 DIAGNOSIS — I129 Hypertensive chronic kidney disease with stage 1 through stage 4 chronic kidney disease, or unspecified chronic kidney disease: Secondary | ICD-10-CM | POA: Diagnosis not present

## 2019-05-16 DIAGNOSIS — L89312 Pressure ulcer of right buttock, stage 2: Secondary | ICD-10-CM | POA: Diagnosis not present

## 2019-05-16 DIAGNOSIS — L89322 Pressure ulcer of left buttock, stage 2: Secondary | ICD-10-CM | POA: Diagnosis not present

## 2019-05-16 DIAGNOSIS — I2510 Atherosclerotic heart disease of native coronary artery without angina pectoris: Secondary | ICD-10-CM | POA: Diagnosis not present

## 2019-05-16 DIAGNOSIS — I129 Hypertensive chronic kidney disease with stage 1 through stage 4 chronic kidney disease, or unspecified chronic kidney disease: Secondary | ICD-10-CM | POA: Diagnosis not present

## 2019-05-16 DIAGNOSIS — N182 Chronic kidney disease, stage 2 (mild): Secondary | ICD-10-CM | POA: Diagnosis not present

## 2019-05-16 DIAGNOSIS — D631 Anemia in chronic kidney disease: Secondary | ICD-10-CM | POA: Diagnosis not present

## 2019-05-16 DIAGNOSIS — E1122 Type 2 diabetes mellitus with diabetic chronic kidney disease: Secondary | ICD-10-CM | POA: Diagnosis not present

## 2019-05-16 DIAGNOSIS — N401 Enlarged prostate with lower urinary tract symptoms: Secondary | ICD-10-CM | POA: Diagnosis not present

## 2019-05-16 DIAGNOSIS — E1151 Type 2 diabetes mellitus with diabetic peripheral angiopathy without gangrene: Secondary | ICD-10-CM | POA: Diagnosis not present

## 2019-05-17 DIAGNOSIS — L89322 Pressure ulcer of left buttock, stage 2: Secondary | ICD-10-CM | POA: Diagnosis not present

## 2019-05-17 DIAGNOSIS — L89312 Pressure ulcer of right buttock, stage 2: Secondary | ICD-10-CM | POA: Diagnosis not present

## 2019-05-17 DIAGNOSIS — I2510 Atherosclerotic heart disease of native coronary artery without angina pectoris: Secondary | ICD-10-CM | POA: Diagnosis not present

## 2019-05-17 DIAGNOSIS — D631 Anemia in chronic kidney disease: Secondary | ICD-10-CM | POA: Diagnosis not present

## 2019-05-17 DIAGNOSIS — N182 Chronic kidney disease, stage 2 (mild): Secondary | ICD-10-CM | POA: Diagnosis not present

## 2019-05-17 DIAGNOSIS — I129 Hypertensive chronic kidney disease with stage 1 through stage 4 chronic kidney disease, or unspecified chronic kidney disease: Secondary | ICD-10-CM | POA: Diagnosis not present

## 2019-05-17 DIAGNOSIS — E1122 Type 2 diabetes mellitus with diabetic chronic kidney disease: Secondary | ICD-10-CM | POA: Diagnosis not present

## 2019-05-17 DIAGNOSIS — N401 Enlarged prostate with lower urinary tract symptoms: Secondary | ICD-10-CM | POA: Diagnosis not present

## 2019-05-17 DIAGNOSIS — E1151 Type 2 diabetes mellitus with diabetic peripheral angiopathy without gangrene: Secondary | ICD-10-CM | POA: Diagnosis not present

## 2019-05-21 DIAGNOSIS — N182 Chronic kidney disease, stage 2 (mild): Secondary | ICD-10-CM | POA: Diagnosis not present

## 2019-05-21 DIAGNOSIS — I2510 Atherosclerotic heart disease of native coronary artery without angina pectoris: Secondary | ICD-10-CM | POA: Diagnosis not present

## 2019-05-21 DIAGNOSIS — I129 Hypertensive chronic kidney disease with stage 1 through stage 4 chronic kidney disease, or unspecified chronic kidney disease: Secondary | ICD-10-CM | POA: Diagnosis not present

## 2019-05-21 DIAGNOSIS — N401 Enlarged prostate with lower urinary tract symptoms: Secondary | ICD-10-CM | POA: Diagnosis not present

## 2019-05-21 DIAGNOSIS — E1151 Type 2 diabetes mellitus with diabetic peripheral angiopathy without gangrene: Secondary | ICD-10-CM | POA: Diagnosis not present

## 2019-05-21 DIAGNOSIS — E1122 Type 2 diabetes mellitus with diabetic chronic kidney disease: Secondary | ICD-10-CM | POA: Diagnosis not present

## 2019-05-21 DIAGNOSIS — D631 Anemia in chronic kidney disease: Secondary | ICD-10-CM | POA: Diagnosis not present

## 2019-05-21 DIAGNOSIS — L89312 Pressure ulcer of right buttock, stage 2: Secondary | ICD-10-CM | POA: Diagnosis not present

## 2019-05-21 DIAGNOSIS — L89322 Pressure ulcer of left buttock, stage 2: Secondary | ICD-10-CM | POA: Diagnosis not present

## 2019-05-24 DIAGNOSIS — I2510 Atherosclerotic heart disease of native coronary artery without angina pectoris: Secondary | ICD-10-CM | POA: Diagnosis not present

## 2019-05-24 DIAGNOSIS — D631 Anemia in chronic kidney disease: Secondary | ICD-10-CM | POA: Diagnosis not present

## 2019-05-24 DIAGNOSIS — L89322 Pressure ulcer of left buttock, stage 2: Secondary | ICD-10-CM | POA: Diagnosis not present

## 2019-05-24 DIAGNOSIS — I129 Hypertensive chronic kidney disease with stage 1 through stage 4 chronic kidney disease, or unspecified chronic kidney disease: Secondary | ICD-10-CM | POA: Diagnosis not present

## 2019-05-24 DIAGNOSIS — E1151 Type 2 diabetes mellitus with diabetic peripheral angiopathy without gangrene: Secondary | ICD-10-CM | POA: Diagnosis not present

## 2019-05-24 DIAGNOSIS — N401 Enlarged prostate with lower urinary tract symptoms: Secondary | ICD-10-CM | POA: Diagnosis not present

## 2019-05-24 DIAGNOSIS — E1122 Type 2 diabetes mellitus with diabetic chronic kidney disease: Secondary | ICD-10-CM | POA: Diagnosis not present

## 2019-05-24 DIAGNOSIS — N182 Chronic kidney disease, stage 2 (mild): Secondary | ICD-10-CM | POA: Diagnosis not present

## 2019-05-24 DIAGNOSIS — L89312 Pressure ulcer of right buttock, stage 2: Secondary | ICD-10-CM | POA: Diagnosis not present

## 2019-05-27 DIAGNOSIS — E1151 Type 2 diabetes mellitus with diabetic peripheral angiopathy without gangrene: Secondary | ICD-10-CM | POA: Diagnosis not present

## 2019-05-27 DIAGNOSIS — E1122 Type 2 diabetes mellitus with diabetic chronic kidney disease: Secondary | ICD-10-CM | POA: Diagnosis not present

## 2019-05-27 DIAGNOSIS — D631 Anemia in chronic kidney disease: Secondary | ICD-10-CM | POA: Diagnosis not present

## 2019-05-27 DIAGNOSIS — N182 Chronic kidney disease, stage 2 (mild): Secondary | ICD-10-CM | POA: Diagnosis not present

## 2019-05-27 DIAGNOSIS — I129 Hypertensive chronic kidney disease with stage 1 through stage 4 chronic kidney disease, or unspecified chronic kidney disease: Secondary | ICD-10-CM | POA: Diagnosis not present

## 2019-05-27 DIAGNOSIS — N401 Enlarged prostate with lower urinary tract symptoms: Secondary | ICD-10-CM | POA: Diagnosis not present

## 2019-05-27 DIAGNOSIS — I2510 Atherosclerotic heart disease of native coronary artery without angina pectoris: Secondary | ICD-10-CM | POA: Diagnosis not present

## 2019-05-27 DIAGNOSIS — L89312 Pressure ulcer of right buttock, stage 2: Secondary | ICD-10-CM | POA: Diagnosis not present

## 2019-05-27 DIAGNOSIS — L89322 Pressure ulcer of left buttock, stage 2: Secondary | ICD-10-CM | POA: Diagnosis not present

## 2019-05-28 DIAGNOSIS — L89322 Pressure ulcer of left buttock, stage 2: Secondary | ICD-10-CM | POA: Diagnosis not present

## 2019-05-28 DIAGNOSIS — E1122 Type 2 diabetes mellitus with diabetic chronic kidney disease: Secondary | ICD-10-CM | POA: Diagnosis not present

## 2019-05-28 DIAGNOSIS — I2510 Atherosclerotic heart disease of native coronary artery without angina pectoris: Secondary | ICD-10-CM | POA: Diagnosis not present

## 2019-05-28 DIAGNOSIS — N182 Chronic kidney disease, stage 2 (mild): Secondary | ICD-10-CM | POA: Diagnosis not present

## 2019-05-28 DIAGNOSIS — D631 Anemia in chronic kidney disease: Secondary | ICD-10-CM | POA: Diagnosis not present

## 2019-05-28 DIAGNOSIS — I129 Hypertensive chronic kidney disease with stage 1 through stage 4 chronic kidney disease, or unspecified chronic kidney disease: Secondary | ICD-10-CM | POA: Diagnosis not present

## 2019-05-28 DIAGNOSIS — N401 Enlarged prostate with lower urinary tract symptoms: Secondary | ICD-10-CM | POA: Diagnosis not present

## 2019-05-28 DIAGNOSIS — E1151 Type 2 diabetes mellitus with diabetic peripheral angiopathy without gangrene: Secondary | ICD-10-CM | POA: Diagnosis not present

## 2019-05-28 DIAGNOSIS — L89312 Pressure ulcer of right buttock, stage 2: Secondary | ICD-10-CM | POA: Diagnosis not present

## 2019-05-29 DIAGNOSIS — L89312 Pressure ulcer of right buttock, stage 2: Secondary | ICD-10-CM | POA: Diagnosis not present

## 2019-05-29 DIAGNOSIS — L89322 Pressure ulcer of left buttock, stage 2: Secondary | ICD-10-CM | POA: Diagnosis not present

## 2019-05-29 DIAGNOSIS — N401 Enlarged prostate with lower urinary tract symptoms: Secondary | ICD-10-CM | POA: Diagnosis not present

## 2019-05-29 DIAGNOSIS — I2510 Atherosclerotic heart disease of native coronary artery without angina pectoris: Secondary | ICD-10-CM | POA: Diagnosis not present

## 2019-05-29 DIAGNOSIS — E1151 Type 2 diabetes mellitus with diabetic peripheral angiopathy without gangrene: Secondary | ICD-10-CM | POA: Diagnosis not present

## 2019-05-29 DIAGNOSIS — D631 Anemia in chronic kidney disease: Secondary | ICD-10-CM | POA: Diagnosis not present

## 2019-05-29 DIAGNOSIS — I129 Hypertensive chronic kidney disease with stage 1 through stage 4 chronic kidney disease, or unspecified chronic kidney disease: Secondary | ICD-10-CM | POA: Diagnosis not present

## 2019-05-29 DIAGNOSIS — N182 Chronic kidney disease, stage 2 (mild): Secondary | ICD-10-CM | POA: Diagnosis not present

## 2019-05-29 DIAGNOSIS — E1122 Type 2 diabetes mellitus with diabetic chronic kidney disease: Secondary | ICD-10-CM | POA: Diagnosis not present

## 2019-05-30 DIAGNOSIS — I129 Hypertensive chronic kidney disease with stage 1 through stage 4 chronic kidney disease, or unspecified chronic kidney disease: Secondary | ICD-10-CM | POA: Diagnosis not present

## 2019-05-30 DIAGNOSIS — I2510 Atherosclerotic heart disease of native coronary artery without angina pectoris: Secondary | ICD-10-CM | POA: Diagnosis not present

## 2019-05-30 DIAGNOSIS — N182 Chronic kidney disease, stage 2 (mild): Secondary | ICD-10-CM | POA: Diagnosis not present

## 2019-05-30 DIAGNOSIS — N401 Enlarged prostate with lower urinary tract symptoms: Secondary | ICD-10-CM | POA: Diagnosis not present

## 2019-05-30 DIAGNOSIS — D631 Anemia in chronic kidney disease: Secondary | ICD-10-CM | POA: Diagnosis not present

## 2019-05-30 DIAGNOSIS — L89322 Pressure ulcer of left buttock, stage 2: Secondary | ICD-10-CM | POA: Diagnosis not present

## 2019-05-30 DIAGNOSIS — L89312 Pressure ulcer of right buttock, stage 2: Secondary | ICD-10-CM | POA: Diagnosis not present

## 2019-05-30 DIAGNOSIS — E1122 Type 2 diabetes mellitus with diabetic chronic kidney disease: Secondary | ICD-10-CM | POA: Diagnosis not present

## 2019-05-30 DIAGNOSIS — E1151 Type 2 diabetes mellitus with diabetic peripheral angiopathy without gangrene: Secondary | ICD-10-CM | POA: Diagnosis not present

## 2019-06-03 DIAGNOSIS — E1122 Type 2 diabetes mellitus with diabetic chronic kidney disease: Secondary | ICD-10-CM | POA: Diagnosis not present

## 2019-06-03 DIAGNOSIS — I2510 Atherosclerotic heart disease of native coronary artery without angina pectoris: Secondary | ICD-10-CM | POA: Diagnosis not present

## 2019-06-03 DIAGNOSIS — D631 Anemia in chronic kidney disease: Secondary | ICD-10-CM | POA: Diagnosis not present

## 2019-06-03 DIAGNOSIS — E1151 Type 2 diabetes mellitus with diabetic peripheral angiopathy without gangrene: Secondary | ICD-10-CM | POA: Diagnosis not present

## 2019-06-03 DIAGNOSIS — L89312 Pressure ulcer of right buttock, stage 2: Secondary | ICD-10-CM | POA: Diagnosis not present

## 2019-06-03 DIAGNOSIS — L89322 Pressure ulcer of left buttock, stage 2: Secondary | ICD-10-CM | POA: Diagnosis not present

## 2019-06-03 DIAGNOSIS — N401 Enlarged prostate with lower urinary tract symptoms: Secondary | ICD-10-CM | POA: Diagnosis not present

## 2019-06-03 DIAGNOSIS — N182 Chronic kidney disease, stage 2 (mild): Secondary | ICD-10-CM | POA: Diagnosis not present

## 2019-06-03 DIAGNOSIS — I129 Hypertensive chronic kidney disease with stage 1 through stage 4 chronic kidney disease, or unspecified chronic kidney disease: Secondary | ICD-10-CM | POA: Diagnosis not present

## 2019-06-06 DIAGNOSIS — I2510 Atherosclerotic heart disease of native coronary artery without angina pectoris: Secondary | ICD-10-CM | POA: Diagnosis not present

## 2019-06-06 DIAGNOSIS — E1151 Type 2 diabetes mellitus with diabetic peripheral angiopathy without gangrene: Secondary | ICD-10-CM | POA: Diagnosis not present

## 2019-06-06 DIAGNOSIS — E1122 Type 2 diabetes mellitus with diabetic chronic kidney disease: Secondary | ICD-10-CM | POA: Diagnosis not present

## 2019-06-06 DIAGNOSIS — I129 Hypertensive chronic kidney disease with stage 1 through stage 4 chronic kidney disease, or unspecified chronic kidney disease: Secondary | ICD-10-CM | POA: Diagnosis not present

## 2019-06-06 DIAGNOSIS — L89322 Pressure ulcer of left buttock, stage 2: Secondary | ICD-10-CM | POA: Diagnosis not present

## 2019-06-06 DIAGNOSIS — L89312 Pressure ulcer of right buttock, stage 2: Secondary | ICD-10-CM | POA: Diagnosis not present

## 2019-06-06 DIAGNOSIS — D631 Anemia in chronic kidney disease: Secondary | ICD-10-CM | POA: Diagnosis not present

## 2019-06-06 DIAGNOSIS — N182 Chronic kidney disease, stage 2 (mild): Secondary | ICD-10-CM | POA: Diagnosis not present

## 2019-06-06 DIAGNOSIS — N401 Enlarged prostate with lower urinary tract symptoms: Secondary | ICD-10-CM | POA: Diagnosis not present

## 2019-06-12 DIAGNOSIS — L89312 Pressure ulcer of right buttock, stage 2: Secondary | ICD-10-CM | POA: Diagnosis not present

## 2019-06-12 DIAGNOSIS — I129 Hypertensive chronic kidney disease with stage 1 through stage 4 chronic kidney disease, or unspecified chronic kidney disease: Secondary | ICD-10-CM | POA: Diagnosis not present

## 2019-06-12 DIAGNOSIS — E1122 Type 2 diabetes mellitus with diabetic chronic kidney disease: Secondary | ICD-10-CM | POA: Diagnosis not present

## 2019-06-12 DIAGNOSIS — I2510 Atherosclerotic heart disease of native coronary artery without angina pectoris: Secondary | ICD-10-CM | POA: Diagnosis not present

## 2019-06-12 DIAGNOSIS — N401 Enlarged prostate with lower urinary tract symptoms: Secondary | ICD-10-CM | POA: Diagnosis not present

## 2019-06-12 DIAGNOSIS — E1151 Type 2 diabetes mellitus with diabetic peripheral angiopathy without gangrene: Secondary | ICD-10-CM | POA: Diagnosis not present

## 2019-06-12 DIAGNOSIS — D631 Anemia in chronic kidney disease: Secondary | ICD-10-CM | POA: Diagnosis not present

## 2019-06-12 DIAGNOSIS — N182 Chronic kidney disease, stage 2 (mild): Secondary | ICD-10-CM | POA: Diagnosis not present

## 2019-06-12 DIAGNOSIS — I639 Cerebral infarction, unspecified: Secondary | ICD-10-CM | POA: Diagnosis not present

## 2019-06-12 DIAGNOSIS — L89322 Pressure ulcer of left buttock, stage 2: Secondary | ICD-10-CM | POA: Diagnosis not present

## 2019-07-13 DIAGNOSIS — I639 Cerebral infarction, unspecified: Secondary | ICD-10-CM | POA: Diagnosis not present

## 2019-08-12 DIAGNOSIS — I639 Cerebral infarction, unspecified: Secondary | ICD-10-CM | POA: Diagnosis not present

## 2019-08-26 DIAGNOSIS — Z87891 Personal history of nicotine dependence: Secondary | ICD-10-CM | POA: Diagnosis not present

## 2019-08-26 DIAGNOSIS — J9811 Atelectasis: Secondary | ICD-10-CM | POA: Diagnosis not present

## 2019-08-26 DIAGNOSIS — J40 Bronchitis, not specified as acute or chronic: Secondary | ICD-10-CM | POA: Diagnosis not present

## 2019-08-26 DIAGNOSIS — I700 Atherosclerosis of aorta: Secondary | ICD-10-CM | POA: Diagnosis not present

## 2019-08-26 DIAGNOSIS — R05 Cough: Secondary | ICD-10-CM | POA: Diagnosis not present

## 2019-09-12 DIAGNOSIS — I639 Cerebral infarction, unspecified: Secondary | ICD-10-CM | POA: Diagnosis not present

## 2019-09-19 ENCOUNTER — Emergency Department: Payer: Medicare HMO

## 2019-09-19 ENCOUNTER — Emergency Department
Admission: EM | Admit: 2019-09-19 | Discharge: 2019-09-20 | Disposition: A | Discharge status: Home / Self Care | Payer: Medicare HMO | Attending: Emergency Medicine | Admitting: Emergency Medicine

## 2019-09-19 ENCOUNTER — Other Ambulatory Visit: Payer: Self-pay

## 2019-09-19 ENCOUNTER — Encounter: Payer: Self-pay | Admitting: Emergency Medicine

## 2019-09-19 DIAGNOSIS — Z951 Presence of aortocoronary bypass graft: Secondary | ICD-10-CM | POA: Diagnosis not present

## 2019-09-19 DIAGNOSIS — R404 Transient alteration of awareness: Secondary | ICD-10-CM | POA: Diagnosis not present

## 2019-09-19 DIAGNOSIS — Z79899 Other long term (current) drug therapy: Secondary | ICD-10-CM | POA: Insufficient documentation

## 2019-09-19 DIAGNOSIS — E1140 Type 2 diabetes mellitus with diabetic neuropathy, unspecified: Secondary | ICD-10-CM | POA: Insufficient documentation

## 2019-09-19 DIAGNOSIS — F17220 Nicotine dependence, chewing tobacco, uncomplicated: Secondary | ICD-10-CM | POA: Insufficient documentation

## 2019-09-19 DIAGNOSIS — Z7984 Long term (current) use of oral hypoglycemic drugs: Secondary | ICD-10-CM | POA: Insufficient documentation

## 2019-09-19 DIAGNOSIS — R4182 Altered mental status, unspecified: Secondary | ICD-10-CM | POA: Insufficient documentation

## 2019-09-19 DIAGNOSIS — K5900 Constipation, unspecified: Secondary | ICD-10-CM

## 2019-09-19 DIAGNOSIS — Z952 Presence of prosthetic heart valve: Secondary | ICD-10-CM | POA: Insufficient documentation

## 2019-09-19 DIAGNOSIS — N12 Tubulo-interstitial nephritis, not specified as acute or chronic: Secondary | ICD-10-CM | POA: Insufficient documentation

## 2019-09-19 DIAGNOSIS — I10 Essential (primary) hypertension: Secondary | ICD-10-CM | POA: Insufficient documentation

## 2019-09-19 DIAGNOSIS — R519 Headache, unspecified: Secondary | ICD-10-CM | POA: Diagnosis not present

## 2019-09-19 DIAGNOSIS — I252 Old myocardial infarction: Secondary | ICD-10-CM | POA: Insufficient documentation

## 2019-09-19 DIAGNOSIS — N1330 Unspecified hydronephrosis: Secondary | ICD-10-CM | POA: Diagnosis not present

## 2019-09-19 DIAGNOSIS — R5381 Other malaise: Secondary | ICD-10-CM | POA: Diagnosis not present

## 2019-09-19 DIAGNOSIS — R531 Weakness: Secondary | ICD-10-CM | POA: Diagnosis not present

## 2019-09-19 LAB — TROPONIN I (HIGH SENSITIVITY)
Troponin I (High Sensitivity): 25 ng/L — ABNORMAL HIGH (ref <18)
Troponin I (High Sensitivity): 20 ng/L — ABNORMAL HIGH (ref <18)

## 2019-09-19 LAB — URINALYSIS, COMPLETE (UACMP) WITH MICROSCOPIC
Bilirubin Urine: NEGATIVE
Glucose, UA: NEGATIVE mg/dL
Ketones, ur: NEGATIVE mg/dL
Nitrite: NEGATIVE
Protein, ur: 100 mg/dL — AB
Specific Gravity, Urine: 1.009 (ref 1.005–1.030)
Squamous Epithelial / HPF: NONE SEEN (ref 0–5)
WBC, UA: >50 WBC/hpf — ABNORMAL HIGH (ref 0–5)
pH: 6 (ref 5.0–8.0)

## 2019-09-19 LAB — BASIC METABOLIC PANEL
Anion gap: 11 (ref 5–15)
BUN: 40 mg/dL — ABNORMAL HIGH (ref 8–23)
CO2: 17 mmol/L — ABNORMAL LOW (ref 22–32)
Calcium: 9.7 mg/dL (ref 8.9–10.3)
Chloride: 105 mmol/L (ref 98–111)
Creatinine, Ser: 2.38 mg/dL — ABNORMAL HIGH (ref 0.61–1.24)
GFR calc Af Amer: 27 mL/min — ABNORMAL LOW (ref >60)
GFR calc non Af Amer: 23 mL/min — ABNORMAL LOW (ref >60)
Glucose, Bld: 249 mg/dL — ABNORMAL HIGH (ref 70–99)
Potassium: 4.9 mmol/L (ref 3.5–5.1)
Sodium: 133 mmol/L — ABNORMAL LOW (ref 135–145)

## 2019-09-19 LAB — CBC
HCT: 33.1 % — ABNORMAL LOW (ref 39.0–52.0)
Hemoglobin: 11.5 g/dL — ABNORMAL LOW (ref 13.0–17.0)
MCH: 27.6 pg (ref 26.0–34.0)
MCHC: 34.7 g/dL (ref 30.0–36.0)
MCV: 79.4 fL — ABNORMAL LOW (ref 80.0–100.0)
Platelets: 264 10*3/uL (ref 150–400)
RBC: 4.17 MIL/uL — ABNORMAL LOW (ref 4.22–5.81)
RDW: 15.4 % (ref 11.5–15.5)
WBC: 5.2 10*3/uL (ref 4.0–10.5)
nRBC: 0 % (ref 0.0–0.2)

## 2019-09-19 MED ORDER — SODIUM CHLORIDE 0.9 % IV SOLN
1.0000 g | Freq: Once | INTRAVENOUS | Status: AC
  Administered 2019-09-19: 1 g via INTRAVENOUS
  Filled 2019-09-19: qty 10

## 2019-09-19 MED ORDER — CEFDINIR 300 MG PO CAPS
300.0000 mg | ORAL_CAPSULE | Freq: Two times a day (BID) | ORAL | 0 refills | Status: AC
Start: 2019-09-19 — End: 2019-09-29

## 2019-09-19 MED ORDER — SODIUM CHLORIDE 0.9 % IV BOLUS
500.0000 mL | Freq: Once | INTRAVENOUS | Status: AC
  Administered 2019-09-19: 500 mL via INTRAVENOUS

## 2019-09-19 NOTE — ED Notes (Signed)
Pt updated in [place], VS reassessed

## 2019-09-19 NOTE — ED Notes (Signed)
Pt changed and peri care provided. Family sitting with pt. Stretcher locked in lowest position, call bell within reach.

## 2019-09-19 NOTE — ED Triage Notes (Signed)
Patient's daughter here with patient. She lives with patient. For the last few days, he has seemed more confused and weak than normal. She suspects UTI but has not been able to obtain urine specimen for PCP.

## 2019-09-19 NOTE — ED Notes (Signed)
In and out performed with Ramond Dial

## 2019-09-19 NOTE — Discharge Instructions (Addendum)
  We are treating him for a urinary infection that could have traveled up into the kidneys.  He should follow-up with his primary care doctor in 2 to 3 days to make sure that the urine culture is sensitive to the antibiotic that we prescribed.  Also to recheck his kidney function. There is no signs of a kidney stone but he does have some dilation of his kidneys that could be secondary to infection.  If he develops fevers, pain, vomiting or any other concerns he can return him to the ER for consideration of IV antibiotics.  At this time you prefer to take him home.   For the kidney dilation he can follow up with urology but there was no signs of kidney stones.    He also had a lot of stool in his vault.  You take a capful of MiraLAX daily to help with constipation.   CT scan:   IMPRESSION:  1. Circumferential bladder wall thickening and extensive  perivesicular hazy stranding with moderate bilateral  hydroureteronephrosis and mild urothelial thickening without  obstructing urolithiasis. Gradient density noted within the bladder  could reflect debris or hemorrhage. Findings are concerning for  cystitis and ascending urinary tract infection. Recommend  correlation with urinalysis.  2. Distension of the rectum though sigmoid with a large stool ball  measuring up to 8.7 cm maximal diameter with some faint perirectal  stranding and mild mural thickening. Findings are concerning for  fecal impaction and possible stercoral colitis.  3. Aortic Atherosclerosis (ICD10-I70.0).  4. Focal fusiform ectasia of the infrarenal abdominal aorta to 3.1  cm. Recommend followup by ultrasound in 3 years. This recommendation  follows ACR consensus guidelines: White Paper of the ACR Incidental  Findings Committee II on Vascular Findings. J Am Coll Radiol 2013;  10:789-794. Aortic aneurysm NOS (ICD10-I71.9)  5. Postsurgical changes from prior sternotomy, CABG and aortic valve  replacement. Extensive cortication of  the native coronary arteries.  Coronary artery calcifications are present. Please note that the  presence of coronary artery calcium documents the presence of  coronary artery disease, the severity of this disease and any  potential stenosis cannot be assessed on this non-gated CT  examination. Assessment for potential risk factor modification,  dietary therapy or pharmacologic therapy may be warranted.

## 2019-09-19 NOTE — ED Provider Notes (Signed)
[facility] Emergency Department Provider Note  ____________________________________________   First MD Initiated Contact with Patient 09/19/19 1800     (approximate)  I have reviewed the triage vital signs and the nursing notes.   HISTORY  Chief Complaint Weakness and Altered Mental Status    HPI Matthew Bruce is a 86 y.o. male with diabetes, HTN, HLD, Anemia, prior stroke who comes in with daughter for increasing weakness and confusion.  This has happened over 2 days. She noticed difficulty standing as well. They called PCP and were concerned for dehydration vs UTI. Unable to give sample to PCP because difficult to collect it. She wants to get a urine sample. His appetite seems less then normal as well.  They are requesting an in and out catheter to get urine sample to make sure no signs of UTI.  Confusion has been mild to moderate, nothing makes better, nothing makes it worse, gradually increasing over the past few days.  Denies any known falls.  Patient lives at home with the daughter who is at bedside.     Past Medical History:  Diagnosis Date  . Anemia   . Aortic valve disorder   . Coronary artery disease   . Diabetes mellitus without complication ([place])   . Glaucoma   . History of BPH   . History of kidney stones   . HOH (hard of hearing)    Bilateral Hearing Aids  . Hyperlipidemia   . Hypertension   . Kidney stones   . MRSA (methicillin resistant staph aureus) culture positive 04/01/2016   LEFT FOOT  . Myocardial infarction ([place])   . Neuropathy   . Peripheral vascular disease [facility])     Patient Active Problem List   Diagnosis Date Noted  . Sepsis ([place]) 07/03/2018  . Seizure ([place] ) 09/07/2016  . Pressure injury of skin 05/27/2016  . Elevated lactic acid level 05/26/2016  . Weakness 05/26/2016  . PAD (peripheral artery disease) ([place]) 03/24/2016  . Foot ulceration, left, with necrosis of muscle ([place]) 03/24/2016  . Essential  hypertension 02/02/2016  . Diabetes ([place]) 02/02/2016  . Atherosclerotic peripheral vascular disease with ulceration ([place]) 02/02/2016  . Valvular heart disease 02/02/2016  . Embolic stroke involving right cerebellar artery ([place]) 11/10/2014  . Hypoglycemia 11/05/2014    Past Surgical History:  Procedure Laterality Date  . AMPUTATION TOE Left 02/11/2016   Procedure: AMPUTATION TOE;  Surgeon: Albertine Patricia, DPM;  Location: [facility] ORS;  Service: Podiatry;  Laterality: Left;  . AMPUTATION TOE Left 08/19/2016   Procedure: AMPUTATION TOE;  Surgeon: Albertine Patricia, DPM;  Location: [facility] OR[facility];  Service: Podiatry;  Laterality: Left;  . APLIGRAFT PLACEMENT Right 08/19/2016   Procedure: APLIGRAFT PLACEMENT;  Surgeon: Albertine Patricia, DPM;  Location: [facility] OR[facility];  Service: Podiatry;  Laterality: Right;  . APPLICATION OF WOUND VAC Left 04/01/2016   Procedure: APPLICATION OF WOUND VAC;  Surgeon: Albertine Patricia, DPM;  Location: [facility] OR[facility];  Service: Podiatry;  Laterality: Left;  . CARDIAC CATHETERIZATION    . CARDIAC SURGERY    . CARDIAC VALVE REPLACEMENT    . CORONARY ARTERY BYPASS GRAFT    . CYSTOSCOPY W/ RETROGRADES Right 07/04/2018   Procedure: CYSTOSCOPY WITH RETROGRADE PYELOGRAM;  Surgeon: Billey Co, MD;  Location: [facility];  Service: Urology;  Laterality: Right;  . CYSTOSCOPY WITH STENT PLACEMENT Right 07/04/2018   Procedure: CYSTOSCOPY WITH STENT PLACEMENT;  Surgeon: Billey Co, MD;  Location: [facility];  Service: Urology;  Laterality: Right;  .  CYSTOSCOPY/URETEROSCOPY/HOLMIUM LASER/STENT PLACEMENT Right 09/10/2018   Procedure: CYSTOSCOPY/URETEROSCOPY/HOLMIUM LASER/STENT PLACEMENT;  Surgeon: Billey Co, MD;  Location: [facility];  Service: Urology;  Laterality: Right;  . INCISION AND DRAINAGE Right 08/19/2016   Procedure: IRRIGATION AND DEBRIDEMENT RIGHT GREAT TOE;  Surgeon: Albertine Patricia, DPM;  Location: [facility];  Service: Podiatry;  Laterality: Right;  . IRRIGATION AND DEBRIDEMENT FOOT  Left 04/01/2016   Procedure: IRRIGATION AND DEBRIDEMENT FOOT;  Surgeon: Albertine Patricia, DPM;  Location: [facility];  Service: Podiatry;  Laterality: Left;  . IRRIGATION AND DEBRIDEMENT FOOT Left 05/13/2016   Procedure: IRRIGATION AND DEBRIDEMENT FOOT;  Surgeon: Albertine Patricia, DPM;  Location: [facility];  Service: Podiatry;  Laterality: Left;  . LOWER EXTREMITY ANGIOGRAPHY Right 07/06/2016   Procedure: Lower Extremity Angiography;  Surgeon: Algernon Huxley, MD;  Location: [place] [facility];  Service: Cardiovascular;  Laterality: Right;  . LOWER EXTREMITY ANGIOGRAPHY Right 07/18/2016   Procedure: Lower Extremity Angiography;  Surgeon: Algernon Huxley, MD;  Location: [place] [facility];  Service: Cardiovascular;  Laterality: Right;  . OSTECTOMY Left 05/13/2016   Procedure: OSTECTOMY;  Surgeon: Albertine Patricia, DPM;  Location: [facility];  Service: Podiatry;  Laterality: Left;  . PERIPHERAL VASCULAR CATHETERIZATION Left 02/03/2016   Procedure: Lower Extremity Angiography;  Surgeon: Algernon Huxley, MD;  Location: [place] [facility];  Service: Cardiovascular;  Laterality: Left;  . PERIPHERAL VASCULAR CATHETERIZATION Left 04/01/2016   Procedure: Lower Extremity Angiography;  Surgeon: Katha Cabal, MD;  Location: [place] [facility];  Service: Cardiovascular;  Laterality: Left;  Marland Kitchen VALVE REPLACEMENT  2007   Aortic Valve Replacement, [employer] Jude Porcine Valve    Prior to Admission medications   Medication Sig Start Date End Date Taking? Authorizing Provider  amLODipine (NORVASC) 5 MG tablet Take 5 mg by mouth 2 (two) times daily. 08/25/16   [provider]  amoxicillin (AMOXIL) 500 MG capsule Take 1 capsule (500 mg total) by mouth daily. Patient not taking: Reported on 09/17/2018 09/10/18   Billey Co, MD  clopidogrel (PLAVIX) 75 MG tablet TAKE 1 TABLET BY MOUTH EVERY DAY Patient taking differently: Take 75 mg by mouth daily.  04/27/18   Algernon Huxley, MD  finasteride (PROSCAR) 5 MG tablet Take 5 mg by  mouth at bedtime.     [provider]  glipiZIDE (GLUCOTROL) 5 MG tablet Take 5 mg by mouth daily. 08/17/16   [provider]  ibuprofen (ADVIL,MOTRIN) 800 MG tablet Take 800 mg by mouth 3 (three) times daily as needed for pain. 02/22/18   [provider]  latanoprost (XALATAN) 0.005 % ophthalmic solution Place 1 drop into both eyes at bedtime.    [provider]  levETIRAcetam (KEPPRA) 500 MG tablet Take 1 tablet (500 mg total) by mouth 2 (two) times daily. 09/08/16   Henreitta Leber, MD  lovastatin (MEVACOR) 40 MG tablet Take 40 mg by mouth daily.     [provider]  magnesium oxide (MAG-OX) 400 MG tablet Take 400 mg by mouth daily.    [provider]  metFORMIN (GLUCOPHAGE) 1000 MG tablet Take 1,000 mg by mouth 2 (two) times a day.     [provider]  tamsulosin (FLOMAX) 0.4 MG CAPS capsule Take 1 capsule (0.4 mg total) by mouth daily. 05/28/16   Demetrios Loll, MD    Allergies Sulfa antibiotics and Macrobid [nitrofurantoin monohyd macro]  Family History  Problem Relation Age of Onset  . Heart attack Mother   .  Alcohol abuse Father   . Throat cancer Sister   . Heart attack Brother   . Heart failure Neg Hx     Social History Social History   Tobacco Use  . Smoking status: Former Smoker    Packs/day: 1.00    Types: Cigarettes  . Smokeless tobacco: Current User    Types: Chew  Vaping Use  . Vaping Use: Never used  Substance Use Topics  . Alcohol use: No  . Drug use: No      Review of Systems Constitutional: No fever/chills, weakness  Eyes: No visual changes. ENT: No sore throat. Cardiovascular: Denies chest pain. Respiratory: Denies shortness of breath. Gastrointestinal: No abdominal pain.  No nausea, no vomiting.  No diarrhea.  No constipation. Not eating as much  Genitourinary: Negative for dysuria. Musculoskeletal: Negative for back pain. Skin: Negative for rash. Neurological: Negative for headaches, focal  weakness or numbness. Confusion  All other ROS negative ____________________________________________   PHYSICAL EXAM:  VITAL SIGNS: ED Triage Vitals  Enc Vitals Group     BP 09/19/19 1158 110/76     Pulse Rate 09/19/19 1158 95     Resp 09/19/19 1158 18     Temp 09/19/19 1158 98 F (36.7 C)     Temp Source 09/19/19 1158 Oral     SpO2 09/19/19 1158 95 %     Weight 09/19/19 1159 171 lb 15.3 oz (78 kg)     Height 09/19/19 1159 6' (1.829 m)     Head Circumference --      Peak Flow --      Pain Score 09/19/19 1438 0     Pain Loc --      Pain Edu? --      Excl. in [place]? --     Constitutional: initially sleeping but awakes to tactile stimuli  Eyes: Conjunctivae are normal. EOMI. Head: Atraumatic. Hearing aids in place.  Nose: No congestion/rhinnorhea. Mouth/Throat: Mucous membranes are moist.   Neck: No stridor. Trachea Midline. FROM Cardiovascular: Normal rate, regular rhythm. Grossly normal heart sounds.  Good peripheral circulation. Respiratory: Normal respiratory effort.  No retractions. Lungs CTAB. Gastrointestinal: Soft and nontender. No distention. No abdominal bruits.  Musculoskeletal: No lower extremity tenderness nor edema.  No joint effusions. Neurologic:  Normal speech and language. No gross focal neurologic deficits are appreciated.  Skin:  Skin is warm, dry and intact. No rash noted. Psychiatric: Mood and affect are normal. Speech and behavior are normal. GU: Deferred   ____________________________________________   LABS (all labs ordered are listed, but only abnormal results are displayed)  Labs Reviewed  BASIC METABOLIC PANEL - Abnormal; Notable for the following components:      Result Value   Sodium 133 (*)    CO2 17 (*)    Glucose, Bld 249 (*)    BUN 40 (*)    Creatinine, Ser 2.38 (*)    GFR calc non Af Amer 23 (*)    GFR calc Af Amer 27 (*)    All other components within normal limits  CBC - Abnormal; Notable for the following components:   RBC  4.17 (*)    Hemoglobin 11.5 (*)    HCT 33.1 (*)    MCV 79.4 (*)    All other components within normal limits  URINALYSIS, COMPLETE (UACMP) WITH MICROSCOPIC - Abnormal; Notable for the following components:   Color, Urine YELLOW (*)    APPearance TURBID (*)    Hgb urine dipstick SMALL (*)  Protein, ur 100 (*)    Leukocytes,Ua LARGE (*)    WBC, UA >50 (*)    Bacteria, UA MANY (*)    All other components within normal limits  TROPONIN I (HIGH SENSITIVITY) - Abnormal; Notable for the following components:   Troponin I (High Sensitivity) 25 (*)    All other components within normal limits  TROPONIN I (HIGH SENSITIVITY) - Abnormal; Notable for the following components:   Troponin I (High Sensitivity) 20 (*)    All other components within normal limits  URINE CULTURE   ____________________________________________   ED ECG REPORT I, Vanessa [place], the attending physician, personally viewed and interpreted this ECG.  Ectopic atrial focus, no st elevation, st depression II, III, AVF, V4-V6, no twi, normal intervals. Similar st flatting maybe slightly more depressed but difficult to tell.  ____________________________________________  RADIOLOGY   Official radiology report(s): CT Head Wo Contrast  Result Date: 09/19/2019 CLINICAL DATA:  Headache, weakness, confusion EXAM: CT HEAD WITHOUT CONTRAST TECHNIQUE: Contiguous axial images were obtained from the base of the skull through the vertex without intravenous contrast. COMPARISON:  09/06/2016 FINDINGS: Brain: There is atrophy and chronic small vessel disease changes. No acute intracranial abnormality. Specifically, no hemorrhage, hydrocephalus, mass lesion, acute infarction, or significant intracranial injury. Vascular: No hyperdense vessel or unexpected calcification. Skull: No acute calvarial abnormality. Sinuses/Orbits: Opacified right maxillary sinus. Other: None IMPRESSION: Atrophy, chronic microvascular disease. No acute intracranial  abnormality. Electronically Signed   By: Rolm Baptise M.D.   On: 09/19/2019 22:01   CT Renal Stone Study  Result Date: 09/19/2019 CLINICAL DATA:  Flank pain, stone disease suspected EXAM: CT ABDOMEN AND PELVIS WITHOUT CONTRAST TECHNIQUE: Multidetector CT imaging of the abdomen and pelvis was performed following the standard protocol without IV contrast. COMPARISON:  CT 07/04/2018 FINDINGS: Lower chest: The cardiac size within normal limits. Extensive three-vessel coronary artery disease. Postsurgical changes from prior sternotomy and bypass. No pericardial effusion. Extensive calcifications seen upon the mitral annulus and native aortic root with evidence of prior aortic valve replacement, incompletely assessed on this exam. Small foci of gas in the right atrium and ventricle likely related to intravenous access. Some atelectatic changes are present in the otherwise clear lung bases. Some mild bronchitic changes noted in the lower lungs as well. Hepatobiliary: No visible hepatic lesions on this noncontrast CT examination. Smooth liver surface contour. Normal hepatic attenuation. Normal gallbladder and biliary tree without visible calcified gallstones or frank biliary ductal dilatation. Pancreas: Mild pancreatic atrophy. No inflammation, discernible lesions, or ductal dilatation. Spleen: Normal in size without focal abnormality. Adrenals/Urinary Tract: Normal adrenal glands. Mild nonspecific bilateral perinephric stranding is similar to comparison exam. Few fluid attenuation cysts are again seen in both kidneys. Some scarring noted in the interpolar right kidney likely related to collapse of the cyst seen on comparison imaging. There is moderate bilateral hydroureteronephrosis and mild urothelial thickening without obstructing urolithiasis. There is circumferential bladder wall thickening and extensive perivesicular hazy stranding. Gradient density noted within the bladder could reflect debris or hemorrhage.  Stomach/Bowel: Small sliding-type hiatal hernia. Fatty infiltration of the gastric mucosa may reflect chronic gastritis. Duodenum is unremarkable. No small bowel thickening or dilatation. No evidence of bowel obstruction. A normal appendix is visualized. No colonic dilatation or wall thickening. Distension of the rectum though sigmoid with a large stool ball measuring up to 8.7 cm maximal diameter with some faint perirectal stranding and mild mural thickening. Vascular/Lymphatic: Atherosclerotic calcifications throughout the abdominal aorta and branch vessels. Focal fusiform  ectasia of the infrarenal abdominal aorta measuring up to 3.1 cm in size. This is mildly increased from 2.7 cm in 2020. No enlarged abdominopelvic lymph nodes. Reproductive: Prostatomegaly with indentation of the bladder base. Few likely benign peripheral prosthetic calcifications similar to prior. Other: Mild perirectal and perivesicular stranding, as above. No bowel containing hernias. No abdominopelvic free air or fluid. Musculoskeletal: The osseous structures appear diffusely demineralized which may limit detection of small or nondisplaced fractures. No acute osseous abnormality or suspicious osseous lesion. Multilevel degenerative changes are present in the imaged portions of the spine. Features most pronounced at T10-11, L5-S1. Additional degenerative and enthesopathic changes about the hips and pelvis. IMPRESSION: 1. Circumferential bladder wall thickening and extensive perivesicular hazy stranding with moderate bilateral hydroureteronephrosis and mild urothelial thickening without obstructing urolithiasis. Gradient density noted within the bladder could reflect debris or hemorrhage. Findings are concerning for cystitis and ascending urinary tract infection. Recommend correlation with urinalysis. 2. Distension of the rectum though sigmoid with a large stool ball measuring up to 8.7 cm maximal diameter with some faint perirectal stranding  and mild mural thickening. Findings are concerning for fecal impaction and possible stercoral colitis. 3. Aortic Atherosclerosis (ICD10-I70.0). 4. Focal fusiform ectasia of the infrarenal abdominal aorta to 3.1 cm. Recommend followup by ultrasound in 3 years. This recommendation follows ACR consensus guidelines: White Paper of the ACR Incidental Findings Committee II on Vascular Findings. J Am Coll Radiol 2013; 10:789-794. Aortic aneurysm NOS (ICD10-I71.9) 5. Postsurgical changes from prior sternotomy, CABG and aortic valve replacement. Extensive cortication of the native coronary arteries. Coronary artery calcifications are present. Please note that the presence of coronary artery calcium documents the presence of coronary artery disease, the severity of this disease and any potential stenosis cannot be assessed on this non-gated CT examination. Assessment for potential risk factor modification, dietary therapy or pharmacologic therapy may be warranted. Electronically Signed   By: Lovena Le M.D.   On: 09/19/2019 22:07    ____________________________________________   PROCEDURES  Procedure(s) performed (including Critical Care):  Procedures  ------------------------------------------------------------------------------------------------------------------- Fecal Disimpaction Procedure Note:  Performed by me:  Patient placed in the lateral recumbent position with knees drawn towards chest. Nurse present for patient support. Large amount of  brown stool removed. No complications during procedure.   ------------------------------------------------------------------------------------------------------------------   ____________________________________________   INITIAL IMPRESSION / ASSESSMENT AND PLAN / ED COURSE  Matthew Bruce was evaluated in Emergency Department on 09/19/2019 for the symptoms described in the history of present illness. He was evaluated in the context of the global  COVID-19 pandemic, which necessitated consideration that the patient might be at risk for infection with the SARS-CoV-2 virus that causes COVID-19. Institutional protocols and algorithms that pertain to the evaluation of patients at risk for COVID-19 are in a state of rapid change based on information released by regulatory bodies including the CDC and federal and state organizations. These policies and algorithms were followed during the patient's care in the ED.    Pt with confusion and weakness. Will get labs to evaluate for electrolyte abnormalities, aki, UTI.  Per family request will do in and out catheter to obtain a UA sample.  No chest pain but given slightly abnormal ekg will get cardiac markers to make sure no acs.   Hemoglobin is around baseline  Cardiac markers are stable from 25-20 denies any chest pain so likely to be secondary to ACS.   Kidney function is 2.38.  May be slightly elevated from his prior 1.94 but is  been as high as 2.84 previously.Marland Kitchen  His sugar slightly elevated to 249 but no anion gap elevation to suggest DKA.  His urine is consistent with UTI.  Review of prior records patient's had significance of the kidney stones requiring stent placement I discussed with the daughter who stated that he really never had any symptoms.  Given were not able to get a great history from him due to his dementia I recommended we do a CT scan to make sure there is no renal stone that would require stenting again.  We will do a CT renal given his confusion I also recommend a CT head to make sure no signs of intracranial hemorrhage, hydrocephalus could be causing his confusion as well.  They are agreeable to the above.  CT head negative for acute processes  CT renal shows bladder wall thickening and moderate bilateral hydroureteronephrosis concerning for cystitis and ascending urinary tract infection.  His urine was consistent with UTI therefore we will treat for possible ascending UTI with  cefdinir for 10 days.  Given a dose of ceftriaxone before discharge.  It is possible the patient is having some urinary retention but given we did straight cath prior to the CT scan a bladder scan would not be accurate at this time.  His kidney function is not significantly different from his baseline but did talk to the family about following up with his primary care doctor for urine culture results and repeat kidney function because if it continues to climb he may need Foley to be placed.  I also messaged his primary doctor to let them be aware of this concern and probably need a bladder scan when he gets follow-up.  CT scan also showed a large stool ball which, which they agreed to attempting disimpaction.  I was able to pull about baseball size of stool out but they requested that I then stopped due to patient being in some discomfort.  We discussed treatment for his constipation as well.  We discussed admission for IV antibiotics if they felt that he was too weak with confused to go home with family so they would much prefer to go home.  They have been in the ER for greater than 12 hours to the long wait for being seen in after this time they feel comfortable going home and will follow up with her primary care doctor     ____________________________________________   FINAL CLINICAL IMPRESSION(S) / ED DIAGNOSES   Final diagnoses:  Pyelonephritis  Constipation, unspecified constipation type      MEDICATIONS GIVEN DURING THIS VISIT:  Medications  sodium chloride 0.9 % bolus 500 mL (0 mLs Intravenous Stopped 09/19/19 1943)  cefTRIAXone (ROCEPHIN) 1 g in sodium chloride 0.9 % 100 mL IVPB (0 g Intravenous Stopped 09/19/19 2053)     ED Discharge Orders         Ordered    cefdinir (OMNICEF) 300 MG capsule  2 times daily     Discontinue  Reprint     09/19/19 2307           Note:  This document was prepared using Dragon voice recognition software and may include unintentional dictation  errors.   Vanessa [place], MD 09/20/19 [phone]

## 2019-09-19 NOTE — ED Triage Notes (Signed)
First Nurse Note:  Patient presents to the ED via EMS for increased weakness and confusion x 2 days.  Per EMS, patient has strong urine smell.  Per EMS, patient's vital signs are within normal limits.  Patient is having difficulty standing.

## 2019-09-22 LAB — URINE CULTURE: Culture: >=100000 — AB

## 2019-09-26 DIAGNOSIS — N12 Tubulo-interstitial nephritis, not specified as acute or chronic: Secondary | ICD-10-CM | POA: Diagnosis not present

## 2019-09-26 DIAGNOSIS — N182 Chronic kidney disease, stage 2 (mild): Secondary | ICD-10-CM | POA: Diagnosis not present

## 2019-09-26 DIAGNOSIS — F17200 Nicotine dependence, unspecified, uncomplicated: Secondary | ICD-10-CM | POA: Diagnosis not present

## 2019-09-26 DIAGNOSIS — B372 Candidiasis of skin and nail: Secondary | ICD-10-CM | POA: Diagnosis not present

## 2019-09-26 DIAGNOSIS — G9341 Metabolic encephalopathy: Secondary | ICD-10-CM | POA: Diagnosis not present

## 2019-09-26 DIAGNOSIS — E1122 Type 2 diabetes mellitus with diabetic chronic kidney disease: Secondary | ICD-10-CM | POA: Diagnosis not present

## 2019-09-26 DIAGNOSIS — Z09 Encounter for follow-up examination after completed treatment for conditions other than malignant neoplasm: Secondary | ICD-10-CM | POA: Diagnosis not present

## 2019-10-09 DIAGNOSIS — E11649 Type 2 diabetes mellitus with hypoglycemia without coma: Secondary | ICD-10-CM | POA: Diagnosis not present

## 2019-10-09 DIAGNOSIS — L97411 Non-pressure chronic ulcer of right heel and midfoot limited to breakdown of skin: Secondary | ICD-10-CM | POA: Diagnosis not present

## 2019-10-09 DIAGNOSIS — E11621 Type 2 diabetes mellitus with foot ulcer: Secondary | ICD-10-CM | POA: Diagnosis not present

## 2019-10-09 DIAGNOSIS — Z87891 Personal history of nicotine dependence: Secondary | ICD-10-CM | POA: Diagnosis not present

## 2019-10-12 DIAGNOSIS — I639 Cerebral infarction, unspecified: Secondary | ICD-10-CM | POA: Diagnosis not present

## 2019-10-14 DIAGNOSIS — L97412 Non-pressure chronic ulcer of right heel and midfoot with fat layer exposed: Secondary | ICD-10-CM | POA: Diagnosis not present

## 2019-10-14 DIAGNOSIS — E1140 Type 2 diabetes mellitus with diabetic neuropathy, unspecified: Secondary | ICD-10-CM | POA: Diagnosis not present

## 2019-10-28 DIAGNOSIS — L97412 Non-pressure chronic ulcer of right heel and midfoot with fat layer exposed: Secondary | ICD-10-CM | POA: Diagnosis not present

## 2019-10-28 DIAGNOSIS — E1140 Type 2 diabetes mellitus with diabetic neuropathy, unspecified: Secondary | ICD-10-CM | POA: Diagnosis not present

## 2019-11-03 ENCOUNTER — Emergency Department: Payer: Medicare HMO

## 2019-11-03 ENCOUNTER — Inpatient Hospital Stay
Admission: EM | Admit: 2019-11-03 | Discharge: 2019-11-05 | DRG: 690 | Disposition: A | Discharge status: Home Health | Payer: Medicare HMO | Attending: Internal Medicine | Admitting: Internal Medicine

## 2019-11-03 ENCOUNTER — Other Ambulatory Visit: Payer: Self-pay

## 2019-11-03 ENCOUNTER — Encounter: Payer: Self-pay | Admitting: Emergency Medicine

## 2019-11-03 DIAGNOSIS — J189 Pneumonia, unspecified organism: Secondary | ICD-10-CM

## 2019-11-03 DIAGNOSIS — I10 Essential (primary) hypertension: Secondary | ICD-10-CM | POA: Diagnosis present

## 2019-11-03 DIAGNOSIS — E1152 Type 2 diabetes mellitus with diabetic peripheral angiopathy with gangrene: Secondary | ICD-10-CM

## 2019-11-03 DIAGNOSIS — E1122 Type 2 diabetes mellitus with diabetic chronic kidney disease: Secondary | ICD-10-CM | POA: Diagnosis present

## 2019-11-03 DIAGNOSIS — E11621 Type 2 diabetes mellitus with foot ulcer: Secondary | ICD-10-CM | POA: Diagnosis present

## 2019-11-03 DIAGNOSIS — Z7984 Long term (current) use of oral hypoglycemic drugs: Secondary | ICD-10-CM

## 2019-11-03 DIAGNOSIS — J80 Acute respiratory distress syndrome: Secondary | ICD-10-CM | POA: Diagnosis not present

## 2019-11-03 DIAGNOSIS — Z7401 Bed confinement status: Secondary | ICD-10-CM

## 2019-11-03 DIAGNOSIS — Z791 Long term (current) use of non-steroidal anti-inflammatories (NSAID): Secondary | ICD-10-CM

## 2019-11-03 DIAGNOSIS — Z89422 Acquired absence of other left toe(s): Secondary | ICD-10-CM

## 2019-11-03 DIAGNOSIS — L89152 Pressure ulcer of sacral region, stage 2: Secondary | ICD-10-CM | POA: Diagnosis present

## 2019-11-03 DIAGNOSIS — B965 Pseudomonas (aeruginosa) (mallei) (pseudomallei) as the cause of diseases classified elsewhere: Secondary | ICD-10-CM | POA: Diagnosis present

## 2019-11-03 DIAGNOSIS — N39498 Other specified urinary incontinence: Secondary | ICD-10-CM | POA: Diagnosis present

## 2019-11-03 DIAGNOSIS — Z79899 Other long term (current) drug therapy: Secondary | ICD-10-CM

## 2019-11-03 DIAGNOSIS — L89311 Pressure ulcer of right buttock, stage 1: Secondary | ICD-10-CM | POA: Diagnosis present

## 2019-11-03 DIAGNOSIS — N184 Chronic kidney disease, stage 4 (severe): Secondary | ICD-10-CM | POA: Diagnosis not present

## 2019-11-03 DIAGNOSIS — R918 Other nonspecific abnormal finding of lung field: Secondary | ICD-10-CM | POA: Diagnosis not present

## 2019-11-03 DIAGNOSIS — I2510 Atherosclerotic heart disease of native coronary artery without angina pectoris: Secondary | ICD-10-CM | POA: Diagnosis present

## 2019-11-03 DIAGNOSIS — Z20822 Contact with and (suspected) exposure to covid-19: Secondary | ICD-10-CM | POA: Diagnosis not present

## 2019-11-03 DIAGNOSIS — N3000 Acute cystitis without hematuria: Secondary | ICD-10-CM

## 2019-11-03 DIAGNOSIS — N390 Urinary tract infection, site not specified: Principal | ICD-10-CM

## 2019-11-03 DIAGNOSIS — L89321 Pressure ulcer of left buttock, stage 1: Secondary | ICD-10-CM | POA: Diagnosis present

## 2019-11-03 DIAGNOSIS — E1151 Type 2 diabetes mellitus with diabetic peripheral angiopathy without gangrene: Secondary | ICD-10-CM | POA: Diagnosis present

## 2019-11-03 DIAGNOSIS — L97519 Non-pressure chronic ulcer of other part of right foot with unspecified severity: Secondary | ICD-10-CM | POA: Diagnosis present

## 2019-11-03 DIAGNOSIS — E785 Hyperlipidemia, unspecified: Secondary | ICD-10-CM | POA: Diagnosis present

## 2019-11-03 DIAGNOSIS — R531 Weakness: Secondary | ICD-10-CM

## 2019-11-03 DIAGNOSIS — F17220 Nicotine dependence, chewing tobacco, uncomplicated: Secondary | ICD-10-CM | POA: Diagnosis present

## 2019-11-03 DIAGNOSIS — I129 Hypertensive chronic kidney disease with stage 1 through stage 4 chronic kidney disease, or unspecified chronic kidney disease: Secondary | ICD-10-CM | POA: Diagnosis present

## 2019-11-03 DIAGNOSIS — E119 Type 2 diabetes mellitus without complications: Secondary | ICD-10-CM

## 2019-11-03 DIAGNOSIS — G40909 Epilepsy, unspecified, not intractable, without status epilepticus: Secondary | ICD-10-CM | POA: Diagnosis present

## 2019-11-03 DIAGNOSIS — Z7902 Long term (current) use of antithrombotics/antiplatelets: Secondary | ICD-10-CM | POA: Diagnosis not present

## 2019-11-03 DIAGNOSIS — I252 Old myocardial infarction: Secondary | ICD-10-CM

## 2019-11-03 DIAGNOSIS — Z951 Presence of aortocoronary bypass graft: Secondary | ICD-10-CM

## 2019-11-03 DIAGNOSIS — Z8673 Personal history of transient ischemic attack (TIA), and cerebral infarction without residual deficits: Secondary | ICD-10-CM

## 2019-11-03 DIAGNOSIS — R509 Fever, unspecified: Secondary | ICD-10-CM | POA: Diagnosis not present

## 2019-11-03 DIAGNOSIS — N401 Enlarged prostate with lower urinary tract symptoms: Secondary | ICD-10-CM | POA: Diagnosis present

## 2019-11-03 DIAGNOSIS — M2550 Pain in unspecified joint: Secondary | ICD-10-CM | POA: Diagnosis not present

## 2019-11-03 LAB — CBC
HCT: 30.8 % — ABNORMAL LOW (ref 39.0–52.0)
Hemoglobin: 10.5 g/dL — ABNORMAL LOW (ref 13.0–17.0)
MCH: 29 pg (ref 26.0–34.0)
MCHC: 34.1 g/dL (ref 30.0–36.0)
MCV: 85.1 fL (ref 80.0–100.0)
Platelets: 281 10*3/uL (ref 150–400)
RBC: 3.62 MIL/uL — ABNORMAL LOW (ref 4.22–5.81)
RDW: 16 % — ABNORMAL HIGH (ref 11.5–15.5)
WBC: 6.4 10*3/uL (ref 4.0–10.5)
nRBC: 0 % (ref 0.0–0.2)

## 2019-11-03 LAB — COMPREHENSIVE METABOLIC PANEL
ALT: 10 U/L (ref 0–44)
AST: 14 U/L — ABNORMAL LOW (ref 15–41)
Albumin: 3 g/dL — ABNORMAL LOW (ref 3.5–5.0)
Alkaline Phosphatase: 77 U/L (ref 38–126)
Anion gap: 10 (ref 5–15)
BUN: 37 mg/dL — ABNORMAL HIGH (ref 8–23)
CO2: 21 mmol/L — ABNORMAL LOW (ref 22–32)
Calcium: 9.1 mg/dL (ref 8.9–10.3)
Chloride: 103 mmol/L (ref 98–111)
Creatinine, Ser: 2.34 mg/dL — ABNORMAL HIGH (ref 0.61–1.24)
GFR calc Af Amer: 27 mL/min — ABNORMAL LOW (ref >60)
GFR calc non Af Amer: 24 mL/min — ABNORMAL LOW (ref >60)
Glucose, Bld: 227 mg/dL — ABNORMAL HIGH (ref 70–99)
Potassium: 4.9 mmol/L (ref 3.5–5.1)
Sodium: 134 mmol/L — ABNORMAL LOW (ref 135–145)
Total Bilirubin: 0.7 mg/dL (ref 0.3–1.2)
Total Protein: 6.6 g/dL (ref 6.5–8.1)

## 2019-11-03 LAB — URINALYSIS, COMPLETE (UACMP) WITH MICROSCOPIC
Bacteria, UA: NONE SEEN
Bilirubin Urine: NEGATIVE
Glucose, UA: 50 mg/dL — AB
Ketones, ur: NEGATIVE mg/dL
Nitrite: NEGATIVE
Protein, ur: 100 mg/dL — AB
Specific Gravity, Urine: 1.01 (ref 1.005–1.030)
Squamous Epithelial / HPF: NONE SEEN (ref 0–5)
WBC, UA: >50 WBC/hpf — ABNORMAL HIGH (ref 0–5)
pH: 6 (ref 5.0–8.0)

## 2019-11-03 LAB — GLUCOSE, CAPILLARY: Glucose-Capillary: 209 mg/dL — ABNORMAL HIGH (ref 70–99)

## 2019-11-03 MED ORDER — SODIUM CHLORIDE 0.9 % IV SOLN
1.0000 g | Freq: Once | INTRAVENOUS | Status: AC
  Administered 2019-11-03: 1 g via INTRAVENOUS
  Filled 2019-11-03: qty 10

## 2019-11-03 MED ORDER — SODIUM CHLORIDE 0.9 % IV SOLN
500.0000 mg | Freq: Once | INTRAVENOUS | Status: AC
  Administered 2019-11-03: 500 mg via INTRAVENOUS
  Filled 2019-11-03: qty 500

## 2019-11-03 NOTE — ED Triage Notes (Signed)
Daughter brought pt in for low grades temps of 99 range.  She reports cloudy urine this AM and has not been acting right.  Been drowsy compared to normal.  No hx dementia. Also has known pressure ulcer to right heel that is being treated.  Decreased strength per daughter, seems weaker than normal.

## 2019-11-03 NOTE — ED Provider Notes (Signed)
[facility] Emergency Department Provider Note  ____________________________________________   I have reviewed the triage vital signs and the nursing notes.   HISTORY  Chief Complaint Fever and Altered Mental Status   History limited by and level 5 caveat due to: AMS, history obtained from family  HPI Matthew Bruce is a 86 y.o. male who presents to the emergency department today because of concerns for low-grade fever and weakness. Family states he first noticed a low-grade fever yesterday. They also noticed patient started becoming much weaker and slightly altered. Today he became a lot weaker and they were having a hard time moving him. They did notice today when he urinated that the urine was quite cloudy. He does have a history of urinary tract infections. Additionally they have noticed that he has had a slight cough and initially were worried that he might be coming down with a cold.   Records reviewed. Per medical record review patient has a history of UTI  Past Medical History:  Diagnosis Date  . Anemia   . Aortic valve disorder   . Coronary artery disease   . Diabetes mellitus without complication ([place])   . Glaucoma   . History of BPH   . History of kidney stones   . HOH (hard of hearing)    Bilateral Hearing Aids  . Hyperlipidemia   . Hypertension   . Kidney stones   . MRSA (methicillin resistant staph aureus) culture positive 04/01/2016   LEFT FOOT  . Myocardial infarction ([place])   . Neuropathy   . Peripheral vascular disease [facility])     Patient Active Problem List   Diagnosis Date Noted  . Sepsis ([place]) 07/03/2018  . Seizure ([place]) 09/07/2016  . Pressure injury of skin 05/27/2016  . Elevated lactic acid level 05/26/2016  . Weakness 05/26/2016  . PAD (peripheral artery disease) ([place]) 03/24/2016  . Foot ulceration, left, with necrosis of muscle ([place]) 03/24/2016  . Essential hypertension 02/02/2016  . Diabetes ([place]) 02/02/2016  .  Atherosclerotic peripheral vascular disease with ulceration ([place]) 02/02/2016  . Valvular heart disease 02/02/2016  . Embolic stroke involving right cerebellar artery ([place]) 11/10/2014  . Hypoglycemia 11/05/2014    Past Surgical History:  Procedure Laterality Date  . AMPUTATION TOE Left 02/11/2016   Procedure: AMPUTATION TOE;  Surgeon: Albertine Patricia, DPM;  Location: [facility] ORS;  Service: Podiatry;  Laterality: Left;  . AMPUTATION TOE Left 08/19/2016   Procedure: AMPUTATION TOE;  Surgeon: Albertine Patricia, DPM;  Location: [facility] ORS;  Service: Podiatry;  Laterality: Left;  . APLIGRAFT PLACEMENT Right 08/19/2016   Procedure: APLIGRAFT PLACEMENT;  Surgeon: Albertine Patricia, DPM;  Location: [facility] OR[facility];  Service: Podiatry;  Laterality: Right;  . APPLICATION OF WOUND VAC Left 04/01/2016   Procedure: APPLICATION OF WOUND VAC;  Surgeon: Albertine Patricia, DPM;  Location: [facility] ORS;  Service: Podiatry;  Laterality: Left;  . CARDIAC CATHETERIZATION    . CARDIAC SURGERY    . CARDIAC VALVE REPLACEMENT    . CORONARY ARTERY BYPASS GRAFT    . CYSTOSCOPY W/ RETROGRADES Right 07/04/2018   Procedure: CYSTOSCOPY WITH RETROGRADE PYELOGRAM;  Surgeon: Billey Co, MD;  Location: [facility] OR[facility];  Service: Urology;  Laterality: Right;  . CYSTOSCOPY WITH STENT PLACEMENT Right 07/04/2018   Procedure: CYSTOSCOPY WITH STENT PLACEMENT;  Surgeon: Billey Co, MD;  Location: [facility];  Service: Urology;  Laterality: Right;  . CYSTOSCOPY/URETEROSCOPY/HOLMIUM LASER/STENT PLACEMENT Right 09/10/2018   Procedure: CYSTOSCOPY/URETEROSCOPY/HOLMIUM LASER/STENT PLACEMENT;  Surgeon: Billey Co, MD;  Location: [facility];  Service: Urology;  Laterality: Right;  . INCISION AND DRAINAGE Right 08/19/2016   Procedure: IRRIGATION AND DEBRIDEMENT RIGHT GREAT TOE;  Surgeon: Albertine Patricia, DPM;  Location: [facility];  Service: Podiatry;  Laterality: Right;  . IRRIGATION AND DEBRIDEMENT FOOT Left 04/01/2016   Procedure: IRRIGATION AND DEBRIDEMENT  FOOT;  Surgeon: Albertine Patricia, DPM;  Location: [facility];  Service: Podiatry;  Laterality: Left;  . IRRIGATION AND DEBRIDEMENT FOOT Left 05/13/2016   Procedure: IRRIGATION AND DEBRIDEMENT FOOT;  Surgeon: Albertine Patricia, DPM;  Location: [facility];  Service: Podiatry;  Laterality: Left;  . LOWER EXTREMITY ANGIOGRAPHY Right 07/06/2016   Procedure: Lower Extremity Angiography;  Surgeon: Algernon Huxley, MD;  Location: [place] [facility];  Service: Cardiovascular;  Laterality: Right;  . LOWER EXTREMITY ANGIOGRAPHY Right 07/18/2016   Procedure: Lower Extremity Angiography;  Surgeon: Algernon Huxley, MD;  Location: [place] [facility];  Service: Cardiovascular;  Laterality: Right;  . OSTECTOMY Left 05/13/2016   Procedure: OSTECTOMY;  Surgeon: Albertine Patricia, DPM;  Location: [facility];  Service: Podiatry;  Laterality: Left;  . PERIPHERAL VASCULAR CATHETERIZATION Left 02/03/2016   Procedure: Lower Extremity Angiography;  Surgeon: Algernon Huxley, MD;  Location: [place] [facility];  Service: Cardiovascular;  Laterality: Left;  . PERIPHERAL VASCULAR CATHETERIZATION Left 04/01/2016   Procedure: Lower Extremity Angiography;  Surgeon: Katha Cabal, MD;  Location: [place] [facility];  Service: Cardiovascular;  Laterality: Left;  Marland Kitchen VALVE REPLACEMENT  2007   Aortic Valve Replacement, St. Jude Porcine Valve    Prior to Admission medications   Medication Sig Start Date End Date Taking? Authorizing Provider  amLODipine (NORVASC) 5 MG tablet Take 5 mg by mouth 2 (two) times daily. 08/25/16  Yes [provider]  clopidogrel (PLAVIX) 75 MG tablet TAKE 1 TABLET BY MOUTH EVERY DAY Patient taking differently: Take 75 mg by mouth daily.  04/27/18  Yes Algernon Huxley, MD  finasteride (PROSCAR) 5 MG tablet Take 5 mg by mouth at bedtime.    Yes [provider]  glipiZIDE (GLUCOTROL) 5 MG tablet Take 5 mg by mouth daily. 08/17/16  Yes [provider]  ibuprofen (ADVIL,MOTRIN) 800 MG tablet Take 800 mg by  mouth daily.  02/22/18  Yes [provider]  latanoprost (XALATAN) 0.005 % ophthalmic solution Place 1 drop into both eyes at bedtime.   Yes [provider]  levETIRAcetam (KEPPRA) 500 MG tablet Take 1 tablet (500 mg total) by mouth 2 (two) times daily. 09/08/16  Yes Sainani, Belia Heman, MD  lovastatin (MEVACOR) 40 MG tablet Take 40 mg by mouth daily.    Yes [provider]  magnesium oxide (MAG-OX) 400 MG tablet Take 400 mg by mouth daily.   Yes [provider]  metFORMIN (GLUCOPHAGE) 1000 MG tablet Take 1,000 mg by mouth 2 (two) times a day.    Yes [provider]  tamsulosin (FLOMAX) 0.4 MG CAPS capsule Take 1 capsule (0.4 mg total) by mouth daily. 05/28/16  Yes Demetrios Loll, MD    Allergies Sulfa antibiotics and Macrobid [nitrofurantoin monohyd macro]  Family History  Problem Relation Age of Onset  . Heart attack Mother   . Alcohol abuse Father   . Throat cancer Sister   . Heart attack Brother   . Heart failure Neg Hx     Social History Social History   Tobacco Use  . Smoking status: Former Smoker    Packs/day: 1.00    Types: Cigarettes  .  Smokeless tobacco: Current User    Types: Chew  Vaping Use  . Vaping Use: Never used  Substance Use Topics  . Alcohol use: No  . Drug use: No    Review of Systems Unable to obtain secondary to AMS ____________________________________________   PHYSICAL EXAM:  VITAL SIGNS: ED Triage Vitals  Enc Vitals Group     BP 11/03/19 1450 105/67     Pulse Rate 11/03/19 1450 90     Resp 11/03/19 1450 18     Temp 11/03/19 1450 98.6 F (37 C)     Temp Source 11/03/19 1450 Oral     SpO2 11/03/19 1450 97 %     Weight 11/03/19 1446 190 lb (86.2 kg)     Height 11/03/19 1444 6' (1.829 m)   Constitutional: Somnolent, awakens to verbal stimuli Eyes: Conjunctivae are normal.  ENT      Head: Normocephalic and atraumatic.      Nose: No congestion/rhinnorhea.      Mouth/Throat: Mucous membranes are  moist.      Neck: No stridor. Hematological/Lymphatic/Immunilogical: No cervical lymphadenopathy. Cardiovascular: Normal rate, irregular rhythm.  No murmurs, rubs, or gallops. Respiratory: Normal respiratory effort without tachypnea nor retractions. Breath sounds are clear and equal bilaterally. No wheezes/rales/rhonchi. Gastrointestinal: Soft and non tender. No rebound. No guarding.  Genitourinary: Deferred Musculoskeletal: Normal range of motion in all extremities. No lower extremity edema. Neurologic:  Somnolent. Awakens to verbal stimuli. Not oriented. Skin:  Skin is warm, dry and intact. No rash noted. ____________________________________________    LABS (pertinent positives/negatives)  CBC wbc 6.4, hgb 10.5, plt 281 CMP na 134, k 4.9, glu 227, cr 2.34 UA moderate leukocytes, 21-50 rbc, >50 wbc, wbc clumps present ____________________________________________   EKG  I, Nance Pear, attending physician, personally viewed and interpreted this EKG  EKG Time: 1454 Rate: 90 Rhythm: atrial fibrillation Axis: left axis deviation Intervals: qtc 437 QRS: narrow, q waves v1, III ST changes: no st elevation Impression: abnormal ekg  ____________________________________________    RADIOLOGY  CXR Persistent mass in right upper lung. New opacities concerning for edema vs atypical infection.  ____________________________________________   PROCEDURES  Procedures  ____________________________________________   INITIAL IMPRESSION / ASSESSMENT AND PLAN / ED COURSE  Pertinent labs & imaging results that were available during my care of the patient were reviewed by me and considered in my medical decision making (see chart for details).   Patient brought to the emergency department today with family because of concerns for low-grade fever as well as weakness and altered mental status.  On exam patient was afebrile here.  Patient is somewhat somnolent although awakens easily  to verbal stimuli.  Work-up here is concerning for urinary tract infection.  Did obtain a chest x-ray given family's concern for cough and this showed persistent mass in the right upper lobe as well as possible atypical pneumonia.  I discussed both these findings with the patient's family.  It does appear she was unaware of the mass although this time she states she is not sure she would like further work-up.  Will start antibiotics and plan on admission.  ____________________________________________   FINAL CLINICAL IMPRESSION(S) / ED DIAGNOSES  Final diagnoses:  Lower urinary tract infectious disease  Weakness  Pneumonia due to infectious organism, unspecified laterality, unspecified part of lung     Note: This dictation was prepared with Dragon dictation. Any transcriptional errors that result from this process are unintentional     Nance Pear, MD 11/03/19 2244

## 2019-11-03 NOTE — ED Notes (Signed)
Daughter [place] number [phone]

## 2019-11-03 NOTE — H&P (Signed)
History and Physical    Matthew Bruce WCB:[id number] DOB: 04/11/1926 DOA: 11/03/2019  PCP: [name], MD   Patient coming from: Home   Chief Complaint: Lethargy, urine cloudy and malodorous   HPI: Matthew Bruce is a 86 y.o. male with medical history significant for history of CVA, type 2 diabetes mellitus, BPH, seizure disorder, right heel ulcer, and chronic kidney disease stage IV, now presenting to the emergency department for evaluation of lethargy to her bed and malodorous urine.  He is accompanied by his daughter who provides most of the history.  Patient has reportedly been generally weak and lethargic, not speaking as much as usual, and daughter noted that his urine appeared very cloudy and almost like pus.  Patient had not been complaining of anything, has not appeared short of breath, and has not had any vomiting or diarrhea.  Temperature was reportedly 99 F at home.  He was initially seen at urgent care but directed to the ED.  ED Course: Upon arrival to the ED, patient is found to be afebrile, saturating well on room air, normal respiratory rate and heart rate, and blood pressure 139/78.  EKG appears to features sinus rhythm with LAD.  Chest x-ray with persistent masslike opacity in the right upper lobe, mild vascular congestion, and subtle opacities bilaterally that could reflect atypical infection or edema.  Chemistry panel notable for glucose of 227 and creatinine of 2.34.  CBC with hemoglobin of 10.5.  Urine compatible with possible infection.  Urine was sent for culture and the patient was started on antibiotics in the ED.  COVID-19 PCR not yet resulted.  Review of Systems:  All other systems reviewed and apart from HPI, are negative.  Past Medical History:  Diagnosis Date  . Anemia   . Aortic valve disorder   . Coronary artery disease   . Diabetes mellitus without complication ([place])   . Glaucoma   . History of BPH   . History of kidney stones   . HOH (hard of  hearing)    Bilateral Hearing Aids  . Hyperlipidemia   . Hypertension   . Kidney stones   . MRSA (methicillin resistant staph aureus) culture positive 04/01/2016   LEFT FOOT  . Myocardial infarction ([place])   . Neuropathy   . Peripheral vascular disease [facility])     Past Surgical History:  Procedure Laterality Date  . AMPUTATION TOE Left 02/11/2016   Procedure: AMPUTATION TOE;  Surgeon: Albertine Patricia, DPM;  Location: [facility];  Service: Podiatry;  Laterality: Left;  . AMPUTATION TOE Left 08/19/2016   Procedure: AMPUTATION TOE;  Surgeon: Albertine Patricia, DPM;  Location: [facility];  Service: Podiatry;  Laterality: Left;  . APLIGRAFT PLACEMENT Right 08/19/2016   Procedure: APLIGRAFT PLACEMENT;  Surgeon: Albertine Patricia, DPM;  Location: [facility];  Service: Podiatry;  Laterality: Right;  . APPLICATION OF WOUND VAC Left 04/01/2016   Procedure: APPLICATION OF WOUND VAC;  Surgeon: Albertine Patricia, DPM;  Location: [facility];  Service: Podiatry;  Laterality: Left;  . CARDIAC CATHETERIZATION    . CARDIAC SURGERY    . CARDIAC VALVE REPLACEMENT    . CORONARY ARTERY BYPASS GRAFT    . CYSTOSCOPY W/ RETROGRADES Right 07/04/2018   Procedure: CYSTOSCOPY WITH RETROGRADE PYELOGRAM;  Surgeon: Billey Co, MD;  Location: [facility];  Service: Urology;  Laterality: Right;  . CYSTOSCOPY WITH STENT PLACEMENT Right 07/04/2018   Procedure: CYSTOSCOPY WITH STENT PLACEMENT;  Surgeon: Billey Co, MD;  Location: [facility];  Service: Urology;  Laterality: Right;  . CYSTOSCOPY/URETEROSCOPY/HOLMIUM LASER/STENT PLACEMENT Right 09/10/2018   Procedure: CYSTOSCOPY/URETEROSCOPY/HOLMIUM LASER/STENT PLACEMENT;  Surgeon: Billey Co, MD;  Location: [facility];  Service: Urology;  Laterality: Right;  . INCISION AND DRAINAGE Right 08/19/2016   Procedure: IRRIGATION AND DEBRIDEMENT RIGHT GREAT TOE;  Surgeon: Albertine Patricia, DPM;  Location: [facility];  Service: Podiatry;  Laterality: Right;  . IRRIGATION AND DEBRIDEMENT FOOT  Left 04/01/2016   Procedure: IRRIGATION AND DEBRIDEMENT FOOT;  Surgeon: Albertine Patricia, DPM;  Location: [facility];  Service: Podiatry;  Laterality: Left;  . IRRIGATION AND DEBRIDEMENT FOOT Left 05/13/2016   Procedure: IRRIGATION AND DEBRIDEMENT FOOT;  Surgeon: Albertine Patricia, DPM;  Location: [facility];  Service: Podiatry;  Laterality: Left;  . LOWER EXTREMITY ANGIOGRAPHY Right 07/06/2016   Procedure: Lower Extremity Angiography;  Surgeon: Algernon Huxley, MD;  Location: [place] [facility];  Service: Cardiovascular;  Laterality: Right;  . LOWER EXTREMITY ANGIOGRAPHY Right 07/18/2016   Procedure: Lower Extremity Angiography;  Surgeon: Algernon Huxley, MD;  Location: [place] [facility];  Service: Cardiovascular;  Laterality: Right;  . OSTECTOMY Left 05/13/2016   Procedure: OSTECTOMY;  Surgeon: Albertine Patricia, DPM;  Location: [facility];  Service: Podiatry;  Laterality: Left;  . PERIPHERAL VASCULAR CATHETERIZATION Left 02/03/2016   Procedure: Lower Extremity Angiography;  Surgeon: Algernon Huxley, MD;  Location: [place] [facility];  Service: Cardiovascular;  Laterality: Left;  . PERIPHERAL VASCULAR CATHETERIZATION Left 04/01/2016   Procedure: Lower Extremity Angiography;  Surgeon: Katha Cabal, MD;  Location: [place] [facility];  Service: Cardiovascular;  Laterality: Left;  Marland Kitchen VALVE REPLACEMENT  2007   Aortic Valve Replacement, St. Jude Porcine Valve    Social History:   reports that he has quit smoking. His smoking use included cigarettes. He smoked 1.00 pack per day. His smokeless tobacco use includes chew. He reports that he does not drink alcohol and does not use drugs.  Allergies  Allergen Reactions  . Sulfa Antibiotics Other (See Comments)    Reaction: Severe Shaking, Weakness  . Macrobid [Nitrofurantoin Monohyd Macro] Diarrhea    Family History  Problem Relation Age of Onset  . Heart attack Mother   . Alcohol abuse Father   . Throat cancer Sister   . Heart attack Brother   . Heart failure  Neg Hx      Prior to Admission medications   Medication Sig Start Date End Date Taking? Authorizing Provider  amLODipine (NORVASC) 5 MG tablet Take 5 mg by mouth 2 (two) times daily. 08/25/16  Yes [provider]  clopidogrel (PLAVIX) 75 MG tablet TAKE 1 TABLET BY MOUTH EVERY DAY Patient taking differently: Take 75 mg by mouth daily.  04/27/18  Yes Algernon Huxley, MD  finasteride (PROSCAR) 5 MG tablet Take 5 mg by mouth at bedtime.    Yes [provider]  glipiZIDE (GLUCOTROL) 5 MG tablet Take 5 mg by mouth daily. 08/17/16  Yes [provider]  ibuprofen (ADVIL,MOTRIN) 800 MG tablet Take 800 mg by mouth daily.  02/22/18  Yes [provider]  latanoprost (XALATAN) 0.005 % ophthalmic solution Place 1 drop into both eyes at bedtime.   Yes [provider]  levETIRAcetam (KEPPRA) 500 MG tablet Take 1 tablet (500 mg total) by mouth 2 (two) times daily. 09/08/16  Yes Sainani, Belia Heman, MD  lovastatin (MEVACOR) 40 MG tablet Take 40 mg by mouth daily.    Yes [provider]  magnesium oxide (MAG-OX) 400 MG tablet  Take 400 mg by mouth daily.   Yes [provider]  metFORMIN (GLUCOPHAGE) 1000 MG tablet Take 1,000 mg by mouth 2 (two) times a day.    Yes [provider]  tamsulosin (FLOMAX) 0.4 MG CAPS capsule Take 1 capsule (0.4 mg total) by mouth daily. 05/28/16  Yes Demetrios Loll, MD    Physical Exam: Vitals:   11/03/19 1930 11/03/19 2000 11/03/19 2100 11/03/19 2200  BP:  (!) 112/53 (!) 120/63 (!) 139/78  Pulse: 72 71 64 60  Resp: 21 20    Temp:      TempSrc:      SpO2: 95% 97% 95% 97%  Weight:      Height:        Constitutional: NAD, calm  Eyes: PERTLA, lids and conjunctivae normal ENMT: Mucous membranes are moist. Posterior pharynx clear of any exudate or lesions.   Neck: normal, supple, no masses, no thyromegaly Respiratory:  no wheezing, no crackles. No accessory muscle use.  Cardiovascular: S1 & S2 heard, regular rate and  rhythm. Right pedal edema. Abdomen: No distension, no tenderness, soft. Bowel sounds active.  Musculoskeletal: no clubbing / cyanosis. No joint deformity upper and lower extremities.   Skin: Eschar at right heel. Warm, dry, well-perfused. Neurologic: CN 2-12 grossly intact. Sensation intact. Moving all extremities.  Psychiatric: Sleeping, wakes to voice and smiles, but only answers some basic yes/no questions.    Labs and Imaging on Admission: I have personally reviewed following labs and imaging studies  CBC: Recent Labs  Lab 11/03/19 1451  WBC 6.4  HGB 10.5*  HCT 30.8*  MCV 85.1  PLT 810   Basic Metabolic Panel: Recent Labs  Lab 11/03/19 1451  NA 134*  K 4.9  CL 103  CO2 21*  GLUCOSE 227*  BUN 37*  CREATININE 2.34*  CALCIUM 9.1   GFR: Estimated Creatinine Clearance: 23 mL/min (A) (by C-G formula based on SCr of 2.34 mg/dL (H)). Liver Function Tests: Recent Labs  Lab 11/03/19 1451  AST 14*  ALT 10  ALKPHOS 77  BILITOT 0.7  PROT 6.6  ALBUMIN 3.0*   No results for input(s): LIPASE, AMYLASE in the last 168 hours. No results for input(s): AMMONIA in the last 168 hours. Coagulation Profile: No results for input(s): INR, PROTIME in the last 168 hours. Cardiac Enzymes: No results for input(s): CKTOTAL, CKMB, CKMBINDEX, TROPONINI in the last 168 hours. BNP (last 3 results) No results for input(s): PROBNP in the last 8760 hours. HbA1C: No results for input(s): HGBA1C in the last 72 hours. CBG: Recent Labs  Lab 11/03/19 1455  GLUCAP 209*   Lipid Profile: No results for input(s): CHOL, HDL, LDLCALC, TRIG, CHOLHDL, LDLDIRECT in the last 72 hours. Thyroid Function Tests: No results for input(s): TSH, T4TOTAL, FREET4, T3FREE, THYROIDAB in the last 72 hours. Anemia Panel: No results for input(s): VITAMINB12, FOLATE, FERRITIN, TIBC, IRON, RETICCTPCT in the last 72 hours. Urine analysis:    Component Value Date/Time   COLORURINE YELLOW (A) 11/03/2019 1929    APPEARANCEUR TURBID (A) 11/03/2019 1929   LABSPEC 1.010 11/03/2019 1929   PHURINE 6.0 11/03/2019 1929   GLUCOSEU 50 (A) 11/03/2019 1929   HGBUR MODERATE (A) 11/03/2019 [place] NEGATIVE 11/03/2019 [place] NEGATIVE 11/03/2019 1929   PROTEINUR 100 (A) 11/03/2019 1929   NITRITE NEGATIVE 11/03/2019 1929   LEUKOCYTESUR MODERATE (A) 11/03/2019 1929   Sepsis Labs: @LABRCNTIP (procalcitonin:4,lacticidven:4) )No results found for this or any previous visit (from the past 240 hour(s)).  Radiological Exams on Admission: DG Chest Portable 1 View  Result Date: 11/03/2019 CLINICAL DATA:  Cough, low-grade fever, cloudy urine EXAM: PORTABLE CHEST 1 VIEW COMPARISON:  Radiograph 07/03/2018, CT 05/26/2016 FINDINGS: Redemonstration of a masslike opacity in the right upper lobe, with some increasing architectural distortion of the right upper lobe as well. Suspect some degree of volume loss with retraction of the fissure towards the right lung apex. There is a background of additional heterogeneous peripheral predominant opacities in both lungs with some mild central vascular congestion. Cardiac silhouette is within normal limits for size demonstrating features of prior sternotomy and CABG with a calcified, tortuous aorta. The osseous structures appear diffusely demineralized which may limit detection of small or nondisplaced fractures. No acute osseous abnormality or suspicious osseous lesion. Degenerative changes are present in the imaged spine and shoulders. Mild edematous changes in the soft tissues. IMPRESSION: 1. Persistent masslike opacity in the right upper lobe with increasing architectural distortion as well as possible volume loss with retraction of the fissure towards the right lung apex. Appearance remains concerning for possible malignancy. 2. Additional heterogeneous peripheral predominant opacities in both lungs with mild central vascular congestion. Findings could reflect superimposed  edema or atypical infection in the setting of low-grade fever. 3.  Aortic Atherosclerosis (ICD10-I70.0). Electronically Signed   By: Lovena Le M.D.   On: 11/03/2019 21:50    EKG: Independently reviewed. Sinus rhythm, LAD.   Assessment/Plan   1. Acute UTI  - Brought in by daughter he reports that he has been lethargic and urine was turbid and malodorous  - He is not septic, does not have any SIRS criteria  - Urine was cultured in ED and Rocephin started  - Continue Rocephin, follow culture and clinical course    2. Right foot ulcer  - Followed by podiatry and status-post recent debridement  - Continue wound care    3. Type II DM  - Check CBGs and use low-intensity SSI for now    4. Lung mass  - RUL mass noted on CXR, daughter at bedside reports learning of this a couple years ago and did not think the patient would want any further workup for it    5. CKD IV  - SCr is 2.34 on admission, similar to priors  - Renally-dose medications, monitor   6. Hypertension  - BP at goal, continue Norvasc   7. ?Pneumonia  - There was question of possible pneumonia on CXR but patient not coughing and not tachypneic or hypoxic - He was given Rocephin and azithromycin in ED, COVID pcr pending  - Continue Rocephin only for now, repeat CXR and/or resume azithromycin if cough or SOB develops     DVT prophylaxis: sq heparin  Code Status: Full, confirmed with daughter at bedside  Family Communication: Daughter updated at bedside Disposition Plan:  Patient is from: Home  Anticipated d/c is to: TBD Anticipated d/c date is: 2-3 days  Patient currently: Pending cultures  Consults called: None  Admission status: Inpatient     Vianne Bulls, MD [facility]  11/03/2019, 11:09 PM

## 2019-11-04 LAB — BASIC METABOLIC PANEL
Anion gap: 11 (ref 5–15)
BUN: 41 mg/dL — ABNORMAL HIGH (ref 8–23)
CO2: 18 mmol/L — ABNORMAL LOW (ref 22–32)
Calcium: 8.9 mg/dL (ref 8.9–10.3)
Chloride: 107 mmol/L (ref 98–111)
Creatinine, Ser: 2.32 mg/dL — ABNORMAL HIGH (ref 0.61–1.24)
GFR calc Af Amer: 28 mL/min — ABNORMAL LOW (ref >60)
GFR calc non Af Amer: 24 mL/min — ABNORMAL LOW (ref >60)
Glucose, Bld: 196 mg/dL — ABNORMAL HIGH (ref 70–99)
Potassium: 4.6 mmol/L (ref 3.5–5.1)
Sodium: 136 mmol/L (ref 135–145)

## 2019-11-04 LAB — CBC
HCT: 28.9 % — ABNORMAL LOW (ref 39.0–52.0)
Hemoglobin: 9.4 g/dL — ABNORMAL LOW (ref 13.0–17.0)
MCH: 28.2 pg (ref 26.0–34.0)
MCHC: 32.5 g/dL (ref 30.0–36.0)
MCV: 86.8 fL (ref 80.0–100.0)
Platelets: 227 10*3/uL (ref 150–400)
RBC: 3.33 MIL/uL — ABNORMAL LOW (ref 4.22–5.81)
RDW: 15.7 % — ABNORMAL HIGH (ref 11.5–15.5)
WBC: 4.4 10*3/uL (ref 4.0–10.5)
nRBC: 0 % (ref 0.0–0.2)

## 2019-11-04 LAB — GLUCOSE, CAPILLARY
Glucose-Capillary: 168 mg/dL — ABNORMAL HIGH (ref 70–99)
Glucose-Capillary: 247 mg/dL — ABNORMAL HIGH (ref 70–99)
Glucose-Capillary: 206 mg/dL — ABNORMAL HIGH (ref 70–99)

## 2019-11-04 LAB — HEMOGLOBIN A1C
Hgb A1c MFr Bld: 6.9 % — ABNORMAL HIGH (ref 4.8–5.6)
Mean Plasma Glucose: 151.33 mg/dL

## 2019-11-04 LAB — SARS CORONAVIRUS 2 BY RT PCR (HOSPITAL ORDER, PERFORMED IN ~~LOC~~ HOSPITAL LAB): SARS Coronavirus 2: NEGATIVE

## 2019-11-04 MED ORDER — INSULIN ASPART 100 UNIT/ML ~~LOC~~ SOLN
0.0000 [IU] | Freq: Every day | SUBCUTANEOUS | Status: DC
  Administered 2019-11-04: 2 [IU] via SUBCUTANEOUS
  Filled 2019-11-04: qty 1

## 2019-11-04 MED ORDER — ONDANSETRON HCL 4 MG/2ML IJ SOLN: 4.0000 mg | Freq: Four times a day (QID) | INTRAMUSCULAR | Status: DC | PRN

## 2019-11-04 MED ORDER — SODIUM CHLORIDE 0.9 % IV SOLN: 250.0000 mL | INTRAVENOUS | Status: DC | PRN

## 2019-11-04 MED ORDER — CLOPIDOGREL BISULFATE 75 MG PO TABS
75.0000 mg | ORAL_TABLET | Freq: Every day | ORAL | Status: DC
  Administered 2019-11-04 – 2019-11-05 (×2): 75 mg via ORAL
  Filled 2019-11-04 (×2): qty 1

## 2019-11-04 MED ORDER — ACETAMINOPHEN 650 MG RE SUPP: 650.0000 mg | Freq: Four times a day (QID) | RECTAL | Status: DC | PRN

## 2019-11-04 MED ORDER — SODIUM CHLORIDE 0.9 % IV SOLN
1.0000 g | INTRAVENOUS | Status: DC
  Administered 2019-11-04: 23:00:00 1 g via INTRAVENOUS
  Filled 2019-11-04: qty 10
  Filled 2019-11-04: qty 1

## 2019-11-04 MED ORDER — LATANOPROST 0.005 % OP SOLN
1.0000 [drp] | Freq: Every day | OPHTHALMIC | Status: DC
  Filled 2019-11-04 (×3): qty 2.5

## 2019-11-04 MED ORDER — ACETAMINOPHEN 325 MG PO TABS: 650.0000 mg | ORAL_TABLET | Freq: Four times a day (QID) | ORAL | Status: DC | PRN

## 2019-11-04 MED ORDER — INSULIN ASPART 100 UNIT/ML ~~LOC~~ SOLN
0.0000 [IU] | Freq: Three times a day (TID) | SUBCUTANEOUS | Status: DC
  Administered 2019-11-04: 2 [IU] via SUBCUTANEOUS
  Administered 2019-11-04: 17:00:00 3 [IU] via SUBCUTANEOUS
  Administered 2019-11-05: 09:00:00 1 [IU] via SUBCUTANEOUS
  Administered 2019-11-05 (×2): 2 [IU] via SUBCUTANEOUS
  Filled 2019-11-04 (×5): qty 1

## 2019-11-04 MED ORDER — FINASTERIDE 5 MG PO TABS
5.0000 mg | ORAL_TABLET | Freq: Every day | ORAL | Status: DC
  Administered 2019-11-04: 5 mg via ORAL
  Filled 2019-11-04 (×2): qty 1

## 2019-11-04 MED ORDER — LEVETIRACETAM 500 MG PO TABS
500.0000 mg | ORAL_TABLET | Freq: Two times a day (BID) | ORAL | Status: DC
  Administered 2019-11-04 – 2019-11-05 (×3): 500 mg via ORAL
  Filled 2019-11-04 (×5): qty 1

## 2019-11-04 MED ORDER — SODIUM CHLORIDE 0.9 % IV SOLN: INTRAVENOUS | Status: AC

## 2019-11-04 MED ORDER — PRAVASTATIN SODIUM 20 MG PO TABS
10.0000 mg | ORAL_TABLET | Freq: Every day | ORAL | Status: DC
  Administered 2019-11-04 – 2019-11-05 (×2): 10 mg via ORAL
  Filled 2019-11-04 (×3): qty 1

## 2019-11-04 MED ORDER — TAMSULOSIN HCL 0.4 MG PO CAPS
0.4000 mg | ORAL_CAPSULE | Freq: Every day | ORAL | Status: DC
  Administered 2019-11-04 – 2019-11-05 (×2): 0.4 mg via ORAL
  Filled 2019-11-04 (×2): qty 1

## 2019-11-04 MED ORDER — HEPARIN SODIUM (PORCINE) 5000 UNIT/ML IJ SOLN
5000.0000 [IU] | Freq: Three times a day (TID) | INTRAMUSCULAR | Status: DC
  Administered 2019-11-04 – 2019-11-05 (×4): 5000 [IU] via SUBCUTANEOUS
  Filled 2019-11-04 (×4): qty 1

## 2019-11-04 MED ORDER — AMLODIPINE BESYLATE 5 MG PO TABS
5.0000 mg | ORAL_TABLET | Freq: Two times a day (BID) | ORAL | Status: DC
  Administered 2019-11-04 – 2019-11-05 (×3): 5 mg via ORAL
  Filled 2019-11-04 (×3): qty 1

## 2019-11-04 MED ORDER — ONDANSETRON HCL 4 MG PO TABS: 4.0000 mg | ORAL_TABLET | Freq: Four times a day (QID) | ORAL | Status: DC | PRN

## 2019-11-04 MED ORDER — SODIUM CHLORIDE 0.9% FLUSH
3.0000 mL | Freq: Two times a day (BID) | INTRAVENOUS | Status: DC
  Administered 2019-11-04: 3 mL via INTRAVENOUS

## 2019-11-04 MED ORDER — SENNOSIDES-DOCUSATE SODIUM 8.6-50 MG PO TABS: 1.0000 | ORAL_TABLET | Freq: Every evening | ORAL | Status: DC | PRN

## 2019-11-04 MED ORDER — SODIUM CHLORIDE 0.9% FLUSH: 3.0000 mL | INTRAVENOUS | Status: DC | PRN

## 2019-11-04 NOTE — Progress Notes (Signed)
[place] [place] NAME: Matthew Bruce    MR#:  [id number]  DATE OF BIRTH:  January 24, 1927  SUBJECTIVE:  patient is hard on hearing. Has hearing aid. Not able to give much history. Poor historian. Daughter informs about patient being brought with generalized weakness felt warm diaphoretic and very thick mucus looking urine. Ate good lunch. No fever  REVIEW OF SYSTEMS:   Review of Systems  Unable to perform ROS: Other   hard on hearing Tolerating Diet: yes Tolerating PT: most of the time bedbound per daughter  DRUG ALLERGIES:   Allergies  Allergen Reactions   Sulfa Antibiotics Other (See Comments)    Reaction: Severe Shaking, Weakness   Macrobid [Nitrofurantoin Monohyd Macro] Diarrhea    VITALS:  Blood pressure 137/63, pulse 89, temperature 98.6 F (37 C), temperature source Oral, resp. rate 18, height 6' (1.829 m), weight 86.2 kg, SpO2 96 %.  PHYSICAL EXAMINATION:   Physical Exam limited exam  GENERAL:  86 y.o.-year-old patient lying in the bed with no acute distress. Weak deconditioned   HEENT: Head atraumatic, normocephalic. Oropharynx and nasopharynx clear. Hearing aid present LUNGS: Normal breath sounds bilaterally, no wheezing, rales, rhonchi. No use of accessory muscles of respiration.  CARDIOVASCULAR: S1, S2 normal. No murmurs, rubs, or gallops.  ABDOMEN: Soft, nontender, nondistended. Bowel sounds present. No organomegaly or mass.  EXTREMITIES: No cyanosis, clubbing or edema b/l.    NEUROLOGIC: moves all extremities well.  PSYCHIATRIC:  patient is alert and awake SKIN: No obvious rash, lesion, or ulcer.  Pressure Injury 09/07/16 Stage II -  Partial thickness loss of dermis presenting as a shallow open ulcer with a red, pink wound bed without slough. Coccyx stage 2 pressure injury (Active)  09/07/16 0538  Location: Coccyx  Location Orientation: Posterior  Staging: Stage II -  Partial thickness loss of dermis  presenting as a shallow open ulcer with a red, pink wound bed without slough.  Wound Description (Comments): Coccyx stage 2 pressure injury  Present on Admission: Yes     Pressure Injury 07/03/18 Stage I -  Intact skin with non-blanchable redness of a localized area usually over a bony prominence. (Active)  07/03/18 2130  Location: Buttocks  Location Orientation: Right;Left  Staging: Stage I -  Intact skin with non-blanchable redness of a localized area usually over a bony prominence.  Wound Description (Comments):   Present on Admission: Yes       LABORATORY PANEL:  CBC Recent Labs  Lab 11/04/19 0440  WBC 4.4  HGB 9.4*  HCT 28.9*  PLT 227    Chemistries  Recent Labs  Lab 11/03/19 1451 11/03/19 1451 11/04/19 0440  NA 134*   < > 136  K 4.9   < > 4.6  CL 103   < > 107  CO2 21*   < > 18*  GLUCOSE 227*   < > 196*  BUN 37*   < > 41*  CREATININE 2.34*   < > 2.32*  CALCIUM 9.1   < > 8.9  AST 14*  --   --   ALT 10  --   --   ALKPHOS 77  --   --   BILITOT 0.7  --   --    < > = values in this interval not displayed.   Cardiac Enzymes No results for input(s): TROPONINI in the last 168 hours. RADIOLOGY:  DG Chest Portable 1 View  Result Date: 11/03/2019 CLINICAL DATA:  Cough, low-grade fever, cloudy urine EXAM: PORTABLE CHEST 1 VIEW COMPARISON:  Radiograph 07/03/2018, CT 05/26/2016 FINDINGS: Redemonstration of a masslike opacity in the right upper lobe, with some increasing architectural distortion of the right upper lobe as well. Suspect some degree of volume loss with retraction of the fissure towards the right lung apex. There is a background of additional heterogeneous peripheral predominant opacities in both lungs with some mild central vascular congestion. Cardiac silhouette is within normal limits for size demonstrating features of prior sternotomy and CABG with a calcified, tortuous aorta. The osseous structures appear diffusely demineralized which may limit detection  of small or nondisplaced fractures. No acute osseous abnormality or suspicious osseous lesion. Degenerative changes are present in the imaged spine and shoulders. Mild edematous changes in the soft tissues. IMPRESSION: 1. Persistent masslike opacity in the right upper lobe with increasing architectural distortion as well as possible volume loss with retraction of the fissure towards the right lung apex. Appearance remains concerning for possible malignancy. 2. Additional heterogeneous peripheral predominant opacities in both lungs with mild central vascular congestion. Findings could reflect superimposed edema or atypical infection in the setting of low-grade fever. 3.  Aortic Atherosclerosis (ICD10-I70.0). Electronically Signed   By: Lovena Le M.D.   On: 11/03/2019 21:50   ASSESSMENT AND PLAN:  Matthew Bruce is a 86 y.o. male with medical history significant for history of CVA, type 2 diabetes mellitus, BPH, seizure disorder, right heel ulcer, and chronic kidney disease stage IV, now presenting to the emergency department for evaluation of lethargy to her bed and malodorous urine.  He is accompanied by his daughter who provides most of the history.  Patient has reportedly been generally weak and lethargic, not speaking as much as usual, and daughter noted that his urine appeared very cloudy and almost like pus.   patient has history of incontinence and BPH.   1. Acute UTI  - Brought in by daughter he reports that he has been lethargic and urine was turbid and malodorous  - He is not septic, does not have any SIRS criteria  - Urine was cultured in ED and Rocephin started  - Continue Rocephin, follow culture and clinical course   -patient remains afebrile.  2.  acute on chronic CKD IV  secondary to poor PO intake with weakness in the setting of UTI - SCr is 2.34 on admission -baseline creatinine 1.9 - Renally-dose medications, monitor  -IV fluids -creatinine trending down to 2.32  3. Type  II DM  - use low-intensity SSI for now    4. Lung mass  - RUL mass noted on CXR, daughter at bedside reports learning of this a couple years ago and did not think the patient would want any further workup for it    5. chronic right foot ulcer  - Followed by podiatry and status-post recent debridement  - Continue wound care    6. Hypertension  - BP at goal, continue Norvasc   7. ?Pneumonia  - There was question of possible pneumonia on CXR but patient not coughing and not tachypneic or hypoxic - He was given Rocephin and azithromycin in ED, COVID pcr pending  -patient remains asymptomatic. No further workup needed at present  [facility] for discharge planning.  DVT prophylaxis: sq heparin  Code Status: Full, confirmed with daughter at bedside  Family Communication: Daughter updated at bedside Disposition Plan:  Patient is from: Home  Anticipated d/c is to: TBD Anticipated d/c date is: 1-2 days Patient currently:  Pending cultures  Consults called: None  Admission status: Inpatient          TOTAL TIME TAKING CARE OF THIS PATIENT: *25* minutes.  >50% time spent on counselling and coordination of care  Note: This dictation was prepared with Dragon dictation along with smaller phrase technology. Any transcriptional errors that result from this process are unintentional.  Fritzi Mandes M.D    [facility]   CC: Primary care physician; [name], MDPatient ID: Matthew Bruce, male   DOB: October 17, 1926, 86 y.o.   MRN: [id number]

## 2019-11-05 LAB — GLUCOSE, CAPILLARY
Glucose-Capillary: 133 mg/dL — ABNORMAL HIGH (ref 70–99)
Glucose-Capillary: 133 mg/dL — ABNORMAL HIGH (ref 70–99)
Glucose-Capillary: 158 mg/dL — ABNORMAL HIGH (ref 70–99)
Glucose-Capillary: 167 mg/dL — ABNORMAL HIGH (ref 70–99)
Glucose-Capillary: 154 mg/dL — ABNORMAL HIGH (ref 70–99)

## 2019-11-05 LAB — BASIC METABOLIC PANEL
Anion gap: 10 (ref 5–15)
BUN: 40 mg/dL — ABNORMAL HIGH (ref 8–23)
CO2: 19 mmol/L — ABNORMAL LOW (ref 22–32)
Calcium: 8.7 mg/dL — ABNORMAL LOW (ref 8.9–10.3)
Chloride: 106 mmol/L (ref 98–111)
Creatinine, Ser: 2.21 mg/dL — ABNORMAL HIGH (ref 0.61–1.24)
GFR calc Af Amer: 29 mL/min — ABNORMAL LOW (ref >60)
GFR calc non Af Amer: 25 mL/min — ABNORMAL LOW (ref >60)
Glucose, Bld: 146 mg/dL — ABNORMAL HIGH (ref 70–99)
Potassium: 3.8 mmol/L (ref 3.5–5.1)
Sodium: 135 mmol/L (ref 135–145)

## 2019-11-05 LAB — URINE CULTURE: Culture: >=100000 — AB

## 2019-11-05 MED ORDER — CEPHALEXIN 250 MG PO CAPS: 250.0000 mg | ORAL_CAPSULE | Freq: Three times a day (TID) | ORAL | 0 refills | Status: AC

## 2019-11-05 MED ORDER — CEPHALEXIN 250 MG PO CAPS
250.0000 mg | ORAL_CAPSULE | Freq: Three times a day (TID) | ORAL | Status: DC
  Administered 2019-11-05: 14:00:00 250 mg via ORAL
  Filled 2019-11-05 (×4): qty 1

## 2019-11-05 MED ORDER — COLLAGENASE 250 UNIT/GM EX OINT: TOPICAL_OINTMENT | Freq: Every day | CUTANEOUS | 0 refills | Status: AC

## 2019-11-05 MED ORDER — COLLAGENASE 250 UNIT/GM EX OINT
TOPICAL_OINTMENT | Freq: Every day | CUTANEOUS | Status: DC
  Filled 2019-11-05: qty 30

## 2019-11-05 NOTE — TOC Transition Note (Signed)
Transition of Care [facility]) - CM/SW Discharge Note   Patient Details  Name: JERAMIE SCOGIN MRN: [id number] Date of Birth: May 30, 1926  [facility]) CM/SW Contact:  Shelbie Hutching, RN Phone Number: 11/05/2019, 2:32 PM   Clinical Narrative:    Patient is medically cleared for discharge home with home health services today.  Patient will discharge via EMS transport.  Home Health services will be provided by [facility] for RN, PT, OT, aide and [place] with [facility] is aware of discharge today.    Final next level of care: [place] Barriers to Discharge: Barriers Resolved   Patient Goals and CMS Choice Patient states their goals for this hospitalization and ongoing recovery are:: For patient to get stronger and go home CMS Medicare.gov Compare Post Acute Care list provided to:: Patient Represenative (must comment) Choice offered to / list presented to : Adult Children [name]- daughter)  Discharge Placement                Patient to be transferred to facility by: Patient will transport home via EMS Name of family member notified: [facility] Patient and family notified of of transfer: 11/05/19  Discharge Plan and Services   Discharge Planning Services: CM Consult Post Acute Care Choice: [place]: [facility] Date [facility] Agency Contacted: 11/05/19 Time [place]: 1301 Representative spoke with at [place]: [place] ([place]) Interventions     Readmission Risk Interventions No flowsheet data found.

## 2019-11-05 NOTE — TOC Initial Note (Signed)
Transition of Care [facility]) - Initial/Assessment Note    Patient Details  Name: Matthew Bruce MRN: [id number] Date of Birth: [birth year]  Transition [facility] Care [facility]) CM/SW Contact:    Shelbie Hutching, RN Phone Number: 11/05/2019, 11:20 AM  Clinical Narrative:                 Patient admitted to the hospital with urinary tract infection, family has noticed that the patient has been weaker than normal.  Patient lives with his daughter Matthew Bruce and his other children also come over and help out.  Patient does not walk but is able to stand and sit in wheelchair.  Patient is incontinent and wears depends.  Per daughter the patient can feed himself.  Patient has a hospital bed, wheelchair, lift chair, and walker at home.  The family is open to home health care at discharge, they would like for him to get stronger before going home.  Family would be agreeable to SNF for rehab if they weren't worried about the facilities shutting down again because of COVID.   Patient is current with his PCP and is followed by podiatry for wound on his heel.  Daughter does report transportation is difficult with the patient's current level of function but she with the help of family have been getting him in the car and taking him to appointments.   RNCM will cont to follow and assist with discharge planning and home health services.   Expected Discharge Plan: [place] Barriers to Discharge: Continued Medical Work up   Patient Goals and CMS Choice Patient states their goals for this hospitalization and ongoing recovery are:: For patient to get stronger and go home CMS Medicare.gov Compare Post Acute Care list provided to:: Patient Represenative (must comment) Choice offered to / list presented to : Adult Children  Expected Discharge Plan and Services Expected Discharge Plan: [place]   Discharge Planning Services: CM Consult Post Acute Care Choice: [place] arrangements for  the past 2 months: Single Family Home                                      Prior Living Arrangements/Services Living arrangements for the past 2 months: Single Family Home Lives with:: Adult Children Patient language and need for interpreter reviewed:: Yes Do you feel safe going back to the place where you live?: Yes      Need for Family Participation in Patient Care: Yes (Comment) (weakness, UTI) Care giver support system in place?: Yes (comment) (daughters) Current home services: DME (hospital bed, wheelchair, lift chair, walker) Criminal Activity/Legal Involvement Pertinent to Current Situation/Hospitalization: No - Comment as needed  Activities of Daily Living Home Assistive Devices/Equipment: Wheelchair, Environmental consultant (specify type), Hospital bed, Hearing aid, Bedside commode/3-in-1 ADL Screening (condition at time of admission) Patient's cognitive ability adequate to safely complete daily activities?: Yes Is the patient deaf or have difficulty hearing?: Yes Does the patient have difficulty seeing, even when wearing glasses/contacts?: No Does the patient have difficulty concentrating, remembering, or making decisions?: No Patient able to express need for assistance with ADLs?: No Does the patient have difficulty dressing or bathing?: Yes Independently performs ADLs?: No Communication: Needs assistance Is this a change from baseline?: Pre-admission baseline Dressing (OT): Needs assistance Is this a change from baseline?: Pre-admission baseline Grooming: Needs assistance Is this a change from baseline?: Pre-admission baseline  Feeding: Needs assistance Is this a change from baseline?: Pre-admission baseline Bathing: Needs assistance Is this a change from baseline?: Pre-admission baseline Toileting: Needs assistance Is this a change from baseline?: Pre-admission baseline In/Out Bed: Needs assistance Is this a change from baseline?: Pre-admission baseline Walks in Home: Needs  assistance Is this a change from baseline?: Pre-admission baseline Does the patient have difficulty walking or climbing stairs?: Yes Weakness of Legs: Both Weakness of Arms/Hands: Both  Permission Sought/Granted Permission sought to share information with : Case Manager, Family Supports, Other (comment) Permission granted to share information with : Yes, Verbal Permission Granted  Share Information with NAME: Matthew Bruce and [name]  Permission granted to share info w AGENCY: Home Health agency of choice  Permission granted to share info w Relationship: daughters     Emotional Assessment Appearance:: Appears stated age Attitude/Demeanor/Rapport: Lethargic Affect (typically observed): Unable to Assess Orientation: : Oriented to Self, Oriented to Place, Oriented to  Time, Oriented to Situation Alcohol / Substance Use: Tobacco Use Psych Involvement: No (comment)  Admission diagnosis:  Lower urinary tract infectious disease [N39.0] UTI (urinary tract infection) [N39.0] Weakness [R53.1] Pneumonia due to infectious organism, unspecified laterality, unspecified part of lung [J18.9] Patient Active Problem List   Diagnosis Date Noted  . CKD (chronic kidney disease), stage IV ([place]) 11/03/2019  . UTI (urinary tract infection) 11/03/2019  . Lung mass 11/03/2019  . Sepsis ([place]) 07/03/2018  . Seizure ([place]) 09/07/2016  . Pressure injury of skin 05/27/2016  . Elevated lactic acid level 05/26/2016  . Weakness 05/26/2016  . PAD (peripheral artery disease) ([place]) 03/24/2016  . Right foot ulcer ([place]) 03/24/2016  . Essential hypertension 02/02/2016  . Diabetes ([place]) 02/02/2016  . Atherosclerotic peripheral vascular disease with ulceration ([place]) 02/02/2016  . Valvular heart disease 02/02/2016  . Embolic stroke involving right cerebellar artery ([place]) 11/10/2014  . Hypoglycemia 11/05/2014   PCP:  [name], MD Pharmacy:   [id number] [place], [place] [address] [place] [address] Phone: [phone] Fax: [phone]     Social Determinants of Health (SDOH) Interventions    Readmission Risk Interventions No flowsheet data found.

## 2019-11-05 NOTE — Consult Note (Signed)
WOC Nurse Consult Note: Reason for Consult: sacrum and heel  Heel pressure injury followed by Dr. Cleda Mccreedy; last seen 10/28/19: POC added for wound care per Dr. Sammuel Bailiff note below  Wound type: 1. Stage 2 Pressure Injury: coccyx: 2cm x 2cmx 0.1cm  2. Stage 2 Pressure Injury: sacrum: 1cm x 1cm x 0.1cm  3. Unstageable Pressure Injury: right heel; 6cm x 4cm x 0cm   Pressure Injury POA: Yes Measurement: see above  Wound bed: 1. Clean and pink 2. Clean and pink 3. 100% eschar Drainage (amount, consistency, odor) see nursing flow sheets Periwound:intact  Dressing procedure/placement/frequency: 1. Continue nursing skin care order set with foam for Stage 2 Pressure Injuries of the coccyx and sacrum 2. Continue POC for right heel wound per Dr. Cleda Mccreedy 3. Add Prevalon boots for use inpatient and outpatient to offload the heels.        10/28/19; Dr. Sammuel Bailiff clinic note.  Debrided devitalized tissue from the ulceration on the right heel sharply using tissue nippers with mostly just epidermal and dermal layers along the new area plantarly. Bacitracin and a gauze bandage applied. Discussed with the patient and family members that at this point I would like to set up a consult with vascular surgery as it has been several years since he was last seen. Has a history of intervention. Continue with the Santyl ointment and bandaging. Return to clinic in 3 weeks for reevaluation.   Follow up noted for 3 weeks for heel ulcer with Dr. Cleda Mccreedy  Discussed [place] with bedside nurse.  Re consult if needed, will not follow at this time. Thanks  Ja Pistole [employer], RN,CWOCN, CNS, [place] [phone])

## 2019-11-05 NOTE — Progress Notes (Signed)
No legal guardian. Won't clear from system.

## 2019-11-05 NOTE — TOC Progression Note (Signed)
Transition of Care [facility]) - Progression Note    Patient Details  Name: Matthew Bruce MRN: [id number] Date of Birth: 05/29/1926  [facility]) CM/SW Contact  Shelbie Hutching, RN Phone Number: 11/05/2019, 1:01 PM  Clinical Narrative:    Family would like to use [facility] services, they have used them in the past.  Jana Half with [facility] accepted referral for RN, PT, OT, and aide.     Expected Discharge Plan: [place] Barriers to Discharge: Continued Medical Work up  Expected Discharge Plan and Services Expected Discharge Plan: [place]   Discharge Planning Services: CM Consult Post Acute Care Choice: [place] arrangements for the past 2 months: [place]: [facility] [place] Date [place]: 11/05/19 Time [place]: 1301 Representative spoke with at [place]: [place] ([place]) Interventions    Readmission Risk Interventions No flowsheet data found.

## 2019-11-05 NOTE — Progress Notes (Signed)
Patient family they have POA but no one is Garment/textile technologist

## 2019-11-05 NOTE — Progress Notes (Signed)
Ambulance transport here to transport patient home. Son, Saban Heinlen called and notified. Per Nicole Kindred, family is awaiting pt's arrival.

## 2019-11-05 NOTE — Progress Notes (Signed)
Received MD order to discharge patient to home with Home health.  Reviewed discharge instructions, follow up appointments,  home meds and prescriptions with patient and family and they  verbalized understanding.

## 2019-11-05 NOTE — Discharge Summary (Addendum)
[facility] NAME: Matthew Bruce    MR#:  [id number]  DATE OF BIRTH:  05-30-1926  DATE OF ADMISSION:  11/03/2019 ADMITTING PHYSICIAN: Vianne Bulls, MD  DATE OF DISCHARGE: 11/05/2019  PRIMARY CARE PHYSICIAN: [name], MD    ADMISSION DIAGNOSIS:  Lower urinary tract infectious disease [N39.0] UTI (urinary tract infection) [N39.0] Weakness [R53.1] Pneumonia due to infectious organism, unspecified laterality, unspecified part of lung [J18.9]  DISCHARGE DIAGNOSIS:  Acute on CKD-IV Pseudomonas UTI  SECONDARY DIAGNOSIS:   Past Medical History:  Diagnosis Date  . Anemia   . Aortic valve disorder   . Coronary artery disease   . Diabetes mellitus without complication ([place])   . Glaucoma   . History of BPH   . History of kidney stones   . HOH (hard of hearing)    Bilateral Hearing Aids  . Hyperlipidemia   . Hypertension   . Kidney stones   . MRSA (methicillin resistant staph aureus) culture positive 04/01/2016   LEFT FOOT  . Myocardial infarction ([place])   . Neuropathy   . Peripheral vascular disease [facility])     HOSPITAL COURSE:   MEGHAN TIEMANN a 86 y.o.malewith medical history significant forhistory of CVA, type 2 diabetes mellitus, BPH, seizure disorder, right heel ulcer, and chronic kidney disease stage IV, now presenting to the emergency department for evaluation of lethargy to her bed and malodorous urine. He is accompanied by his daughter who provides most of the history. Patient has reportedly been generally weak and lethargic, not speaking as much as usual, and daughter noted that his urine appeared very cloudy and almost like pus.  patient has history of incontinence and BPH.   1.Acute UTI -Brought in by daughter he reports that he has been lethargic and urine was turbid and malodorous -He is not septic, does not have any SIRS criteria -Urine was cultured in ED and Rocephin  started -Continue Rocephin--change to po keflex -UC pseudomonas -patient remains afebrile.  2. Acute on chronic CKD IV secondary to poor PO intake with weakness in the setting of UTI -SCr is 2.34 on admission -baseline creatinine 1.9-2.1 -Renally-dose medications, monitor -received IV fluids -creatinine trending down to 2.32--2.2  3.Type II DM -use low-intensity SSI for now  4.Lung mass -RUL mass noted on CXR, daughter at bedside reports learning of this a couple years ago and did not think the patient would want any further workup for it  5.chronic right foot ulcer -Followed by podiatry and status-post recent debridement -Continue wound care  6.Hypertension -BP at goal, continue Norvasc  7.?Pneumonia -There was question of possible pneumonia on CXR but patient not coughing and not tachypneic or hypoxic--clinically do not feel this is pneumonia -He was given Rocephin and azithromycin in ED, COVID pcr negative -patient remains asymptomatic. No further workup needed at present  [facility] for discharge planning.  DVT prophylaxis:sq heparin Code Status:Full, confirmed with daughter at bedside Family Communication:Daughter updated at bedside Disposition Plan: Patient is from:Home Anticipated d/c is FT:DDUK with HH--lives with dter Anticipated d/c date GU:RKYHC  Consults called:None Admission status:Inpatient  d/c home d/w dters in the room CONSULTS OBTAINED:    DRUG ALLERGIES:   Allergies  Allergen Reactions  . Sulfa Antibiotics Other (See Comments)    Reaction: Severe Shaking, Weakness  . Macrobid [Nitrofurantoin Monohyd Macro] Diarrhea    DISCHARGE MEDICATIONS:   Allergies as of 11/05/2019      Reactions   Sulfa Antibiotics Other (  See Comments)   Reaction: Severe Shaking, Weakness   Macrobid [nitrofurantoin Monohyd Macro] Diarrhea      Medication List    TAKE these medications   amLODipine 5 MG  tablet Commonly known as: NORVASC Take 5 mg by mouth 2 (two) times daily.   cephALEXin 250 MG capsule Commonly known as: KEFLEX Take 1 capsule (250 mg total) by mouth every 8 (eight) hours for 5 days.   clopidogrel 75 MG tablet Commonly known as: PLAVIX TAKE 1 TABLET BY MOUTH EVERY DAY   collagenase ointment Commonly known as: SANTYL Apply topically daily. Start taking on: November 06, 2019   finasteride 5 MG tablet Commonly known as: PROSCAR Take 5 mg by mouth at bedtime.   glipiZIDE 5 MG tablet Commonly known as: GLUCOTROL Take 5 mg by mouth daily.   ibuprofen 800 MG tablet Commonly known as: ADVIL Take 800 mg by mouth daily.   latanoprost 0.005 % ophthalmic solution Commonly known as: XALATAN Place 1 drop into both eyes at bedtime.   levETIRAcetam 500 MG tablet Commonly known as: KEPPRA Take 1 tablet (500 mg total) by mouth 2 (two) times daily.   lovastatin 40 MG tablet Commonly known as: MEVACOR Take 40 mg by mouth daily.   magnesium oxide 400 MG tablet Commonly known as: MAG-OX Take 400 mg by mouth daily.   metFORMIN 1000 MG tablet Commonly known as: GLUCOPHAGE Take 1,000 mg by mouth 2 (two) times a day.   tamsulosin 0.4 MG Caps capsule Commonly known as: FLOMAX Take 1 capsule (0.4 mg total) by mouth daily.            Discharge Care Instructions  (From admission, onward)         Start     Ordered   11/05/19 0000  Discharge wound care:       Comments: As above   11/05/19 1349          If you experience worsening of your admission symptoms, develop shortness of breath, life threatening emergency, suicidal or homicidal thoughts you must seek medical attention immediately by calling 911 or calling your MD immediately  if symptoms less severe.  You Must read complete instructions/literature along with all the possible adverse reactions/side effects for all the Medicines you take and that have been prescribed to you. Take any new Medicines after  you have completely understood and accept all the possible adverse reactions/side effects.   Please note  You were cared for by a hospitalist during your hospital stay. If you have any questions about your discharge medications or the care you received while you were in the hospital after you are discharged, you can call the unit and asked to speak with the hospitalist on call if the hospitalist that took care of you is not available. Once you are discharged, your primary care physician will handle any further medical issues. Please note that NO REFILLS for any discharge medications will be authorized once you are discharged, as it is imperative that you return to your primary care physician (or establish a relationship with a primary care physician if you do not have one) for your aftercare needs so that they can reassess your need for medications and monitor your lab values. Today   SUBJECTIVE   Doing well  VITAL SIGNS:  Blood pressure (!) 144/73, pulse 88, temperature 97.9 F (36.6 C), temperature source Oral, resp. rate 16, height 6' (1.829 m), weight 86.2 kg, SpO2 97 %.  I/O:    Intake/Output  Summary (Last 24 hours) at 11/05/2019 1350 Last data filed at 11/05/2019 0900 Gross per 24 hour  Intake 137.62 ml  Output --  Net 137.62 ml    PHYSICAL EXAMINATION:  GENERAL:  86 y.o.-year-old patient lying in the bed with no acute distress.  HEENT: Head atraumatic, normocephalic. Oropharynx and nasopharynx clear. Hard on hearing NECK:  Supple, no jugular venous distention. No thyroid enlargement, no tenderness.  LUNGS: Normal breath sounds bilaterally, no wheezing, rales,rhonchi or crepitation. No use of accessory muscles of respiration.  CARDIOVASCULAR: S1, S2 normal. No murmurs, rubs, or gallops.  ABDOMEN: Soft, non-tender, non-distended. Bowel sounds present. No organomegaly or mass.  EXTREMITIES: No pedal edema, cyanosis, or clubbing.  NEUROLOGIC: Cranial nerves II through XII are intact.  Muscle strength 4+/5 in all extremities. Sensation intact. Gait not checked.  PSYCHIATRIC: The patient is alert and awake SKIN:as below POA Pressure Injury 09/07/16 Stage II -  Partial thickness loss of dermis presenting as a shallow open ulcer with a red, pink wound bed without slough. Coccyx stage 2 pressure injury (Active)  09/07/16 0538  Location: Coccyx  Location Orientation: Posterior  Staging: Stage II -  Partial thickness loss of dermis presenting as a shallow open ulcer with a red, pink wound bed without slough.  Wound Description (Comments): Coccyx stage 2 pressure injury  Present on Admission: Yes     Pressure Injury 07/03/18 Stage I -  Intact skin with non-blanchable redness of a localized area usually over a bony prominence. (Active)  07/03/18 2130  Location: Buttocks  Location Orientation: Right;Left  Staging: Stage I -  Intact skin with non-blanchable redness of a localized area usually over a bony prominence.  Wound Description (Comments):   Present on Admission: Yes     Pressure Injury 11/04/19 Coccyx Mid Stage 2 -  Partial thickness loss of dermis presenting as a shallow open injury with a red, pink wound bed without slough. (Active)  11/04/19 1846  Location: Coccyx  Location Orientation: Mid  Staging: Stage 2 -  Partial thickness loss of dermis presenting as a shallow open injury with a red, pink wound bed without slough.  Wound Description (Comments):   Present on Admission: Yes     Pressure Injury 11/04/19 Sacrum Mid Stage 2 -  Partial thickness loss of dermis presenting as a shallow open injury with a red, pink wound bed without slough. (Active)  11/04/19 1846  Location: Sacrum  Location Orientation: Mid  Staging: Stage 2 -  Partial thickness loss of dermis presenting as a shallow open injury with a red, pink wound bed without slough.  Wound Description (Comments):   Present on Admission: Yes     Pressure Injury 11/04/19 Heel Right Unstageable - Full thickness  tissue loss in which the base of the injury is covered by slough (yellow, tan, gray, green or brown) and/or eschar (tan, brown or black) in the wound bed. (Active)  11/04/19 1847  Location: Heel  Location Orientation: Right  Staging: Unstageable - Full thickness tissue loss in which the base of the injury is covered by slough (yellow, tan, gray, green or brown) and/or eschar (tan, brown or black) in the wound bed.  Wound Description (Comments):   Present on Admission: Yes       DATA REVIEW:   CBC  Recent Labs  Lab 11/04/19 0440  WBC 4.4  HGB 9.4*  HCT 28.9*  PLT 227    Chemistries  Recent Labs  Lab 11/03/19 1451 11/04/19 0440 11/05/19 0513  NA  134*   < > 135  K 4.9   < > 3.8  CL 103   < > 106  CO2 21*   < > 19*  GLUCOSE 227*   < > 146*  BUN 37*   < > 40*  CREATININE 2.34*   < > 2.21*  CALCIUM 9.1   < > 8.7*  AST 14*  --   --   ALT 10  --   --   ALKPHOS 77  --   --   BILITOT 0.7  --   --    < > = values in this interval not displayed.    Microbiology Results   Recent Results (from the past 240 hour(s))  Urine Culture     Status: Abnormal   Collection Time: 11/03/19  7:29 PM   Specimen: Urine, Random  Result Value Ref Range Status   Specimen Description   Final    URINE, RANDOM Performed at [facility], [address] [place], [place] [address]    Special Requests   Final    NONE Performed at [facility], [place], [place] [address]    Culture >=100,000 COLONIES/mL KLEBSIELLA PNEUMONIAE (A)  Final   Report Status 11/05/2019 FINAL  Final   Organism ID, Bacteria KLEBSIELLA PNEUMONIAE (A)  Final      Susceptibility   Klebsiella pneumoniae - MIC*    AMPICILLIN >=32 RESISTANT Resistant     CEFAZOLIN <=4 SENSITIVE Sensitive     CEFTRIAXONE <=0.25 SENSITIVE Sensitive     CIPROFLOXACIN <=0.25 SENSITIVE Sensitive     GENTAMICIN <=1 SENSITIVE Sensitive     IMIPENEM <=0.25 SENSITIVE Sensitive     NITROFURANTOIN 64  INTERMEDIATE Intermediate     TRIMETH/SULFA <=20 SENSITIVE Sensitive     AMPICILLIN/SULBACTAM 4 SENSITIVE Sensitive     PIP/TAZO <=4 SENSITIVE Sensitive     * >=100,000 COLONIES/mL KLEBSIELLA PNEUMONIAE  SARS Coronavirus 2 by RT PCR (hospital order, performed in [place] hospital lab) Nasopharyngeal Nasopharyngeal Swab     Status: None   Collection Time: 11/03/19 10:44 PM   Specimen: Nasopharyngeal Swab  Result Value Ref Range Status   SARS Coronavirus 2 NEGATIVE NEGATIVE Final    Comment: (NOTE) SARS-CoV-2 target nucleic acids are NOT DETECTED.  The SARS-CoV-2 RNA is generally detectable in upper and lower respiratory specimens during the acute phase of infection. The lowest concentration of SARS-CoV-2 viral copies this assay can detect is 250 copies / mL. A negative result does not preclude SARS-CoV-2 infection and should not be used as the sole basis for treatment or other patient management decisions.  A negative result may occur with improper specimen collection / handling, submission of specimen other than nasopharyngeal swab, presence of viral mutation(s) within the areas targeted by this assay, and inadequate number of viral copies (<250 copies / mL). A negative result must be combined with clinical observations, patient history, and epidemiological information.  Fact Sheet for Patients:   StrictlyIdeas.no  Fact Sheet for Healthcare Providers: BankingDealers.co.za  This test is not yet approved or  cleared by the [place] FDA and has been authorized for detection and/or diagnosis of SARS-CoV-2 by FDA under an Emergency Use Authorization (EUA).  This EUA will remain in effect (meaning this test can be used) for the duration of the COVID-19 declaration under Section 564(b)(1) of the Act, 21 U.S.C. section 360bbb-3(b)(1), unless the authorization is terminated or revoked sooner.  Performed at [facility], [address] [place], [place] [address]     RADIOLOGY:  DG Chest Portable 1 View  Result Date: 11/03/2019 CLINICAL DATA:  Cough, low-grade fever, cloudy urine EXAM: PORTABLE CHEST 1 VIEW COMPARISON:  Radiograph 07/03/2018, CT 05/26/2016 FINDINGS: Redemonstration of a masslike opacity in the right upper lobe, with some increasing architectural distortion of the right upper lobe as well. Suspect some degree of volume loss with retraction of the fissure towards the right lung apex. There is a background of additional heterogeneous peripheral predominant opacities in both lungs with some mild central vascular congestion. Cardiac silhouette is within normal limits for size demonstrating features of prior sternotomy and CABG with a calcified, tortuous aorta. The osseous structures appear diffusely demineralized which may limit detection of small or nondisplaced fractures. No acute osseous abnormality or suspicious osseous lesion. Degenerative changes are present in the imaged spine and shoulders. Mild edematous changes in the soft tissues. IMPRESSION: 1. Persistent masslike opacity in the right upper lobe with increasing architectural distortion as well as possible volume loss with retraction of the fissure towards the right lung apex. Appearance remains concerning for possible malignancy. 2. Additional heterogeneous peripheral predominant opacities in both lungs with mild central vascular congestion. Findings could reflect superimposed edema or atypical infection in the setting of low-grade fever. 3.  Aortic Atherosclerosis (ICD10-I70.0). Electronically Signed   By: Lovena Le M.D.   On: 11/03/2019 21:50     CODE STATUS:     Code Status Orders  (From admission, onward)         Start     Ordered   11/04/19 0310  Full code  Continuous        11/04/19 0309        Code Status History    Date Active Date Inactive Code Status Order ID Comments User Context   07/03/2018 2128 07/05/2018 1553 Full Code  [id number]  Sela Hua, MD Inpatient   09/07/2016 0454 09/08/2016 1942 Full Code [id number]  Harrie Foreman, MD Inpatient   07/18/2016 1257 07/18/2016 1800 Full Code [id number]  Algernon Huxley, MD Inpatient   07/06/2016 1326 07/06/2016 1849 Full Code [id number]  Algernon Huxley, MD Inpatient   05/26/2016 0249 05/30/2016 1954 Full Code [id number]  Lance Coon, MD Inpatient   02/03/2016 1550 02/03/2016 2042 Full Code [id number]  Algernon Huxley, MD Inpatient   11/05/2014 0059 11/06/2014 1656 Full Code [id number]  Hower, Aaron Mose, MD ED   Advance Care Planning Activity    Advance Directive Documentation     Most Recent Value  Type of Advance Directive Healthcare Power of Attorney, Living will  Pre-existing out of facility DNR order (yellow form or pink MOST form) --  "MOST" Form in Place? --       TOTAL TIME TAKING CARE OF THIS PATIENT: *35* minutes.    Fritzi Mandes M.D  [facility]    CC: Primary care physician; [name], MD

## 2019-11-06 ENCOUNTER — Other Ambulatory Visit: Payer: Self-pay

## 2019-11-06 ENCOUNTER — Other Ambulatory Visit: Payer: Self-pay | Admitting: *Deleted

## 2019-11-06 NOTE — Patient Outreach (Signed)
[facility]) Care Management  11/06/2019  Matthew Bruce [birth year] [id number]   Referral Date: 11/06/19 Referral Source: Hospital liaison Date of Admission: 11/03/19 Diagnosis: UTI Date of Discharge: 11/05/19 Facility: [place]: [facility]  Outreach attempt: spoke with daughter Matthew Bruce.  She reports patient is ok. She reports that patient is perking up some since being home. She reports she has not heard from PCP office but they are good about calling for follow up appointment.  She is also waiting to hear from [facility].     Social: Patient lives in the home with caregiver daughter Matthew Bruce.  Patient is total care with all aspects of care.  She states her daughter helps with patient care as well and other family pitch in and help too.    Conditions: Patient with history significant forhistory of CVA, type 2 diabetes mellitus, BPH, seizure disorder, right heel ulcer, and chronic kidney disease stage IV, recent hospitalization of UTI.    Medications: She reports patient has all medications and she administers those to him.   Appointments: Patient has follow up appointments with vascular doctor and podiatrist for heel ulcer coming up in the next two weeks.    Consent: RN CM reviewed [facility] services and support with daughter.  Daughter declines services at this time but agreeable to receive letter and brochure.     Plan: RN CM will send letter and close case.     Matthew Baseman, RN, MSN [facility] Care Management Care Management Coordinator Direct Line [phone] Toll Free: [phone]  Fax: [phone]

## 2019-11-08 ENCOUNTER — Other Ambulatory Visit: Payer: Self-pay

## 2019-11-11 ENCOUNTER — Other Ambulatory Visit (INDEPENDENT_AMBULATORY_CARE_PROVIDER_SITE_OTHER): Payer: Self-pay | Admitting: Vascular Surgery

## 2019-11-11 DIAGNOSIS — J189 Pneumonia, unspecified organism: Secondary | ICD-10-CM | POA: Diagnosis not present

## 2019-11-11 DIAGNOSIS — N184 Chronic kidney disease, stage 4 (severe): Secondary | ICD-10-CM | POA: Diagnosis not present

## 2019-11-11 DIAGNOSIS — I2510 Atherosclerotic heart disease of native coronary artery without angina pectoris: Secondary | ICD-10-CM | POA: Diagnosis not present

## 2019-11-11 DIAGNOSIS — L89152 Pressure ulcer of sacral region, stage 2: Secondary | ICD-10-CM | POA: Diagnosis not present

## 2019-11-11 DIAGNOSIS — E1151 Type 2 diabetes mellitus with diabetic peripheral angiopathy without gangrene: Secondary | ICD-10-CM | POA: Diagnosis not present

## 2019-11-11 DIAGNOSIS — I129 Hypertensive chronic kidney disease with stage 1 through stage 4 chronic kidney disease, or unspecified chronic kidney disease: Secondary | ICD-10-CM | POA: Diagnosis not present

## 2019-11-11 DIAGNOSIS — L97919 Non-pressure chronic ulcer of unspecified part of right lower leg with unspecified severity: Secondary | ICD-10-CM

## 2019-11-11 DIAGNOSIS — E1122 Type 2 diabetes mellitus with diabetic chronic kidney disease: Secondary | ICD-10-CM | POA: Diagnosis not present

## 2019-11-11 DIAGNOSIS — N390 Urinary tract infection, site not specified: Secondary | ICD-10-CM | POA: Diagnosis not present

## 2019-11-11 DIAGNOSIS — L89610 Pressure ulcer of right heel, unstageable: Secondary | ICD-10-CM | POA: Diagnosis not present

## 2019-11-12 ENCOUNTER — Encounter (INDEPENDENT_AMBULATORY_CARE_PROVIDER_SITE_OTHER): Payer: Medicare HMO | Admitting: Vascular Surgery

## 2019-11-12 ENCOUNTER — Ambulatory Visit (INDEPENDENT_AMBULATORY_CARE_PROVIDER_SITE_OTHER): Payer: Medicare HMO | Admitting: Vascular Surgery

## 2019-11-12 ENCOUNTER — Ambulatory Visit (INDEPENDENT_AMBULATORY_CARE_PROVIDER_SITE_OTHER): Payer: Medicare HMO

## 2019-11-12 ENCOUNTER — Other Ambulatory Visit: Payer: Self-pay

## 2019-11-12 ENCOUNTER — Encounter (INDEPENDENT_AMBULATORY_CARE_PROVIDER_SITE_OTHER): Payer: Self-pay | Admitting: Vascular Surgery

## 2019-11-12 ENCOUNTER — Encounter (INDEPENDENT_AMBULATORY_CARE_PROVIDER_SITE_OTHER): Payer: Medicare HMO

## 2019-11-12 VITALS — BP 118/73 | HR 93 | Resp 16 | Ht 72.0 in

## 2019-11-12 DIAGNOSIS — L97919 Non-pressure chronic ulcer of unspecified part of right lower leg with unspecified severity: Secondary | ICD-10-CM | POA: Diagnosis not present

## 2019-11-12 DIAGNOSIS — E1165 Type 2 diabetes mellitus with hyperglycemia: Secondary | ICD-10-CM | POA: Diagnosis not present

## 2019-11-12 DIAGNOSIS — I70234 Atherosclerosis of native arteries of right leg with ulceration of heel and midfoot: Secondary | ICD-10-CM

## 2019-11-12 DIAGNOSIS — N184 Chronic kidney disease, stage 4 (severe): Secondary | ICD-10-CM | POA: Diagnosis not present

## 2019-11-12 DIAGNOSIS — I639 Cerebral infarction, unspecified: Secondary | ICD-10-CM | POA: Diagnosis not present

## 2019-11-12 DIAGNOSIS — I10 Essential (primary) hypertension: Secondary | ICD-10-CM | POA: Diagnosis not present

## 2019-11-12 NOTE — Progress Notes (Signed)
Patient ID: MRK BUZBY, male   DOB: 1926/05/10, 86 y.o.   MRN: [id number]  Chief Complaint  Patient presents with  . New Patient (Initial Visit)    ref Cleda Mccreedy ulcer abi    HPI Matthew Bruce is a 86 y.o. male.  I am asked to see the patient by Dr. Cleda Mccreedy for evaluation of PAD secondary to a nonhealing ulceration.  He was last seen in our practice over 3 years ago.  He has undergone previous revascularization procedures for limb salvage in years past.  8 of his problems have been on the right foot and that is again the case.  He has a large right heel ulcer.  He has fairly advanced medical comorbidities and is very immobile.  He has a sacral decubitus in addition to his small left foot ulceration and sizable right heel ulceration.  These are predominantly pressure sores.  The patient can provide no history and this is obtained from his daughter.  His podiatrist has done a good job of local wound care, but the ulcer persists and progresses.  We performed noninvasive studies today which show noncompressible vessels bilaterally with monophasic waveforms on the right and multiphasic waveforms on the left.     Past Medical History:  Diagnosis Date  . Anemia   . Aortic valve disorder   . Coronary artery disease   . Diabetes mellitus without complication ([place])   . Glaucoma   . History of BPH   . History of kidney stones   . HOH (hard of hearing)    Bilateral Hearing Aids  . Hyperlipidemia   . Hypertension   . Kidney stones   . MRSA (methicillin resistant staph aureus) culture positive 04/01/2016   LEFT FOOT  . Myocardial infarction ([place])   . Neuropathy   . Peripheral vascular disease [facility])     Past Surgical History:  Procedure Laterality Date  . AMPUTATION TOE Left 02/11/2016   Procedure: AMPUTATION TOE;  Surgeon: Albertine Patricia, DPM;  Location: [facility];  Service: Podiatry;  Laterality: Left;  . AMPUTATION TOE Left 08/19/2016   Procedure: AMPUTATION TOE;  Surgeon:  Albertine Patricia, DPM;  Location: [facility];  Service: Podiatry;  Laterality: Left;  . APLIGRAFT PLACEMENT Right 08/19/2016   Procedure: APLIGRAFT PLACEMENT;  Surgeon: Albertine Patricia, DPM;  Location: [facility];  Service: Podiatry;  Laterality: Right;  . APPLICATION OF WOUND VAC Left 04/01/2016   Procedure: APPLICATION OF WOUND VAC;  Surgeon: Albertine Patricia, DPM;  Location: [facility];  Service: Podiatry;  Laterality: Left;  . CARDIAC CATHETERIZATION    . CARDIAC SURGERY    . CARDIAC VALVE REPLACEMENT    . CORONARY ARTERY BYPASS GRAFT    . CYSTOSCOPY W/ RETROGRADES Right 07/04/2018   Procedure: CYSTOSCOPY WITH RETROGRADE PYELOGRAM;  Surgeon: Billey Co, MD;  Location: [facility];  Service: Urology;  Laterality: Right;  . CYSTOSCOPY WITH STENT PLACEMENT Right 07/04/2018   Procedure: CYSTOSCOPY WITH STENT PLACEMENT;  Surgeon: Billey Co, MD;  Location: [facility];  Service: Urology;  Laterality: Right;  . CYSTOSCOPY/URETEROSCOPY/HOLMIUM LASER/STENT PLACEMENT Right 09/10/2018   Procedure: CYSTOSCOPY/URETEROSCOPY/HOLMIUM LASER/STENT PLACEMENT;  Surgeon: Billey Co, MD;  Location: [facility];  Service: Urology;  Laterality: Right;  . INCISION AND DRAINAGE Right 08/19/2016   Procedure: IRRIGATION AND DEBRIDEMENT RIGHT GREAT TOE;  Surgeon: Albertine Patricia, DPM;  Location: [facility];  Service: Podiatry;  Laterality: Right;  . IRRIGATION AND DEBRIDEMENT FOOT Left 04/01/2016   Procedure: IRRIGATION AND DEBRIDEMENT FOOT;  Surgeon: Albertine Patricia, DPM;  Location: [facility];  Service: Podiatry;  Laterality: Left;  . IRRIGATION AND DEBRIDEMENT FOOT Left 05/13/2016   Procedure: IRRIGATION AND DEBRIDEMENT FOOT;  Surgeon: Albertine Patricia, DPM;  Location: [facility];  Service: Podiatry;  Laterality: Left;  . LOWER EXTREMITY ANGIOGRAPHY Right 07/06/2016   Procedure: Lower Extremity Angiography;  Surgeon: Algernon Huxley, MD;  Location: [place] [facility];  Service: Cardiovascular;  Laterality: Right;  . LOWER  EXTREMITY ANGIOGRAPHY Right 07/18/2016   Procedure: Lower Extremity Angiography;  Surgeon: Algernon Huxley, MD;  Location: [place] [facility];  Service: Cardiovascular;  Laterality: Right;  . OSTECTOMY Left 05/13/2016   Procedure: OSTECTOMY;  Surgeon: Albertine Patricia, DPM;  Location: [facility];  Service: Podiatry;  Laterality: Left;  . PERIPHERAL VASCULAR CATHETERIZATION Left 02/03/2016   Procedure: Lower Extremity Angiography;  Surgeon: Algernon Huxley, MD;  Location: [place] [facility];  Service: Cardiovascular;  Laterality: Left;  . PERIPHERAL VASCULAR CATHETERIZATION Left 04/01/2016   Procedure: Lower Extremity Angiography;  Surgeon: Katha Cabal, MD;  Location: [place] [facility];  Service: Cardiovascular;  Laterality: Left;  Marland Kitchen VALVE REPLACEMENT  2007   Aortic Valve Replacement, [employer] Jude Porcine Valve     Family History  Problem Relation Age of Onset  . Heart attack Mother   . Alcohol abuse Father   . Throat cancer Sister   . Heart attack Brother   . Heart failure Neg Hx      Social History   Tobacco Use  . Smoking status: Former Smoker    Packs/day: 1.00    Types: Cigarettes  . Smokeless tobacco: Current User    Types: Chew  Vaping Use  . Vaping Use: Never used  Substance Use Topics  . Alcohol use: No  . Drug use: No     Allergies  Allergen Reactions  . Sulfa Antibiotics Other (See Comments)    Reaction: Severe Shaking, Weakness  . Macrobid [Nitrofurantoin Monohyd Macro] Diarrhea    Current Outpatient Medications  Medication Sig Dispense Refill  . amLODipine (NORVASC) 5 MG tablet Take 5 mg by mouth 2 (two) times daily.  3  . clopidogrel (PLAVIX) 75 MG tablet TAKE 1 TABLET BY MOUTH EVERY DAY (Patient taking differently: Take 75 mg by mouth daily. ) 90 tablet 1  . collagenase (SANTYL) ointment Apply topically daily. 15 g 0  . finasteride (PROSCAR) 5 MG tablet Take 5 mg by mouth at bedtime.     Marland Kitchen glipiZIDE (GLUCOTROL) 5 MG tablet Take 5 mg by mouth daily.  5  .  ibuprofen (ADVIL,MOTRIN) 800 MG tablet Take 800 mg by mouth daily.     Marland Kitchen latanoprost (XALATAN) 0.005 % ophthalmic solution Place 1 drop into both eyes at bedtime.    . lovastatin (MEVACOR) 40 MG tablet Take 40 mg by mouth daily.     . magnesium oxide (MAG-OX) 400 MG tablet Take 400 mg by mouth daily.    . metFORMIN (GLUCOPHAGE) 1000 MG tablet Take 1,000 mg by mouth 2 (two) times a day.     . tamsulosin (FLOMAX) 0.4 MG CAPS capsule Take 1 capsule (0.4 mg total) by mouth daily. 30 capsule   . levETIRAcetam (KEPPRA) 500 MG tablet Take 1 tablet (500 mg total) by mouth 2 (two) times daily. (Patient not taking: Reported on 11/12/2019) 60 tablet 1   No current facility-administered medications for this visit.      REVIEW OF SYSTEMS (Negative unless checked)  Constitutional: [] Weight loss  [] Fever  []   Chills Cardiac: [] Chest pain   [] Chest pressure   [] Palpitations   [] Shortness of breath when laying flat   [] Shortness of breath at rest   [] Shortness of breath with exertion. Vascular:  [] Pain in legs with walking   [] Pain in legs at rest   [] Pain in legs when laying flat   [] Claudication   [] Pain in feet when walking  [] Pain in feet at rest  [] Pain in feet when laying flat   [] History of DVT   [] Phlebitis   [] Swelling in legs   [] Varicose veins   [x] Non-healing ulcers Pulmonary:   [] Uses home oxygen   [] Productive cough   [] Hemoptysis   [] Wheeze  [] COPD   [] Asthma Neurologic:  [] Dizziness  [] Blackouts   [] Seizures   [] History of stroke   [] History of TIA  [] Aphasia   [] Temporary blindness   [] Dysphagia   [] Weakness or numbness in arms   [x] Weakness or numbness in legs Musculoskeletal:  [x] Arthritis   [] Joint swelling   [x] Joint pain   [] Low back pain Hematologic:  [] Easy bruising  [] Easy bleeding   [] Hypercoagulable state   [x] Anemic  [] Hepatitis Gastrointestinal:  [] Blood in stool   [] Vomiting blood  [] Gastroesophageal reflux/heartburn   [] Abdominal pain Genitourinary:  [] Chronic kidney disease    [] Difficult urination  [] Frequent urination  [] Burning with urination   [] Hematuria Skin:  [] Rashes   [x] Ulcers   [x] Wounds Psychological:  [] History of anxiety   []  History of major depression.    Physical Exam BP 118/73 (BP Location: Left Arm)   Pulse 93   Resp 16   Ht 6' (1.829 m)   BMI 25.77 kg/m  Gen:  WD/WN, NAD.  Debilitated appearing Head: [place]/AT, + temporalis wasting  Ear/Nose/Throat: Hearing diminished, nares w/o erythema or drainage, oropharynx w/o Erythema/Exudate Eyes: Conjunctiva clear, sclera non-icteric  Neck: trachea midline.  No JVD.  Pulmonary:  Good air movement, respirations not labored, no use of accessory muscles  Cardiac: Irregular Vascular:  Vessel Right Left  Radial Palpable Palpable                          DP  trace  1+  PT  not palpable  trace   Gastrointestinal:. No masses, surgical incisions, or scars. Musculoskeletal: M/S 5/5 throughout.  In a wheelchair.  Right heel ulceration is currently dressed. Neurologic: Sensation grossly intact in extremities.  Symmetrical.  Speech is very limited and difficult to discern. Motor exam as listed above. Psychiatric: Judgment and insight are poor.  Daughter provides the history. Dermatologic: Right heel ulceration is currently dressed.    Radiology DG Chest Portable 1 View  Result Date: 11/03/2019 CLINICAL DATA:  Cough, low-grade fever, cloudy urine EXAM: PORTABLE CHEST 1 VIEW COMPARISON:  Radiograph 07/03/2018, CT 05/26/2016 FINDINGS: Redemonstration of a masslike opacity in the right upper lobe, with some increasing architectural distortion of the right upper lobe as well. Suspect some degree of volume loss with retraction of the fissure towards the right lung apex. There is a background of additional heterogeneous peripheral predominant opacities in both lungs with some mild central vascular congestion. Cardiac silhouette is within normal limits for size demonstrating features of prior sternotomy and  CABG with a calcified, tortuous aorta. The osseous structures appear diffusely demineralized which may limit detection of small or nondisplaced fractures. No acute osseous abnormality or suspicious osseous lesion. Degenerative changes are present in the imaged spine and shoulders. Mild edematous changes in the soft tissues. IMPRESSION: 1. Persistent masslike opacity  in the right upper lobe with increasing architectural distortion as well as possible volume loss with retraction of the fissure towards the right lung apex. Appearance remains concerning for possible malignancy. 2. Additional heterogeneous peripheral predominant opacities in both lungs with mild central vascular congestion. Findings could reflect superimposed edema or atypical infection in the setting of low-grade fever. 3.  Aortic Atherosclerosis (ICD10-I70.0). Electronically Signed   By: Lovena Le M.D.   On: 11/03/2019 21:50    Labs Recent Results (from the past 2160 hour(s))  Basic metabolic panel     Status: Abnormal   Collection Time: 09/19/19 12:02 PM  Result Value Ref Range   Sodium 133 (L) 135 - 145 mmol/L   Potassium 4.9 3.5 - 5.1 mmol/L   Chloride 105 98 - 111 mmol/L   CO2 17 (L) 22 - 32 mmol/L   Glucose, Bld 249 (H) 70 - 99 mg/dL    Comment: Glucose reference range applies only to samples taken after fasting for at least 8 hours.   BUN 40 (H) 8 - 23 mg/dL   Creatinine, Ser 2.38 (H) 0.61 - 1.24 mg/dL   Calcium 9.7 8.9 - 10.3 mg/dL   GFR calc non Af Amer 23 (L) >60 mL/min   GFR calc Af Amer 27 (L) >60 mL/min   Anion gap 11 5 - 15    Comment: Performed at [facility], [address] [place], [place] [address]  CBC     Status: Abnormal   Collection Time: 09/19/19 12:02 PM  Result Value Ref Range   WBC 5.2 4.0 - 10.5 K/uL   RBC 4.17 (L) 4.22 - 5.81 MIL/uL   Hemoglobin 11.5 (L) 13.0 - 17.0 g/dL   HCT 33.1 (L) 39 - 52 %   MCV 79.4 (L) 80.0 - 100.0 fL   MCH 27.6 26.0 - 34.0 pg   MCHC 34.7 30.0 - 36.0 g/dL     RDW 15.4 11.5 - 15.5 %   Platelets 264 150 - 400 K/uL   nRBC 0.0 0.0 - 0.2 %    Comment: Performed at [facility], [place], [place] [address]  Troponin I (High Sensitivity)     Status: Abnormal   Collection Time: 09/19/19 12:02 PM  Result Value Ref Range   Troponin I (High Sensitivity) 25 (H) <18 ng/L    Comment: (NOTE) Elevated high sensitivity troponin I (hsTnI) values and significant  changes across serial measurements may suggest ACS but many other  chronic and acute conditions are known to elevate hsTnI results.  Refer to the "Links" section for chest pain algorithms and additional  guidance. Performed at [facility], [place], [place] [address]   Troponin I (High Sensitivity)     Status: Abnormal   Collection Time: 09/19/19  2:34 PM  Result Value Ref Range   Troponin I (High Sensitivity) 20 (H) <18 ng/L    Comment: (NOTE) Elevated high sensitivity troponin I (hsTnI) values and significant  changes across serial measurements may suggest ACS but many other  chronic and acute conditions are known to elevate hsTnI results.  Refer to the "Links" section for chest pain algorithms and additional  guidance. Performed at [facility], [address] [place], [place] [address]   Urinalysis, Complete w Microscopic     Status: Abnormal   Collection Time: 09/19/19  7:44 PM  Result Value Ref Range   Color, Urine YELLOW (A) YELLOW   APPearance TURBID (A) CLEAR  Specific Gravity, Urine 1.009 1.005 - 1.030   pH 6.0 5.0 - 8.0   Glucose, UA NEGATIVE NEGATIVE mg/dL   Hgb urine dipstick SMALL (A) NEGATIVE   Bilirubin Urine NEGATIVE NEGATIVE   Ketones, ur NEGATIVE NEGATIVE mg/dL   Protein, ur 100 (A) NEGATIVE mg/dL   Nitrite NEGATIVE NEGATIVE   Leukocytes,Ua LARGE (A) NEGATIVE   RBC / HPF 11-20 0 - 5 RBC/hpf   WBC, UA >50 (H) 0 - 5 WBC/hpf   Bacteria, UA MANY (A) NONE SEEN   Squamous Epithelial / LPF NONE SEEN 0 - 5    WBC Clumps PRESENT    Hyaline Casts, UA PRESENT     Comment: Performed at [facility], [address] [place], [place] [address]  Urine culture     Status: Abnormal   Collection Time: 09/19/19  7:44 PM   Specimen: Urine, Clean Catch  Result Value Ref Range   Specimen Description      URINE, CLEAN CATCH Performed at [facility], [address] [place], [place] [address]    Special Requests      NONE Performed at [facility], [address] [place], [place] [address]    Culture >=100,000 COLONIES/mL KLEBSIELLA PNEUMONIAE (A)    Report Status 09/22/2019 FINAL    Organism ID, Bacteria KLEBSIELLA PNEUMONIAE (A)       Susceptibility   Klebsiella pneumoniae - MIC*    AMPICILLIN >=32 RESISTANT Resistant     CEFAZOLIN <=4 SENSITIVE Sensitive     CEFTRIAXONE <=0.25 SENSITIVE Sensitive     CIPROFLOXACIN <=0.25 SENSITIVE Sensitive     GENTAMICIN <=1 SENSITIVE Sensitive     IMIPENEM <=0.25 SENSITIVE Sensitive     NITROFURANTOIN 64 INTERMEDIATE Intermediate     TRIMETH/SULFA <=20 SENSITIVE Sensitive     AMPICILLIN/SULBACTAM 4 SENSITIVE Sensitive     PIP/TAZO 8 SENSITIVE Sensitive     * >=100,000 COLONIES/mL KLEBSIELLA PNEUMONIAE  CBC     Status: Abnormal   Collection Time: 11/03/19  2:51 PM  Result Value Ref Range   WBC 6.4 4.0 - 10.5 K/uL   RBC 3.62 (L) 4.22 - 5.81 MIL/uL   Hemoglobin 10.5 (L) 13.0 - 17.0 g/dL   HCT 30.8 (L) 39 - 52 %   MCV 85.1 80.0 - 100.0 fL   MCH 29.0 26.0 - 34.0 pg   MCHC 34.1 30.0 - 36.0 g/dL   RDW 16.0 (H) 11.5 - 15.5 %   Platelets 281 150 - 400 K/uL   nRBC 0.0 0.0 - 0.2 %    Comment: Performed at [facility], [address] [place], [place] [address]  Comprehensive metabolic panel     Status: Abnormal   Collection Time: 11/03/19  2:51 PM  Result Value Ref Range   Sodium 134 (L) 135 - 145 mmol/L   Potassium 4.9 3.5 - 5.1 mmol/L   Chloride 103 98 - 111 mmol/L   CO2 21 (L) 22 - 32 mmol/L   Glucose, Bld 227  (H) 70 - 99 mg/dL    Comment: Glucose reference range applies only to samples taken after fasting for at least 8 hours.   BUN 37 (H) 8 - 23 mg/dL   Creatinine, Ser 2.34 (H) 0.61 - 1.24 mg/dL   Calcium 9.1 8.9 - 10.3 mg/dL   Total Protein 6.6 6.5 - 8.1 g/dL   Albumin 3.0 (L) 3.5 - 5.0 g/dL   AST 14 (L) 15 - 41 U/L   ALT 10 0 - 44  U/L   Alkaline Phosphatase 77 38 - 126 U/L   Total Bilirubin 0.7 0.3 - 1.2 mg/dL   GFR calc non Af Amer 24 (L) >60 mL/min   GFR calc Af Amer 27 (L) >60 mL/min   Anion gap 10 5 - 15    Comment: Performed at [facility], [address] [place], [place] [address]  Glucose, capillary     Status: Abnormal   Collection Time: 11/03/19  2:55 PM  Result Value Ref Range   Glucose-Capillary 209 (H) 70 - 99 mg/dL    Comment: Glucose reference range applies only to samples taken after fasting for at least 8 hours.  Urinalysis, Complete w Microscopic     Status: Abnormal   Collection Time: 11/03/19  7:29 PM  Result Value Ref Range   Color, Urine YELLOW (A) YELLOW   APPearance TURBID (A) CLEAR   Specific Gravity, Urine 1.010 1.005 - 1.030   pH 6.0 5.0 - 8.0   Glucose, UA 50 (A) NEGATIVE mg/dL   Hgb urine dipstick MODERATE (A) NEGATIVE   Bilirubin Urine NEGATIVE NEGATIVE   Ketones, ur NEGATIVE NEGATIVE mg/dL   Protein, ur 100 (A) NEGATIVE mg/dL   Nitrite NEGATIVE NEGATIVE   Leukocytes,Ua MODERATE (A) NEGATIVE   RBC / HPF 21-50 0 - 5 RBC/hpf   WBC, UA >50 (H) 0 - 5 WBC/hpf   Bacteria, UA NONE SEEN NONE SEEN   Squamous Epithelial / LPF NONE SEEN 0 - 5   WBC Clumps PRESENT     Comment: Performed at [facility], [address] [place], [place] [address]  Urine Culture     Status: Abnormal   Collection Time: 11/03/19  7:29 PM   Specimen: Urine, Random  Result Value Ref Range   Specimen Description      URINE, RANDOM Performed at [facility], [address], [place], [place] [address]    Special Requests      NONE Performed at  [facility], [place], [place] [address]    Culture >=100,000 COLONIES/mL KLEBSIELLA PNEUMONIAE (A)    Report Status 11/05/2019 FINAL    Organism ID, Bacteria KLEBSIELLA PNEUMONIAE (A)       Susceptibility   Klebsiella pneumoniae - MIC*    AMPICILLIN >=32 RESISTANT Resistant     CEFAZOLIN <=4 SENSITIVE Sensitive     CEFTRIAXONE <=0.25 SENSITIVE Sensitive     CIPROFLOXACIN <=0.25 SENSITIVE Sensitive     GENTAMICIN <=1 SENSITIVE Sensitive     IMIPENEM <=0.25 SENSITIVE Sensitive     NITROFURANTOIN 64 INTERMEDIATE Intermediate     TRIMETH/SULFA <=20 SENSITIVE Sensitive     AMPICILLIN/SULBACTAM 4 SENSITIVE Sensitive     PIP/TAZO <=4 SENSITIVE Sensitive     * >=100,000 COLONIES/mL KLEBSIELLA PNEUMONIAE  SARS Coronavirus 2 by RT PCR (hospital order, performed in [place] hospital lab) Nasopharyngeal Nasopharyngeal Swab     Status: None   Collection Time: 11/03/19 10:44 PM   Specimen: Nasopharyngeal Swab  Result Value Ref Range   SARS Coronavirus 2 NEGATIVE NEGATIVE    Comment: (NOTE) SARS-CoV-2 target nucleic acids are NOT DETECTED.  The SARS-CoV-2 RNA is generally detectable in upper and lower respiratory specimens during the acute phase of infection. The lowest concentration of SARS-CoV-2 viral copies this assay can detect is 250 copies / mL. A negative result does not preclude SARS-CoV-2 infection and should not be used as the sole basis for treatment or other patient management decisions.  A negative result may  occur with improper specimen collection / handling, submission of specimen other than nasopharyngeal swab, presence of viral mutation(s) within the areas targeted by this assay, and inadequate number of viral copies (<250 copies / mL). A negative result must be combined with clinical observations, patient history, and epidemiological information.  Fact Sheet for Patients:   StrictlyIdeas.no  Fact Sheet for Healthcare  Providers: BankingDealers.co.za  This test is not yet approved or  cleared by the [place] FDA and has been authorized for detection and/or diagnosis of SARS-CoV-2 by FDA under an Emergency Use Authorization (EUA).  This EUA will remain in effect (meaning this test can be used) for the duration of the COVID-19 declaration under Section 564(b)(1) of the Act, 21 U.S.C. section 360bbb-3(b)(1), unless the authorization is terminated or revoked sooner.  Performed at [facility], [address] [place], [place] [address]   Basic metabolic panel     Status: Abnormal   Collection Time: 11/04/19  4:40 AM  Result Value Ref Range   Sodium 136 135 - 145 mmol/L   Potassium 4.6 3.5 - 5.1 mmol/L   Chloride 107 98 - 111 mmol/L   CO2 18 (L) 22 - 32 mmol/L   Glucose, Bld 196 (H) 70 - 99 mg/dL    Comment: Glucose reference range applies only to samples taken after fasting for at least 8 hours.   BUN 41 (H) 8 - 23 mg/dL   Creatinine, Ser 2.32 (H) 0.61 - 1.24 mg/dL   Calcium 8.9 8.9 - 10.3 mg/dL   GFR calc non Af Amer 24 (L) >60 mL/min   GFR calc Af Amer 28 (L) >60 mL/min   Anion gap 11 5 - 15    Comment: Performed at [facility], [address] [place], [place] [address]  CBC     Status: Abnormal   Collection Time: 11/04/19  4:40 AM  Result Value Ref Range   WBC 4.4 4.0 - 10.5 K/uL   RBC 3.33 (L) 4.22 - 5.81 MIL/uL   Hemoglobin 9.4 (L) 13.0 - 17.0 g/dL   HCT 28.9 (L) 39 - 52 %   MCV 86.8 80.0 - 100.0 fL   MCH 28.2 26.0 - 34.0 pg   MCHC 32.5 30.0 - 36.0 g/dL   RDW 15.7 (H) 11.5 - 15.5 %   Platelets 227 150 - 400 K/uL   nRBC 0.0 0.0 - 0.2 %    Comment: Performed at [facility], [address] [place], [place] [address]  Hemoglobin A1c     Status: Abnormal   Collection Time: 11/04/19  4:40 AM  Result Value Ref Range   Hgb A1c MFr Bld 6.9 (H) 4.8 - 5.6 %    Comment: (NOTE) Pre diabetes:          5.7%-6.4%  Diabetes:               >6.4%  Glycemic control for   <7.0% adults with diabetes    Mean Plasma Glucose 151.33 mg/dL    Comment: Performed at [place] [address], [place], [place] [address]  Glucose, capillary     Status: Abnormal   Collection Time: 11/04/19 12:37 PM  Result Value Ref Range   Glucose-Capillary 168 (H) 70 - 99 mg/dL    Comment: Glucose reference range applies only to samples taken after fasting for at least 8 hours.  Glucose, capillary     Status: Abnormal   Collection Time: 11/04/19  5:14 PM  Result Value  Ref Range   Glucose-Capillary 247 (H) 70 - 99 mg/dL    Comment: Glucose reference range applies only to samples taken after fasting for at least 8 hours.  Glucose, capillary     Status: Abnormal   Collection Time: 11/04/19  9:23 PM  Result Value Ref Range   Glucose-Capillary 206 (H) 70 - 99 mg/dL    Comment: Glucose reference range applies only to samples taken after fasting for at least 8 hours.  Basic metabolic panel     Status: Abnormal   Collection Time: 11/05/19  5:13 AM  Result Value Ref Range   Sodium 135 135 - 145 mmol/L   Potassium 3.8 3.5 - 5.1 mmol/L   Chloride 106 98 - 111 mmol/L   CO2 19 (L) 22 - 32 mmol/L   Glucose, Bld 146 (H) 70 - 99 mg/dL    Comment: Glucose reference range applies only to samples taken after fasting for at least 8 hours.   BUN 40 (H) 8 - 23 mg/dL   Creatinine, Ser 2.21 (H) 0.61 - 1.24 mg/dL   Calcium 8.7 (L) 8.9 - 10.3 mg/dL   GFR calc non Af Amer 25 (L) >60 mL/min   GFR calc Af Amer 29 (L) >60 mL/min   Anion gap 10 5 - 15    Comment: Performed at [facility], [address] [place], [place] [address]  Glucose, capillary     Status: Abnormal   Collection Time: 11/05/19  7:42 AM  Result Value Ref Range   Glucose-Capillary 133 (H) 70 - 99 mg/dL    Comment: Glucose reference range applies only to samples taken after fasting for at least 8 hours.  Glucose, capillary     Status: Abnormal   Collection Time: 11/05/19  7:42  AM  Result Value Ref Range   Glucose-Capillary 133 (H) 70 - 99 mg/dL    Comment: Glucose reference range applies only to samples taken after fasting for at least 8 hours.  Glucose, capillary     Status: Abnormal   Collection Time: 11/05/19 12:38 PM  Result Value Ref Range   Glucose-Capillary 158 (H) 70 - 99 mg/dL    Comment: Glucose reference range applies only to samples taken after fasting for at least 8 hours.  Glucose, capillary     Status: Abnormal   Collection Time: 11/05/19  4:22 PM  Result Value Ref Range   Glucose-Capillary 167 (H) 70 - 99 mg/dL    Comment: Glucose reference range applies only to samples taken after fasting for at least 8 hours.  Glucose, capillary     Status: Abnormal   Collection Time: 11/05/19 10:03 PM  Result Value Ref Range   Glucose-Capillary 154 (H) 70 - 99 mg/dL    Comment: Glucose reference range applies only to samples taken after fasting for at least 8 hours.   Comment 1 Notify RN     Assessment/Plan:  Essential hypertension blood pressure control important in reducing the progression of atherosclerotic disease. On appropriate oral medications.   CKD (chronic kidney disease), stage IV ([place]) Will need hydration and limitation of contrast with any angiographic procedures.  Type II diabetes mellitus, uncontrolled (HCC) blood glucose control important in reducing the progression of atherosclerotic disease. Also, involved in wound healing. On appropriate medications.   Atherosclerotic peripheral vascular disease with ulceration (HCC) We performed noninvasive studies today which show noncompressible vessels bilaterally with monophasic waveforms on the right and multiphasic waveforms on the left.   This is clearly a critical  and limb threatening situation in a very ill elderly patient with a litany of medical comorbidities.  Complexity is extreme.  He is a terrible surgical candidate and the daughter says that amputation is out of the question.  He  has a clear risk of limb loss.  Angiography is somewhat high risk given his severe chronic kidney disease and multiple other issues, but at this point that needs to be considered.  The daughter would like to give this some time to heal now that they have gotten pressure boots and are following with a podiatrist.  I discussed this is certainly reasonable, but without good circulation wound healing is really not expected to be brisk.  They are going to see the podiatrist next week and then let us know if they would like to proceed with angiogram in the near future.      Leotis Pain 11/12/2019, 5:09 PM   This note was created with Dragon medical transcription system.  Any errors from dictation are unintentional.

## 2019-11-12 NOTE — Assessment & Plan Note (Signed)
blood glucose control important in reducing the progression of atherosclerotic disease. Also, involved in wound healing. On appropriate medications.  

## 2019-11-12 NOTE — Assessment & Plan Note (Signed)
blood pressure control important in reducing the progression of atherosclerotic disease. On appropriate oral medications.  

## 2019-11-12 NOTE — Assessment & Plan Note (Signed)
Will need hydration and limitation of contrast with any angiographic procedures.

## 2019-11-12 NOTE — Patient Instructions (Signed)

## 2019-11-12 NOTE — Assessment & Plan Note (Signed)
We performed noninvasive studies today which show noncompressible vessels bilaterally with monophasic waveforms on the right and multiphasic waveforms on the left.   This is clearly a critical and limb threatening situation in a very ill elderly patient with a litany of medical comorbidities.  Complexity is extreme.  He is a terrible surgical candidate and the daughter says that amputation is out of the question.  He has a clear risk of limb loss.  Angiography is somewhat high risk given his severe chronic kidney disease and multiple other issues, but at this point that needs to be considered.  The daughter would like to give this some time to heal now that they have gotten pressure boots and are following with a podiatrist.  I discussed this is certainly reasonable, but without good circulation wound healing is really not expected to be brisk.  They are going to see the podiatrist next week and then let us know if they would like to proceed with angiogram in the near future.

## 2019-11-14 DIAGNOSIS — L89152 Pressure ulcer of sacral region, stage 2: Secondary | ICD-10-CM | POA: Diagnosis not present

## 2019-11-14 DIAGNOSIS — J189 Pneumonia, unspecified organism: Secondary | ICD-10-CM | POA: Diagnosis not present

## 2019-11-14 DIAGNOSIS — I2510 Atherosclerotic heart disease of native coronary artery without angina pectoris: Secondary | ICD-10-CM | POA: Diagnosis not present

## 2019-11-14 DIAGNOSIS — E1151 Type 2 diabetes mellitus with diabetic peripheral angiopathy without gangrene: Secondary | ICD-10-CM | POA: Diagnosis not present

## 2019-11-14 DIAGNOSIS — L89610 Pressure ulcer of right heel, unstageable: Secondary | ICD-10-CM | POA: Diagnosis not present

## 2019-11-14 DIAGNOSIS — N390 Urinary tract infection, site not specified: Secondary | ICD-10-CM | POA: Diagnosis not present

## 2019-11-14 DIAGNOSIS — E1122 Type 2 diabetes mellitus with diabetic chronic kidney disease: Secondary | ICD-10-CM | POA: Diagnosis not present

## 2019-11-14 DIAGNOSIS — N184 Chronic kidney disease, stage 4 (severe): Secondary | ICD-10-CM | POA: Diagnosis not present

## 2019-11-14 DIAGNOSIS — I129 Hypertensive chronic kidney disease with stage 1 through stage 4 chronic kidney disease, or unspecified chronic kidney disease: Secondary | ICD-10-CM | POA: Diagnosis not present

## 2019-11-18 DIAGNOSIS — L97412 Non-pressure chronic ulcer of right heel and midfoot with fat layer exposed: Secondary | ICD-10-CM | POA: Diagnosis not present

## 2019-11-18 DIAGNOSIS — E1140 Type 2 diabetes mellitus with diabetic neuropathy, unspecified: Secondary | ICD-10-CM | POA: Diagnosis not present

## 2019-11-19 DIAGNOSIS — L89152 Pressure ulcer of sacral region, stage 2: Secondary | ICD-10-CM | POA: Diagnosis not present

## 2019-11-19 DIAGNOSIS — N184 Chronic kidney disease, stage 4 (severe): Secondary | ICD-10-CM | POA: Diagnosis not present

## 2019-11-19 DIAGNOSIS — E1122 Type 2 diabetes mellitus with diabetic chronic kidney disease: Secondary | ICD-10-CM | POA: Diagnosis not present

## 2019-11-19 DIAGNOSIS — I2510 Atherosclerotic heart disease of native coronary artery without angina pectoris: Secondary | ICD-10-CM | POA: Diagnosis not present

## 2019-11-19 DIAGNOSIS — N390 Urinary tract infection, site not specified: Secondary | ICD-10-CM | POA: Diagnosis not present

## 2019-11-19 DIAGNOSIS — L89610 Pressure ulcer of right heel, unstageable: Secondary | ICD-10-CM | POA: Diagnosis not present

## 2019-11-19 DIAGNOSIS — E1151 Type 2 diabetes mellitus with diabetic peripheral angiopathy without gangrene: Secondary | ICD-10-CM | POA: Diagnosis not present

## 2019-11-19 DIAGNOSIS — J189 Pneumonia, unspecified organism: Secondary | ICD-10-CM | POA: Diagnosis not present

## 2019-11-19 DIAGNOSIS — I129 Hypertensive chronic kidney disease with stage 1 through stage 4 chronic kidney disease, or unspecified chronic kidney disease: Secondary | ICD-10-CM | POA: Diagnosis not present

## 2019-11-20 DIAGNOSIS — L89610 Pressure ulcer of right heel, unstageable: Secondary | ICD-10-CM | POA: Diagnosis not present

## 2019-11-20 DIAGNOSIS — I129 Hypertensive chronic kidney disease with stage 1 through stage 4 chronic kidney disease, or unspecified chronic kidney disease: Secondary | ICD-10-CM | POA: Diagnosis not present

## 2019-11-20 DIAGNOSIS — N184 Chronic kidney disease, stage 4 (severe): Secondary | ICD-10-CM | POA: Diagnosis not present

## 2019-11-20 DIAGNOSIS — J189 Pneumonia, unspecified organism: Secondary | ICD-10-CM | POA: Diagnosis not present

## 2019-11-20 DIAGNOSIS — E1151 Type 2 diabetes mellitus with diabetic peripheral angiopathy without gangrene: Secondary | ICD-10-CM | POA: Diagnosis not present

## 2019-11-20 DIAGNOSIS — E1122 Type 2 diabetes mellitus with diabetic chronic kidney disease: Secondary | ICD-10-CM | POA: Diagnosis not present

## 2019-11-20 DIAGNOSIS — I2510 Atherosclerotic heart disease of native coronary artery without angina pectoris: Secondary | ICD-10-CM | POA: Diagnosis not present

## 2019-11-20 DIAGNOSIS — L89152 Pressure ulcer of sacral region, stage 2: Secondary | ICD-10-CM | POA: Diagnosis not present

## 2019-11-20 DIAGNOSIS — N390 Urinary tract infection, site not specified: Secondary | ICD-10-CM | POA: Diagnosis not present

## 2019-11-22 DIAGNOSIS — E1151 Type 2 diabetes mellitus with diabetic peripheral angiopathy without gangrene: Secondary | ICD-10-CM | POA: Diagnosis not present

## 2019-11-22 DIAGNOSIS — I129 Hypertensive chronic kidney disease with stage 1 through stage 4 chronic kidney disease, or unspecified chronic kidney disease: Secondary | ICD-10-CM | POA: Diagnosis not present

## 2019-11-22 DIAGNOSIS — E1122 Type 2 diabetes mellitus with diabetic chronic kidney disease: Secondary | ICD-10-CM | POA: Diagnosis not present

## 2019-11-22 DIAGNOSIS — J189 Pneumonia, unspecified organism: Secondary | ICD-10-CM | POA: Diagnosis not present

## 2019-11-22 DIAGNOSIS — I2510 Atherosclerotic heart disease of native coronary artery without angina pectoris: Secondary | ICD-10-CM | POA: Diagnosis not present

## 2019-11-22 DIAGNOSIS — N184 Chronic kidney disease, stage 4 (severe): Secondary | ICD-10-CM | POA: Diagnosis not present

## 2019-11-22 DIAGNOSIS — L89152 Pressure ulcer of sacral region, stage 2: Secondary | ICD-10-CM | POA: Diagnosis not present

## 2019-11-22 DIAGNOSIS — L89610 Pressure ulcer of right heel, unstageable: Secondary | ICD-10-CM | POA: Diagnosis not present

## 2019-11-22 DIAGNOSIS — N390 Urinary tract infection, site not specified: Secondary | ICD-10-CM | POA: Diagnosis not present

## 2019-11-25 DIAGNOSIS — E1122 Type 2 diabetes mellitus with diabetic chronic kidney disease: Secondary | ICD-10-CM | POA: Diagnosis not present

## 2019-11-25 DIAGNOSIS — N184 Chronic kidney disease, stage 4 (severe): Secondary | ICD-10-CM | POA: Diagnosis not present

## 2019-11-25 DIAGNOSIS — N390 Urinary tract infection, site not specified: Secondary | ICD-10-CM | POA: Diagnosis not present

## 2019-11-25 DIAGNOSIS — J189 Pneumonia, unspecified organism: Secondary | ICD-10-CM | POA: Diagnosis not present

## 2019-11-25 DIAGNOSIS — L89152 Pressure ulcer of sacral region, stage 2: Secondary | ICD-10-CM | POA: Diagnosis not present

## 2019-11-25 DIAGNOSIS — I129 Hypertensive chronic kidney disease with stage 1 through stage 4 chronic kidney disease, or unspecified chronic kidney disease: Secondary | ICD-10-CM | POA: Diagnosis not present

## 2019-11-25 DIAGNOSIS — L89610 Pressure ulcer of right heel, unstageable: Secondary | ICD-10-CM | POA: Diagnosis not present

## 2019-11-25 DIAGNOSIS — E1151 Type 2 diabetes mellitus with diabetic peripheral angiopathy without gangrene: Secondary | ICD-10-CM | POA: Diagnosis not present

## 2019-11-25 DIAGNOSIS — I2510 Atherosclerotic heart disease of native coronary artery without angina pectoris: Secondary | ICD-10-CM | POA: Diagnosis not present

## 2019-11-26 DIAGNOSIS — J189 Pneumonia, unspecified organism: Secondary | ICD-10-CM | POA: Diagnosis not present

## 2019-11-26 DIAGNOSIS — I129 Hypertensive chronic kidney disease with stage 1 through stage 4 chronic kidney disease, or unspecified chronic kidney disease: Secondary | ICD-10-CM | POA: Diagnosis not present

## 2019-11-26 DIAGNOSIS — L89152 Pressure ulcer of sacral region, stage 2: Secondary | ICD-10-CM | POA: Diagnosis not present

## 2019-11-26 DIAGNOSIS — N184 Chronic kidney disease, stage 4 (severe): Secondary | ICD-10-CM | POA: Diagnosis not present

## 2019-11-26 DIAGNOSIS — E1122 Type 2 diabetes mellitus with diabetic chronic kidney disease: Secondary | ICD-10-CM | POA: Diagnosis not present

## 2019-11-26 DIAGNOSIS — L89610 Pressure ulcer of right heel, unstageable: Secondary | ICD-10-CM | POA: Diagnosis not present

## 2019-11-26 DIAGNOSIS — N390 Urinary tract infection, site not specified: Secondary | ICD-10-CM | POA: Diagnosis not present

## 2019-11-26 DIAGNOSIS — E1151 Type 2 diabetes mellitus with diabetic peripheral angiopathy without gangrene: Secondary | ICD-10-CM | POA: Diagnosis not present

## 2019-11-26 DIAGNOSIS — I2510 Atherosclerotic heart disease of native coronary artery without angina pectoris: Secondary | ICD-10-CM | POA: Diagnosis not present

## 2019-11-27 DIAGNOSIS — L89152 Pressure ulcer of sacral region, stage 2: Secondary | ICD-10-CM | POA: Diagnosis not present

## 2019-11-27 DIAGNOSIS — E1151 Type 2 diabetes mellitus with diabetic peripheral angiopathy without gangrene: Secondary | ICD-10-CM | POA: Diagnosis not present

## 2019-11-27 DIAGNOSIS — E1122 Type 2 diabetes mellitus with diabetic chronic kidney disease: Secondary | ICD-10-CM | POA: Diagnosis not present

## 2019-11-27 DIAGNOSIS — I129 Hypertensive chronic kidney disease with stage 1 through stage 4 chronic kidney disease, or unspecified chronic kidney disease: Secondary | ICD-10-CM | POA: Diagnosis not present

## 2019-11-27 DIAGNOSIS — N390 Urinary tract infection, site not specified: Secondary | ICD-10-CM | POA: Diagnosis not present

## 2019-11-27 DIAGNOSIS — I2510 Atherosclerotic heart disease of native coronary artery without angina pectoris: Secondary | ICD-10-CM | POA: Diagnosis not present

## 2019-11-27 DIAGNOSIS — N184 Chronic kidney disease, stage 4 (severe): Secondary | ICD-10-CM | POA: Diagnosis not present

## 2019-11-27 DIAGNOSIS — L89610 Pressure ulcer of right heel, unstageable: Secondary | ICD-10-CM | POA: Diagnosis not present

## 2019-11-27 DIAGNOSIS — J189 Pneumonia, unspecified organism: Secondary | ICD-10-CM | POA: Diagnosis not present

## 2019-11-28 DIAGNOSIS — N184 Chronic kidney disease, stage 4 (severe): Secondary | ICD-10-CM | POA: Diagnosis not present

## 2019-11-28 DIAGNOSIS — N390 Urinary tract infection, site not specified: Secondary | ICD-10-CM | POA: Diagnosis not present

## 2019-11-28 DIAGNOSIS — E1151 Type 2 diabetes mellitus with diabetic peripheral angiopathy without gangrene: Secondary | ICD-10-CM | POA: Diagnosis not present

## 2019-11-28 DIAGNOSIS — I2510 Atherosclerotic heart disease of native coronary artery without angina pectoris: Secondary | ICD-10-CM | POA: Diagnosis not present

## 2019-11-28 DIAGNOSIS — J189 Pneumonia, unspecified organism: Secondary | ICD-10-CM | POA: Diagnosis not present

## 2019-11-28 DIAGNOSIS — L89152 Pressure ulcer of sacral region, stage 2: Secondary | ICD-10-CM | POA: Diagnosis not present

## 2019-11-28 DIAGNOSIS — E1122 Type 2 diabetes mellitus with diabetic chronic kidney disease: Secondary | ICD-10-CM | POA: Diagnosis not present

## 2019-11-28 DIAGNOSIS — I129 Hypertensive chronic kidney disease with stage 1 through stage 4 chronic kidney disease, or unspecified chronic kidney disease: Secondary | ICD-10-CM | POA: Diagnosis not present

## 2019-11-28 DIAGNOSIS — L89610 Pressure ulcer of right heel, unstageable: Secondary | ICD-10-CM | POA: Diagnosis not present

## 2019-11-29 DIAGNOSIS — J189 Pneumonia, unspecified organism: Secondary | ICD-10-CM | POA: Diagnosis not present

## 2019-11-29 DIAGNOSIS — E1122 Type 2 diabetes mellitus with diabetic chronic kidney disease: Secondary | ICD-10-CM | POA: Diagnosis not present

## 2019-11-29 DIAGNOSIS — N184 Chronic kidney disease, stage 4 (severe): Secondary | ICD-10-CM | POA: Diagnosis not present

## 2019-11-29 DIAGNOSIS — L89152 Pressure ulcer of sacral region, stage 2: Secondary | ICD-10-CM | POA: Diagnosis not present

## 2019-11-29 DIAGNOSIS — N390 Urinary tract infection, site not specified: Secondary | ICD-10-CM | POA: Diagnosis not present

## 2019-11-29 DIAGNOSIS — I129 Hypertensive chronic kidney disease with stage 1 through stage 4 chronic kidney disease, or unspecified chronic kidney disease: Secondary | ICD-10-CM | POA: Diagnosis not present

## 2019-11-29 DIAGNOSIS — L89610 Pressure ulcer of right heel, unstageable: Secondary | ICD-10-CM | POA: Diagnosis not present

## 2019-12-02 DIAGNOSIS — L89610 Pressure ulcer of right heel, unstageable: Secondary | ICD-10-CM | POA: Diagnosis not present

## 2019-12-02 DIAGNOSIS — E1122 Type 2 diabetes mellitus with diabetic chronic kidney disease: Secondary | ICD-10-CM | POA: Diagnosis not present

## 2019-12-02 DIAGNOSIS — J189 Pneumonia, unspecified organism: Secondary | ICD-10-CM | POA: Diagnosis not present

## 2019-12-02 DIAGNOSIS — I129 Hypertensive chronic kidney disease with stage 1 through stage 4 chronic kidney disease, or unspecified chronic kidney disease: Secondary | ICD-10-CM | POA: Diagnosis not present

## 2019-12-02 DIAGNOSIS — I2510 Atherosclerotic heart disease of native coronary artery without angina pectoris: Secondary | ICD-10-CM | POA: Diagnosis not present

## 2019-12-02 DIAGNOSIS — E1151 Type 2 diabetes mellitus with diabetic peripheral angiopathy without gangrene: Secondary | ICD-10-CM | POA: Diagnosis not present

## 2019-12-02 DIAGNOSIS — N390 Urinary tract infection, site not specified: Secondary | ICD-10-CM | POA: Diagnosis not present

## 2019-12-02 DIAGNOSIS — N184 Chronic kidney disease, stage 4 (severe): Secondary | ICD-10-CM | POA: Diagnosis not present

## 2019-12-02 DIAGNOSIS — L89152 Pressure ulcer of sacral region, stage 2: Secondary | ICD-10-CM | POA: Diagnosis not present

## 2019-12-04 DIAGNOSIS — J189 Pneumonia, unspecified organism: Secondary | ICD-10-CM | POA: Diagnosis not present

## 2019-12-04 DIAGNOSIS — N390 Urinary tract infection, site not specified: Secondary | ICD-10-CM | POA: Diagnosis not present

## 2019-12-04 DIAGNOSIS — I2510 Atherosclerotic heart disease of native coronary artery without angina pectoris: Secondary | ICD-10-CM | POA: Diagnosis not present

## 2019-12-04 DIAGNOSIS — N184 Chronic kidney disease, stage 4 (severe): Secondary | ICD-10-CM | POA: Diagnosis not present

## 2019-12-04 DIAGNOSIS — E1122 Type 2 diabetes mellitus with diabetic chronic kidney disease: Secondary | ICD-10-CM | POA: Diagnosis not present

## 2019-12-04 DIAGNOSIS — L89152 Pressure ulcer of sacral region, stage 2: Secondary | ICD-10-CM | POA: Diagnosis not present

## 2019-12-04 DIAGNOSIS — I129 Hypertensive chronic kidney disease with stage 1 through stage 4 chronic kidney disease, or unspecified chronic kidney disease: Secondary | ICD-10-CM | POA: Diagnosis not present

## 2019-12-04 DIAGNOSIS — L89610 Pressure ulcer of right heel, unstageable: Secondary | ICD-10-CM | POA: Diagnosis not present

## 2019-12-04 DIAGNOSIS — E1151 Type 2 diabetes mellitus with diabetic peripheral angiopathy without gangrene: Secondary | ICD-10-CM | POA: Diagnosis not present

## 2019-12-05 DIAGNOSIS — E1151 Type 2 diabetes mellitus with diabetic peripheral angiopathy without gangrene: Secondary | ICD-10-CM | POA: Diagnosis not present

## 2019-12-05 DIAGNOSIS — I2510 Atherosclerotic heart disease of native coronary artery without angina pectoris: Secondary | ICD-10-CM | POA: Diagnosis not present

## 2019-12-05 DIAGNOSIS — L89610 Pressure ulcer of right heel, unstageable: Secondary | ICD-10-CM | POA: Diagnosis not present

## 2019-12-05 DIAGNOSIS — L89152 Pressure ulcer of sacral region, stage 2: Secondary | ICD-10-CM | POA: Diagnosis not present

## 2019-12-05 DIAGNOSIS — E1122 Type 2 diabetes mellitus with diabetic chronic kidney disease: Secondary | ICD-10-CM | POA: Diagnosis not present

## 2019-12-05 DIAGNOSIS — N390 Urinary tract infection, site not specified: Secondary | ICD-10-CM | POA: Diagnosis not present

## 2019-12-05 DIAGNOSIS — N184 Chronic kidney disease, stage 4 (severe): Secondary | ICD-10-CM | POA: Diagnosis not present

## 2019-12-05 DIAGNOSIS — J189 Pneumonia, unspecified organism: Secondary | ICD-10-CM | POA: Diagnosis not present

## 2019-12-05 DIAGNOSIS — I129 Hypertensive chronic kidney disease with stage 1 through stage 4 chronic kidney disease, or unspecified chronic kidney disease: Secondary | ICD-10-CM | POA: Diagnosis not present

## 2019-12-06 DIAGNOSIS — L89152 Pressure ulcer of sacral region, stage 2: Secondary | ICD-10-CM | POA: Diagnosis not present

## 2019-12-06 DIAGNOSIS — I2510 Atherosclerotic heart disease of native coronary artery without angina pectoris: Secondary | ICD-10-CM | POA: Diagnosis not present

## 2019-12-06 DIAGNOSIS — N184 Chronic kidney disease, stage 4 (severe): Secondary | ICD-10-CM | POA: Diagnosis not present

## 2019-12-06 DIAGNOSIS — N390 Urinary tract infection, site not specified: Secondary | ICD-10-CM | POA: Diagnosis not present

## 2019-12-06 DIAGNOSIS — L89610 Pressure ulcer of right heel, unstageable: Secondary | ICD-10-CM | POA: Diagnosis not present

## 2019-12-06 DIAGNOSIS — J189 Pneumonia, unspecified organism: Secondary | ICD-10-CM | POA: Diagnosis not present

## 2019-12-06 DIAGNOSIS — E1151 Type 2 diabetes mellitus with diabetic peripheral angiopathy without gangrene: Secondary | ICD-10-CM | POA: Diagnosis not present

## 2019-12-06 DIAGNOSIS — E1122 Type 2 diabetes mellitus with diabetic chronic kidney disease: Secondary | ICD-10-CM | POA: Diagnosis not present

## 2019-12-06 DIAGNOSIS — I129 Hypertensive chronic kidney disease with stage 1 through stage 4 chronic kidney disease, or unspecified chronic kidney disease: Secondary | ICD-10-CM | POA: Diagnosis not present

## 2019-12-09 DIAGNOSIS — N184 Chronic kidney disease, stage 4 (severe): Secondary | ICD-10-CM | POA: Diagnosis not present

## 2019-12-09 DIAGNOSIS — I2510 Atherosclerotic heart disease of native coronary artery without angina pectoris: Secondary | ICD-10-CM | POA: Diagnosis not present

## 2019-12-09 DIAGNOSIS — E1122 Type 2 diabetes mellitus with diabetic chronic kidney disease: Secondary | ICD-10-CM | POA: Diagnosis not present

## 2019-12-09 DIAGNOSIS — I129 Hypertensive chronic kidney disease with stage 1 through stage 4 chronic kidney disease, or unspecified chronic kidney disease: Secondary | ICD-10-CM | POA: Diagnosis not present

## 2019-12-09 DIAGNOSIS — J189 Pneumonia, unspecified organism: Secondary | ICD-10-CM | POA: Diagnosis not present

## 2019-12-09 DIAGNOSIS — E1151 Type 2 diabetes mellitus with diabetic peripheral angiopathy without gangrene: Secondary | ICD-10-CM | POA: Diagnosis not present

## 2019-12-09 DIAGNOSIS — L89152 Pressure ulcer of sacral region, stage 2: Secondary | ICD-10-CM | POA: Diagnosis not present

## 2019-12-09 DIAGNOSIS — L89610 Pressure ulcer of right heel, unstageable: Secondary | ICD-10-CM | POA: Diagnosis not present

## 2019-12-09 DIAGNOSIS — N390 Urinary tract infection, site not specified: Secondary | ICD-10-CM | POA: Diagnosis not present

## 2019-12-10 ENCOUNTER — Ambulatory Visit (INDEPENDENT_AMBULATORY_CARE_PROVIDER_SITE_OTHER): Payer: Medicare HMO | Admitting: Vascular Surgery

## 2019-12-11 DIAGNOSIS — I2510 Atherosclerotic heart disease of native coronary artery without angina pectoris: Secondary | ICD-10-CM | POA: Diagnosis not present

## 2019-12-11 DIAGNOSIS — E1122 Type 2 diabetes mellitus with diabetic chronic kidney disease: Secondary | ICD-10-CM | POA: Diagnosis not present

## 2019-12-11 DIAGNOSIS — N390 Urinary tract infection, site not specified: Secondary | ICD-10-CM | POA: Diagnosis not present

## 2019-12-11 DIAGNOSIS — L89610 Pressure ulcer of right heel, unstageable: Secondary | ICD-10-CM | POA: Diagnosis not present

## 2019-12-11 DIAGNOSIS — L89152 Pressure ulcer of sacral region, stage 2: Secondary | ICD-10-CM | POA: Diagnosis not present

## 2019-12-11 DIAGNOSIS — E1151 Type 2 diabetes mellitus with diabetic peripheral angiopathy without gangrene: Secondary | ICD-10-CM | POA: Diagnosis not present

## 2019-12-11 DIAGNOSIS — J189 Pneumonia, unspecified organism: Secondary | ICD-10-CM | POA: Diagnosis not present

## 2019-12-11 DIAGNOSIS — N184 Chronic kidney disease, stage 4 (severe): Secondary | ICD-10-CM | POA: Diagnosis not present

## 2019-12-11 DIAGNOSIS — I129 Hypertensive chronic kidney disease with stage 1 through stage 4 chronic kidney disease, or unspecified chronic kidney disease: Secondary | ICD-10-CM | POA: Diagnosis not present

## 2019-12-12 DIAGNOSIS — E1151 Type 2 diabetes mellitus with diabetic peripheral angiopathy without gangrene: Secondary | ICD-10-CM | POA: Diagnosis not present

## 2019-12-12 DIAGNOSIS — E1122 Type 2 diabetes mellitus with diabetic chronic kidney disease: Secondary | ICD-10-CM | POA: Diagnosis not present

## 2019-12-12 DIAGNOSIS — J189 Pneumonia, unspecified organism: Secondary | ICD-10-CM | POA: Diagnosis not present

## 2019-12-12 DIAGNOSIS — N184 Chronic kidney disease, stage 4 (severe): Secondary | ICD-10-CM | POA: Diagnosis not present

## 2019-12-12 DIAGNOSIS — I129 Hypertensive chronic kidney disease with stage 1 through stage 4 chronic kidney disease, or unspecified chronic kidney disease: Secondary | ICD-10-CM | POA: Diagnosis not present

## 2019-12-12 DIAGNOSIS — I2510 Atherosclerotic heart disease of native coronary artery without angina pectoris: Secondary | ICD-10-CM | POA: Diagnosis not present

## 2019-12-12 DIAGNOSIS — L89610 Pressure ulcer of right heel, unstageable: Secondary | ICD-10-CM | POA: Diagnosis not present

## 2019-12-12 DIAGNOSIS — L89152 Pressure ulcer of sacral region, stage 2: Secondary | ICD-10-CM | POA: Diagnosis not present

## 2019-12-12 DIAGNOSIS — N390 Urinary tract infection, site not specified: Secondary | ICD-10-CM | POA: Diagnosis not present

## 2019-12-13 DIAGNOSIS — I2510 Atherosclerotic heart disease of native coronary artery without angina pectoris: Secondary | ICD-10-CM | POA: Diagnosis not present

## 2019-12-13 DIAGNOSIS — N390 Urinary tract infection, site not specified: Secondary | ICD-10-CM | POA: Diagnosis not present

## 2019-12-13 DIAGNOSIS — E1122 Type 2 diabetes mellitus with diabetic chronic kidney disease: Secondary | ICD-10-CM | POA: Diagnosis not present

## 2019-12-13 DIAGNOSIS — L89610 Pressure ulcer of right heel, unstageable: Secondary | ICD-10-CM | POA: Diagnosis not present

## 2019-12-13 DIAGNOSIS — I639 Cerebral infarction, unspecified: Secondary | ICD-10-CM | POA: Diagnosis not present

## 2019-12-13 DIAGNOSIS — E1151 Type 2 diabetes mellitus with diabetic peripheral angiopathy without gangrene: Secondary | ICD-10-CM | POA: Diagnosis not present

## 2019-12-13 DIAGNOSIS — N184 Chronic kidney disease, stage 4 (severe): Secondary | ICD-10-CM | POA: Diagnosis not present

## 2019-12-13 DIAGNOSIS — I129 Hypertensive chronic kidney disease with stage 1 through stage 4 chronic kidney disease, or unspecified chronic kidney disease: Secondary | ICD-10-CM | POA: Diagnosis not present

## 2019-12-13 DIAGNOSIS — L89152 Pressure ulcer of sacral region, stage 2: Secondary | ICD-10-CM | POA: Diagnosis not present

## 2019-12-13 DIAGNOSIS — J189 Pneumonia, unspecified organism: Secondary | ICD-10-CM | POA: Diagnosis not present

## 2019-12-16 DIAGNOSIS — E1140 Type 2 diabetes mellitus with diabetic neuropathy, unspecified: Secondary | ICD-10-CM | POA: Diagnosis not present

## 2019-12-16 DIAGNOSIS — E1151 Type 2 diabetes mellitus with diabetic peripheral angiopathy without gangrene: Secondary | ICD-10-CM | POA: Diagnosis not present

## 2019-12-16 DIAGNOSIS — J189 Pneumonia, unspecified organism: Secondary | ICD-10-CM | POA: Diagnosis not present

## 2019-12-16 DIAGNOSIS — L89610 Pressure ulcer of right heel, unstageable: Secondary | ICD-10-CM | POA: Diagnosis not present

## 2019-12-16 DIAGNOSIS — E1122 Type 2 diabetes mellitus with diabetic chronic kidney disease: Secondary | ICD-10-CM | POA: Diagnosis not present

## 2019-12-16 DIAGNOSIS — I739 Peripheral vascular disease, unspecified: Secondary | ICD-10-CM | POA: Diagnosis not present

## 2019-12-16 DIAGNOSIS — L89152 Pressure ulcer of sacral region, stage 2: Secondary | ICD-10-CM | POA: Diagnosis not present

## 2019-12-16 DIAGNOSIS — N184 Chronic kidney disease, stage 4 (severe): Secondary | ICD-10-CM | POA: Diagnosis not present

## 2019-12-16 DIAGNOSIS — L97412 Non-pressure chronic ulcer of right heel and midfoot with fat layer exposed: Secondary | ICD-10-CM | POA: Diagnosis not present

## 2019-12-16 DIAGNOSIS — I2510 Atherosclerotic heart disease of native coronary artery without angina pectoris: Secondary | ICD-10-CM | POA: Diagnosis not present

## 2019-12-16 DIAGNOSIS — N390 Urinary tract infection, site not specified: Secondary | ICD-10-CM | POA: Diagnosis not present

## 2019-12-16 DIAGNOSIS — I129 Hypertensive chronic kidney disease with stage 1 through stage 4 chronic kidney disease, or unspecified chronic kidney disease: Secondary | ICD-10-CM | POA: Diagnosis not present

## 2019-12-17 DIAGNOSIS — N184 Chronic kidney disease, stage 4 (severe): Secondary | ICD-10-CM | POA: Diagnosis not present

## 2019-12-17 DIAGNOSIS — E1122 Type 2 diabetes mellitus with diabetic chronic kidney disease: Secondary | ICD-10-CM | POA: Diagnosis not present

## 2019-12-17 DIAGNOSIS — L89610 Pressure ulcer of right heel, unstageable: Secondary | ICD-10-CM | POA: Diagnosis not present

## 2019-12-17 DIAGNOSIS — J189 Pneumonia, unspecified organism: Secondary | ICD-10-CM | POA: Diagnosis not present

## 2019-12-17 DIAGNOSIS — I2510 Atherosclerotic heart disease of native coronary artery without angina pectoris: Secondary | ICD-10-CM | POA: Diagnosis not present

## 2019-12-17 DIAGNOSIS — N390 Urinary tract infection, site not specified: Secondary | ICD-10-CM | POA: Diagnosis not present

## 2019-12-17 DIAGNOSIS — L89152 Pressure ulcer of sacral region, stage 2: Secondary | ICD-10-CM | POA: Diagnosis not present

## 2019-12-17 DIAGNOSIS — I129 Hypertensive chronic kidney disease with stage 1 through stage 4 chronic kidney disease, or unspecified chronic kidney disease: Secondary | ICD-10-CM | POA: Diagnosis not present

## 2019-12-17 DIAGNOSIS — E1151 Type 2 diabetes mellitus with diabetic peripheral angiopathy without gangrene: Secondary | ICD-10-CM | POA: Diagnosis not present

## 2019-12-18 DIAGNOSIS — I129 Hypertensive chronic kidney disease with stage 1 through stage 4 chronic kidney disease, or unspecified chronic kidney disease: Secondary | ICD-10-CM | POA: Diagnosis not present

## 2019-12-18 DIAGNOSIS — L89610 Pressure ulcer of right heel, unstageable: Secondary | ICD-10-CM | POA: Diagnosis not present

## 2019-12-18 DIAGNOSIS — N390 Urinary tract infection, site not specified: Secondary | ICD-10-CM | POA: Diagnosis not present

## 2019-12-18 DIAGNOSIS — N184 Chronic kidney disease, stage 4 (severe): Secondary | ICD-10-CM | POA: Diagnosis not present

## 2019-12-18 DIAGNOSIS — E1151 Type 2 diabetes mellitus with diabetic peripheral angiopathy without gangrene: Secondary | ICD-10-CM | POA: Diagnosis not present

## 2019-12-18 DIAGNOSIS — I2510 Atherosclerotic heart disease of native coronary artery without angina pectoris: Secondary | ICD-10-CM | POA: Diagnosis not present

## 2019-12-18 DIAGNOSIS — L89152 Pressure ulcer of sacral region, stage 2: Secondary | ICD-10-CM | POA: Diagnosis not present

## 2019-12-18 DIAGNOSIS — J189 Pneumonia, unspecified organism: Secondary | ICD-10-CM | POA: Diagnosis not present

## 2019-12-18 DIAGNOSIS — E1122 Type 2 diabetes mellitus with diabetic chronic kidney disease: Secondary | ICD-10-CM | POA: Diagnosis not present

## 2019-12-19 DIAGNOSIS — N184 Chronic kidney disease, stage 4 (severe): Secondary | ICD-10-CM | POA: Diagnosis not present

## 2019-12-19 DIAGNOSIS — I129 Hypertensive chronic kidney disease with stage 1 through stage 4 chronic kidney disease, or unspecified chronic kidney disease: Secondary | ICD-10-CM | POA: Diagnosis not present

## 2019-12-19 DIAGNOSIS — E1122 Type 2 diabetes mellitus with diabetic chronic kidney disease: Secondary | ICD-10-CM | POA: Diagnosis not present

## 2019-12-19 DIAGNOSIS — I2510 Atherosclerotic heart disease of native coronary artery without angina pectoris: Secondary | ICD-10-CM | POA: Diagnosis not present

## 2019-12-19 DIAGNOSIS — L89610 Pressure ulcer of right heel, unstageable: Secondary | ICD-10-CM | POA: Diagnosis not present

## 2019-12-19 DIAGNOSIS — N390 Urinary tract infection, site not specified: Secondary | ICD-10-CM | POA: Diagnosis not present

## 2019-12-19 DIAGNOSIS — J189 Pneumonia, unspecified organism: Secondary | ICD-10-CM | POA: Diagnosis not present

## 2019-12-19 DIAGNOSIS — E1151 Type 2 diabetes mellitus with diabetic peripheral angiopathy without gangrene: Secondary | ICD-10-CM | POA: Diagnosis not present

## 2019-12-19 DIAGNOSIS — L89152 Pressure ulcer of sacral region, stage 2: Secondary | ICD-10-CM | POA: Diagnosis not present

## 2019-12-20 ENCOUNTER — Emergency Department (HOSPITAL_COMMUNITY): Payer: Medicare HMO

## 2019-12-20 ENCOUNTER — Other Ambulatory Visit: Payer: Self-pay

## 2019-12-20 ENCOUNTER — Encounter (HOSPITAL_COMMUNITY): Payer: Self-pay | Admitting: Neurology

## 2019-12-20 ENCOUNTER — Inpatient Hospital Stay (HOSPITAL_COMMUNITY)
Admission: EM | Admit: 2019-12-20 | Discharge: 2020-01-03 | DRG: 061 | Disposition: E | Payer: Medicare HMO | Attending: Neurology | Admitting: Neurology

## 2019-12-20 ENCOUNTER — Inpatient Hospital Stay (HOSPITAL_COMMUNITY): Payer: Medicare HMO

## 2019-12-20 DIAGNOSIS — Z79899 Other long term (current) drug therapy: Secondary | ICD-10-CM | POA: Diagnosis not present

## 2019-12-20 DIAGNOSIS — C3411 Malignant neoplasm of upper lobe, right bronchus or lung: Secondary | ICD-10-CM | POA: Diagnosis present

## 2019-12-20 DIAGNOSIS — R531 Weakness: Secondary | ICD-10-CM | POA: Diagnosis not present

## 2019-12-20 DIAGNOSIS — Z7189 Other specified counseling: Secondary | ICD-10-CM

## 2019-12-20 DIAGNOSIS — I252 Old myocardial infarction: Secondary | ICD-10-CM

## 2019-12-20 DIAGNOSIS — R1312 Dysphagia, oropharyngeal phase: Secondary | ICD-10-CM | POA: Diagnosis present

## 2019-12-20 DIAGNOSIS — B961 Klebsiella pneumoniae [K. pneumoniae] as the cause of diseases classified elsewhere: Secondary | ICD-10-CM | POA: Diagnosis present

## 2019-12-20 DIAGNOSIS — N1832 Chronic kidney disease, stage 3b: Secondary | ICD-10-CM | POA: Diagnosis not present

## 2019-12-20 DIAGNOSIS — R29818 Other symptoms and signs involving the nervous system: Secondary | ICD-10-CM | POA: Diagnosis not present

## 2019-12-20 DIAGNOSIS — N390 Urinary tract infection, site not specified: Secondary | ICD-10-CM | POA: Diagnosis not present

## 2019-12-20 DIAGNOSIS — Z66 Do not resuscitate: Secondary | ICD-10-CM | POA: Diagnosis not present

## 2019-12-20 DIAGNOSIS — I129 Hypertensive chronic kidney disease with stage 1 through stage 4 chronic kidney disease, or unspecified chronic kidney disease: Secondary | ICD-10-CM | POA: Diagnosis present

## 2019-12-20 DIAGNOSIS — Z8673 Personal history of transient ischemic attack (TIA), and cerebral infarction without residual deficits: Secondary | ICD-10-CM | POA: Diagnosis not present

## 2019-12-20 DIAGNOSIS — Z515 Encounter for palliative care: Secondary | ICD-10-CM | POA: Diagnosis not present

## 2019-12-20 DIAGNOSIS — J189 Pneumonia, unspecified organism: Secondary | ICD-10-CM | POA: Diagnosis not present

## 2019-12-20 DIAGNOSIS — D631 Anemia in chronic kidney disease: Secondary | ICD-10-CM | POA: Diagnosis present

## 2019-12-20 DIAGNOSIS — Z7401 Bed confinement status: Secondary | ICD-10-CM

## 2019-12-20 DIAGNOSIS — Z89422 Acquired absence of other left toe(s): Secondary | ICD-10-CM

## 2019-12-20 DIAGNOSIS — E785 Hyperlipidemia, unspecified: Secondary | ICD-10-CM | POA: Diagnosis present

## 2019-12-20 DIAGNOSIS — E1151 Type 2 diabetes mellitus with diabetic peripheral angiopathy without gangrene: Secondary | ICD-10-CM | POA: Diagnosis not present

## 2019-12-20 DIAGNOSIS — R471 Dysarthria and anarthria: Secondary | ICD-10-CM | POA: Diagnosis present

## 2019-12-20 DIAGNOSIS — N184 Chronic kidney disease, stage 4 (severe): Secondary | ICD-10-CM | POA: Diagnosis not present

## 2019-12-20 DIAGNOSIS — R918 Other nonspecific abnormal finding of lung field: Secondary | ICD-10-CM | POA: Diagnosis not present

## 2019-12-20 DIAGNOSIS — R4701 Aphasia: Secondary | ICD-10-CM | POA: Diagnosis present

## 2019-12-20 DIAGNOSIS — I63412 Cerebral infarction due to embolism of left middle cerebral artery: Secondary | ICD-10-CM | POA: Diagnosis not present

## 2019-12-20 DIAGNOSIS — E1122 Type 2 diabetes mellitus with diabetic chronic kidney disease: Secondary | ICD-10-CM | POA: Diagnosis not present

## 2019-12-20 DIAGNOSIS — R29722 NIHSS score 22: Secondary | ICD-10-CM | POA: Diagnosis present

## 2019-12-20 DIAGNOSIS — L89610 Pressure ulcer of right heel, unstageable: Secondary | ICD-10-CM | POA: Diagnosis present

## 2019-12-20 DIAGNOSIS — G8191 Hemiplegia, unspecified affecting right dominant side: Secondary | ICD-10-CM | POA: Diagnosis present

## 2019-12-20 DIAGNOSIS — E7800 Pure hypercholesterolemia, unspecified: Secondary | ICD-10-CM | POA: Diagnosis not present

## 2019-12-20 DIAGNOSIS — Z7984 Long term (current) use of oral hypoglycemic drugs: Secondary | ICD-10-CM

## 2019-12-20 DIAGNOSIS — T17908A Unspecified foreign body in respiratory tract, part unspecified causing other injury, initial encounter: Secondary | ICD-10-CM | POA: Diagnosis not present

## 2019-12-20 DIAGNOSIS — E43 Unspecified severe protein-calorie malnutrition: Secondary | ICD-10-CM | POA: Diagnosis not present

## 2019-12-20 DIAGNOSIS — R569 Unspecified convulsions: Secondary | ICD-10-CM

## 2019-12-20 DIAGNOSIS — N179 Acute kidney failure, unspecified: Secondary | ICD-10-CM | POA: Diagnosis not present

## 2019-12-20 DIAGNOSIS — I6300 Cerebral infarction due to thrombosis of unspecified precerebral artery: Secondary | ICD-10-CM

## 2019-12-20 DIAGNOSIS — Z7902 Long term (current) use of antithrombotics/antiplatelets: Secondary | ICD-10-CM

## 2019-12-20 DIAGNOSIS — N400 Enlarged prostate without lower urinary tract symptoms: Secondary | ICD-10-CM | POA: Diagnosis present

## 2019-12-20 DIAGNOSIS — W19XXXA Unspecified fall, initial encounter: Secondary | ICD-10-CM | POA: Diagnosis not present

## 2019-12-20 DIAGNOSIS — I6523 Occlusion and stenosis of bilateral carotid arteries: Secondary | ICD-10-CM | POA: Diagnosis not present

## 2019-12-20 DIAGNOSIS — F17220 Nicotine dependence, chewing tobacco, uncomplicated: Secondary | ICD-10-CM | POA: Diagnosis present

## 2019-12-20 DIAGNOSIS — I2510 Atherosclerotic heart disease of native coronary artery without angina pectoris: Secondary | ICD-10-CM | POA: Diagnosis not present

## 2019-12-20 DIAGNOSIS — E1165 Type 2 diabetes mellitus with hyperglycemia: Secondary | ICD-10-CM | POA: Diagnosis present

## 2019-12-20 DIAGNOSIS — Z20822 Contact with and (suspected) exposure to covid-19: Secondary | ICD-10-CM | POA: Diagnosis present

## 2019-12-20 DIAGNOSIS — G40909 Epilepsy, unspecified, not intractable, without status epilepticus: Secondary | ICD-10-CM | POA: Diagnosis present

## 2019-12-20 DIAGNOSIS — R404 Transient alteration of awareness: Secondary | ICD-10-CM | POA: Diagnosis not present

## 2019-12-20 DIAGNOSIS — Z952 Presence of prosthetic heart valve: Secondary | ICD-10-CM

## 2019-12-20 DIAGNOSIS — R29810 Facial weakness: Secondary | ICD-10-CM | POA: Diagnosis present

## 2019-12-20 DIAGNOSIS — I618 Other nontraumatic intracerebral hemorrhage: Secondary | ICD-10-CM | POA: Diagnosis not present

## 2019-12-20 DIAGNOSIS — I639 Cerebral infarction, unspecified: Principal | ICD-10-CM

## 2019-12-20 DIAGNOSIS — L89152 Pressure ulcer of sacral region, stage 2: Secondary | ICD-10-CM | POA: Diagnosis not present

## 2019-12-20 DIAGNOSIS — Z87898 Personal history of other specified conditions: Secondary | ICD-10-CM | POA: Diagnosis not present

## 2019-12-20 DIAGNOSIS — I469 Cardiac arrest, cause unspecified: Secondary | ICD-10-CM | POA: Diagnosis not present

## 2019-12-20 DIAGNOSIS — I6622 Occlusion and stenosis of left posterior cerebral artery: Secondary | ICD-10-CM | POA: Diagnosis not present

## 2019-12-20 DIAGNOSIS — Z951 Presence of aortocoronary bypass graft: Secondary | ICD-10-CM | POA: Diagnosis not present

## 2019-12-20 DIAGNOSIS — I10 Essential (primary) hypertension: Secondary | ICD-10-CM | POA: Diagnosis not present

## 2019-12-20 DIAGNOSIS — Z6822 Body mass index (BMI) 22.0-22.9, adult: Secondary | ICD-10-CM

## 2019-12-20 DIAGNOSIS — I63312 Cerebral infarction due to thrombosis of left middle cerebral artery: Secondary | ICD-10-CM | POA: Diagnosis not present

## 2019-12-20 DIAGNOSIS — I6389 Other cerebral infarction: Secondary | ICD-10-CM | POA: Diagnosis not present

## 2019-12-20 LAB — ETHANOL: Alcohol, Ethyl (B): <10 mg/dL (ref <10)

## 2019-12-20 LAB — COMPREHENSIVE METABOLIC PANEL
ALT: 12 U/L (ref 0–44)
AST: 14 U/L — ABNORMAL LOW (ref 15–41)
Albumin: 2.7 g/dL — ABNORMAL LOW (ref 3.5–5.0)
Alkaline Phosphatase: 79 U/L (ref 38–126)
Anion gap: 12 (ref 5–15)
BUN: 40 mg/dL — ABNORMAL HIGH (ref 8–23)
CO2: 16 mmol/L — ABNORMAL LOW (ref 22–32)
Calcium: 10 mg/dL (ref 8.9–10.3)
Chloride: 107 mmol/L (ref 98–111)
Creatinine, Ser: 2.32 mg/dL — ABNORMAL HIGH (ref 0.61–1.24)
GFR calc Af Amer: 28 mL/min — ABNORMAL LOW (ref >60)
GFR calc non Af Amer: 24 mL/min — ABNORMAL LOW (ref >60)
Glucose, Bld: 205 mg/dL — ABNORMAL HIGH (ref 70–99)
Potassium: 5.1 mmol/L (ref 3.5–5.1)
Sodium: 135 mmol/L (ref 135–145)
Total Bilirubin: 0.4 mg/dL (ref 0.3–1.2)
Total Protein: 6 g/dL — ABNORMAL LOW (ref 6.5–8.1)

## 2019-12-20 LAB — I-STAT CHEM 8, ED
BUN: 36 mg/dL — ABNORMAL HIGH (ref 8–23)
Calcium, Ion: 1.29 mmol/L (ref 1.15–1.40)
Chloride: 110 mmol/L (ref 98–111)
Creatinine, Ser: 2.1 mg/dL — ABNORMAL HIGH (ref 0.61–1.24)
Glucose, Bld: 201 mg/dL — ABNORMAL HIGH (ref 70–99)
HCT: 32 % — ABNORMAL LOW (ref 39.0–52.0)
Hemoglobin: 10.9 g/dL — ABNORMAL LOW (ref 13.0–17.0)
Potassium: 5 mmol/L (ref 3.5–5.1)
Sodium: 135 mmol/L (ref 135–145)
TCO2: 15 mmol/L — ABNORMAL LOW (ref 22–32)

## 2019-12-20 LAB — CBC
HCT: 33.4 % — ABNORMAL LOW (ref 39.0–52.0)
Hemoglobin: 10.8 g/dL — ABNORMAL LOW (ref 13.0–17.0)
MCH: 28.3 pg (ref 26.0–34.0)
MCHC: 32.3 g/dL (ref 30.0–36.0)
MCV: 87.4 fL (ref 80.0–100.0)
Platelets: 329 10*3/uL (ref 150–400)
RBC: 3.82 MIL/uL — ABNORMAL LOW (ref 4.22–5.81)
RDW: 13.7 % (ref 11.5–15.5)
WBC: 6 10*3/uL (ref 4.0–10.5)
nRBC: 0 % (ref 0.0–0.2)

## 2019-12-20 LAB — DIFFERENTIAL
Abs Immature Granulocytes: 0.02 10*3/uL (ref 0.00–0.07)
Basophils Absolute: 0 10*3/uL (ref 0.0–0.1)
Basophils Relative: 0 %
Eosinophils Absolute: 0.1 10*3/uL (ref 0.0–0.5)
Eosinophils Relative: 1 %
Immature Granulocytes: 0 %
Lymphocytes Relative: 29 %
Lymphs Abs: 1.8 10*3/uL (ref 0.7–4.0)
Monocytes Absolute: 0.7 10*3/uL (ref 0.1–1.0)
Monocytes Relative: 12 %
Neutro Abs: 3.4 10*3/uL (ref 1.7–7.7)
Neutrophils Relative %: 58 %

## 2019-12-20 LAB — RAPID URINE DRUG SCREEN, HOSP PERFORMED
Amphetamines: NOT DETECTED
Barbiturates: NOT DETECTED
Benzodiazepines: NOT DETECTED
Cocaine: NOT DETECTED
Opiates: NOT DETECTED
Tetrahydrocannabinol: NOT DETECTED

## 2019-12-20 LAB — GLUCOSE, CAPILLARY: Glucose-Capillary: 156 mg/dL — ABNORMAL HIGH (ref 70–99)

## 2019-12-20 LAB — CBG MONITORING, ED: Glucose-Capillary: 179 mg/dL — ABNORMAL HIGH (ref 70–99)

## 2019-12-20 LAB — PROTIME-INR
INR: 1.1 (ref 0.8–1.2)
Prothrombin Time: 13.4 seconds (ref 11.4–15.2)

## 2019-12-20 LAB — APTT: aPTT: 33 seconds (ref 24–36)

## 2019-12-20 LAB — SARS CORONAVIRUS 2 BY RT PCR (HOSPITAL ORDER, PERFORMED IN ~~LOC~~ HOSPITAL LAB): SARS Coronavirus 2: NEGATIVE

## 2019-12-20 MED ORDER — LEVETIRACETAM 500 MG PO TABS: 500.0000 mg | ORAL_TABLET | Freq: Two times a day (BID) | ORAL | Status: DC

## 2019-12-20 MED ORDER — NICARDIPINE HCL IN NACL 20-0.86 MG/200ML-% IV SOLN: 0.0000 mg/h | INTRAVENOUS | Status: DC | PRN

## 2019-12-20 MED ORDER — LABETALOL HCL 5 MG/ML IV SOLN: 10.0000 mg | Freq: Once | INTRAVENOUS | Status: DC | PRN

## 2019-12-20 MED ORDER — ACETAMINOPHEN 650 MG RE SUPP
650.0000 mg | RECTAL | Status: DC | PRN
  Filled 2019-12-20: qty 1

## 2019-12-20 MED ORDER — ALTEPLASE (STROKE) FULL DOSE INFUSION
0.9000 mg/kg | Freq: Once | INTRAVENOUS | Status: AC
  Administered 2019-12-20: 68.6 mg via INTRAVENOUS
  Filled 2019-12-20: qty 100

## 2019-12-20 MED ORDER — ACETAMINOPHEN 160 MG/5ML PO SOLN
650.0000 mg | ORAL | Status: DC | PRN
  Administered 2019-12-27: 650 mg
  Filled 2019-12-20: qty 20.3

## 2019-12-20 MED ORDER — IOHEXOL 350 MG/ML SOLN
100.0000 mL | Freq: Once | INTRAVENOUS | Status: AC | PRN
  Administered 2019-12-20: 100 mL via INTRAVENOUS

## 2019-12-20 MED ORDER — STROKE: EARLY STAGES OF RECOVERY BOOK: Freq: Once | Status: AC

## 2019-12-20 MED ORDER — SODIUM CHLORIDE 0.9 % IV SOLN
50.0000 mL/h | INTRAVENOUS | Status: DC
  Administered 2019-12-20 – 2019-12-22 (×4): 50 mL/h via INTRAVENOUS

## 2019-12-20 MED ORDER — PANTOPRAZOLE SODIUM 40 MG IV SOLR
40.0000 mg | Freq: Every day | INTRAVENOUS | Status: DC
  Administered 2019-12-20 – 2019-12-23 (×4): 40 mg via INTRAVENOUS
  Filled 2019-12-20 (×3): qty 40

## 2019-12-20 MED ORDER — INSULIN ASPART 100 UNIT/ML ~~LOC~~ SOLN
0.0000 [IU] | SUBCUTANEOUS | Status: DC
  Administered 2019-12-21 (×3): 3 [IU] via SUBCUTANEOUS
  Administered 2019-12-21 – 2019-12-24 (×8): 2 [IU] via SUBCUTANEOUS
  Administered 2019-12-24: 8 [IU] via SUBCUTANEOUS
  Administered 2019-12-24: 3 [IU] via SUBCUTANEOUS
  Administered 2019-12-24: 8 [IU] via SUBCUTANEOUS
  Administered 2019-12-24: 5 [IU] via SUBCUTANEOUS
  Administered 2019-12-24: 2 [IU] via SUBCUTANEOUS
  Administered 2019-12-25: 8 [IU] via SUBCUTANEOUS
  Administered 2019-12-25: 11 [IU] via SUBCUTANEOUS
  Administered 2019-12-25: 15 [IU] via SUBCUTANEOUS
  Administered 2019-12-25 (×2): 5 [IU] via SUBCUTANEOUS
  Administered 2019-12-25 – 2019-12-26 (×2): 11 [IU] via SUBCUTANEOUS
  Administered 2019-12-26: 8 [IU] via SUBCUTANEOUS
  Administered 2019-12-26: 5 [IU] via SUBCUTANEOUS
  Administered 2019-12-26 (×2): 8 [IU] via SUBCUTANEOUS
  Administered 2019-12-27: 5 [IU] via SUBCUTANEOUS
  Administered 2019-12-27: 2 [IU] via SUBCUTANEOUS
  Administered 2019-12-27: 3 [IU] via SUBCUTANEOUS

## 2019-12-20 MED ORDER — CHLORHEXIDINE GLUCONATE CLOTH 2 % EX PADS
6.0000 | MEDICATED_PAD | Freq: Every day | CUTANEOUS | Status: DC
  Administered 2019-12-21: 6 via TOPICAL

## 2019-12-20 MED ORDER — SODIUM CHLORIDE 0.9 % IV SOLN
50.0000 mL | Freq: Once | INTRAVENOUS | Status: AC
  Administered 2019-12-20: 50 mL via INTRAVENOUS

## 2019-12-20 MED ORDER — ACETAMINOPHEN 325 MG PO TABS
650.0000 mg | ORAL_TABLET | ORAL | Status: DC | PRN
  Filled 2019-12-20: qty 2

## 2019-12-20 NOTE — Procedures (Signed)
Patient Name: OCIE TINO  MRN: [id number]  Epilepsy Attending: Lora Havens  Referring Physician/Provider: Dr Roland Rack Date: 12/17/2019 Duration: 27.18 mins  Patient history: 86yo M with remote h/o seizures who presented with sudden onset of aphasia and right hemiplegia.   Level of alertness: Awake  AEDs during EEG study: None  Technical aspects: This EEG study was done with scalp electrodes positioned according to the 10-20 International system of electrode placement. Electrical activity was acquired at a sampling rate of 500Hz  and reviewed with a high frequency filter of 70Hz  and a low frequency filter of 1Hz . EEG data were recorded continuously and digitally stored.   Description: No posterior dominant rhythm was seen. EEG showed continuous generalized 3 to 6 Hz theta-delta slowing. Hyperventilation and photic stimulation were not performed.     ABNORMALITY -Continuous slow, generalized  IMPRESSION: This study is suggestive of mild to moderate diffuse encephalopathy, nonspecific etiology. No seizures or epileptiform discharges were seen throughout the recording.  Vincen Bejar Barbra Sarks

## 2019-12-20 NOTE — H&P (Signed)
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 greater). Mixed soft and calcified plaque at the origin of the CCA, within the mid to distal CCA, within the carotid bifurcation and within the proximal ICA. Vertebral arteries: The vertebral arteries are patent within the neck bilaterally. The left vertebral artery is dominant. Calcified plaque at the origin of the right vertebral artery with moderate ostial stenosis. Nonstenotic mixed plaque at the origin of the dominant left vertebral artery. Skeleton: No acute bony abnormality or aggressive osseous lesion. Cervical spondylosis. Other neck: No neck mass or cervical lymphadenopathy. Upper chest: Redemonstrated opacity within the right lung apex, incompletely imaged on the current examination but measuring at least 5.4 cm. Prior median sternotomy. Review of the MIP images confirms the above findings CTA HEAD FINDINGS Anterior circulation: The intracranial internal carotid arteries are patent. Prominent calcified plaque within both vessels. Moderate/severe stenosis of the paraclinoid right ICA. Moderate stenosis of the paraclinoid left ICA. The M1 middle cerebral arteries are patent without significant stenosis. No M2 proximal branch occlusion or high-grade proximal stenosis is identified.  The anterior cerebral arteries are patent. No intracranial aneurysm is identified. Posterior circulation: The intracranial vertebral arteries are patent. Mild calcified plaque within these vessels without stenosis. The basilar artery is patent without significant stenosis. Predominantly fetal origin left posterior cerebral artery. The posterior cerebral arteries are patent. High-grade stenosis within the P2 left posterior cerebral artery. Posterior communicating arteries are present bilaterally. Venous sinuses: Within limitations of contrast timing, no convincing thrombus. Anatomic variants: As described Review of the MIP images confirms the above findings CT Brain Perfusion Findings: ASPECTS: 10 CBF (<30%) Volume: 24mL Perfusion (  Tmax>6.0s) volume: 55mL Mismatch Volume: 47mL Infarction Location:None identified No evidence of large vessel occlusion. These results were called by telephone at the time of interpretation on 12/19/2019 at 3:53 pm to provider Dr. Leonie Man, who verbally acknowledged these results. IMPRESSION: CTA neck: 1. The bilateral common and internal carotid arteries are patent within the neck without hemodynamically significant stenosis. Soft and calcified plaque within the bilateral carotid systems as described. 2. The vertebral arteries are patent within the neck bilaterally. Moderate atherosclerotic narrowing at the origin of the right vertebral artery. 3. Redemonstrated pulmonary opacity within the right upper lobe which is incompletely imaged on the current examination but measures at least 5.4 cm. This finding is nonspecific but malignancy remains a differential consideration. Follow-up contrast-enhanced chest CT or PET-CT should be considered for further evaluation CTA head: 1. No intracranial large vessel occlusion is identified. 2. High-grade stenosis within the P2 left posterior cerebral artery. 3. Calcified plaque within both intracranial internal carotid arteries. Moderate/severe stenosis of the  paraclinoid right ICA. Moderate stenosis of the paraclinoid left ICA. CT perfusion head: The perfusion software identifies no core infarct. The perfusion software identifies no critically hypoperfused parenchyma utilizing the Tmax>6 seconds threshold. Electronically Signed   By: Kellie Simmering DO   On: 12/16/2019 16:13    Assessment: 86 y.o. male presenting with sudden onset of aphasia and right hemiplegia likely from left hemispheric infarct.  CT angio and CT perfusion did not show large vessel occlusion or major tissue at risk.  He does have remote history of seizures but according to daughter has not had seizures for the last 2 years and has never had any postictal weakness I have discussed risk benefits of IV TPA with the patient's daughter including slightly high risk given his advanced age.  I reviewed contraindications and believe given his profoundly disabling deficits administration of TPA is beneficial.  I also discussed the case with Dr. Earleen Newport interventional neuroradiologist who reviewed the films with me and we do not believe patient has any lesion which is amenable to endovascular treatment.    Plan: Admit to neurological intensive care unit.  Close neurological monitoring.  Strict blood pressure control as per post TPA protocol.  Check r MRI scan of the brain tomorrow.  Check echocardiogram.  Check hemoglobin A1c, lipid profile.  Physical occupational and speech therapy consults.  Discuss goals of care meeting with family tomorrow if patient does not show any improvement.  I have advised the family to strictly consider DNR and may need palliative care consult later.  Continue Keppra for seizures and check EEG tomorrow.  Discussed with patient's daughter over the phone and subsequently in person after she arrived.  Discussed with Dr. Francia Greaves This patient is critically ill and at significant risk of neurological worsening, death and care requires constant monitoring of vital signs,  hemodynamics,respiratory and cardiac monitoring, extensive review of multiple databases, frequent neurological assessment, discussion with family, other specialists and medical decision making of high complexity.I have made any additions or clarifications directly to the above note.This critical care time does not reflect procedure time, or teaching time or supervisory time of PA/NP/Med Resident etc but could involve care discussion time.  I spent 80 minutes of neurocritical care time  in the care of  this patient.     Antony Contras, MD 12/08/2019, 4:20 PM

## 2019-12-20 NOTE — ED Notes (Signed)
EEG at bedside.

## 2019-12-20 NOTE — ED Triage Notes (Signed)
Pt arrives from home with [facility] as CODE STROKE. LSN 1100. Pt having right sided facial droop and right sided weakness.

## 2019-12-20 NOTE — Progress Notes (Signed)
EEG complete - results pending 

## 2019-12-20 NOTE — Code Documentation (Signed)
Pt is a 86 yr old man brought in by [facility] at 1504. He was last know to be at his baseline at 1100 today. When he awoke from his nap a few minutes after two, he was flaccid on the right and had difficulty speaking. At the bridge, pt was almost mute, and had right sided weakness and rt droop. (NIHSS 22, see flowsheet for details). Pt taken to CT at 1515. NCCT negative for bleed. CBG179, BP 118/67.  Dr Leonie Man spoke with family member [name] who provided more history and consented to [facility]. TPA 68.6 mg started at 1521. Pt still not moving right side, and not speaking. CTA ordered, and eventually CTP as well. Dr Earleen Newport and Dr Leonie Man in discussions with family and radiologist. It was determined that pt did not have an LVO,, therefore he is not a candidate for IR. Pt taken to [place] where we were met by another of pt's daughters. Gradually pt began speaking more, and consistently following commands. He is still unable to move his right side. (NIHSS 15). Pt will need VS and mNIHSS q 15 min for another hour (until 1730), then q 30 min for 6 hr. Bedside handoff complete with Corrie Dandy.

## 2019-12-20 NOTE — ED Provider Notes (Signed)
[facility] EMERGENCY DEPARTMENT Provider Note   CSN: [id number] Arrival date & time: 12/21/2019  1504     History Chief Complaint  Patient presents with  . Code Stroke    Matthew Bruce is a 86 y.o. male.  86 year old male with prior medical history detailed below presents for evaluation of code stroke.  Patient was last known normal around 2:10 PM.  Patient was noted to be less responsive with difficult to understand speech and new right-sided weakness.  EMS initiated code stroke.  Neuro team saw the patient on arrival.  Patient was immediately transferred to CT.  Upon my evaluation in the exam room TPA had already been initiated.  Patient will be admitted to the Neuro Service for further treatment of suspected Acute CVA.   The history is provided by the EMS personnel and medical records.  Illness Location:  Acute weakness - suspected CVA Severity:  Severe Onset quality:  Sudden Duration:  1 hour Timing:  Constant Progression:  Unchanged Chronicity:  New      Past Medical History:  Diagnosis Date  . Anemia   . Aortic valve disorder   . Coronary artery disease   . Diabetes mellitus without complication ([place])   . Glaucoma   . History of BPH   . History of kidney stones   . HOH (hard of hearing)    Bilateral Hearing Aids  . Hyperlipidemia   . Hypertension   . Kidney stones   . MRSA (methicillin resistant staph aureus) culture positive 04/01/2016   LEFT FOOT  . Myocardial infarction ([place])   . Neuropathy   . Peripheral vascular disease [facility])     Patient Active Problem List   Diagnosis Date Noted  . Stroke (cerebrum) ([place]) 12/13/2019  . CKD (chronic kidney disease), stage IV ([place]) 11/03/2019  . UTI (urinary tract infection) 11/03/2019  . Lung mass 11/03/2019  . Sepsis ([place]) 07/03/2018  . Seizure ([place]) 09/07/2016  . Pressure injury of skin 05/27/2016  . Elevated lactic acid level 05/26/2016  . Weakness 05/26/2016  . PAD (peripheral artery  disease) ([place]) 03/24/2016  . Right foot ulcer ([place]) 03/24/2016  . Essential hypertension 02/02/2016  . Diabetes ([place]) 02/02/2016  . Atherosclerotic peripheral vascular disease with ulceration ([place]) 02/02/2016  . Valvular heart disease 02/02/2016  . Embolic stroke involving right cerebellar artery ([place]) 11/10/2014  . Hypoglycemia 11/05/2014  . Microalbuminuria 11/07/2013  . Other and unspecified hyperlipidemia 11/07/2013  . Simple chronic anemia 11/07/2013  . Type II diabetes mellitus, uncontrolled ([place]) 11/07/2013    Past Surgical History:  Procedure Laterality Date  . AMPUTATION TOE Left 02/11/2016   Procedure: AMPUTATION TOE;  Surgeon: Albertine Patricia, DPM;  Location: [facility] ORS;  Service: Podiatry;  Laterality: Left;  . AMPUTATION TOE Left 08/19/2016   Procedure: AMPUTATION TOE;  Surgeon: Albertine Patricia, DPM;  Location: [facility] ORS;  Service: Podiatry;  Laterality: Left;  . APLIGRAFT PLACEMENT Right 08/19/2016   Procedure: APLIGRAFT PLACEMENT;  Surgeon: Albertine Patricia, DPM;  Location: [facility] ORS;  Service: Podiatry;  Laterality: Right;  . APPLICATION OF WOUND VAC Left 04/01/2016   Procedure: APPLICATION OF WOUND VAC;  Surgeon: Albertine Patricia, DPM;  Location: [facility] ORS;  Service: Podiatry;  Laterality: Left;  . CARDIAC CATHETERIZATION    . CARDIAC SURGERY    . CARDIAC VALVE REPLACEMENT    . CORONARY ARTERY BYPASS GRAFT    . CYSTOSCOPY W/ RETROGRADES Right 07/04/2018   Procedure: CYSTOSCOPY WITH RETROGRADE PYELOGRAM;  Surgeon: Billey Co, MD;  Location: [facility] ORS;  Service: Urology;  Laterality: Right;  . CYSTOSCOPY WITH STENT PLACEMENT Right 07/04/2018   Procedure: CYSTOSCOPY WITH STENT PLACEMENT;  Surgeon: Billey Co, MD;  Location: [facility] OR[facility];  Service: Urology;  Laterality: Right;  . CYSTOSCOPY/URETEROSCOPY/HOLMIUM LASER/STENT PLACEMENT Right 09/10/2018   Procedure: CYSTOSCOPY/URETEROSCOPY/HOLMIUM LASER/STENT PLACEMENT;  Surgeon: Billey Co, MD;  Location: [facility] ORS;  Service:  Urology;  Laterality: Right;  . INCISION AND DRAINAGE Right 08/19/2016   Procedure: IRRIGATION AND DEBRIDEMENT RIGHT GREAT TOE;  Surgeon: Albertine Patricia, DPM;  Location: [facility] OR[facility];  Service: Podiatry;  Laterality: Right;  . IRRIGATION AND DEBRIDEMENT FOOT Left 04/01/2016   Procedure: IRRIGATION AND DEBRIDEMENT FOOT;  Surgeon: Albertine Patricia, DPM;  Location: [facility];  Service: Podiatry;  Laterality: Left;  . IRRIGATION AND DEBRIDEMENT FOOT Left 05/13/2016   Procedure: IRRIGATION AND DEBRIDEMENT FOOT;  Surgeon: Albertine Patricia, DPM;  Location: [facility];  Service: Podiatry;  Laterality: Left;  . LOWER EXTREMITY ANGIOGRAPHY Right 07/06/2016   Procedure: Lower Extremity Angiography;  Surgeon: Algernon Huxley, MD;  Location: [place];  Service: Cardiovascular;  Laterality: Right;  . LOWER EXTREMITY ANGIOGRAPHY Right 07/18/2016   Procedure: Lower Extremity Angiography;  Surgeon: Algernon Huxley, MD;  Location: [place] [facility];  Service: Cardiovascular;  Laterality: Right;  . OSTECTOMY Left 05/13/2016   Procedure: OSTECTOMY;  Surgeon: Albertine Patricia, DPM;  Location: [facility];  Service: Podiatry;  Laterality: Left;  . PERIPHERAL VASCULAR CATHETERIZATION Left 02/03/2016   Procedure: Lower Extremity Angiography;  Surgeon: Algernon Huxley, MD;  Location: [place] [facility];  Service: Cardiovascular;  Laterality: Left;  . PERIPHERAL VASCULAR CATHETERIZATION Left 04/01/2016   Procedure: Lower Extremity Angiography;  Surgeon: Katha Cabal, MD;  Location: [place] [facility];  Service: Cardiovascular;  Laterality: Left;  Marland Kitchen VALVE REPLACEMENT  2007   Aortic Valve Replacement, St. Jude Porcine Valve       Family History  Problem Relation Age of Onset  . Heart attack Mother   . Alcohol abuse Father   . Throat cancer Sister   . Heart attack Brother   . Heart failure Neg Hx     Social History   Tobacco Use  . Smoking status: Former Smoker    Packs/day: 1.00    Types: Cigarettes  . Smokeless  tobacco: Current User    Types: Chew  Vaping Use  . Vaping Use: Never used  Substance Use Topics  . Alcohol use: No  . Drug use: No    Home Medications Prior to Admission medications   Medication Sig Start Date End Date Taking? Authorizing Provider  amLODipine (NORVASC) 5 MG tablet Take 5 mg by mouth 2 (two) times daily. 08/25/16   [provider]  clopidogrel (PLAVIX) 75 MG tablet TAKE 1 TABLET BY MOUTH EVERY DAY Patient taking differently: Take 75 mg by mouth daily.  04/27/18   Algernon Huxley, MD  collagenase (SANTYL) ointment Apply topically daily. 11/06/19   Fritzi Mandes, MD  finasteride (PROSCAR) 5 MG tablet Take 5 mg by mouth at bedtime.     [provider]  glipiZIDE (GLUCOTROL) 5 MG tablet Take 5 mg by mouth daily. 08/17/16   [provider]  ibuprofen (ADVIL,MOTRIN) 800 MG tablet Take 800 mg by mouth daily.  02/22/18   [provider]  latanoprost (XALATAN) 0.005 % ophthalmic solution Place 1 drop into both eyes at bedtime.    [provider]  levETIRAcetam (KEPPRA) 500 MG tablet Take 1 tablet (  500 mg total) by mouth 2 (two) times daily. Patient not taking: Reported on 11/12/2019 09/08/16   Henreitta Leber, MD  lovastatin (MEVACOR) 40 MG tablet Take 40 mg by mouth daily.     [provider]  magnesium oxide (MAG-OX) 400 MG tablet Take 400 mg by mouth daily.    [provider]  metFORMIN (GLUCOPHAGE) 1000 MG tablet Take 1,000 mg by mouth 2 (two) times a day.     [provider]  tamsulosin (FLOMAX) 0.4 MG CAPS capsule Take 1 capsule (0.4 mg total) by mouth daily. 05/28/16   Demetrios Loll, MD    Allergies    Sulfa antibiotics and Macrobid [nitrofurantoin monohyd macro]  Review of Systems   Review of Systems  Unable to perform ROS: Acuity of condition    Physical Exam Updated Vital Signs BP 118/87   Pulse 89   Temp 99.7 F (37.6 C) (Axillary)   Resp 18   Wt 76.2 kg   SpO2 95%   BMI 22.78 kg/m   Physical  Exam Vitals and nursing note reviewed.  Constitutional:      General: He is not in acute distress.    Appearance: He is well-developed.  HENT:     Head: Normocephalic and atraumatic.  Eyes:     Conjunctiva/sclera: Conjunctivae normal.     Pupils: Pupils are equal, round, and reactive to light.  Cardiovascular:     Rate and Rhythm: Normal rate and regular rhythm.     Heart sounds: Normal heart sounds.  Pulmonary:     Effort: Pulmonary effort is normal. No respiratory distress.     Breath sounds: Normal breath sounds.  Abdominal:     General: There is no distension.     Palpations: Abdomen is soft.     Tenderness: There is no abdominal tenderness.  Musculoskeletal:        General: No deformity. Normal range of motion.     Cervical back: Normal range of motion and neck supple.  Skin:    General: Skin is warm and dry.  Neurological:     Comments: Aphasic  Dysarthric  Right sided flaccid paralysis     ED Results / Procedures / Treatments   Labs (all labs ordered are listed, but only abnormal results are displayed) Labs Reviewed  CBC - Abnormal; Notable for the following components:      Result Value   RBC 3.82 (*)    Hemoglobin 10.8 (*)    HCT 33.4 (*)    All other components within normal limits  COMPREHENSIVE METABOLIC PANEL - Abnormal; Notable for the following components:   CO2 16 (*)    Glucose, Bld 205 (*)    BUN 40 (*)    Creatinine, Ser 2.32 (*)    Total Protein 6.0 (*)    Albumin 2.7 (*)    AST 14 (*)    GFR calc non Af Amer 24 (*)    GFR calc Af Amer 28 (*)    All other components within normal limits  I-STAT CHEM 8, ED - Abnormal; Notable for the following components:   BUN 36 (*)    Creatinine, Ser 2.10 (*)    Glucose, Bld 201 (*)    TCO2 15 (*)    Hemoglobin 10.9 (*)    HCT 32.0 (*)    All other components within normal limits  CBG MONITORING, ED - Abnormal; Notable for the following components:   Glucose-Capillary 179 (*)    All other  components within normal limits  SARS CORONAVIRUS 2 BY RT PCR (HOSPITAL ORDER, [place] [facility])  ETHANOL  PROTIME-INR  APTT  DIFFERENTIAL  RAPID URINE DRUG SCREEN, HOSP PERFORMED  URINALYSIS, ROUTINE W REFLEX MICROSCOPIC    EKG None  Radiology CT HEAD CODE STROKE WO CONTRAST  Result Date: 12/04/2019 CLINICAL DATA:  Code stroke. Neuro deficit, acute, stroke suspected. EXAM: CT HEAD WITHOUT CONTRAST TECHNIQUE: Contiguous axial images were obtained from the base of the skull through the vertex without intravenous contrast. COMPARISON:  Noncontrast head CT 09/19/2019, brain MRI 09/07/2016. FINDINGS: Brain: Stable moderately advanced generalized cerebral atrophy. Stable, mild ill-defined hypoattenuation within the cerebral white matter which is nonspecific, but consistent with chronic small vessel ischemic disease. Known small chronic infarcts within the brainstem and cerebellar hemispheres, some of which were better appreciated on the prior MRI of 09/07/2016. There is no acute intracranial hemorrhage. No acute demarcated cortical infarct is identified. No extra-axial fluid collection. No evidence of intracranial mass. No midline shift. Vascular: No hyperdense vessel.  Atherosclerotic calcifications Skull: Normal. Negative for fracture or focal lesion. Sinuses/Orbits: Extensive partial opacification of the right maxillary sinus with associated chronic reactive osteitis. Partial opacification of bilateral ethmoid air cells. Small mucous retention cyst within the left sphenoid sinus. Bilateral mastoid effusions. ASPECTS [facility] Stroke Program Early CT Score) - Ganglionic level infarction (caudate, lentiform nuclei, internal capsule, insula, M1-M3 cortex): 7 - Supraganglionic infarction (M4-M6 cortex): 3 Total score (0-10 with 10 being normal): 10 These results were communicated to Dr. Leonie Man At 3:36 pmon 09/16/2021by text page via the [facility] messaging system. IMPRESSION: No CT evidence  of acute intracranial abnormality. ASPECTS is 10. Known small chronic infarcts within the brainstem and cerebellum, some of which were better appreciated on the prior MRI of 09/07/2016. Stable background mild cerebral white matter chronic small vessel ischemic disease and moderately advanced generalized cerebral atrophy. Ethmoid and right maxillary sinusitis. Bilateral mastoid effusions. Electronically Signed   By: Kellie Simmering DO   On: 12/12/2019 15:36    Procedures Procedures (including critical care time) CRITICAL CARE Performed by: Valarie Merino   Total critical care time: 30  minutes  Critical care time was exclusive of separately billable procedures and treating other patients.  Critical care was necessary to treat or prevent imminent or life-threatening deterioration.  Critical care was time spent personally by me on the following activities: development of treatment plan with patient and/or surrogate as well as nursing, discussions with consultants, evaluation of patient's response to treatment, examination of patient, obtaining history from patient or surrogate, ordering and performing treatments and interventions, ordering and review of laboratory studies, ordering and review of radiographic studies, pulse oximetry and re-evaluation of patient's condition.   Medications Ordered in ED Medications  alteplase (ACTIVASE) 1 mg/mL infusion 68.6 mg (has no administration in time range)    Followed by  0.9 %  sodium chloride infusion (has no administration in time range)   stroke: mapping our early stages of recovery book (has no administration in time range)  0.9 %  sodium chloride infusion (has no administration in time range)  acetaminophen (TYLENOL) tablet 650 mg (has no administration in time range)    Or  acetaminophen (TYLENOL) 160 MG/5ML solution 650 mg (has no administration in time range)    Or  acetaminophen (TYLENOL) suppository 650 mg (has no administration in time  range)  labetalol (NORMODYNE) injection 10 mg (has no administration in time range)    And  nicardipine (CARDENE)  20mg  in 0.86% saline 237ml IV infusion (0.1 mg/ml) (has no administration in time range)  pantoprazole (PROTONIX) injection 40 mg (has no administration in time range)  iohexol (OMNIPAQUE) 350 MG/ML injection 100 mL (100 mLs Intravenous Contrast Given 12/18/2019 1548)    ED Course  I have reviewed the triage vital signs and the nursing notes.  Pertinent labs & imaging results that were available during my care of the patient were reviewed by me and considered in my medical decision making (see chart for details).    MDM Rules/Calculators/A&P                          MDM  Screen complete  CASTLE LAMONS was evaluated in Emergency Department on 12/31/2019 for the symptoms described in the history of present illness. He was evaluated in the context of the global COVID-19 pandemic, which necessitated consideration that the patient might be at risk for infection with the SARS-CoV-2 virus that causes COVID-19. Institutional protocols and algorithms that pertain to the evaluation of patients at risk for COVID-19 are in a state of rapid change based on information released by regulatory bodies including the CDC and federal and state organizations. These policies and algorithms were followed during the patient's care in the ED.   Patient is presenting as a code stroke.  Patient deemed a candidate for TPA (given severity and acuity of symptoms).  Neuro team has initiated TPA.    Patient will be admitted to the neuro service for further treatment and evaluation.   Final Clinical Impression(s) / ED Diagnoses Final diagnoses:  Cerebrovascular accident (CVA), unspecified mechanism ([place])    Rx / DC Orders ED Discharge Orders    None       Valarie Merino, MD 12/21/2019 1706

## 2019-12-20 NOTE — Progress Notes (Signed)
PHARMACIST CODE STROKE RESPONSE  Notified to mix tPA at 1517 by Dr. Leonie Man Delivered tPA to RN at 1520 tPA administered at 1521  tPA dose = 6.9mg  bolus over 1 minute followed by 61.7mg  for a total dose of 68.6mg  over 1 hour  Issues/delays encountered (if applicable): none  Mercy Riding, PharmD PGY1 Acute Care Pharmacy Resident Please refer to [facility] for unit-specific pharmacist

## 2019-12-21 ENCOUNTER — Inpatient Hospital Stay (HOSPITAL_COMMUNITY): Payer: Medicare HMO

## 2019-12-21 DIAGNOSIS — N390 Urinary tract infection, site not specified: Secondary | ICD-10-CM

## 2019-12-21 DIAGNOSIS — I10 Essential (primary) hypertension: Secondary | ICD-10-CM

## 2019-12-21 DIAGNOSIS — Z87898 Personal history of other specified conditions: Secondary | ICD-10-CM

## 2019-12-21 DIAGNOSIS — Z8673 Personal history of transient ischemic attack (TIA), and cerebral infarction without residual deficits: Secondary | ICD-10-CM

## 2019-12-21 DIAGNOSIS — R1312 Dysphagia, oropharyngeal phase: Secondary | ICD-10-CM

## 2019-12-21 DIAGNOSIS — I6389 Other cerebral infarction: Secondary | ICD-10-CM

## 2019-12-21 DIAGNOSIS — E7800 Pure hypercholesterolemia, unspecified: Secondary | ICD-10-CM

## 2019-12-21 DIAGNOSIS — N184 Chronic kidney disease, stage 4 (severe): Secondary | ICD-10-CM

## 2019-12-21 DIAGNOSIS — I63312 Cerebral infarction due to thrombosis of left middle cerebral artery: Secondary | ICD-10-CM

## 2019-12-21 LAB — CBC
HCT: 33.1 % — ABNORMAL LOW (ref 39.0–52.0)
Hemoglobin: 10.7 g/dL — ABNORMAL LOW (ref 13.0–17.0)
MCH: 27.4 pg (ref 26.0–34.0)
MCHC: 32.3 g/dL (ref 30.0–36.0)
MCV: 84.9 fL (ref 80.0–100.0)
Platelets: 319 10*3/uL (ref 150–400)
RBC: 3.9 MIL/uL — ABNORMAL LOW (ref 4.22–5.81)
RDW: 14 % (ref 11.5–15.5)
WBC: 6.5 10*3/uL (ref 4.0–10.5)
nRBC: 0 % (ref 0.0–0.2)

## 2019-12-21 LAB — URINALYSIS, ROUTINE W REFLEX MICROSCOPIC
Bacteria, UA: NONE SEEN
Bilirubin Urine: NEGATIVE
Glucose, UA: NEGATIVE mg/dL
Ketones, ur: NEGATIVE mg/dL
Nitrite: NEGATIVE
Protein, ur: 100 mg/dL — AB
Specific Gravity, Urine: 1.038 — ABNORMAL HIGH (ref 1.005–1.030)
WBC, UA: >50 WBC/hpf — ABNORMAL HIGH (ref 0–5)
pH: 5 (ref 5.0–8.0)

## 2019-12-21 LAB — BASIC METABOLIC PANEL
Anion gap: 13 (ref 5–15)
BUN: 38 mg/dL — ABNORMAL HIGH (ref 8–23)
CO2: 14 mmol/L — ABNORMAL LOW (ref 22–32)
Calcium: 9.9 mg/dL (ref 8.9–10.3)
Chloride: 108 mmol/L (ref 98–111)
Creatinine, Ser: 2.35 mg/dL — ABNORMAL HIGH (ref 0.61–1.24)
GFR calc Af Amer: 27 mL/min — ABNORMAL LOW (ref >60)
GFR calc non Af Amer: 23 mL/min — ABNORMAL LOW (ref >60)
Glucose, Bld: 180 mg/dL — ABNORMAL HIGH (ref 70–99)
Potassium: 5 mmol/L (ref 3.5–5.1)
Sodium: 135 mmol/L (ref 135–145)

## 2019-12-21 LAB — LIPID PANEL
Cholesterol: 121 mg/dL (ref 0–200)
HDL: 37 mg/dL — ABNORMAL LOW (ref >40)
LDL Cholesterol: 56 mg/dL (ref 0–99)
Total CHOL/HDL Ratio: 3.3 RATIO
Triglycerides: 141 mg/dL (ref <150)
VLDL: 28 mg/dL (ref 0–40)

## 2019-12-21 LAB — ECHOCARDIOGRAM COMPLETE
AR max vel: 0.94 cm2
AV Area VTI: 1.33 cm2
AV Area mean vel: 0.88 cm2
AV Mean grad: 9.3 mmHg
AV Peak grad: 17.3 mmHg
Ao pk vel: 2.08 m/s
Area-P 1/2: 4.1 cm2
S' Lateral: 2.2 cm
Weight: 2687.85 oz

## 2019-12-21 LAB — GLUCOSE, CAPILLARY
Glucose-Capillary: 109 mg/dL — ABNORMAL HIGH (ref 70–99)
Glucose-Capillary: 115 mg/dL — ABNORMAL HIGH (ref 70–99)
Glucose-Capillary: 133 mg/dL — ABNORMAL HIGH (ref 70–99)
Glucose-Capillary: 155 mg/dL — ABNORMAL HIGH (ref 70–99)
Glucose-Capillary: 173 mg/dL — ABNORMAL HIGH (ref 70–99)

## 2019-12-21 LAB — HEMOGLOBIN A1C
Hgb A1c MFr Bld: 7.5 % — ABNORMAL HIGH (ref 4.8–5.6)
Mean Plasma Glucose: 168.55 mg/dL

## 2019-12-21 MED ORDER — LABETALOL HCL 5 MG/ML IV SOLN: 5.0000 mg | INTRAVENOUS | Status: DC | PRN

## 2019-12-21 MED ORDER — LEVETIRACETAM IN NACL 500 MG/100ML IV SOLN
500.0000 mg | Freq: Two times a day (BID) | INTRAVENOUS | Status: DC
  Administered 2019-12-21 – 2019-12-24 (×8): 500 mg via INTRAVENOUS
  Filled 2019-12-21 (×8): qty 100

## 2019-12-21 MED ORDER — ASPIRIN 300 MG RE SUPP
300.0000 mg | Freq: Every day | RECTAL | Status: DC
  Administered 2019-12-21: 300 mg via RECTAL
  Filled 2019-12-21 (×3): qty 1

## 2019-12-21 MED ORDER — ASPIRIN 300 MG RE SUPP
300.0000 mg | Freq: Every day | RECTAL | Status: DC
  Administered 2019-12-22 – 2019-12-23 (×2): 300 mg via RECTAL

## 2019-12-21 MED ORDER — SODIUM CHLORIDE 0.9 % IV SOLN
1.0000 g | INTRAVENOUS | Status: DC
  Administered 2019-12-21 – 2019-12-22 (×2): 1 g via INTRAVENOUS
  Filled 2019-12-21 (×3): qty 10

## 2019-12-21 MED ORDER — HEPARIN SODIUM (PORCINE) 5000 UNIT/ML IJ SOLN
5000.0000 [IU] | Freq: Three times a day (TID) | INTRAMUSCULAR | Status: DC
  Administered 2019-12-21 – 2019-12-27 (×17): 5000 [IU] via SUBCUTANEOUS
  Filled 2019-12-21 (×19): qty 1

## 2019-12-21 NOTE — Progress Notes (Signed)
Received patient from 4N ICU via bed; daughter at bedside; oriented patient and family member to room and unit routine. Bed alarm set for fall safety and explained to patient and family.

## 2019-12-21 NOTE — Evaluation (Signed)
Physical Therapy Evaluation Patient Details Name: Matthew Bruce MRN: [id number] DOB: 08-Aug-1926 Today's Date: 12/21/2019   History of Present Illness  [name] is a 86 y.o. male with history of CVA, type 2 diabetes mellitus, HTN, HLD, hearing impaired (bilateral hearing aids), CAD (MI - CABG), BPH, ASPVD, AVR (St Jude - Porcine) seizure disorder, right heel ulcer, and chronic kidney disease stage IV presenting with speech difficulties and right sided weakness.  Recieved IV TPA.  MRI positive for left BG and CR infarct as well as punctate right frontal WM infarct  Clinical Impression  Patient presents with decreased mobility due to R side weakness, decreased balance, decreased activity tolerance and decreased cognition.  Currently max A +2 for most mobility tasks at EOB and attempted standing.  Evidently was able to stand with walker previously to transfer, but was limited in activity at home.  Could potentially go home with follow up HHPT if family can provide support with extra equipment (hospital bed/hoyer lift), but may need SNF if family unable to keep caring for home at home.  PT to continue to follow acutely.     Follow Up Recommendations Supervision/Assistance - 24 hour;Home health PT;SNF (depending on family preference/assist available)    Equipment Recommendations  Other (comment);Hospital bed (may need hospital bed/hoyer lift, no family to determine equipment needs)    Recommendations for Other Services       Precautions / Restrictions Precautions Precautions: Fall      Mobility  Bed Mobility Overal bed mobility: Needs Assistance Bed Mobility: Rolling;Sidelying to Sit;Sit to Supine Rolling: Mod assist;+2 for physical assistance Sidelying to sit: Max assist;+2 for physical assistance   Sit to supine: +2 for physical assistance;Total assist   General bed mobility comments: assist to initiate rolling for R knee flexion and assist with pad, but pt initiating  once assisted and hand on rail, time given to try and lift trunk, but needed A +2 for success; to supine assist for all aspects  Transfers Overall transfer level: Needs assistance   Transfers: Sit to/from Stand Sit to Stand: +2 physical assistance;Total assist         General transfer comment: attempted x 2 to stand pt able to take weight second attempt, but remained flexed forward at head and neck and trunk  Ambulation/Gait                Stairs            Wheelchair Mobility    Modified Rankin (Stroke Patients Only) Modified Rankin (Stroke Patients Only) Pre-Morbid Rankin Score: Severe disability Modified Rankin: Severe disability     Balance Overall balance assessment: Needs assistance Sitting-balance support: Feet supported Sitting balance-Leahy Scale: Poor Sitting balance - Comments: able to sit with S after work on balance at EOB and increased time and placed L hand on bed for support, lasted about 10 seconds prior to needing min A for safety Postural control: Other (comment) (flexion of head and neck risk for anterior LOB)   Standing balance-Leahy Scale: Zero Standing balance comment: +2 A for attempted standing                             Pertinent Vitals/Pain Pain Assessment: Faces Faces Pain Scale: No hurt    Home Living Family/patient expects to be discharged to:: Private residence Living Arrangements: Children Available Help at Discharge: Family Type of Home: House  Home Equipment: [place] - 2 wheels;Wheelchair - manual;Shower seat;Grab bars - tub/shower Additional Comments: info from prior admission    Prior Function Level of Independence: Needs assistance   Gait / Transfers Assistance Needed: pt not verbalizing history, per chart was able to stand with a walker with family assist           Hand Dominance   Dominant Hand: Right    Extremity/Trunk Assessment   Upper Extremity Assessment Upper Extremity  Assessment: RUE deficits/detail;LUE deficits/detail RUE Deficits / Details: did not squeeze my hand with increased time and attempts, but lifted arm from shoulder briefly LUE Deficits / Details: generally weak and limited shoulder elevation to about 70 on his own    Lower Extremity Assessment Lower Extremity Assessment: RLE deficits/detail;LLE deficits/detail RLE Deficits / Details: stiff overall with PROM, but able to activate some, weaker than L RLE Coordination: decreased gross motor LLE Deficits / Details: AROM generally stiff, but moves functionally, strength knee extension 3+/5, hip flexion 3-/5 LLE Coordination: decreased gross motor    Cervical / Trunk Assessment Cervical / Trunk Assessment: Kyphotic;Other exceptions Cervical / Trunk Exceptions: limited head and neck elevation in sitting  Communication   Communication: HOH;Expressive difficulties (dysarthria, limited verbalizations)  Cognition Arousal/Alertness: Lethargic Behavior During Therapy: Flat affect Overall Cognitive Status: Impaired/Different from baseline Area of Impairment: Following commands;Attention;Problem solving                   Current Attention Level: Sustained;Focused   Following Commands: Follows one step commands with increased time;Follows one step commands inconsistently     Problem Solving: Slow processing;Decreased initiation;Difficulty sequencing;Requires verbal cues;Requires tactile cues General Comments: Did not respond to orientation questions      General Comments General comments (skin integrity, edema, etc.): drooling while at EOB, pt did wipe his mouth once L hand brought to his mouth assisted with cloth; verbalized only "yes" twice in response to feeling better once back in supine and that he was tired    Exercises     Assessment/Plan    PT Assessment Patient needs continued PT services  PT Problem List Decreased strength;Decreased mobility;Decreased safety  awareness;Decreased balance;Decreased cognition;Decreased activity tolerance;Decreased knowledge of precautions       PT Treatment Interventions DME instruction;Therapeutic activities;Therapeutic exercise;Patient/family education;Balance training;Functional mobility training    PT Goals (Current goals can be found in the Care Plan section)  Acute Rehab PT Goals Patient Stated Goal: none stated PT Goal Formulation: Patient unable to participate in goal setting Time For Goal Achievement: 01/04/20 Potential to Achieve Goals: Fair    Frequency Min 3X/week   Barriers to discharge        Co-evaluation               AM-PAC PT "6 Clicks" Mobility  Outcome Measure Help needed turning from your back to your side while in a flat bed without using bedrails?: Total Help needed moving from lying on your back to sitting on the side of a flat bed without using bedrails?: Total Help needed moving to and from a bed to a chair (including a wheelchair)?: Total Help needed standing up from a chair using your arms (e.g., wheelchair or bedside chair)?: Total Help needed to walk in hospital room?: Total Help needed climbing 3-5 steps with a railing? : Total 6 Click Score: 6    End of Session Equipment Utilized During Treatment: Gait belt Activity Tolerance: Patient limited by fatigue Patient left: in bed;with call bell/phone within reach;with bed alarm set  Nurse Communication: Mobility status PT Visit Diagnosis: Other abnormalities of gait and mobility (R26.89);Other symptoms and signs involving the nervous system (R29.898);Hemiplegia and hemiparesis Hemiplegia - Right/Left: Right Hemiplegia - dominant/non-dominant: Dominant Hemiplegia - caused by: Cerebral infarction    Time: 1205-1235 PT Time Calculation (min) (ACUTE ONLY): 30 min   Charges:   PT Evaluation $PT Eval Moderate Complexity: 1 Mod PT Treatments $Therapeutic Activity: 8-22 mins        Magda Kiel, PT Acute  Rehabilitation Services ITUYW:[phone] Office:[phone] 12/21/2019   Reginia Naas 12/21/2019, 5:56 PM

## 2019-12-21 NOTE — Progress Notes (Signed)
OT Cancellation Note  Patient Details Name: Matthew Bruce MRN: [id number] DOB: 11/07/1926   Cancelled Treatment:    Reason Eval/Treat Not Completed: Active bedrest order. Will return as schedule allows. Thank you.  [place], OTR/L Acute Rehab Pager: [phone] Office: [phone] 12/21/2019, 8:01 AM

## 2019-12-21 NOTE — Evaluation (Signed)
Clinical/Bedside Swallow Evaluation Patient Details  Name: Matthew Bruce MRN: [id number] Date of Birth: Mar 12, 1927  Today's Date: 12/21/2019 Time: SLP Start Time (ACUTE ONLY): 5916 SLP Stop Time (ACUTE ONLY): 3846 SLP Time Calculation (min) (ACUTE ONLY): 12 min  Past Medical History:  Past Medical History:  Diagnosis Date  . Anemia   . Aortic valve disorder   . Coronary artery disease   . Diabetes mellitus without complication ([place])   . Glaucoma   . History of BPH   . History of kidney stones   . HOH (hard of hearing)    Bilateral Hearing Aids  . Hyperlipidemia   . Hypertension   . Kidney stones   . MRSA (methicillin resistant staph aureus) culture positive 04/01/2016   LEFT FOOT  . Myocardial infarction ([place])   . Neuropathy   . Peripheral vascular disease [facility])    Past Surgical History:  Past Surgical History:  Procedure Laterality Date  . AMPUTATION TOE Left 02/11/2016   Procedure: AMPUTATION TOE;  Surgeon: Albertine Patricia, DPM;  Location: [facility];  Service: Podiatry;  Laterality: Left;  . AMPUTATION TOE Left 08/19/2016   Procedure: AMPUTATION TOE;  Surgeon: Albertine Patricia, DPM;  Location: [facility];  Service: Podiatry;  Laterality: Left;  . APLIGRAFT PLACEMENT Right 08/19/2016   Procedure: APLIGRAFT PLACEMENT;  Surgeon: Albertine Patricia, DPM;  Location: [facility];  Service: Podiatry;  Laterality: Right;  . APPLICATION OF WOUND VAC Left 04/01/2016   Procedure: APPLICATION OF WOUND VAC;  Surgeon: Albertine Patricia, DPM;  Location: [facility];  Service: Podiatry;  Laterality: Left;  . CARDIAC CATHETERIZATION    . CARDIAC SURGERY    . CARDIAC VALVE REPLACEMENT    . CORONARY ARTERY BYPASS GRAFT    . CYSTOSCOPY W/ RETROGRADES Right 07/04/2018   Procedure: CYSTOSCOPY WITH RETROGRADE PYELOGRAM;  Surgeon: Billey Co, MD;  Location: [facility];  Service: Urology;  Laterality: Right;  . CYSTOSCOPY WITH STENT PLACEMENT Right 07/04/2018   Procedure: CYSTOSCOPY WITH STENT PLACEMENT;   Surgeon: Billey Co, MD;  Location: [facility];  Service: Urology;  Laterality: Right;  . CYSTOSCOPY/URETEROSCOPY/HOLMIUM LASER/STENT PLACEMENT Right 09/10/2018   Procedure: CYSTOSCOPY/URETEROSCOPY/HOLMIUM LASER/STENT PLACEMENT;  Surgeon: Billey Co, MD;  Location: [facility];  Service: Urology;  Laterality: Right;  . INCISION AND DRAINAGE Right 08/19/2016   Procedure: IRRIGATION AND DEBRIDEMENT RIGHT GREAT TOE;  Surgeon: Albertine Patricia, DPM;  Location: [facility];  Service: Podiatry;  Laterality: Right;  . IRRIGATION AND DEBRIDEMENT FOOT Left 04/01/2016   Procedure: IRRIGATION AND DEBRIDEMENT FOOT;  Surgeon: Albertine Patricia, DPM;  Location: [facility];  Service: Podiatry;  Laterality: Left;  . IRRIGATION AND DEBRIDEMENT FOOT Left 05/13/2016   Procedure: IRRIGATION AND DEBRIDEMENT FOOT;  Surgeon: Albertine Patricia, DPM;  Location: [facility];  Service: Podiatry;  Laterality: Left;  . LOWER EXTREMITY ANGIOGRAPHY Right 07/06/2016   Procedure: Lower Extremity Angiography;  Surgeon: Algernon Huxley, MD;  Location: [place] [facility];  Service: Cardiovascular;  Laterality: Right;  . LOWER EXTREMITY ANGIOGRAPHY Right 07/18/2016   Procedure: Lower Extremity Angiography;  Surgeon: Algernon Huxley, MD;  Location: [place] [facility];  Service: Cardiovascular;  Laterality: Right;  . OSTECTOMY Left 05/13/2016   Procedure: OSTECTOMY;  Surgeon: Albertine Patricia, DPM;  Location: [facility];  Service: Podiatry;  Laterality: Left;  . PERIPHERAL VASCULAR CATHETERIZATION Left 02/03/2016   Procedure: Lower Extremity Angiography;  Surgeon: Algernon Huxley, MD;  Location: [place] [facility];  Service: Cardiovascular;  Laterality: Left;  . PERIPHERAL VASCULAR  CATHETERIZATION Left 04/01/2016   Procedure: Lower Extremity Angiography;  Surgeon: Katha Cabal, MD;  Location: [place] [facility];  Service: Cardiovascular;  Laterality: Left;  Marland Kitchen VALVE REPLACEMENT  2007   Aortic Valve Replacement, St. Jude Porcine Valve   HPI:  Matthew Bruce is an 86 y.o. male with medical history significant for history of CVA, type 2 diabetes mellitus, BPH, seizure disorder, right heel ulcer, and chronic kidney disease stage IV who [facility] as a code stroke with right sided weakness and aphasia. Head CT negative for acute CVA, MRI pending.    Assessment / Plan / Recommendation Clinical Impression  Bedside swallow evaluation complete. Patient presents with evidence of an oropharyngeal dysphagia characterized by decreased labial seal, anterior spillage of bolus on the right, and delayed oral transit of bolus with notable increased WOB, followed by audible cough post swallow. Po trials stopped following ice chips. Will continue to f/u at bedside for readiness for instrumental exam to evaluate swallowing physiology and determine potential to initiate a po diet.  SLP Visit Diagnosis: Dysarthria and anarthria (R47.1);Dysphagia, oropharyngeal phase (R13.12)    Aspiration Risk  Severe aspiration risk    Diet Recommendation NPO   Medication Administration: Via alternative means    Other  Recommendations Oral Care Recommendations: Oral care QID   Follow up Recommendations Skilled Nursing facility      Frequency and Duration min 3x week  2 weeks       Prognosis        Swallow Study   General HPI: Matthew Bruce is an 86 y.o. male with medical history significant for history of CVA, type 2 diabetes mellitus, BPH, seizure disorder, right heel ulcer, and chronic kidney disease stage IV who was [facility] as a code stroke with right sided weakness and aphasia. Head CT negative for acute CVA, MRI pending.  Type of Study: Bedside Swallow Evaluation Previous Swallow Assessment: none Diet Prior to this Study: NPO Temperature Spikes Noted: No Respiratory Status: Room air History of Recent Intubation: No Behavior/Cognition: Lethargic/Drowsy;Cooperative Oral Cavity Assessment: Within Functional  Limits Oral Care Completed by SLP: Recent completion by staff Oral Cavity - Dentition: Adequate natural dentition Vision: Functional for self-feeding Self-Feeding Abilities: Needs assist Patient Positioning: Upright in bed Baseline Vocal Quality: Breathy;Low vocal intensity;Hoarse Volitional Cough: Weak Volitional Swallow: Able to elicit    Oral/Motor/Sensory Function Overall Oral Motor/Sensory Function: Moderate impairment Facial ROM: Reduced right;Suspected CN VII (facial) dysfunction Facial Symmetry: Abnormal symmetry right;Suspected CN VII (facial) dysfunction Facial Strength: Reduced right;Suspected CN VII (facial) dysfunction Lingual ROM: Within Functional Limits Lingual Symmetry: Within Functional Limits Lingual Strength: Reduced Lingual Sensation: Within Functional Limits Velum:  (difficult to view) Mandible: Within Functional Limits   Ice Chips Ice chips: Impaired Presentation: Spoon Oral Phase Impairments: Reduced labial seal;Impaired mastication Oral Phase Functional Implications: Right anterior spillage;Prolonged oral transit Pharyngeal Phase Impairments: Suspected delayed Swallow;Cough - Delayed   Thin Liquid Thin Liquid: Not tested    Nectar Thick Nectar Thick Liquid: Not tested   Honey Thick Honey Thick Liquid: Not tested   Puree Puree: Not tested   Solid     Solid: Not tested     Gabriel Rainwater MA, CCC-SLP   Matthew Bruce 12/21/2019,1:46 PM

## 2019-12-21 NOTE — Progress Notes (Signed)
*  PRELIMINARY RESULTS* Echocardiogram 2D Echocardiogram has been performed.  Matthew Bruce 12/21/2019, 2:27 PM

## 2019-12-21 NOTE — Evaluation (Signed)
Speech Language Pathology Evaluation Patient Details Name: Matthew Bruce MRN: [id number] DOB: Jan 31, 1927 Today's Date: 12/21/2019 Time: 1250-1305 SLP Time Calculation (min) (ACUTE ONLY): 15 min  Problem List:  Patient Active Problem List   Diagnosis Date Noted  . Stroke (cerebrum) ([place]) 12/19/2019  . CKD (chronic kidney disease), stage IV ([place]) 11/03/2019  . UTI (urinary tract infection) 11/03/2019  . Lung mass 11/03/2019  . Sepsis ([place]) 07/03/2018  . Seizure ([place]) 09/07/2016  . Pressure injury of skin 05/27/2016  . Elevated lactic acid level 05/26/2016  . Weakness 05/26/2016  . PAD (peripheral artery disease) ([place]) 03/24/2016  . Right foot ulcer ([place]) 03/24/2016  . Essential hypertension 02/02/2016  . Diabetes ([place]) 02/02/2016  . Atherosclerotic peripheral vascular disease with ulceration ([place]) 02/02/2016  . Valvular heart disease 02/02/2016  . Embolic stroke involving right cerebellar artery ([place]) 11/10/2014  . Hypoglycemia 11/05/2014  . Microalbuminuria 11/07/2013  . Other and unspecified hyperlipidemia 11/07/2013  . Simple chronic anemia 11/07/2013  . Type II diabetes mellitus, uncontrolled ([place]) 11/07/2013   Past Medical History:  Past Medical History:  Diagnosis Date  . Anemia   . Aortic valve disorder   . Coronary artery disease   . Diabetes mellitus without complication ([place])   . Glaucoma   . History of BPH   . History of kidney stones   . HOH (hard of hearing)    Bilateral Hearing Aids  . Hyperlipidemia   . Hypertension   . Kidney stones   . MRSA (methicillin resistant staph aureus) culture positive 04/01/2016   LEFT FOOT  . Myocardial infarction ([place])   . Neuropathy   . Peripheral vascular disease [facility])    Past Surgical History:  Past Surgical History:  Procedure Laterality Date  . AMPUTATION TOE Left 02/11/2016   Procedure: AMPUTATION TOE;  Surgeon: Albertine Patricia, DPM;  Location: [facility];  Service: Podiatry;  Laterality: Left;  . AMPUTATION  TOE Left 08/19/2016   Procedure: AMPUTATION TOE;  Surgeon: Albertine Patricia, DPM;  Location: [facility];  Service: Podiatry;  Laterality: Left;  . APLIGRAFT PLACEMENT Right 08/19/2016   Procedure: APLIGRAFT PLACEMENT;  Surgeon: Albertine Patricia, DPM;  Location: [facility];  Service: Podiatry;  Laterality: Right;  . APPLICATION OF WOUND VAC Left 04/01/2016   Procedure: APPLICATION OF WOUND VAC;  Surgeon: Albertine Patricia, DPM;  Location: [facility];  Service: Podiatry;  Laterality: Left;  . CARDIAC CATHETERIZATION    . CARDIAC SURGERY    . CARDIAC VALVE REPLACEMENT    . CORONARY ARTERY BYPASS GRAFT    . CYSTOSCOPY W/ RETROGRADES Right 07/04/2018   Procedure: CYSTOSCOPY WITH RETROGRADE PYELOGRAM;  Surgeon: Billey Co, MD;  Location: [facility];  Service: Urology;  Laterality: Right;  . CYSTOSCOPY WITH STENT PLACEMENT Right 07/04/2018   Procedure: CYSTOSCOPY WITH STENT PLACEMENT;  Surgeon: Billey Co, MD;  Location: [facility];  Service: Urology;  Laterality: Right;  . CYSTOSCOPY/URETEROSCOPY/HOLMIUM LASER/STENT PLACEMENT Right 09/10/2018   Procedure: CYSTOSCOPY/URETEROSCOPY/HOLMIUM LASER/STENT PLACEMENT;  Surgeon: Billey Co, MD;  Location: [facility];  Service: Urology;  Laterality: Right;  . INCISION AND DRAINAGE Right 08/19/2016   Procedure: IRRIGATION AND DEBRIDEMENT RIGHT GREAT TOE;  Surgeon: Albertine Patricia, DPM;  Location: [facility];  Service: Podiatry;  Laterality: Right;  . IRRIGATION AND DEBRIDEMENT FOOT Left 04/01/2016   Procedure: IRRIGATION AND DEBRIDEMENT FOOT;  Surgeon: Albertine Patricia, DPM;  Location: [facility];  Service: Podiatry;  Laterality: Left;  . IRRIGATION AND DEBRIDEMENT FOOT Left 05/13/2016   Procedure: IRRIGATION AND DEBRIDEMENT  FOOT;  Surgeon: Albertine Patricia, DPM;  Location: [facility];  Service: Podiatry;  Laterality: Left;  . LOWER EXTREMITY ANGIOGRAPHY Right 07/06/2016   Procedure: Lower Extremity Angiography;  Surgeon: Algernon Huxley, MD;  Location: [place] [facility];   Service: Cardiovascular;  Laterality: Right;  . LOWER EXTREMITY ANGIOGRAPHY Right 07/18/2016   Procedure: Lower Extremity Angiography;  Surgeon: Algernon Huxley, MD;  Location: [place] [facility];  Service: Cardiovascular;  Laterality: Right;  . OSTECTOMY Left 05/13/2016   Procedure: OSTECTOMY;  Surgeon: Albertine Patricia, DPM;  Location: [facility];  Service: Podiatry;  Laterality: Left;  . PERIPHERAL VASCULAR CATHETERIZATION Left 02/03/2016   Procedure: Lower Extremity Angiography;  Surgeon: Algernon Huxley, MD;  Location: [place] [facility];  Service: Cardiovascular;  Laterality: Left;  . PERIPHERAL VASCULAR CATHETERIZATION Left 04/01/2016   Procedure: Lower Extremity Angiography;  Surgeon: Katha Cabal, MD;  Location: [place] [facility];  Service: Cardiovascular;  Laterality: Left;  Marland Kitchen VALVE REPLACEMENT  2007   Aortic Valve Replacement, St. Jude Porcine Valve   HPI:  Matthew Bruce is an 86 y.o. male with medical history significant for history of CVA, type 2 diabetes mellitus, BPH, seizure disorder, right heel ulcer, and chronic kidney disease stage IV who [facility] as a code stroke with right sided weakness and aphasia. Head CT negative for acute CVA, MRI pending.    Assessment / Plan / Recommendation Clinical Impression  Cognitive-linguistic evaluation complete. Patient alert and cooperative, affect flat and without any voluntary verbalizations. Daughter present and reports that patient does "not smile much and is a very quiet man" at baseline. Note that although patient only spoke at the word and short phrase level, he was without evidence of aphasia today, answering all clinician provided questions appropriately, following basic 1-step commands, and oriented to person, place, and time. Generally lethargic impacting overall communication as well. Did not formally assess additional areas of cognitiion today however patient sustained attention to therapists questions/tasks  for 30 minutes and was able to carryover previously taught infomration following a 10 minute intervening task. + for moderate dysarthria due to right sided facial weakness. SLP will f/u for cognition, dysarthria, and ongoing diagnostics of language function.     SLP Assessment  SLP Recommendation/Assessment: Patient needs continued Speech Lanaguage Pathology Services SLP Visit Diagnosis: Dysarthria and anarthria (R47.1)    Follow Up Recommendations  Skilled Nursing facility    Frequency and Duration min 3x week  2 weeks      SLP Evaluation Cognition  Overall Cognitive Status: Impaired/Different from baseline Arousal/Alertness: Lethargic Orientation Level: Oriented to person;Oriented to time;Oriented to place;Disoriented to situation Attention: Sustained Sustained Attention: Appears intact Memory:  (intact for basic information following a ten minute delay) Awareness: Impaired Awareness Impairment: Intellectual impairment Comments: communication limited, will continue to assess       Comprehension  Auditory Comprehension Overall Auditory Comprehension: Appears within functional limits for tasks assessed Yes/No Questions: Within Functional Limits Commands:  [facility] for 1 step commands) Conversation: Simple Visual Recognition/Discrimination Discrimination: Within Function Limits Reading Comprehension Reading Status: Not tested    Expression Expression Primary Mode of Expression: Verbal Verbal Expression Overall Verbal Expression: Appears within functional limits for tasks assessed (no notable aphasia although communication limited) Written Expression Dominant Hand: Right Written Expression: Not tested   Oral / Motor  Oral Motor/Sensory Function Overall Oral Motor/Sensory Function: Moderate impairment Facial ROM: Reduced right;Suspected CN VII (facial) dysfunction Facial Symmetry: Abnormal symmetry right;Suspected CN VII (  facial) dysfunction Facial Strength: Reduced  right;Suspected CN VII (facial) dysfunction Lingual ROM: Within Functional Limits Lingual Symmetry: Within Functional Limits Lingual Strength: Reduced Lingual Sensation: Within Functional Limits Velum:  (difficult to view) Mandible: Within Functional Limits Motor Speech Overall Motor Speech: Impaired Respiration: Impaired Level of Impairment: Word Resonance: Within functional limits Articulation: Impaired Level of Impairment: Word Intelligibility: Intelligibility reduced Word: 50-74% accurate Phrase: 50-74% accurate Motor Planning: Witnin functional limits Effective Techniques: Over-articulate   GO            Abriella Filkins MA, CCC-SLP          Mearl Olver Meryl 12/21/2019, 1:42 PM

## 2019-12-21 NOTE — Progress Notes (Signed)
STROKE TEAM PROGRESS NOTE   INTERVAL HISTORY His daughter is at the bedside.  Pt lethargic, initially eyes close but were able to open with repetitive stimulation. Severe dysarthria, not orientated to place, time or people, but able to tell me his age in severely dysarthric voice. Still has right facial droop and right hemiplegia. Left withdraw to pain. Pending MRI.   Per daughter, pt at home baseline largely bed bound, but able to stand up with walker. Not active at home due to chronic right leg weakness and right heel ulcer.   OBJECTIVE Vitals:   12/21/19 0400 12/21/19 0500 12/21/19 0600 12/21/19 0700  BP: 139/70 139/71 (!) 142/78 140/76  Pulse: 93 92 92 92  Resp: (!) 27 (!) 26 17 (!) 22  Temp: (!) 97.4 F (36.3 C)     TempSrc: Oral     SpO2: 99% 99% 99% 98%  Weight:        CBC:  Recent Labs  Lab 12/30/2019 1508 12/16/2019 1508 12/08/2019 1515 12/21/19 0312  WBC 6.0  --   --  6.5  NEUTROABS 3.4  --   --   --   HGB 10.8*   < > 10.9* 10.7*  HCT 33.4*   < > 32.0* 33.1*  MCV 87.4  --   --  84.9  PLT 329  --   --  319   < > = values in this interval not displayed.    Basic Metabolic Panel:  Recent Labs  Lab 12/16/2019 1508 12/06/2019 1508 12/08/2019 1515 12/21/19 0312  NA 135   < > 135 135  K 5.1   < > 5.0 5.0  CL 107   < > 110 108  CO2 16*  --   --  14*  GLUCOSE 205*   < > 201* 180*  BUN 40*   < > 36* 38*  CREATININE 2.32*   < > 2.10* 2.35*  CALCIUM 10.0  --   --  9.9   < > = values in this interval not displayed.    Lipid Panel:     Component Value Date/Time   CHOL 121 12/21/2019 0312   TRIG 141 12/21/2019 0312   HDL 37 (L) 12/21/2019 0312   CHOLHDL 3.3 12/21/2019 0312   VLDL 28 12/21/2019 0312   LDLCALC 56 12/21/2019 0312   HgbA1c:  Lab Results  Component Value Date   HGBA1C 7.5 (H) 12/21/2019   Urine Drug Screen:     Component Value Date/Time   LABOPIA NONE DETECTED 12/19/2019 2229   COCAINSCRNUR NONE DETECTED 12/19/2019 2229   LABBENZ NONE DETECTED  12/24/2019 2229   AMPHETMU NONE DETECTED 12/26/2019 2229   THCU NONE DETECTED 01/01/2020 2229   LABBARB NONE DETECTED 12/23/2019 2229    Alcohol Level     Component Value Date/Time   ETH <10 12/17/2019 1508    IMAGING  CT ANGIO HEAD CODE STROKE CT ANGIO NECK CODE STROKE CT CEREBRAL PERFUSION W CONTRAST 12/21/2019 IMPRESSION:   CTA neck:  1. The bilateral common and internal carotid arteries are patent within the neck without hemodynamically significant stenosis. Soft and calcified plaque within the bilateral carotid systems as described.  2. The vertebral arteries are patent within the neck bilaterally. Moderate atherosclerotic narrowing at the origin of the right vertebral artery.  3. Redemonstrated pulmonary opacity within the right upper lobe which is incompletely imaged on the current examination but measures at least 5.4 cm. This finding is nonspecific but malignancy remains a differential consideration. Follow-up  contrast-enhanced chest CT or PET-CT should be considered for further evaluation   CTA head:  1. No intracranial large vessel occlusion is identified.  2. High-grade stenosis within the P2 left posterior cerebral artery.  3. Calcified plaque within both intracranial internal carotid arteries. Moderate/severe stenosis of the paraclinoid right ICA. Moderate stenosis of the paraclinoid left ICA.   CT perfusion head:  The perfusion software identifies no core infarct. The perfusion software identifies no critically hypoperfused parenchyma utilizing the Tmax>6 seconds threshold.   CT HEAD CODE STROKE WO CONTRAST 12/25/2019 IMPRESSION:  No CT evidence of acute intracranial abnormality. ASPECTS is 10. Known small chronic infarcts within the brainstem and cerebellum, some of which were better appreciated on the prior MRI of 09/07/2016. Stable background mild cerebral white matter chronic small vessel ischemic disease and moderately advanced generalized cerebral atrophy.  Ethmoid and right maxillary sinusitis. Bilateral mastoid effusions.   EEG adult 12/21/2019 ABNORMALITY  Continuous slow, generalized  IMPRESSION:  This study is suggestive of mild to moderate diffuse encephalopathy, nonspecific etiology. No seizures or epileptiform discharges were seen throughout the recording.   Transthoracic Echocardiogram  1. Left ventricular ejection fraction, by estimation, is 60 to 65%. The  left ventricle has normal function. The left ventricle has no regional  wall motion abnormalities. There is moderate left ventricular hypertrophy.  Left ventricular diastolic  parameters are consistent with Grade I diastolic dysfunction (impaired  relaxation). Elevated left atrial pressure.  2. Right ventricular systolic function is normal. The right ventricular  size is normal. There is mildly elevated pulmonary artery systolic  pressure. The estimated right ventricular systolic pressure is 43.1 mmHg.  3. The mitral valve is degenerative. No evidence of mitral valve  regurgitation. No evidence of mitral stenosis. Severe mitral annular  calcification.  4. The aortic valve has been repaired/replaced. Aortic valve  regurgitation is not visualized. No aortic stenosis is present. There is a  St. Jude valve present in the aortic position. Echo findings are  consistent with normal structure and function of the  aortic valve prosthesis. Aortic valve mean gradient measures 9.3 mmHg.  Aortic valve Vmax measures 2.08 m/s.  5. The inferior vena cava is normal in size with greater than 50%  respiratory variability, suggesting right atrial pressure of 3 mmHg.  ECG - sinus or ectopic atrial rythym - rate 91 BPM. (See cardiology reading for complete details)  PHYSICAL EXAM  Temp:  [97 F (36.1 C)-99.7 F (37.6 C)] 97 F (36.1 C) (09/18 0800) Pulse Rate:  [78-94] 91 (09/18 1000) Resp:  [13-27] 15 (09/18 1000) BP: (118-149)/(59-90) 124/59 (09/18 1000) SpO2:  [95 %-100 %] 99 %  (09/18 1000) Weight:  [76.2 kg] 76.2 kg (09/17 1500)  General - Well nourished, well developed, drowsy and lethargic  Ophthalmologic - fundi not visualized due to noncooperation.  Cardiovascular - Regular rhythm and rate.  Neuro - drowsy and lethargic, able to open eyes with repetitive stimulation. Orientated to age but not to time place or people. Severe dysarthria. Able to have bilateral gaze on voice. Frequent spontaneous blinking, not able to test visual field. PERRL. Right facial droop. Tongue midline protrusion. RUE flaccid, RLE slight withdraw to pain not against gravity. LUE 4/5 and LLE 3-/5 on pain stimulation. No babinski bilaterally. Sensation, coordination and gait not tested.   ASSESSMENT/PLAN [name] is a 86 y.o. male with history of CVA, type 2 diabetes mellitus, HTN, HLD, hearing impaired (bilateral hearing aids), CAD (MI - CABG), BPH, ASPVD, AVR (St Jude -  Porcine) seizure disorder, right heel ulcer, and chronic kidney disease stage IV presenting with speech difficulties and right sided weakness. The patient received IV t-PA Friday 12/05/2019 at 1530.  Stroke: left BG and CR infarct as well as punctate right frontal WM infarct, cardioembolic source vs. Synchronized small vessel disease.  CT Head - No CT evidence of acute intracranial abnormality. ASPECTS is 10. Known small chronic infarcts within the brainstem and cerebellum..   CTA Head and neck - High-grade stenosis within the P2 left posterior cerebral artery. Moderate/severe stenosis of the paraclinoid right ICA. Moderate stenosis of the paraclinoid left ICA. No intracranial large vessel occlusion is identified.   CTP - neg.  MRI head - left BG and CR infarct as well as punctate right frontal WM infarct  EEG negative for seizrue, mild to moderate diffuse encephalopathy  2D Echo - EF 60-65%, st Jude AV  Consider 30 day cardiac event monitoring as outpt  [employer] Virus 2 - negative  LDL -  56  HgbA1c - 7.5  UDS - negative  VTE prophylaxis - SCDs  clopidogrel 75 mg daily prior to admission, now on ASA PR.   Patient will be counseled to be compliant with his antithrombotic medications  Ongoing aggressive stroke risk factor management  Therapy recommendations:  pending  Disposition:  Pending  Hx of stroke and seizure  11/2014, admitted for dizziness, found to have right cerebellum infarct, put on aspirin  09/2016, presented with seizure x2, put on Keppra, MRI negative.  Continue Keppra on discharge, no more seizure since  UTI  UA WBC > 50  Urine culture pending  On rocephin  Dysphagia   Did not pass swallow  Now NPO  On IVF  May need cortrak and TF  Hypertension  Home BP meds: Norvasc  Current BP meds: prn Labetalol  Stable  BP goal < 180/105 . Long-term BP goal normotensive  Hyperlipidemia  Home Lipid lowering medication: Mevacor 40 mg daily  LDL 56, goal < 70  Resume statin once po access  Continue statin at discharge  Diabetes  Home diabetic meds: metformin ; Glucotrol  Current diabetic meds: SSI   HgbA1c 7.5, goal < 7.0  CBG monitoring  Close PCP follow-up  Other Stroke Risk Factors  Advanced age  Former cigarette smoker - quit  Coronary artery disease  Other Active Problems  Code status - Full code  Seizure disorder - Keppra  Anemia due to CKD- Hgb - 10.8->10.9->10.7  CKD stage IV - creatinine -2.32->2.35  Pulmonary opacity within the right upper lobe which is incompletely imaged - measures at least 5.4 cm. - follow-up contrast-enhanced chest CT or PET-CT should be considered for further evaluation.  Right heel ulcer - on dressing   Hospital day # 1  This patient is critically ill due to left MCA stroke, status post TPA, history of stroke and seizure, CKD, dysphagia and at significant risk of neurological worsening, death form recurrent stroke, hemorrhagic conversion, hemorrhage, kidney failure,  aspiration pneumonia, status epilepticus. This patient's care requires constant monitoring of vital signs, hemodynamics, respiratory and cardiac monitoring, review of multiple databases, neurological assessment, discussion with family, other specialists and medical decision making of high complexity. I spent 40 minutes of neurocritical care time in the care of this patient. I had long discussion with daughter at bedside, updated pt current condition, treatment plan and potential prognosis, and answered all the questions.  She expressed understanding and appreciation.   Rosalin Hawking, MD PhD Stroke Neurology 12/21/2019 4:54  PM    To contact Stroke Continuity provider, please refer to [email or web address] After hours, contact General Neurology

## 2019-12-22 DIAGNOSIS — E1165 Type 2 diabetes mellitus with hyperglycemia: Secondary | ICD-10-CM

## 2019-12-22 LAB — BASIC METABOLIC PANEL
Anion gap: 15 (ref 5–15)
BUN: 36 mg/dL — ABNORMAL HIGH (ref 8–23)
CO2: 12 mmol/L — ABNORMAL LOW (ref 22–32)
Calcium: 9.9 mg/dL (ref 8.9–10.3)
Chloride: 112 mmol/L — ABNORMAL HIGH (ref 98–111)
Creatinine, Ser: 2.33 mg/dL — ABNORMAL HIGH (ref 0.61–1.24)
GFR calc Af Amer: 27 mL/min — ABNORMAL LOW (ref >60)
GFR calc non Af Amer: 24 mL/min — ABNORMAL LOW (ref >60)
Glucose, Bld: 142 mg/dL — ABNORMAL HIGH (ref 70–99)
Potassium: 5 mmol/L (ref 3.5–5.1)
Sodium: 139 mmol/L (ref 135–145)

## 2019-12-22 LAB — CBC
HCT: 33.2 % — ABNORMAL LOW (ref 39.0–52.0)
Hemoglobin: 10.8 g/dL — ABNORMAL LOW (ref 13.0–17.0)
MCH: 28.6 pg (ref 26.0–34.0)
MCHC: 32.5 g/dL (ref 30.0–36.0)
MCV: 87.8 fL (ref 80.0–100.0)
Platelets: 348 10*3/uL (ref 150–400)
RBC: 3.78 MIL/uL — ABNORMAL LOW (ref 4.22–5.81)
RDW: 14.5 % (ref 11.5–15.5)
WBC: 6 10*3/uL (ref 4.0–10.5)
nRBC: 0 % (ref 0.0–0.2)

## 2019-12-22 LAB — GLUCOSE, CAPILLARY
Glucose-Capillary: 118 mg/dL — ABNORMAL HIGH (ref 70–99)
Glucose-Capillary: 136 mg/dL — ABNORMAL HIGH (ref 70–99)
Glucose-Capillary: 150 mg/dL — ABNORMAL HIGH (ref 70–99)
Glucose-Capillary: 126 mg/dL — ABNORMAL HIGH (ref 70–99)
Glucose-Capillary: 141 mg/dL — ABNORMAL HIGH (ref 70–99)
Glucose-Capillary: 128 mg/dL — ABNORMAL HIGH (ref 70–99)

## 2019-12-22 NOTE — Evaluation (Signed)
Occupational Therapy Evaluation Patient Details Name: Matthew Bruce MRN: [id number] DOB: [birth year] Today's Date: 12/22/2019    History of Present Illness [name] is a 86 y.o. male with history of CVA, type 2 diabetes mellitus, HTN, HLD, hearing impaired (bilateral hearing aids), CAD (MI - CABG), BPH, ASPVD, AVR (St Jude - Porcine) seizure disorder, right heel ulcer, and chronic kidney disease stage IV presenting with speech difficulties and right sided weakness.  Recieved IV TPA.  MRI positive for left BG and CR infarct as well as punctate right frontal WM infarct   Clinical Impression   PTA, pt was living with his daughter and performed self feeding and grooming; required assistance for dressing/bathing and used lateral scoots for transfers to w/c. Daughter reports he was participating in [facility] therapy PTA. Pt currently requiring Max-Total A for ADLs and bed mobility. Pt presenting with R lateral lean, decreased functional use of RUE, lethargy, poor cognition, and decreased activity tolerance. Pt would benefit from further acute OT to facilitate safe dc. Recommend dc to SNF for further OT to optimize safety, independence with ADLs, and return to PLOF.     Follow Up Recommendations  SNF;Supervision/Assistance - 24 hour    Equipment Recommendations  Other (comment) (Defer to next venue)    Recommendations for Other Services PT consult     Precautions / Restrictions Precautions Precautions: Fall      Mobility Bed Mobility Overal bed mobility: Needs Assistance Bed Mobility: Rolling Rolling: Max assist;+2 for physical assistance Sidelying to sit: Max assist;+2 for physical assistance   Sit to supine: Max assist;+2 for physical assistance   General bed mobility comments: Max A +2 for bed mobility; manage BLEs, bring hips to EOB with bed pad, and elevate trunk  Transfers                 General transfer comment: Defered for safety    Balance Overall balance  assessment: Needs assistance Sitting-balance support: No upper extremity supported;Feet supported Sitting balance-Leahy Scale: Poor Sitting balance - Comments: Right lateral lean with Min A                                    ADL either performed or assessed with clinical judgement   ADL Overall ADL's : Needs assistance/impaired     Grooming: Oral care;Maximal assistance;Wash/dry face;Minimal assistance;Bed level Grooming Details (indicate cue type and reason): Max A for oral care. Min A for support at elbow to bring hand to face due to fatigue.  Upper Body Bathing: Maximal assistance;Bed level   Lower Body Bathing: Total assistance;Bed level   Upper Body Dressing : Maximal assistance;Bed level   Lower Body Dressing: Total assistance;Bed level       Toileting- Clothing Manipulation and Hygiene: Total assistance;Bed level Toileting - Clothing Manipulation Details (indicate cue type and reason): Total A for peri care after bowel incontience       General ADL Comments: Pt with decreased balance, strength, cognition, ROM, and functional use of UEs     Vision Baseline Vision/History: Wears glasses Wears Glasses: At all times Vision Assessment?: Vision impaired- to be further tested in functional context Additional Comments: Noting head turn and gaze to right. No actively gazeing to L. Grimacing with PROM head turn to L     Perception     Praxis      Pertinent Vitals/Pain Pain Assessment: No/denies pain  Hand Dominance Right   Extremity/Trunk Assessment Upper Extremity Assessment Upper Extremity Assessment: RUE deficits/detail;LUE deficits/detail RUE Deficits / Details: WFL for PROM. withdraw from pain. no active movement RUE Coordination: decreased fine motor;decreased gross motor LUE Deficits / Details: Generalized weakness. Stiffness with movement. Able to bring wash clothe to face; fatigues quickly   Lower Extremity Assessment Lower Extremity  Assessment: Defer to PT evaluation   Cervical / Trunk Assessment Cervical / Trunk Assessment: Other exceptions Cervical / Trunk Exceptions: Right lateral lean. Head turn to R; grimacing with neck turn to left   Communication Communication Communication: HOH;Expressive difficulties   Cognition Arousal/Alertness: Lethargic Behavior During Therapy: Flat affect Overall Cognitive Status: Impaired/Different from baseline Area of Impairment: Following commands;Attention;Problem solving                   Current Attention Level: Focused;Sustained   Following Commands: Follows one step commands inconsistently     Problem Solving: Slow processing;Difficulty sequencing General Comments: Pt responding to majority of questions with "no". Pt engaging in washing his face; sustained attention for ~15 secs. Fatigues quickly   General Comments  Daughter present throughout session    Exercises     Shoulder Instructions      Home Living Family/patient expects to be discharged to:: Private residence Living Arrangements: Children Available Help at Discharge: Family Type of Home: House       Home Layout: One [place]: Environmental consultant - 2 wheels;Wheelchair - manual;Shower seat;Grab bars - tub/shower;Hospital bed   Additional Comments: Daughter providing information      Prior Functioning/Environment Level of Independence: Needs assistance  Gait / Transfers Assistance Needed: Lateral scoot to w/c with daughter's help ADL's / Homemaking Assistance Needed: Pt self feeding. Daughter assist with ADLs.    Comments: HH OT once a week; RN once a week. PT recently signed off.         OT Problem List: Decreased strength;Decreased range of motion;Decreased activity tolerance;Impaired balance (sitting and/or standing);Decreased coordination;Decreased cognition;Decreased safety awareness;Decreased knowledge of use of DME or AE;Decreased knowledge of  precautions;Impaired UE functional use;Pain      OT Treatment/Interventions: Self-care/ADL training;Therapeutic exercise;Energy conservation;DME and/or AE instruction;Therapeutic activities;Patient/family education    OT Goals(Current goals can be found in the care plan section) Acute Rehab OT Goals Patient Stated Goal: none stated OT Goal Formulation: With patient Time For Goal Achievement: 01/05/20 Potential to Achieve Goals: Good  OT Frequency: Min 2X/week   Barriers to D/C:            Co-evaluation              AM-PAC OT "6 Clicks" Daily Activity     Outcome Measure Help from another person eating meals?: A Lot Help from another person taking care of personal grooming?: A Lot Help from another person toileting, which includes using toliet, bedpan, or urinal?: Total Help from another person bathing (including washing, rinsing, drying)?: Total Help from another person to put on and taking off regular upper body clothing?: A Lot Help from another person to put on and taking off regular lower body clothing?: Total 6 Click Score: 9   End of Session Nurse Communication: Mobility status  Activity Tolerance: Patient limited by lethargy Patient left: in bed;with call bell/phone within reach;with bed alarm set;with nursing/sitter in room  OT Visit Diagnosis: Unsteadiness on feet (R26.81);Other abnormalities of gait and mobility (R26.89);Muscle weakness (generalized) (M62.81);Pain;Hemiplegia and hemiparesis Hemiplegia -  Right/Left: Right Hemiplegia - dominant/non-dominant: Dominant Hemiplegia - caused by: Cerebral infarction Pain - part of body:  (Generalized)                Time: 9211-9417 OT Time Calculation (min): 33 min Charges:  OT General Charges $OT Visit: 1 Visit OT Evaluation $OT Eval Moderate Complexity: 1 Mod OT Treatments $Self Care/Home Management : 8-22 mins  Garison Genova MSOT, OTR/L Acute Rehab Pager: [phone] Office: [place] 12/22/2019, 4:39 PM

## 2019-12-22 NOTE — Progress Notes (Signed)
STROKE TEAM PROGRESS NOTE   INTERVAL HISTORY Daughter at bedside. Pt lying in bed, lethargic, eyes open. Has hearing aid today. Able to tell me his age but not orientated to place and people. Not able to tell time. Still has right UE plegic. MRI showed left BG CR infarct. Did not pass swallow yesterday. On IVF.   OBJECTIVE Vitals:   12/21/19 1958 12/21/19 2100 12/22/19 0046 12/22/19 0351  BP: (!) 143/71  (!) 151/77 (!) 144/76  Pulse: 95  94 95  Resp: 16  17 18   Temp: 99.5 F (37.5 C) 99.1 F (37.3 C) 97.9 F (36.6 C) 97.7 F (36.5 C)  TempSrc: Oral Oral Oral Oral  SpO2: 100%  100% 99%  Weight:        CBC:  Recent Labs  Lab 12/10/2019 1508 12/10/2019 1508 12/05/2019 1515 12/21/19 0312  WBC 6.0  --   --  6.5  NEUTROABS 3.4  --   --   --   HGB 10.8*   < > 10.9* 10.7*  HCT 33.4*   < > 32.0* 33.1*  MCV 87.4  --   --  84.9  PLT 329  --   --  319   < > = values in this interval not displayed.    Basic Metabolic Panel:  Recent Labs  Lab 12/22/2019 1508 12/06/2019 1508 12/12/2019 1515 12/21/19 0312  NA 135   < > 135 135  K 5.1   < > 5.0 5.0  CL 107   < > 110 108  CO2 16*  --   --  14*  GLUCOSE 205*   < > 201* 180*  BUN 40*   < > 36* 38*  CREATININE 2.32*   < > 2.10* 2.35*  CALCIUM 10.0  --   --  9.9   < > = values in this interval not displayed.    Lipid Panel:     Component Value Date/Time   CHOL 121 12/21/2019 0312   TRIG 141 12/21/2019 0312   HDL 37 (L) 12/21/2019 0312   CHOLHDL 3.3 12/21/2019 0312   VLDL 28 12/21/2019 0312   LDLCALC 56 12/21/2019 0312   HgbA1c:  Lab Results  Component Value Date   HGBA1C 7.5 (H) 12/21/2019   Urine Drug Screen:     Component Value Date/Time   LABOPIA NONE DETECTED 12/09/2019 2229   COCAINSCRNUR NONE DETECTED 12/17/2019 2229   LABBENZ NONE DETECTED 01/01/2020 2229   AMPHETMU NONE DETECTED 12/26/2019 2229   THCU NONE DETECTED 12/19/2019 2229   LABBARB NONE DETECTED 12/22/2019 2229    Alcohol Level     Component Value  Date/Time   ETH <10 12/27/2019 1508    IMAGING  CT ANGIO HEAD CODE STROKE CT ANGIO NECK CODE STROKE CT CEREBRAL PERFUSION W CONTRAST 12/16/2019 IMPRESSION:   CTA neck:  1. The bilateral common and internal carotid arteries are patent within the neck without hemodynamically significant stenosis. Soft and calcified plaque within the bilateral carotid systems as described.  2. The vertebral arteries are patent within the neck bilaterally. Moderate atherosclerotic narrowing at the origin of the right vertebral artery.  3. Redemonstrated pulmonary opacity within the right upper lobe which is incompletely imaged on the current examination but measures at least 5.4 cm. This finding is nonspecific but malignancy remains a differential consideration. Follow-up contrast-enhanced chest CT or PET-CT should be considered for further evaluation   CTA head:  1. No intracranial large vessel occlusion is identified.  2. High-grade stenosis within  the P2 left posterior cerebral artery.  3. Calcified plaque within both intracranial internal carotid arteries. Moderate/severe stenosis of the paraclinoid right ICA. Moderate stenosis of the paraclinoid left ICA.   CT perfusion head:  The perfusion software identifies no core infarct. The perfusion software identifies no critically hypoperfused parenchyma utilizing the Tmax>6 seconds threshold.   CT HEAD CODE STROKE WO CONTRAST 12/22/2019 IMPRESSION:  No CT evidence of acute intracranial abnormality. ASPECTS is 10. Known small chronic infarcts within the brainstem and cerebellum, some of which were better appreciated on the prior MRI of 09/07/2016. Stable background mild cerebral white matter chronic small vessel ischemic disease and moderately advanced generalized cerebral atrophy. Ethmoid and right maxillary sinusitis. Bilateral mastoid effusions.   MRI Brain WO Contrast 12/21/19 IMPRESSION: 1. Acute small vessel infarct tracking from the posterior  left corona radiata to the posterior left lentiform. No associated hemorrhage or mass effect.  2. Possible additional punctate acute lacunar infarct in the anterior right frontal lobe white matter, such as due to synchronous acute on chronic small vessel disease.   EEG adult 12/11/2019 ABNORMALITY  Continuous slow, generalized  IMPRESSION:  This study is suggestive of mild to moderate diffuse encephalopathy, nonspecific etiology. No seizures or epileptiform discharges were seen throughout the recording.   Transthoracic Echocardiogram  1. Left ventricular ejection fraction, by estimation, is 60 to 65%. The  left ventricle has normal function. The left ventricle has no regional  wall motion abnormalities. There is moderate left ventricular hypertrophy.  Left ventricular diastolic  parameters are consistent with Grade I diastolic dysfunction (impaired  relaxation). Elevated left atrial pressure.  2. Right ventricular systolic function is normal. The right ventricular  size is normal. There is mildly elevated pulmonary artery systolic  pressure. The estimated right ventricular systolic pressure is 52.8 mmHg.  3. The mitral valve is degenerative. No evidence of mitral valve  regurgitation. No evidence of mitral stenosis. Severe mitral annular  calcification.  4. The aortic valve has been repaired/replaced. Aortic valve  regurgitation is not visualized. No aortic stenosis is present. There is a  St. Jude valve present in the aortic position. Echo findings are  consistent with normal structure and function of the  aortic valve prosthesis. Aortic valve mean gradient measures 9.3 mmHg.  Aortic valve Vmax measures 2.08 m/s.  5. The inferior vena cava is normal in size with greater than 50%  respiratory variability, suggesting right atrial pressure of 3 mmHg.  ECG - sinus or ectopic atrial rythym - rate 91 BPM. (See cardiology reading for complete details)  PHYSICAL EXAM  Temp:   [96.9 F (36.1 C)-99.9 F (37.7 C)] 97.7 F (36.5 C) (09/19 0351) Pulse Rate:  [78-95] 95 (09/19 0351) Resp:  [14-22] 18 (09/19 0351) BP: (119-151)/(59-78) 144/76 (09/19 0351) SpO2:  [97 %-100 %] 99 % (09/19 0351)  General - Well nourished, well developed, lethargic  Ophthalmologic - fundi not visualized due to noncooperation.  Cardiovascular - Regular rhythm and rate.  Neuro - lethargic, eyes open today. Orientated to age but not to time place or people or time. Severe dysarthria. Able to have bilateral gaze on voice. Frequent spontaneous blinking, not able to test visual field. PERRL. Right facial droop. Tongue midline protrusion. RUE flaccid, RLE slight withdraw to pain not against gravity. LUE 4/5 and LLE 3-/5 on pain stimulation. No babinski bilaterally. Sensation, coordination and gait not tested.   ASSESSMENT/PLAN [name] is a 86 y.o. male with history of CVA, type 2 diabetes mellitus, HTN,  HLD, hearing impaired (bilateral hearing aids), CAD (MI - CABG), BPH, ASPVD, AVR (St Jude - Porcine) seizure disorder, right heel ulcer, and chronic kidney disease stage IV presenting with speech difficulties and right sided weakness. The patient received IV t-PA Friday 12/25/2019 at 1530.  Stroke: left BG and CR infarct as well as punctate right frontal WM infarct s/p tPA, cardioembolic source vs. Synchronized small vessel disease.  CT Head - No CT evidence of acute intracranial abnormality. ASPECTS is 10. Known small chronic infarcts within the brainstem and cerebellum..   CTA Head and neck - High-grade stenosis within the P2 left posterior cerebral artery. Moderate/severe stenosis of the paraclinoid right ICA. Moderate stenosis of the paraclinoid left ICA. No intracranial large vessel occlusion is identified.   CTP - neg.  MRI head - left BG and CR infarct as well as punctate right frontal WM infarct  EEG negative for seizrue, mild to moderate diffuse encephalopathy  2D Echo  - EF 60-65%, st Jude AV  Consider 30 day cardiac event monitoring as outpt  [employer] Virus 2 - negative  LDL - 56  HgbA1c - 7.5  UDS - negative  VTE prophylaxis - SCDs  clopidogrel 75 mg daily prior to admission, now on ASA PR with po access.   Patient will be counseled to be compliant with his antithrombotic medications  Ongoing aggressive stroke risk factor management  Therapy recommendations:  HH Therapy vs SNF - daughter leaning SNF  Disposition:  Pending  Hx of stroke and seizure  11/2014, admitted for dizziness, found to have right cerebellum infarct, put on aspirin  09/2016, presented with seizure x2, put on Keppra, MRI negative.  Continue Keppra on discharge, no more seizure since  UTI  UA WBC > 50  Urine culture - Klebsiella - sensitivities pending  On Rocephin - started 9/18  Dysphagia   Did not pass swallow  Now NPO  On IVF  Speech following  May need cortrak and TF  Hypertension  Home BP meds: Norvasc  Current BP meds: prn Labetalol  Stable  BP goal < 180/105 . Long-term BP goal normotensive  Hyperlipidemia  Home Lipid lowering medication: Mevacor 40 mg daily  LDL 56, goal < 70  Resume statin once po access  Continue statin at discharge  Diabetes  Home diabetic meds: metformin ; Glucotrol  Current diabetic meds: SSI   HgbA1c 7.5, goal < 7.0  CBG monitoring  Close PCP follow-up  Other Stroke Risk Factors  Advanced age  Former cigarette smoker - quit  Coronary artery disease  Other Active Problems  Code status - Full code  Seizure disorder - Keppra  Anemia due to CKD- Hgb - 10.8->10.9->10.7  CKD stage IV - creatinine -2.32->2.35  Pulmonary opacity within the right upper lobe which is incompletely imaged - measures at least 5.4 cm. - follow-up contrast-enhanced chest CT or PET-CT should be considered for further evaluation.  Right heel ulcer - on dressing (bed bound at home largelyb ut able to stand up  with walker)   Hospital day # 2   Rosalin Hawking, MD PhD Stroke Neurology 12/22/2019 3:56 PM  To contact Stroke Continuity provider, please refer to [email or web address] After hours, contact General Neurology

## 2019-12-22 NOTE — Plan of Care (Signed)
°  Problem: Education: Goal: Knowledge of disease or condition will improve Outcome: Progressing Goal: Knowledge of secondary prevention will improve Outcome: Progressing Goal: Knowledge of patient specific risk factors addressed and post discharge goals established will improve Outcome: Progressing Goal: Individualized Educational Video(s) Outcome: Progressing   Problem: Coping: Goal: Will verbalize positive feelings about self Outcome: Progressing Goal: Will identify appropriate support needs Outcome: Progressing   Problem: Health Behavior/Discharge Planning: Goal: Ability to manage health-related needs will improve Outcome: Progressing   Problem: Self-Care: Goal: Ability to participate in self-care as condition permits will improve Outcome: Progressing Goal: Verbalization of feelings and concerns over difficulty with self-care will improve Outcome: Progressing Goal: Ability to communicate needs accurately will improve Outcome: Progressing   Problem: Nutrition: Goal: Risk of aspiration will decrease Outcome: Progressing Goal: Dietary intake will improve Outcome: Progressing   Problem: Intracerebral Hemorrhage Tissue Perfusion: Goal: Complications of Intracerebral Hemorrhage will be minimized Outcome: Progressing   Problem: Ischemic Stroke/TIA Tissue Perfusion: Goal: Complications of ischemic stroke/TIA will be minimized Outcome: Progressing

## 2019-12-22 NOTE — Progress Notes (Signed)
  Speech Language Pathology Treatment: Dysphagia  Patient Details Name: Matthew Bruce MRN: [id number] DOB: March 01, 1927 Today's Date: 12/22/2019 Time: 3662-9476 SLP Time Calculation (min) (ACUTE ONLY): 16 min  Assessment / Plan / Recommendation Clinical Impression  Pt seen for f/u diagnostic swallowing treatment with focus on readiness for instrumental exam to evaluate swallowing physiology. Patient lethargic but cooperative, able to maintain alert state for po trials. Able to consume ice chips and pureed solids without overt indication of aspiration today and improved oral control of bolus with no anterior labial spillage and efficient oral transit of bolus. Thin liquid trials elicited an immediate strong cough response indicative of decreased airway protection. Patient ready for instrumental testing. Radiology unable to complete MBS this date. Will plan for MBS on 9/20.    HPI HPI: MARKIS LANGLAND is an 86 y.o. male with medical history significant for history of CVA, type 2 diabetes mellitus, BPH, seizure disorder, right heel ulcer, and chronic kidney disease stage IV who was brought in by [facility] as a code stroke with right sided weakness and aphasia. Head CT negative for acute CVA, MRI pending.       SLP Plan  MBS (next date)       Recommendations  Diet recommendations: NPO Medication Administration: Via alternative means                Oral Care Recommendations: Oral care QID Follow up Recommendations: Skilled Nursing facility SLP Visit Diagnosis: Dysphagia, oropharyngeal phase (R13.12) Plan: MBS (next date)       [place] MA, CCC-SLP     Bereket Gernert Meryl 12/22/2019, 9:02 AM

## 2019-12-23 ENCOUNTER — Inpatient Hospital Stay (HOSPITAL_COMMUNITY): Payer: Medicare HMO

## 2019-12-23 DIAGNOSIS — I639 Cerebral infarction, unspecified: Principal | ICD-10-CM

## 2019-12-23 DIAGNOSIS — E43 Unspecified severe protein-calorie malnutrition: Secondary | ICD-10-CM

## 2019-12-23 LAB — GLUCOSE, CAPILLARY
Glucose-Capillary: 77 mg/dL (ref 70–99)
Glucose-Capillary: 84 mg/dL (ref 70–99)
Glucose-Capillary: 113 mg/dL — ABNORMAL HIGH (ref 70–99)
Glucose-Capillary: 121 mg/dL — ABNORMAL HIGH (ref 70–99)
Glucose-Capillary: 124 mg/dL — ABNORMAL HIGH (ref 70–99)
Glucose-Capillary: 144 mg/dL — ABNORMAL HIGH (ref 70–99)

## 2019-12-23 LAB — BASIC METABOLIC PANEL
Anion gap: 14 (ref 5–15)
BUN: 36 mg/dL — ABNORMAL HIGH (ref 8–23)
CO2: 15 mmol/L — ABNORMAL LOW (ref 22–32)
Calcium: 9.9 mg/dL (ref 8.9–10.3)
Chloride: 116 mmol/L — ABNORMAL HIGH (ref 98–111)
Creatinine, Ser: 2.29 mg/dL — ABNORMAL HIGH (ref 0.61–1.24)
GFR calc Af Amer: 28 mL/min — ABNORMAL LOW (ref >60)
GFR calc non Af Amer: 24 mL/min — ABNORMAL LOW (ref >60)
Glucose, Bld: 91 mg/dL (ref 70–99)
Potassium: 4.3 mmol/L (ref 3.5–5.1)
Sodium: 145 mmol/L (ref 135–145)

## 2019-12-23 LAB — CBC
HCT: 30.9 % — ABNORMAL LOW (ref 39.0–52.0)
Hemoglobin: 9.9 g/dL — ABNORMAL LOW (ref 13.0–17.0)
MCH: 28.2 pg (ref 26.0–34.0)
MCHC: 32 g/dL (ref 30.0–36.0)
MCV: 88 fL (ref 80.0–100.0)
Platelets: 350 10*3/uL (ref 150–400)
RBC: 3.51 MIL/uL — ABNORMAL LOW (ref 4.22–5.81)
RDW: 14.5 % (ref 11.5–15.5)
WBC: 5.5 10*3/uL (ref 4.0–10.5)
nRBC: 0 % (ref 0.0–0.2)

## 2019-12-23 LAB — URINE CULTURE: Culture: >=100000 — AB

## 2019-12-23 MED ORDER — PRAVASTATIN SODIUM 40 MG PO TABS
40.0000 mg | ORAL_TABLET | Freq: Every day | ORAL | Status: DC
  Administered 2019-12-23 – 2019-12-25 (×3): 40 mg
  Filled 2019-12-23 (×3): qty 1

## 2019-12-23 MED ORDER — ASPIRIN 325 MG PO TABS
325.0000 mg | ORAL_TABLET | Freq: Every day | ORAL | Status: DC
  Administered 2019-12-24 – 2019-12-25 (×2): 325 mg via ORAL
  Filled 2019-12-23 (×2): qty 1

## 2019-12-23 MED ORDER — CEFAZOLIN SODIUM-DEXTROSE 1-4 GM/50ML-% IV SOLN
1.0000 g | Freq: Two times a day (BID) | INTRAVENOUS | Status: AC
  Administered 2019-12-23 – 2019-12-25 (×5): 1 g via INTRAVENOUS
  Filled 2019-12-23 (×7): qty 50

## 2019-12-23 MED ORDER — SODIUM CHLORIDE 0.9 % IV SOLN: INTRAVENOUS | Status: DC

## 2019-12-23 MED ORDER — FREE WATER
170.0000 mL | Status: DC
  Administered 2019-12-23 – 2019-12-26 (×18): 170 mL

## 2019-12-23 MED ORDER — JUVEN PO PACK
1.0000 | PACK | Freq: Two times a day (BID) | ORAL | Status: DC
  Administered 2019-12-24 – 2019-12-25 (×3): 1 via ORAL
  Filled 2019-12-23 (×4): qty 1

## 2019-12-23 MED ORDER — OSMOLITE 1.5 CAL PO LIQD
1000.0000 mL | ORAL | Status: DC
  Administered 2019-12-23 – 2019-12-26 (×6): 1000 mL
  Filled 2019-12-23 (×3): qty 1000

## 2019-12-23 MED ORDER — PROSOURCE TF PO LIQD
45.0000 mL | Freq: Three times a day (TID) | ORAL | Status: DC
  Administered 2019-12-23 – 2019-12-26 (×9): 45 mL
  Filled 2019-12-23 (×9): qty 45

## 2019-12-23 MED ORDER — COLLAGENASE 250 UNIT/GM EX OINT
TOPICAL_OINTMENT | Freq: Every day | CUTANEOUS | Status: DC
  Filled 2019-12-23: qty 30

## 2019-12-23 MED ORDER — OSMOLITE 1.2 CAL PO LIQD
1000.0000 mL | ORAL | Status: DC
  Filled 2019-12-23: qty 1000

## 2019-12-23 NOTE — Progress Notes (Signed)
Physical Therapy Treatment Patient Details Name: Matthew Bruce MRN: [id number] DOB: 02-Sep-1926 Today's Date: 12/23/2019    History of Present Illness Matthew Bruce is a 86 y.o. male with history of CVA, type 2 diabetes mellitus, HTN, HLD, hearing impaired (bilateral hearing aids), CAD (MI - CABG), BPH, ASPVD, AVR (St Jude - Porcine) seizure disorder, right heel ulcer, and chronic kidney disease stage IV presenting with speech difficulties and right sided weakness.  Recieved IV TPA.  MRI positive for left BG and CR infarct as well as punctate right frontal WM infarct    PT Comments    Pt sleeping upon entry and slow to waken. Session focused on therapeutic exercise of upper and lower extremities, positioning, and off-weighting sacrum. Pt with minimal active movement of RLE, and no active movement of RUE, however was able to participate more with L side exercise. Pt would benefit from an air mattress and discussed with RN. Daughter present throughout session. Will continue to follow.    Follow Up Recommendations  Supervision/Assistance - 24 hour;Home health PT;SNF (depending on family preference/assist available)     Equipment Recommendations  Other (comment);Hospital bed (may need hospital bed/hoyer lift, no family to determine equipment needs)    Recommendations for Other Services       Precautions / Restrictions Precautions Precautions: Fall Precaution Comments: skin breakdown on heel and sacrum  Restrictions Weight Bearing Restrictions: No    Mobility  Bed Mobility               General bed mobility comments: Not assessed.   Transfers                 General transfer comment: Not assessed.   Ambulation/Gait             General Gait Details: Not assessed.    Stairs             Wheelchair Mobility    Modified Rankin (Stroke Patients Only) Modified Rankin (Stroke Patients Only) Pre-Morbid Rankin Score: Severe disability Modified  Rankin: Severe disability     Balance       Sitting balance - Comments: Not assessed.        Standing balance comment: Not assessed.                             Cognition Arousal/Alertness: Lethargic Behavior During Therapy: Flat affect Overall Cognitive Status: Impaired/Different from baseline Area of Impairment: Following commands;Attention;Problem solving                   Current Attention Level: Focused;Sustained   Following Commands: Follows one step commands inconsistently     Problem Solving: Slow processing;Difficulty sequencing General Comments: Pt occasionally responding to questions but only with 1 word answers.       Exercises General Exercises - Upper Extremity Shoulder Flexion: 10 reps;AAROM;Both Elbow Flexion: 10 reps;AAROM;Both Elbow Extension: 10 reps;AAROM;Both Wrist Flexion: 10 reps;AAROM;Both Wrist Extension: 10 reps;AAROM;Both General Exercises - Lower Extremity Ankle Circles/Pumps: AAROM;10 reps;Both Short Arc Quad: 10 reps;AAROM;Both Heel Slides: 10 reps;AAROM;Both Hip ABduction/ADduction: 10 reps;AAROM;Both    General Comments General comments (skin integrity, edema, etc.): Positioned in bed to off-weight sacrum and support RUE in bed.       Pertinent Vitals/Pain Pain Assessment: Faces Faces Pain Scale: No hurt    Home Living  Prior Function            PT Goals (current goals can now be found in the care plan section) Acute Rehab PT Goals Patient Stated Goal: none stated PT Goal Formulation: Patient unable to participate in goal setting Time For Goal Achievement: 01/04/20 Potential to Achieve Goals: Fair Progress towards PT goals: Progressing toward goals    Frequency    Min 3X/week      PT Plan Current plan remains appropriate    Co-evaluation              AM-PAC PT "6 Clicks" Mobility   Outcome Measure  Help needed turning from your back to your side while in  a flat bed without using bedrails?: Total Help needed moving from lying on your back to sitting on the side of a flat bed without using bedrails?: Total Help needed moving to and from a bed to a chair (including a wheelchair)?: Total Help needed standing up from a chair using your arms (e.g., wheelchair or bedside chair)?: Total Help needed to walk in hospital room?: Total Help needed climbing 3-5 steps with a railing? : Total 6 Click Score: 6    End of Session   Activity Tolerance: Patient limited by fatigue Patient left: in bed;with call bell/phone within reach;with bed alarm set Nurse Communication: Mobility status PT Visit Diagnosis: Other abnormalities of gait and mobility (R26.89);Other symptoms and signs involving the nervous system (R29.898);Hemiplegia and hemiparesis Hemiplegia - Right/Left: Right Hemiplegia - dominant/non-dominant: Dominant Hemiplegia - caused by: Cerebral infarction     Time: 1340-1406 PT Time Calculation (min) (ACUTE ONLY): 26 min  Charges:  $Therapeutic Exercise: 23-37 mins                     Rolinda Roan, PT, DPT Acute Rehabilitation Services Pager: [phone] Office: [phone]    Thelma Comp 12/23/2019, 3:50 PM

## 2019-12-23 NOTE — Progress Notes (Signed)
Modified Barium Swallow Progress Note  Patient Details  Name: Matthew Bruce MRN: [id number] Date of Birth: [birth year]  Today's Date: 12/23/2019  Modified Barium Swallow completed.  Full report located under Chart Review in the Imaging Section.  Brief recommendations include the following:  Clinical Impression  Pt was seen for a modified barium swallow study and he presents with moderately severe oropharyngeal dysphagia c/b trace-moderate silent aspiration of all liquid consistencies and puree on this date.    Pt was initially trialed with teaspoons of liquid; however, this resulted in complete anterior loss on the R side.  Pt was unable to follow commands to hold and drink from the cup independently, therefore assisted straw sip was utilized for all liquid trials as the most functional form of feeding.  Pt exhibited reduced lingual control c/b premature spillage to the pyriform sinus with all liquid trials and to the valleculae with puree trials.  Lingual strength was also notably reduced evidenced by moderate oral residue with all trials which was suctioned from the pt's oral cavity in between trials.  Pharyngeal phase was remarkable for reduced hyolaryngeal elevation/excursion, reduced BOT retraction, reduced epiglottic inversion, and reduced pharyngeal constriction resulting in moderate-severe vallecular, pyriform, and posterior pharyngeal wall residue.  Swallow initiation was delayed at the level of the pyriform sinuses with all liquid trials which along with prolonged laryngeal closure resulted in laryngeal penetration and aspiration before, during, and after the swallow.  Aspiration of thin liquid was the most severe.  Aspiration of nectar-thick liquid, honey-thick liquid, and puree was trace-mild and was additionally impacted by the pt's inability to clear pharyngeal residue, resulting in aspiration after the swallow.    Pt is at an increased risk for aspiration with all PO consumption at  this time.  Recommend that he remain NPO with consideration of alternative means of nutrition vs consideration of a more liberalized diet with known risk of aspiration if it aligns with pt/family wishes.  Spoke with pt regarding the results of this evaluation; however, unable to determine how much he comprehended secondary to suspected aphasia.  A palliative care consult may be beneficial to discuss the pt's POC further.     Swallow Evaluation Recommendations   SLP Diet Recommendations: NPO   Medication Administration: Via alternative means     Oral Care Recommendations: Oral care QID;Staff/trained caregiver to provide oral care  [name] M.S., Agency Acute Rehabilitation Services Office: [phone]  [name] 12/23/2019,9:59 AM

## 2019-12-23 NOTE — Progress Notes (Addendum)
Initial Nutrition Assessment  DOCUMENTATION CODES:   Severe malnutrition in context of chronic illness  INTERVENTION:   -Initiate Osmolite 1.5 @ 25 ml/hr via cortrak tube and increase by 10 ml every 8 hours to goal rate of 55 ml/hr.   45 ml Prosource TF TID.    170 ml free water flush every 4 hours  Tube feeding regimen provides 2100 kcal (95% of needs), 116 grams of protein, and 1006 ml of H2O. Total free water: 2026  -1 packet Juven BID via cortrak tube, each packet provides 95 calories, 2.5 grams of protein (collagen), and 9.8 grams of carbohydrate (3 grams sugar); also contains 7 grams of L-arginine and L-glutamine, 300 mg vitamin C, 15 mg vitamin E, 1.2 mcg vitamin B-12, 9.5 mg zinc, 200 mg calcium, and 1.5 g  Calcium Beta-hydroxy-Beta-methylbutyrate to support wound healing  -Monitor Mg, K, and Phos daily and replete as necessary to due to high refeeding risk  NUTRITION DIAGNOSIS:   Severe Malnutrition related to chronic illness (CVA) as evidenced by estimated needs.  GOAL:   Patient will meet greater than or equal to 90% of their needs  MONITOR:   Diet advancement, Labs, Weight trends, TF tolerance, Skin, I & O's  REASON FOR ASSESSMENT:   Consult Enteral/tube feeding initiation and management  ASSESSMENT:   Matthew Bruce is an 86 y.o. male with medical history significant for history of CVA, type 2 diabetes mellitus, BPH, seizure disorder, right heel ulcer, and chronic kidney disease stage IV who was [facility] as a code stroke.  Pt admitted with aphasia and rt hemiplegia from lt hemispheric infarct.   9/18- s/p BSE- recommend NPO 9/20- s/p MBSS- recommend NPO, cortrak tube placed- tip of tube confirmed in the gastric region  Reviewed I/O's: -500 ml x 24 hours and +1.3 L since admission  UOP: 500 ml x 24 hours  Pt lying in bed at time of visit; cortrak tube just placed. No family present in the room, however, speaking to cortrak RD pt  family reports he has been unable to eat for the past 3 days.   Plan to start TF today via cortrak.   Reviewed wt hx; pt has experienced a 2.3% wt loss over the past 3 months, which is not significant for time frame.   Case discussed with Dr. Erlinda Hong regarding refeeding risk; received permission to order labs.  Per TOC team notes, pt may require SNF placement at discharge.   Lab Results  Component Value Date   HGBA1C 7.5 (H) 12/21/2019   PTA DM medications are 1000 mg metformin BID and 5 mg glucotrol daily. Per ADA's Standards of Medical Care of Diabetes, glycemic targets for older adults who have multiple co-morbidities, cognitive impairments, and functional dependence should be less stringent (Hgb A1c <8.0-8.5).   Labs reviewed: CBGS: 77-128 (inpatient orders for glycemic control are 0-15 units insulin aspart every 4 hours).   NUTRITION - FOCUSED PHYSICAL EXAM:    Most Recent Value  Orbital Region Severe depletion  Upper Arm Region Moderate depletion  Thoracic and Lumbar Region Moderate depletion  Buccal Region Moderate depletion  Temple Region Severe depletion  Clavicle Bone Region Moderate depletion  Clavicle and Acromion Bone Region Moderate depletion  Scapular Bone Region Moderate depletion  Dorsal Hand Severe depletion  Patellar Region Severe depletion  Anterior Thigh Region Severe depletion  Posterior Calf Region Severe depletion  Edema (RD Assessment) None  Hair Reviewed  Eyes Reviewed  Mouth Reviewed  Skin Reviewed  Nails Reviewed       Diet Order:   Diet Order            Diet NPO time specified  Diet effective now                 EDUCATION NEEDS:   Not appropriate for education at this time  Skin:  Skin Assessment: Skin Integrity Issues: Skin Integrity Issues:: Stage II, Unstageable Stage II: coccyx Unstageable: rt heel  Last BM:  12/23/19  Height:   Ht Readings from Last 1 Encounters:  11/12/19 6' (1.829 m)    Weight:   Wt Readings from  Last 1 Encounters:  12/11/2019 76.2 kg    Ideal Body Weight:  80.9 kg  BMI:  Body mass index is 22.78 kg/m.  Estimated Nutritional Needs:   Kcal:  2200-2400  Protein:  110-130 grams  Fluid:  > 2 L    Loistine Chance, RD, LDN, [place] Registered Dietitian II Certified Diabetes Care and Education Specialist Please refer to [facility] for RD and/or RD on-call/weekend/after hours pager

## 2019-12-23 NOTE — Progress Notes (Signed)
Pt has received 2 days of ceftriaxone for his klebsiella UTI. Sensitivities are back. Ok to change to IV cefazolin to complete at total of 5d of therapy per Dr. Erlinda Hong.  CrCl ~23 ml/min   Cefazolin 1g IV q12 x5 more doses  Onnie Boer, PharmD, [place], AAHIVP, CPP Infectious Disease Pharmacist 12/23/2019 2:17 PM

## 2019-12-23 NOTE — NC FL2 (Signed)
[place] LEVEL OF CARE SCREENING TOOL     IDENTIFICATION  Patient Name: Matthew Bruce Birthdate: [birth year] Sex: male Admission Date (Current Location): 01/02/2020  [facility] and [place] Number:  Herbalist and Address:  [facility] [place], [place] [address] [place], [place] [address]      Provider Number: [id number]  Attending Physician Name and Address:  Rosalin Hawking, MD  Relative Name and Phone Number:  [name], [phone]    Current Level of Care: Hospital Recommended Level of Care: [place] Prior Approval Number:    Date Approved/Denied:   PASRR Number: [id number]  Discharge Plan: SNF    Current Diagnoses: Patient Active Problem List   Diagnosis Date Noted  . Stroke (cerebrum) ([place]) 12/05/2019  . CKD (chronic kidney disease), stage IV ([place]) 11/03/2019  . UTI (urinary tract infection) 11/03/2019  . Lung mass 11/03/2019  . Sepsis ([place]) 07/03/2018  . Seizure ([place]) 09/07/2016  . Pressure injury of skin 05/27/2016  . Elevated lactic acid level 05/26/2016  . Weakness 05/26/2016  . PAD (peripheral artery disease) ([place]) 03/24/2016  . Right foot ulcer ([place]) 03/24/2016  . Essential hypertension 02/02/2016  . Diabetes ([place]) 02/02/2016  . Atherosclerotic peripheral vascular disease with ulceration ([place]) 02/02/2016  . Valvular heart disease 02/02/2016  . Embolic stroke involving right cerebellar artery ([place]) 11/10/2014  . Hypoglycemia 11/05/2014  . Microalbuminuria 11/07/2013  . Other and unspecified hyperlipidemia 11/07/2013  . Simple chronic anemia 11/07/2013  . Type II diabetes mellitus, uncontrolled ([place]) 11/07/2013    Orientation RESPIRATION BLADDER Height & Weight     Self  Normal External catheter Weight: 167 lb 15.9 oz (76.2 kg) Height:     BEHAVIORAL SYMPTOMS/MOOD NEUROLOGICAL BOWEL NUTRITION STATUS      Incontinent Diet (See discharge summary)  AMBULATORY STATUS COMMUNICATION OF NEEDS  Skin   Extensive Assist Verbally Skin abrasions, PU Stage and Appropriate Care (Abrasion, R Elbow; PU stage 2 Coccyx, PU unstageable R heel)                       Personal Care Assistance Level of Assistance  Bathing, Feeding, Dressing   Feeding assistance: Maximum assistance Dressing Assistance: Maximum assistance     Functional Limitations Info  Sight, Hearing, Speech Sight Info: Adequate Hearing Info: Impaired (Bilateral Hearing Aids) Speech Info: Impaired (Dysarthria)    SPECIAL CARE FACTORS FREQUENCY  PT (By licensed PT), OT (By licensed OT), Speech therapy     PT Frequency: 5x week OT Frequency: 5x week     Speech Therapy Frequency: 5x week      Contractures Contractures Info: Not present    Additional Factors Info  Code Status, Allergies, Insulin Sliding Scale Code Status Info: Full Allergies Info: Sulfa Antibiotics, Macrobid (nitrofurantoin Monohyd Macro)   Insulin Sliding Scale Info: Insulin Aspart (Novolog) 0-15 U every 4 hours.       Current Medications (12/23/2019):  This is the current hospital active medication list Current Facility-Administered Medications  Medication Dose Route Frequency Provider Last Rate Last Admin  . 0.9 %  sodium chloride infusion  50 mL/hr Intravenous Continuous Greta Doom, MD 50 mL/hr at 12/22/19 1949 50 mL/hr at 12/22/19 1949  . acetaminophen (TYLENOL) tablet 650 mg  650 mg Oral Q4H PRN Greta Doom, MD       Or  . acetaminophen (TYLENOL) 160 MG/5ML solution 650 mg  650 mg Per Tube Q4H PRN Greta Doom, MD  Or  . acetaminophen (TYLENOL) suppository 650 mg  650 mg Rectal Q4H PRN Greta Doom, MD      . aspirin suppository 300 mg  300 mg Rectal Daily Rosalin Hawking, MD   300 mg at 12/21/19 1650  . ceFAZolin (ANCEF) IVPB 1 g/50 mL premix  1 g Intravenous Q12H Rosalin Hawking, MD      . collagenase (SANTYL) ointment   Topical Daily Rosalin Hawking, MD   Given at 12/23/19 1100  . feeding  supplement (OSMOLITE 1.2 CAL) liquid 1,000 mL  1,000 mL Per Tube Continuous Rosalin Hawking, MD   Held at 12/23/19 1244  . heparin injection 5,000 Units  5,000 Units Subcutaneous Q8H Rosalin Hawking, MD   5,000 Units at 12/23/19 1257  . insulin aspart (novoLOG) injection 0-15 Units  0-15 Units Subcutaneous Q4H Greta Doom, MD   2 Units at 12/22/19 2326  . labetalol (NORMODYNE) injection 5-20 mg  5-20 mg Intravenous Q2H PRN Rosalin Hawking, MD      . levETIRAcetam (KEPPRA) IVPB 500 mg/100 mL premix  500 mg Intravenous Q12H Rosalin Hawking, MD 400 mL/hr at 12/23/19 0930 500 mg at 12/23/19 0930  . pantoprazole (PROTONIX) injection 40 mg  40 mg Intravenous QHS Greta Doom, MD   40 mg at 12/22/19 2147     Discharge Medications: Please see discharge summary for a list of discharge medications.  Relevant Imaging Results:  Relevant Lab Results:   Additional Information SS# [place], [facility]

## 2019-12-23 NOTE — TOC Initial Note (Signed)
Transition of Care [facility]) - Initial/Assessment Note    Patient Details  Name: Matthew Bruce MRN: [id number] Date of Birth: 03-Feb-1927  Transition of Care [facility]) CM/SW Contact:    Coralee Pesa, [place] Phone Number: 12/23/2019, 2:27 PM  Clinical Narrative:                 CSW spoke with daughter, [name], to discuss discharge planning. Dee noted that pt's family is agreeable to SNF placement. Family is in [place], has a preference for [facility], but open to other options. Pt has had both Pfizer Covid vaccines. Will complete FL2 and faxout. Pt currently NPO, may need PEG. Insurance managed through [place], will need to be started by facility. CSW will continue to follow for discharge planning.  Expected Discharge Plan: Skilled Nursing Facility Barriers to Discharge: Continued Medical Work up, Ship broker, SNF Pending bed offer   Patient Goals and CMS Choice Patient states their goals for this hospitalization and ongoing recovery are:: Pt family agreeable to SNF placement. CMS Medicare.gov Compare Post Acute Care list provided to:: Patient Represenative (must comment) [name], Daughter) Choice offered to / list presented to : Adult Children  Expected Discharge Plan and Services Expected Discharge Plan: [place] arrangements for the past 2 months: Single Family Home                                      Prior Living Arrangements/Services Living arrangements for the past 2 months: Single Family Home Lives with:: Adult Children Patient language and need for interpreter reviewed:: Yes Do you feel safe going back to the place where you live?: Yes      Need for Family Participation in Patient Care: Yes (Comment) Care giver support system in place?: Yes (comment)   Criminal Activity/Legal Involvement Pertinent to Current Situation/Hospitalization: No - Comment as needed  Activities of Daily Living      Permission  Sought/Granted Permission sought to share information with : Family Supports    Share Information with NAME: [name]     Permission granted to share info w Relationship: Daughter  Permission granted to share info w Contact Information: [phone]  Emotional Assessment Appearance:: Other (Comment Required (Unable to Assess) Attitude/Demeanor/Rapport: Unable to Assess Affect (typically observed): Unable to Assess Orientation: : Oriented to Self Alcohol / Substance Use: Not Applicable Psych Involvement: No (comment)  Admission diagnosis:  Stroke (cerebrum) ([place]) [I63.9] Cerebrovascular accident (CVA), unspecified mechanism ([place]) [I63.9] Patient Active Problem List   Diagnosis Date Noted  . Stroke (cerebrum) ([place]) 12/16/2019  . CKD (chronic kidney disease), stage IV ([place]) 11/03/2019  . UTI (urinary tract infection) 11/03/2019  . Lung mass 11/03/2019  . Sepsis ([place]) 07/03/2018  . Seizure ([place]) 09/07/2016  . Pressure injury of skin 05/27/2016  . Elevated lactic acid level 05/26/2016  . Weakness 05/26/2016  . PAD (peripheral artery disease) ([place]) 03/24/2016  . Right foot ulcer ([place]) 03/24/2016  . Essential hypertension 02/02/2016  . Diabetes ([place]) 02/02/2016  . Atherosclerotic peripheral vascular disease with ulceration ([place]) 02/02/2016  . Valvular heart disease 02/02/2016  . Embolic stroke involving right cerebellar artery ([place]) 11/10/2014  . Hypoglycemia 11/05/2014  . Microalbuminuria 11/07/2013  . Other and unspecified hyperlipidemia 11/07/2013  . Simple chronic anemia 11/07/2013  . Type II diabetes mellitus, uncontrolled ([place]) 11/07/2013   PCP:  [name], MD Pharmacy:  [id number] - [place], [place] - [address] [place] [address] Phone: [phone] Fax: [phone]     Social Determinants of Health (SDOH) Interventions    Readmission Risk Interventions No flowsheet data found.

## 2019-12-23 NOTE — Care Management Important Message (Signed)
Important Message  Patient Details  Name: Matthew Bruce MRN: [id number] Date of Birth: 1926/04/20   Medicare Important Message Given:  Yes     Orbie Pyo 12/23/2019, 2:39 PM

## 2019-12-23 NOTE — Plan of Care (Signed)
  Problem: Education: Goal: Knowledge of disease or condition will improve Outcome: Progressing Goal: Knowledge of secondary prevention will improve Outcome: Progressing Goal: Knowledge of patient specific risk factors addressed and post discharge goals established will improve Outcome: Progressing Goal: Individualized Educational Video(s) Outcome: Progressing   Problem: Coping: Goal: Will verbalize positive feelings about self Outcome: Progressing Goal: Will identify appropriate support needs Outcome: Progressing   Problem: Health Behavior/Discharge Planning: Goal: Ability to manage health-related needs will improve Outcome: Progressing   Problem: Self-Care: Goal: Ability to participate in self-care as condition permits will improve Outcome: Progressing Goal: Verbalization of feelings and concerns over difficulty with self-care will improve Outcome: Progressing Goal: Ability to communicate needs accurately will improve Outcome: Progressing   Problem: Nutrition: Goal: Risk of aspiration will decrease Outcome: Progressing Goal: Dietary intake will improve Outcome: Progressing   Problem: Intracerebral Hemorrhage Tissue Perfusion: Goal: Complications of Intracerebral Hemorrhage will be minimized Outcome: Progressing   Problem: Ischemic Stroke/TIA Tissue Perfusion: Goal: Complications of ischemic stroke/TIA will be minimized Outcome: Progressing

## 2019-12-23 NOTE — Procedures (Signed)
Cortrak  Person Inserting Tube:  Matthew Bruce, [name], RD Tube Type:  Cortrak - 43 inches Tube Location:  Right nare Initial Placement:  Stomach Secured by: Bridle Technique Used to Measure Tube Placement:  Documented cm marking at nare/ corner of mouth Cortrak Secured At:  73 cm    Cortrak Tube Team Note:  Consult received to place a Cortrak feeding tube.   No x-ray is required. RN may begin using tube.    If the tube becomes dislodged please keep the tube and contact the Cortrak team at [email or web address] (password [email or web address]) for replacement.  If after hours and replacement cannot be delayed, place a NG tube and confirm placement with an abdominal x-ray.    Larkin Ina, MS, RD, LDN RD pager number and weekend/on-call pager number located in [place].

## 2019-12-23 NOTE — Progress Notes (Signed)
STROKE TEAM PROGRESS NOTE   INTERVAL HISTORY Daughter at bedside.  Patient lying in bed, continues to be lethargic.  Had a modified barium study today showed silence aspiration.  Discussed with daughter regarding tube feeding versus comfort feeds, daughter would like to have tube feeding.  She is okay with long-term PEG tube if needed.  OBJECTIVE Vitals:   12/22/19 2023 12/22/19 2318 12/23/19 0336 12/23/19 0717  BP: 127/71 (!) 153/82 (!) 158/86 (!) 142/80  Pulse: 93 92 97 96  Resp:  18 18 18   Temp: 97.6 F (36.4 C) 97.6 F (36.4 C) 97.8 F (36.6 C) 97.6 F (36.4 C)  TempSrc: Oral Oral Oral Oral  SpO2: 99% 100% 100% 98%  Weight:       CBC:  Recent Labs  Lab 12/30/2019 1508 12/29/2019 1515 12/22/19 0632 12/23/19 0209  WBC 6.0   < > 6.0 5.5  NEUTROABS 3.4  --   --   --   HGB 10.8*   < > 10.8* 9.9*  HCT 33.4*   < > 33.2* 30.9*  MCV 87.4   < > 87.8 88.0  PLT 329   < > 348 350   < > = values in this interval not displayed.   Basic Metabolic Panel:  Recent Labs  Lab 12/22/19 0632 12/23/19 0209  NA 139 145  K 5.0 4.3  CL 112* 116*  CO2 12* 15*  GLUCOSE 142* 91  BUN 36* 36*  CREATININE 2.33* 2.29*  CALCIUM 9.9 9.9   Lipid Panel:     Component Value Date/Time   CHOL 121 12/21/2019 0312   TRIG 141 12/21/2019 0312   HDL 37 (L) 12/21/2019 0312   CHOLHDL 3.3 12/21/2019 0312   VLDL 28 12/21/2019 0312   LDLCALC 56 12/21/2019 0312   HgbA1c:  Lab Results  Component Value Date   HGBA1C 7.5 (H) 12/21/2019   Urine Drug Screen:     Component Value Date/Time   LABOPIA NONE DETECTED 12/24/2019 2229   COCAINSCRNUR NONE DETECTED 12/06/2019 2229   LABBENZ NONE DETECTED 12/13/2019 2229   AMPHETMU NONE DETECTED 12/30/2019 2229   THCU NONE DETECTED 12/06/2019 2229   LABBARB NONE DETECTED 12/07/2019 2229    Alcohol Level     Component Value Date/Time   ETH <10 12/12/2019 1508    IMAGING CT HEAD CODE STROKE WO CONTRAST 12/29/2019 IMPRESSION:  No CT evidence of acute  intracranial abnormality. ASPECTS is 10. Known small chronic infarcts within the brainstem and cerebellum, some of which were better appreciated on the prior MRI of 09/07/2016. Stable background mild cerebral white matter chronic small vessel ischemic disease and moderately advanced generalized cerebral atrophy. Ethmoid and right maxillary sinusitis. Bilateral mastoid effusions.   CT ANGIO NECK CODE STROKE 12/10/2019 1. The bilateral common and internal carotid arteries are patent within the neck without hemodynamically significant stenosis. Soft and calcified plaque within the bilateral carotid systems as described.  2. The vertebral arteries are patent within the neck bilaterally. Moderate atherosclerotic narrowing at the origin of the right vertebral artery.  3. Redemonstrated pulmonary opacity within the right upper lobe which is incompletely imaged on the current examination but measures at least 5.4 cm. This finding is nonspecific but malignancy remains a differential consideration. Follow-up contrast-enhanced chest CT or PET-CT should be considered for further evaluation   CT ANGIO HEAD CODE STROKE 01/01/2020 1. No intracranial large vessel occlusion is identified.  2. High-grade stenosis within the P2 left posterior cerebral artery.  3. Calcified plaque within both  intracranial internal carotid arteries. Moderate/severe stenosis of the paraclinoid right ICA. Moderate stenosis of the paraclinoid left ICA.   CT CEREBRAL PERFUSION W CONTRAST 12/06/2019 The perfusion software identifies no core infarct. The perfusion software identifies no critically hypoperfused parenchyma utilizing the Tmax>6 seconds threshold.   MRI Brain WO Contrast 12/21/19 1. Acute small vessel infarct tracking from the posterior left corona radiata to the posterior left lentiform. No associated hemorrhage or mass effect.  2. Possible additional punctate acute lacunar infarct in the anterior right frontal lobe white  matter, such as due to synchronous acute on chronic small vessel disease.  EEG adult 12/07/2019 ABNORMALITY  Continuous slow, generalized  IMPRESSION:  This study is suggestive of mild to moderate diffuse encephalopathy, nonspecific etiology. No seizures or epileptiform discharges were seen throughout the recording.   Transthoracic Echocardiogram  1. Left ventricular ejection fraction, by estimation, is 60 to 65%. The left ventricle has normal function. The left ventricle has no regional wall motion abnormalities. There is moderate left ventricular hypertrophy. Left ventricular diastolic parameters are consistent with Grade I diastolic dysfunction (impaired relaxation). Elevated left atrial pressure.  2. Right ventricular systolic function is normal. The right ventricular size is normal. There is mildly elevated pulmonary artery systolic pressure. The estimated right ventricular systolic pressure is 55.7 mmHg.  3. The mitral valve is degenerative. No evidence of mitral valve regurgitation. No evidence of mitral stenosis. Severe mitral annular calcification.  4. The aortic valve has been repaired/replaced. Aortic valve regurgitation is not visualized. No aortic stenosis is present. There is a St. Jude valve present in the aortic position. Echo findings are consistent with normal structure and function of the aortic valve prosthesis. Aortic valve mean gradient measures 9.3 mmHg. Aortic valve Vmax measures 2.08 m/s.  5. The inferior vena cava is normal in size with greater than 50% respiratory variability, suggesting right atrial pressure of 3 mmHg.  ECG - sinus or ectopic atrial rythym - rate 91 BPM. (See cardiology reading for complete details)   PHYSICAL EXAM    Temp:  [97.5 F (36.4 C)-99.1 F (37.3 C)] 97.6 F (36.4 C) (09/20 0717) Pulse Rate:  [91-97] 96 (09/20 0717) Resp:  [18] 18 (09/20 0717) BP: (127-158)/(69-86) 142/80 (09/20 0717) SpO2:  [98 %-100 %] 98 % (09/20 0717)  General  - Well nourished, well developed, lethargic  Ophthalmologic - fundi not visualized due to noncooperation.  Cardiovascular - Regular rhythm and rate.  Neuro - lethargic, eyes open today. Orientated to age but not to time place or people or time. Severe dysarthria. Able to have bilateral gaze on voice. Frequent spontaneous blinking, not able to test visual field. PERRL. Right facial droop. Tongue midline protrusion. RUE flaccid, RLE slight withdraw to pain not against gravity. LUE 4/5 and LLE 3-/5 on pain stimulation. No babinski bilaterally. Sensation, coordination and gait not tested.   ASSESSMENT/PLAN Mr. TRESTON COKER is a 86 y.o. male with history of CVA, type 2 diabetes mellitus, HTN, HLD, hearing impaired (bilateral hearing aids), CAD (MI - CABG), BPH, ASPVD, AVR (St Jude - Porcine) seizure disorder, right heel ulcer, and chronic kidney disease stage IV presenting with speech difficulties and right sided weakness. The patient received IV t-PA Friday 12/18/2019 at 1530.  Stroke: left BG and CR infarct as well as punctate right frontal WM infarct s/p tPA, cardioembolic source vs. Synchronized small vessel disease.  CT Head - No CT evidence of acute intracranial abnormality. ASPECTS is 10. Known small chronic infarcts within the brainstem and  cerebellum..   CTA Head and neck - High-grade stenosis within the P2 left posterior cerebral artery. Moderate/severe stenosis of the paraclinoid right ICA. Moderate stenosis of the paraclinoid left ICA. No intracranial large vessel occlusion is identified.   CTP - neg.  MRI head - left BG and CR infarct as well as punctate right frontal WM infarct  EEG negative for seizrue, mild to moderate diffuse encephalopathy  2D Echo - EF 60-65%, st Jude AV  Consider 30 day cardiac event monitoring as outpt  [employer] Virus 2 - negative  LDL - 56  HgbA1c - 7.5  UDS - negative  VTE prophylaxis - SCDs  clopidogrel 75 mg daily prior to admission,  now on ASA 325 mg after po access. No DAPT for now due to possible PEG placement   Therapy recommendations:  HH Therapy vs SNF - daughter leaning SNF  Disposition:  Pending  Given poor condition PTA and now stroke w/ poor prognosis, Dr. Erlinda Hong discussed feeding options with daughter. She is interested in cortrak now and possible PEG later as patient wife had one before and family is familiar with PEG  Hx of stroke and seizure  11/2014, admitted for dizziness, found to have right cerebellum infarct, put on aspirin  09/2016, presented with seizure x2, put on Keppra, MRI negative.  Continue Keppra on discharge, no more seizure since  UTI  UA WBC > 50  Urine culture - >100k Klebsiella - sensitive to ancef  Rocephin 9/18>>9/20  Ancef 9/20  Dysphagia   Did not pass swallow  Now NPO   S/p cortrak  decrease IVF  Speech following  MBSS 9/20 -> NPO recommended w/ meds via alternative means  Dr. Erlinda Hong discussed feeding options with daughter. She is interested in cortrak now and possible PEG later as patient wife had one before and family is familiar with PEG  Hypertension  Home BP meds: Norvasc  Current BP meds: prn Labetalol  Stable  BP goal < 180/105 . Long-term BP goal normotensive  Hyperlipidemia  Home Lipid lowering medication: Mevacor 40 mg daily  LDL 56, goal < 70  Resume statin pravastatin 40  Continue statin at discharge  Diabetes  Home diabetic meds: metformin ; Glucotrol  Current diabetic meds: SSI   HgbA1c 7.5, goal < 7.0  CBG monitoring  Close PCP follow-up  Other Stroke Risk Factors  Advanced age  Former cigarette smoker - quit  Coronary artery disease  Other Active Problems  Code status - Full code  Seizure disorder - Keppra  Anemia due to CKD- Hgb - 10.7->10.8->9.9  CKD stage IV - creatinine -2.32->2.35->2.33->2.29  Pulmonary opacity within the right upper lobe which is incompletely imaged - measures at least 5.4 cm. - follow-up  contrast-enhanced chest CT or PET-CT should be considered for further evaluation.  Right heel ulcer - on dressing (bed bound at home largely but able to stand up with walker). WOC consulted. Ulcer unstageable d/t chronicity. dressing orders put in place. To resume f/uy w/ Dr. Cleda Mccreedy at d/c..  Hospital day # 3  I had long discussion with daughter at bedside, updated pt current condition, treatment plan and potential prognosis, and answered all the questions. She expressed understanding and appreciation.   Rosalin Hawking, MD PhD Stroke Neurology 12/23/2019 10:42 AM  To contact Stroke Continuity provider, please refer to [email or web address] After hours, contact General Neurology

## 2019-12-23 NOTE — Progress Notes (Addendum)
Pt's CBG 77 a trend down. Aroor text through texting system about CBG and possible adding supplemental fluid d/t pt being NPO and h/o DM. Per Aroor, "will not add supplemental fluids".

## 2019-12-23 NOTE — Consult Note (Signed)
[facility] Nurse Consult Note: Reason for Consult: Consult requested for right heel. Performed remotely after review of the progress notes in the EMR. Pt is familiar to the [facility] team from a previous admission and is followed by Dr Cleda Mccreedy of the podiatry team as an outpatient and he has been ordered to apply Santyl daily.  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  Float heels to reduce pressure. Pt should resume follow-up with Dr Cleda Mccreedy after discharge. Please re-consult if further assistance is needed.  Thank-you,  Julien Girt MSN, [place], [place], [place], [place]

## 2019-12-24 DIAGNOSIS — N179 Acute kidney failure, unspecified: Secondary | ICD-10-CM

## 2019-12-24 DIAGNOSIS — N1832 Chronic kidney disease, stage 3b: Secondary | ICD-10-CM

## 2019-12-24 LAB — BASIC METABOLIC PANEL
Anion gap: 10 (ref 5–15)
BUN: 35 mg/dL — ABNORMAL HIGH (ref 8–23)
CO2: 14 mmol/L — ABNORMAL LOW (ref 22–32)
Calcium: 9.6 mg/dL (ref 8.9–10.3)
Chloride: 119 mmol/L — ABNORMAL HIGH (ref 98–111)
Creatinine, Ser: 1.81 mg/dL — ABNORMAL HIGH (ref 0.61–1.24)
GFR calc Af Amer: 37 mL/min — ABNORMAL LOW (ref >60)
GFR calc non Af Amer: 32 mL/min — ABNORMAL LOW (ref >60)
Glucose, Bld: 184 mg/dL — ABNORMAL HIGH (ref 70–99)
Potassium: 3.9 mmol/L (ref 3.5–5.1)
Sodium: 143 mmol/L (ref 135–145)

## 2019-12-24 LAB — CBC
HCT: 30.5 % — ABNORMAL LOW (ref 39.0–52.0)
Hemoglobin: 9.5 g/dL — ABNORMAL LOW (ref 13.0–17.0)
MCH: 27.4 pg (ref 26.0–34.0)
MCHC: 31.1 g/dL (ref 30.0–36.0)
MCV: 87.9 fL (ref 80.0–100.0)
Platelets: 279 10*3/uL (ref 150–400)
RBC: 3.47 MIL/uL — ABNORMAL LOW (ref 4.22–5.81)
RDW: 14.6 % (ref 11.5–15.5)
WBC: 5.4 10*3/uL (ref 4.0–10.5)
nRBC: 0 % (ref 0.0–0.2)

## 2019-12-24 LAB — GLUCOSE, CAPILLARY
Glucose-Capillary: 149 mg/dL — ABNORMAL HIGH (ref 70–99)
Glucose-Capillary: 197 mg/dL — ABNORMAL HIGH (ref 70–99)
Glucose-Capillary: 225 mg/dL — ABNORMAL HIGH (ref 70–99)
Glucose-Capillary: 236 mg/dL — ABNORMAL HIGH (ref 70–99)
Glucose-Capillary: 259 mg/dL — ABNORMAL HIGH (ref 70–99)
Glucose-Capillary: 285 mg/dL — ABNORMAL HIGH (ref 70–99)

## 2019-12-24 LAB — PHOSPHORUS: Phosphorus: 3.2 mg/dL (ref 2.5–4.6)

## 2019-12-24 LAB — MAGNESIUM: Magnesium: 1.7 mg/dL (ref 1.7–2.4)

## 2019-12-24 MED ORDER — PANTOPRAZOLE SODIUM 40 MG PO PACK
40.0000 mg | PACK | Freq: Every day | ORAL | Status: DC
  Administered 2019-12-24 – 2019-12-27 (×4): 40 mg
  Filled 2019-12-24 (×4): qty 20

## 2019-12-24 MED ORDER — FINASTERIDE 5 MG PO TABS: 5.0000 mg | ORAL_TABLET | Freq: Every day | ORAL | Status: DC

## 2019-12-24 MED ORDER — TAMSULOSIN HCL 0.4 MG PO CAPS: 0.4000 mg | ORAL_CAPSULE | Freq: Every day | ORAL | Status: DC

## 2019-12-24 MED ORDER — LEVETIRACETAM 100 MG/ML PO SOLN
500.0000 mg | Freq: Two times a day (BID) | ORAL | Status: DC
  Administered 2019-12-24 – 2019-12-27 (×6): 500 mg
  Filled 2019-12-24 (×6): qty 5

## 2019-12-24 NOTE — Consult Note (Signed)
   [facility] Inpatient Consult   12/24/2019  SEFERINO OSCAR 02-26-1927 [id number]  [facility] [ACO] Patient: [employer]  Patient was assessed for [facility] for community services. Patient was previously active with [facility]. Chart review reveals patient is being recommended for 24 hour supervision care verse Skilled Nursing facility for rehab. Plan:  If patient goes to SNF, his needs will be met at that level of care. Touched basis with inpatient [facility] team as well. Of note, [facility] Care Management services does not replace or interfere with any services that are arranged by inpatient [facility] care management team.   For additional questions or referrals please contact: Natividad Brood, RN BSN [facility] Liaison  [phone] business mobile phone Toll free office [phone]  Fax number: [phone] [email or web address]

## 2019-12-24 NOTE — Progress Notes (Signed)
STROKE TEAM PROGRESS NOTE   INTERVAL HISTORY Daughter at bedside.  Patient lying in bed, still lethargic, able to open eyes on voice, on tube feeding and p.o. meds.  Creatinine improved.  OBJECTIVE Vitals:   12/23/19 1918 12/23/19 2325 12/24/19 0326 12/24/19 0842  BP: (!) 162/82 (!) 158/71 (!) 147/75 (!) 141/70  Pulse: 94 95 91 92  Resp: 19 18 17 16   Temp: 98 F (36.7 C) (!) 97.5 F (36.4 C) 98 F (36.7 C) 97.8 F (36.6 C)  TempSrc:  Oral Oral Oral  SpO2: 96% 100% 99% 100%  Weight:       CBC:  Recent Labs  Lab 12/21/2019 1508 12/08/2019 1515 12/23/19 0209 12/24/19 0415  WBC 6.0   < > 5.5 5.4  NEUTROABS 3.4  --   --   --   HGB 10.8*   < > 9.9* 9.5*  HCT 33.4*   < > 30.9* 30.5*  MCV 87.4   < > 88.0 87.9  PLT 329   < > 350 279   < > = values in this interval not displayed.   Basic Metabolic Panel:  Recent Labs  Lab 12/23/19 0209 12/24/19 0415  NA 145 143  K 4.3 3.9  CL 116* 119*  CO2 15* 14*  GLUCOSE 91 184*  BUN 36* 35*  CREATININE 2.29* 1.81*  CALCIUM 9.9 9.6  MG  --  1.7  PHOS  --  3.2   Lipid Panel:     Component Value Date/Time   CHOL 121 12/21/2019 0312   TRIG 141 12/21/2019 0312   HDL 37 (L) 12/21/2019 0312   CHOLHDL 3.3 12/21/2019 0312   VLDL 28 12/21/2019 0312   LDLCALC 56 12/21/2019 0312   HgbA1c:  Lab Results  Component Value Date   HGBA1C 7.5 (H) 12/21/2019   Urine Drug Screen:     Component Value Date/Time   LABOPIA NONE DETECTED 12/09/2019 2229   COCAINSCRNUR NONE DETECTED 12/06/2019 2229   LABBENZ NONE DETECTED 12/04/2019 2229   AMPHETMU NONE DETECTED 12/13/2019 2229   THCU NONE DETECTED 12/09/2019 2229   LABBARB NONE DETECTED 12/15/2019 2229    Alcohol Level     Component Value Date/Time   ETH <10 12/30/2019 1508    IMAGING CT HEAD CODE STROKE WO CONTRAST 12/15/2019 IMPRESSION:  No CT evidence of acute intracranial abnormality. ASPECTS is 10. Known small chronic infarcts within the brainstem and cerebellum, some of which  were better appreciated on the prior MRI of 09/07/2016. Stable background mild cerebral white matter chronic small vessel ischemic disease and moderately advanced generalized cerebral atrophy. Ethmoid and right maxillary sinusitis. Bilateral mastoid effusions.   CT ANGIO NECK CODE STROKE 12/26/2019 1. The bilateral common and internal carotid arteries are patent within the neck without hemodynamically significant stenosis. Soft and calcified plaque within the bilateral carotid systems as described.  2. The vertebral arteries are patent within the neck bilaterally. Moderate atherosclerotic narrowing at the origin of the right vertebral artery.  3. Redemonstrated pulmonary opacity within the right upper lobe which is incompletely imaged on the current examination but measures at least 5.4 cm. This finding is nonspecific but malignancy remains a differential consideration. Follow-up contrast-enhanced chest CT or PET-CT should be considered for further evaluation   CT ANGIO HEAD CODE STROKE 12/29/2019 1. No intracranial large vessel occlusion is identified.  2. High-grade stenosis within the P2 left posterior cerebral artery.  3. Calcified plaque within both intracranial internal carotid arteries. Moderate/severe stenosis of the paraclinoid right  ICA. Moderate stenosis of the paraclinoid left ICA.   CT CEREBRAL PERFUSION W CONTRAST 12/22/2019 The perfusion software identifies no core infarct. The perfusion software identifies no critically hypoperfused parenchyma utilizing the Tmax>6 seconds threshold.   MRI Brain WO Contrast 12/21/19 1. Acute small vessel infarct tracking from the posterior left corona radiata to the posterior left lentiform. No associated hemorrhage or mass effect.  2. Possible additional punctate acute lacunar infarct in the anterior right frontal lobe white matter, such as due to synchronous acute on chronic small vessel disease.  EEG adult 12/14/2019 ABNORMALITY  Continuous  slow, generalized  IMPRESSION:  This study is suggestive of mild to moderate diffuse encephalopathy, nonspecific etiology. No seizures or epileptiform discharges were seen throughout the recording.   Transthoracic Echocardiogram  1. Left ventricular ejection fraction, by estimation, is 60 to 65%. The left ventricle has normal function. The left ventricle has no regional wall motion abnormalities. There is moderate left ventricular hypertrophy. Left ventricular diastolic parameters are consistent with Grade I diastolic dysfunction (impaired relaxation). Elevated left atrial pressure.  2. Right ventricular systolic function is normal. The right ventricular size is normal. There is mildly elevated pulmonary artery systolic pressure. The estimated right ventricular systolic pressure is 69.6 mmHg.  3. The mitral valve is degenerative. No evidence of mitral valve regurgitation. No evidence of mitral stenosis. Severe mitral annular calcification.  4. The aortic valve has been repaired/replaced. Aortic valve regurgitation is not visualized. No aortic stenosis is present. There is a St. Jude valve present in the aortic position. Echo findings are consistent with normal structure and function of the aortic valve prosthesis. Aortic valve mean gradient measures 9.3 mmHg. Aortic valve Vmax measures 2.08 m/s.  5. The inferior vena cava is normal in size with greater than 50% respiratory variability, suggesting right atrial pressure of 3 mmHg.  ECG - sinus or ectopic atrial rythym - rate 91 BPM. (See cardiology reading for complete details)   PHYSICAL EXAM   Temp:  [97.4 F (36.3 C)-98 F (36.7 C)] 97.8 F (36.6 C) (09/21 0842) Pulse Rate:  [91-98] 92 (09/21 0842) Resp:  [16-19] 16 (09/21 0842) BP: (132-162)/(70-91) 141/70 (09/21 0842) SpO2:  [91 %-100 %] 100 % (09/21 0842)  General - Well nourished, well developed, lethargic  Ophthalmologic - fundi not visualized due to  noncooperation.  Cardiovascular - Regular rhythm and rate.  Neuro - lethargic, eyes open on voice. Orientated to age and people but not to place or time. Severe dysarthria. Able to have bilateral gaze on voice. Frequent spontaneous blinking, not able to test visual field. PERRL. Right facial droop. Tongue midline protrusion. RUE flaccid, RLE slight withdraw to pain not against gravity. LUE 3/5 and LLE 2/5 on pain stimulation. No babinski bilaterally. Sensation, coordination and gait not tested.   ASSESSMENT/PLAN [name] is a 86 y.o. male with history of CVA, type 2 diabetes mellitus, HTN, HLD, hearing impaired (bilateral hearing aids), CAD (MI - CABG), BPH, ASPVD, AVR (St Jude - Porcine) seizure disorder, right heel ulcer, and chronic kidney disease stage IV presenting with speech difficulties and right sided weakness. The patient received IV t-PA Friday 12/10/2019 at 1530.  Stroke: left BG and CR infarct as well as punctate right frontal WM infarct s/p tPA, cardioembolic source vs. Synchronized small vessel disease.  CT Head - No CT evidence of acute intracranial abnormality. ASPECTS is 10. Known small chronic infarcts within the brainstem and cerebellum..   CTA Head and neck - High-grade stenosis within  the P2 left posterior cerebral artery. Moderate/severe stenosis of the paraclinoid right ICA. Moderate stenosis of the paraclinoid left ICA. No intracranial large vessel occlusion is identified.   CTP - neg.  MRI head - left BG and CR infarct as well as punctate right frontal WM infarct  EEG negative for seizrue, mild to moderate diffuse encephalopathy  2D Echo - EF 60-65%, st Jude AV  Consider 30 day cardiac event monitoring as outpt  [employer] Virus 2 - negative  LDL - 56  HgbA1c - 7.5  UDS - negative  VTE prophylaxis - SCDs  clopidogrel 75 mg daily prior to admission, now on ASA 325 mg after po access. No DAPT for now due to possible PEG placement   Therapy  recommendations:  HH PT  vs SNF - daughter leaning SNF  Disposition:  Pending  Given poor condition PTA and now stroke w/ poor prognosis, discussed potential PEG with daughter. She is OK with PEG if needed as patient wife had one before and family is familiar with PEG  Hx of stroke and seizure  11/2014, admitted for dizziness, found to have right cerebellum infarct, put on aspirin  09/2016, presented with seizure x2, put on Keppra, MRI negative.  Continue Keppra on discharge, no more seizure since  UTI  UA WBC > 50  Urine culture - >100k Klebsiella - sensitive to ancef  Rocephin 9/18>>9/20  Ancef 9/20>>  Dysphagia   Did not pass swallow  Now NPO S/p cortrak  decrease IVF  Speech following  MBSS 9/20 -> NPO recommended w/ meds via alternative means  discussed potential PEG with daughter. She is OK with PEG if needed as patient wife had one before and family is familiar with PEG  Hypertension  Home BP meds: Norvasc  Current BP meds: prn Labetalol  Stable  BP goal < 180/105 . Long-term BP goal normotensive  Hyperlipidemia  Home Lipid lowering medication: Mevacor 40 mg daily  LDL 56, goal < 70  Resume pravastatin 40  Continue statin at discharge  Diabetes  Home diabetic meds: metformin ; Glucotrol  Current diabetic meds: SSI   HgbA1c 7.5, goal < 7.0  CBG monitoring  Hyperglycemia  Diabetic coordinator consult requested  Close PCP follow-up  Other Stroke Risk Factors  Advanced age  Former cigarette smoker - quit  Coronary artery disease  Other Active Problems  Code status - Full code  Seizure disorder - Keppra 500 bid  Anemia due to CKD- Hgb - 10.7->10.8->9.9->9.5  CKD stage IV - creatinine -2.32->2.35->2.33->2.29->1.81 - continue IVF  Pulmonary opacity within the right upper lobe which is incompletely imaged - measures at least 5.4 cm. - follow-up contrast-enhanced chest CT or PET-CT should be considered for further  evaluation.  Right heel ulcer - on dressing (bed bound at home largely but able to stand up with walker). WOC consulted. Ulcer unstageable d/t chronicity. dressing orders put in place. To resume f/uy w/ Dr. Cleda Mccreedy at d/c..  Hospital day # 4  Rosalin Hawking, MD PhD Stroke Neurology 12/24/2019 11:33 AM  To contact Stroke Continuity provider, please refer to [email or web address] After hours, contact General Neurology

## 2019-12-25 LAB — MAGNESIUM: Magnesium: 1.6 mg/dL — ABNORMAL LOW (ref 1.7–2.4)

## 2019-12-25 LAB — CBC
HCT: 29.8 % — ABNORMAL LOW (ref 39.0–52.0)
Hemoglobin: 9.6 g/dL — ABNORMAL LOW (ref 13.0–17.0)
MCH: 27.3 pg (ref 26.0–34.0)
MCHC: 32.2 g/dL (ref 30.0–36.0)
MCV: 84.7 fL (ref 80.0–100.0)
Platelets: 265 10*3/uL (ref 150–400)
RBC: 3.52 MIL/uL — ABNORMAL LOW (ref 4.22–5.81)
RDW: 14.3 % (ref 11.5–15.5)
WBC: 6.1 10*3/uL (ref 4.0–10.5)
nRBC: 0 % (ref 0.0–0.2)

## 2019-12-25 LAB — BASIC METABOLIC PANEL
Anion gap: 9 (ref 5–15)
BUN: 53 mg/dL — ABNORMAL HIGH (ref 8–23)
CO2: 18 mmol/L — ABNORMAL LOW (ref 22–32)
Calcium: 9.4 mg/dL (ref 8.9–10.3)
Chloride: 115 mmol/L — ABNORMAL HIGH (ref 98–111)
Creatinine, Ser: 1.35 mg/dL — ABNORMAL HIGH (ref 0.61–1.24)
GFR calc Af Amer: 53 mL/min — ABNORMAL LOW (ref >60)
GFR calc non Af Amer: 46 mL/min — ABNORMAL LOW (ref >60)
Glucose, Bld: 254 mg/dL — ABNORMAL HIGH (ref 70–99)
Potassium: 3.5 mmol/L (ref 3.5–5.1)
Sodium: 142 mmol/L (ref 135–145)

## 2019-12-25 LAB — GLUCOSE, CAPILLARY
Glucose-Capillary: 221 mg/dL — ABNORMAL HIGH (ref 70–99)
Glucose-Capillary: 266 mg/dL — ABNORMAL HIGH (ref 70–99)
Glucose-Capillary: 267 mg/dL — ABNORMAL HIGH (ref 70–99)
Glucose-Capillary: 318 mg/dL — ABNORMAL HIGH (ref 70–99)
Glucose-Capillary: 347 mg/dL — ABNORMAL HIGH (ref 70–99)
Glucose-Capillary: 362 mg/dL — ABNORMAL HIGH (ref 70–99)

## 2019-12-25 LAB — PHOSPHORUS: Phosphorus: 1.3 mg/dL — ABNORMAL LOW (ref 2.5–4.6)

## 2019-12-25 MED ORDER — AMLODIPINE BESYLATE 5 MG PO TABS
5.0000 mg | ORAL_TABLET | Freq: Two times a day (BID) | ORAL | Status: DC
  Filled 2019-12-25: qty 1

## 2019-12-25 MED ORDER — JUVEN PO PACK
1.0000 | PACK | Freq: Two times a day (BID) | ORAL | Status: DC
  Administered 2019-12-25 – 2019-12-27 (×4): 1
  Filled 2019-12-25 (×4): qty 1

## 2019-12-25 MED ORDER — ASPIRIN 325 MG PO TABS
325.0000 mg | ORAL_TABLET | Freq: Every day | ORAL | Status: DC
  Administered 2019-12-26 – 2019-12-27 (×2): 325 mg
  Filled 2019-12-25 (×2): qty 1

## 2019-12-25 MED ORDER — AMLODIPINE BESYLATE 5 MG PO TABS
5.0000 mg | ORAL_TABLET | Freq: Two times a day (BID) | ORAL | Status: DC
  Administered 2019-12-25 – 2019-12-27 (×4): 5 mg
  Filled 2019-12-25 (×3): qty 1

## 2019-12-25 MED ORDER — INSULIN ASPART 100 UNIT/ML ~~LOC~~ SOLN
5.0000 [IU] | SUBCUTANEOUS | Status: DC
  Administered 2019-12-25 – 2019-12-26 (×5): 5 [IU] via SUBCUTANEOUS

## 2019-12-25 NOTE — Progress Notes (Addendum)
Physical Therapy Treatment Patient Details Name: Matthew Bruce MRN: [id number] DOB: 1926/10/11 Today's Date: 12/25/2019    History of Present Illness Pt is a 86 y.o. male with presenting 12/12/2019 with speech difficulties and right sided weakness. Recieved IV TPA. MRI positive for left BG and CR infarct as well as punctate right frontal WM infarct.history of CVA, DM2, HTN, HLD, hearing impaired (bilateral hearing aids), CAD (MI, CABG), BPH, ASPVD, AVR (St Jude - Porcine) seizure disorder, right heel ulcer, CKD IV.   PT Comments    Pt lethargic this session with minimal command following, becoming somewhat more alert with bed moved to chair position. Daughter present and supportive, pt responding more to her than PT/tech. TotalA for bed mobility and repositioning for pressure relief and upright trunk posture. No muscle activation noted in RUE/RLE, although pt endorses RUE pain with repositioning; <3/5 LUE/LLE strength observed. Difficult to assess true cognitive impairment vs. HOH vs. fatigue/lethargy. Daughter (who is pt's caretaker) agreeable to SNF-level therapies.    Follow Up Recommendations  SNF;Supervision/Assistance - 24 hour     Equipment Recommendations   (defer)    Recommendations for Other Services       Precautions / Restrictions Precautions Precautions: Fall Precaution Comments: Heel and sacral wounds (daughter has purchased sheep skin ankle wraps for protection) Restrictions Weight Bearing Restrictions: No    Mobility  Bed Mobility Overal bed mobility: Needs Assistance Bed Mobility: Rolling Rolling: Total assist         General bed mobility comments: TotalA to roll towards L-side for repositioning; totalA+2 to slide up and bed placed in chair position with HOB 60' degrees  Transfers                 General transfer comment: Will require totalA transfer with maximove lift  Ambulation/Gait                 Stairs              Wheelchair Mobility      Modified Rankin (Stroke Patients Only) Modified Rankin (Stroke Patients Only) Pre-Morbid Rankin Score: Severe disability Modified Rankin: Severe disability     Balance                                            Cognition Arousal/Alertness: Lethargic Behavior During Therapy: Flat affect Overall Cognitive Status: Impaired/Different from baseline Area of Impairment: Following commands;Attention;Problem solving                   Current Attention Level: Focused   Following Commands: Follows one step commands inconsistently     Problem Solving: Slow processing;Difficulty sequencing General Comments: Daughter present and reports pt has been saying words to her, mumbling but she can understand appropriate answers, recognized her. Pt appears lethargic, becoming more alert once HOB fully upright in chair position      Exercises Other Exercises Other Exercises: AAROM washing face with L hand, L wrist/fingers flex/ext, elbow flexion - pt quick to fatigue with tremulous movements Other Exercises: Able to wiggle L toes and ankle; AAROM L knee flex/ext with minimal activation noted - difficult to assess true weakness vs. cognition vs. HOH Other Exercises: Educ daughter to encourage AROM as much as possible (she reports she noticed pt moving RLE earlier)    General Comments General comments (skin integrity, edema, etc.): Daughter present and supportive, pt  responding more to her than therapist/tech      Pertinent Vitals/Pain Pain Assessment: Faces Faces Pain Scale: Hurts a little bit Pain Location: RUE with palpation Pain Descriptors / Indicators: Grimacing Pain Intervention(s): Monitored during session;Repositioned    Home Living                      Prior Function            PT Goals (current goals can now be found in the care plan section) Acute Rehab PT Goals Patient Stated Goal: Post-acute rehab at SNF PT  Goal Formulation: With family Progress towards PT goals: Not progressing toward goals - comment (lethargy)    Frequency    Min 3X/week      PT Plan Current plan remains appropriate    Co-evaluation              AM-PAC PT "6 Clicks" Mobility   Outcome Measure  Help needed turning from your back to your side while in a flat bed without using bedrails?: Total Help needed moving from lying on your back to sitting on the side of a flat bed without using bedrails?: Total Help needed moving to and from a bed to a chair (including a wheelchair)?: Total Help needed standing up from a chair using your arms (e.g., wheelchair or bedside chair)?: Total Help needed to walk in hospital room?: Total Help needed climbing 3-5 steps with a railing? : Total 6 Click Score: 6    End of Session   Activity Tolerance: Patient limited by lethargy Patient left: in bed;with call bell/phone within reach;with bed alarm set;with family/visitor present Nurse Communication: Mobility status PT Visit Diagnosis: Other abnormalities of gait and mobility (R26.89);Other symptoms and signs involving the nervous system (R29.898);Hemiplegia and hemiparesis Hemiplegia - Right/Left: Right Hemiplegia - dominant/non-dominant: Dominant Hemiplegia - caused by: Cerebral infarction     Time: 7846-9629 PT Time Calculation (min) (ACUTE ONLY): 29 min  Charges:  $Therapeutic Exercise: 8-22 mins $Therapeutic Activity: 8-22 mins                     Mabeline Caras, PT, DPT Acute Rehabilitation Services  Pager [phone] Office Croom 12/25/2019, 1:00 PM

## 2019-12-25 NOTE — Progress Notes (Addendum)
  Speech Language Pathology Treatment: Dysphagia;Cognitive-Linguistic  Patient Details Name: Matthew Bruce MRN: [id number] DOB: 1926/12/21 Today's Date: 12/25/2019 Time: 0388-8280 SLP Time Calculation (min) (ACUTE ONLY): 30 min  Assessment / Plan / Recommendation Clinical Impression  Pt seen for follow up for skilled ST intervention targeting goals for PO readiness and increased communicative effectiveness. Pt's daughter Matthew Bruce was present today. Green swab noted soaking in water. Daughter reported it had been there since yesterday, but had not been used today. Swab/cup thrown in trash and daughter educated regarding the importance of using swabs only once (and not allowed to soak in water after use) to minimize bacterial growth. Suction was set up to facilitate effective and efficient oral care, which was performed after set up. RN arrived to ask about medications and whether they should be PO or via Cortrak. Given MBS 12/23/19 results indicating "silent aspiration of all liquid consistencies and puree", recommend medications be provided via cortrak to minimize aspiration risk. Daughter indicated MD wants to repeat MBS in 2 days.   Will schedule repeat MBS for Friday 12/27/19 to reassess swallow function and safety, as well as to determine if PO intake is appropriate.   Pt requires mod+ multimodal cues to follow directions during oral care (stick out tongue, close lips). Voicing was minimal, but clear. Pt able to answer simple yes/no questions with min cues and/or repetition. Intelligibility is reduced at single word level, even with context known.   Recommend Palliative Care consult to facilitate establishment of appropriate goals of care for this gentleman.    HPI HPI: Matthew Bruce is an 86 y.o. male with medical history significant for history of CVA, type 2 diabetes mellitus, BPH, seizure disorder, right heel ulcer, and chronic kidney disease stage IV who [facility] as  a code stroke with right sided weakness and aphasia. MRI remarkable for "Acute small vessel infarct tracking from the posterior left corona radiata to the posterior left lentiform."      SLP Plan  Other (Comment) (daughter indicated MD wants repeat MBS in 2 days)       Recommendations  Diet recommendations: NPO Medication Administration: Via alternative means                Oral Care Recommendations: Oral care QID Follow up Recommendations: Skilled Nursing facility;24 hour supervision/assistance SLP Visit Diagnosis: Dysphagia, oropharyngeal phase (R13.12) Plan: Other (Comment) (daughter indicated MD wants repeat MBS in 2 days)       Matthew Bruce, [facility], [place] Speech Language Pathologist Office: [phone]  Matthew Bruce 12/25/2019, 11:45 AM

## 2019-12-25 NOTE — Progress Notes (Signed)
STROKE TEAM PROGRESS NOTE   INTERVAL HISTORY No family at bedside. Pt lethargic, able to open eyes on voice, no neuro changes. Speech on board, still did not pass swallow, high risk for silent aspiration. Will need PEG if aggressive care. Daughter agree to discuss with palliative care.     OBJECTIVE Vitals:   12/24/19 2350 12/25/19 0338 12/25/19 0728 12/25/19 1237  BP: (!) 162/75 (!) 158/75 (!) 152/74 (!) 146/72  Pulse: 92 92 91 93  Resp: 17 16 16 18   Temp: 97.9 F (36.6 C) 97.6 F (36.4 C) 97.6 F (36.4 C) 98 F (36.7 C)  TempSrc: Oral Oral Oral Oral  SpO2: 98% 99% 100% 100%  Weight:       CBC:  Recent Labs  Lab 12/26/2019 1508 12/31/2019 1515 12/24/19 0415 12/25/19 0323  WBC 6.0   < > 5.4 6.1  NEUTROABS 3.4  --   --   --   HGB 10.8*   < > 9.5* 9.6*  HCT 33.4*   < > 30.5* 29.8*  MCV 87.4   < > 87.9 84.7  PLT 329   < > 279 265   < > = values in this interval not displayed.   Basic Metabolic Panel:  Recent Labs  Lab 12/24/19 0415 12/25/19 0323  NA 143 142  K 3.9 3.5  CL 119* 115*  CO2 14* 18*  GLUCOSE 184* 254*  BUN 35* 53*  CREATININE 1.81* 1.35*  CALCIUM 9.6 9.4  MG 1.7 1.6*  PHOS 3.2 1.3*   Lipid Panel:     Component Value Date/Time   CHOL 121 12/21/2019 0312   TRIG 141 12/21/2019 0312   HDL 37 (L) 12/21/2019 0312   CHOLHDL 3.3 12/21/2019 0312   VLDL 28 12/21/2019 0312   LDLCALC 56 12/21/2019 0312   HgbA1c:  Lab Results  Component Value Date   HGBA1C 7.5 (H) 12/21/2019   Urine Drug Screen:     Component Value Date/Time   LABOPIA NONE DETECTED 12/11/2019 2229   COCAINSCRNUR NONE DETECTED 12/10/2019 2229   LABBENZ NONE DETECTED 12/24/2019 2229   AMPHETMU NONE DETECTED 12/26/2019 2229   THCU NONE DETECTED 12/11/2019 2229   LABBARB NONE DETECTED 12/12/2019 2229    Alcohol Level     Component Value Date/Time   ETH <10 12/10/2019 1508    IMAGING CT HEAD CODE STROKE WO CONTRAST 12/05/2019 IMPRESSION:  No CT evidence of acute intracranial  abnormality. ASPECTS is 10. Known small chronic infarcts within the brainstem and cerebellum, some of which were better appreciated on the prior MRI of 09/07/2016. Stable background mild cerebral white matter chronic small vessel ischemic disease and moderately advanced generalized cerebral atrophy. Ethmoid and right maxillary sinusitis. Bilateral mastoid effusions.   CT ANGIO NECK CODE STROKE 01/02/2020 1. The bilateral common and internal carotid arteries are patent within the neck without hemodynamically significant stenosis. Soft and calcified plaque within the bilateral carotid systems as described.  2. The vertebral arteries are patent within the neck bilaterally. Moderate atherosclerotic narrowing at the origin of the right vertebral artery.  3. Redemonstrated pulmonary opacity within the right upper lobe which is incompletely imaged on the current examination but measures at least 5.4 cm. This finding is nonspecific but malignancy remains a differential consideration. Follow-up contrast-enhanced chest CT or PET-CT should be considered for further evaluation   CT ANGIO HEAD CODE STROKE 12/07/2019 1. No intracranial large vessel occlusion is identified.  2. High-grade stenosis within the P2 left posterior cerebral artery.  3.  Calcified plaque within both intracranial internal carotid arteries. Moderate/severe stenosis of the paraclinoid right ICA. Moderate stenosis of the paraclinoid left ICA.   CT CEREBRAL PERFUSION W CONTRAST 12/06/2019 The perfusion software identifies no core infarct. The perfusion software identifies no critically hypoperfused parenchyma utilizing the Tmax>6 seconds threshold.   MRI Brain WO Contrast 12/21/19 1. Acute small vessel infarct tracking from the posterior left corona radiata to the posterior left lentiform. No associated hemorrhage or mass effect.  2. Possible additional punctate acute lacunar infarct in the anterior right frontal lobe white matter, such as  due to synchronous acute on chronic small vessel disease.  EEG adult 12/04/2019 ABNORMALITY  Continuous slow, generalized  IMPRESSION:  This study is suggestive of mild to moderate diffuse encephalopathy, nonspecific etiology. No seizures or epileptiform discharges were seen throughout the recording.   Transthoracic Echocardiogram  1. Left ventricular ejection fraction, by estimation, is 60 to 65%. The left ventricle has normal function. The left ventricle has no regional wall motion abnormalities. There is moderate left ventricular hypertrophy. Left ventricular diastolic parameters are consistent with Grade I diastolic dysfunction (impaired relaxation). Elevated left atrial pressure.  2. Right ventricular systolic function is normal. The right ventricular size is normal. There is mildly elevated pulmonary artery systolic pressure. The estimated right ventricular systolic pressure is 40.0 mmHg.  3. The mitral valve is degenerative. No evidence of mitral valve regurgitation. No evidence of mitral stenosis. Severe mitral annular calcification.  4. The aortic valve has been repaired/replaced. Aortic valve regurgitation is not visualized. No aortic stenosis is present. There is a St. Jude valve present in the aortic position. Echo findings are consistent with normal structure and function of the aortic valve prosthesis. Aortic valve mean gradient measures 9.3 mmHg. Aortic valve Vmax measures 2.08 m/s.  5. The inferior vena cava is normal in size with greater than 50% respiratory variability, suggesting right atrial pressure of 3 mmHg.  ECG - sinus or ectopic atrial rythym - rate 91 BPM. (See cardiology reading for complete details)   PHYSICAL EXAM   Temp:  [97.6 F (36.4 C)-98 F (36.7 C)] 98 F (36.7 C) (09/22 1237) Pulse Rate:  [91-96] 93 (09/22 1237) Resp:  [16-18] 18 (09/22 1237) BP: (146-162)/(71-79) 146/72 (09/22 1237) SpO2:  [98 %-100 %] 100 % (09/22 1237)  General - Well  nourished, well developed, lethargic  Ophthalmologic - fundi not visualized due to noncooperation.  Cardiovascular - Regular rhythm and rate.  Neuro - lethargic, eyes open on voice. Orientated to age and people but not to place or time. Severe dysarthria. Able to have bilateral gaze on voice. Frequent spontaneous blinking, not able to test visual field. PERRL. Right facial droop. Tongue midline protrusion. RUE flaccid, RLE slight withdraw to pain not against gravity. LUE 3/5 and LLE 2/5 on pain stimulation. No babinski bilaterally. Sensation, coordination and gait not tested.   ASSESSMENT/PLAN [name] is a 86 y.o. male with history of CVA, type 2 diabetes mellitus, HTN, HLD, hearing impaired (bilateral hearing aids), CAD (MI - CABG), BPH, ASPVD, AVR (St Jude - Porcine) seizure disorder, right heel ulcer, and chronic kidney disease stage IV presenting with speech difficulties and right sided weakness. The patient received IV t-PA Friday 12/19/2019 at 1530.  Stroke: left BG and CR infarct as well as punctate right frontal WM infarct s/p tPA, cardioembolic source vs. Synchronized small vessel disease.  CT Head - No CT evidence of acute intracranial abnormality. ASPECTS is 10. Known small chronic infarcts within  the brainstem and cerebellum..   CTA Head and neck - High-grade stenosis within the P2 left posterior cerebral artery. Moderate/severe stenosis of the paraclinoid right ICA. Moderate stenosis of the paraclinoid left ICA. No intracranial large vessel occlusion is identified.   CTP - neg.  MRI head - left BG and CR infarct as well as punctate right frontal WM infarct  EEG negative for seizrue, mild to moderate diffuse encephalopathy  2D Echo - EF 60-65%, st Jude AV  Consider 30 day cardiac event monitoring as outpt depends on GOC  [employer] Virus 2 - negative  LDL - 56  HgbA1c - 7.5  UDS - negative  VTE prophylaxis - SCDs  clopidogrel 75 mg daily prior to  admission, now on ASA 325 mg per tube. No DAPT for now due to potential PEG placement   Therapy recommendations:  SNF  Disposition:  Pending  Given poor condition PTA and now stroke w/ poor prognosis and dysphagia, daughter requested to speak w/ palliative care. reconsulted today (had placed 9/20 and canceled when dtr selected PEG) think a consult would benefit  Hx of stroke and seizure  11/2014, admitted for dizziness, found to have right cerebellum infarct, put on aspirin  09/2016, presented with seizure x2, put on Keppra, MRI negative.  Continue Keppra on discharge, no more seizure since  UTI  UA WBC > 50  Urine culture - >100k Klebsiella - sensitive to ancef  Rocephin 9/18>>9/20  Ancef 9/20>>9/22  Dysphagia   Did not pass swallow  Now NPO S/p cortrak  Off IVF  on TF and free water  Speech following  MBSS 9/20 -> NPO recommended w/ meds via alternative means  discussed potential PEG with daughter. She agrees to talk with palliative care (consulted 9/22) to discuss [place]  Hypertension  Home BP meds: Norvasc  Stable on the high end  Resume home norvasc 5 bid  BP goal < 180/105 . Long-term BP goal normotensive  Hyperlipidemia  Home Lipid lowering medication: Mevacor 40 mg daily  LDL 56, goal < 70  Resume pravastatin 40  Continue statin at discharge  Diabetes  Home diabetic meds: metformin ; Glucotrol  Current diabetic meds: SSI   HgbA1c 7.5, goal < 7.0  CBG monitoring  Hyperglycemia  Diabetic coordinator consult requested  Close PCP follow-up  Other Stroke Risk Factors  Advanced age  Former cigarette smoker - quit  Coronary artery disease  Other Active Problems  Code status - Full code  Seizure disorder - Keppra 500 bid  Anemia due to CKD- Hgb - 10.7->10.8->9.9->9.5->9.6  CKD stage IV - creatinine -2.32->2.35->2.33->2.29->1.81->1.35  Pulmonary opacity within the right upper lobe which is incompletely imaged - measures at least  5.4 cm. - follow-up contrast-enhanced chest CT or PET-CT should be considered for further evaluation.  Right heel ulcer - on dressing (bed bound at home largely but able to stand up with walker). WOC consulted. Ulcer unstageable d/t chronicity. dressing orders put in place. To resume f/uy w/ Dr. Cleda Mccreedy at d/c..  Hospital day # 5  Rosalin Hawking, MD PhD Stroke Neurology 12/25/2019 3:28 PM  To contact Stroke Continuity provider, please refer to [email or web address] After hours, contact General Neurology

## 2019-12-25 NOTE — Progress Notes (Signed)
Inpatient Diabetes Program Recommendations  AACE/ADA: New Consensus Statement on Inpatient Glycemic Control (2015)  Target Ranges:  Prepandial:   less than 140 mg/dL      Peak postprandial:   less than 180 mg/dL (1-2 hours)      Critically ill patients:  140 - 180 mg/dL   Lab Results  Component Value Date   GLUCAP 267 (H) 12/25/2019   HGBA1C 7.5 (H) 12/21/2019    Review of Glycemic Control  Diabetes history: DM 2 Outpatient Diabetes medications: Glipizide 5 mg Daily, metformin 1000 mg bid Current orders for Inpatient glycemic control:  Novolog 0-15 units Q4 hours  Osmolite 1.5 at 55 ml/hour  Inpatient Diabetes Program Recommendations:    -  Consider Novolog 5 units Q5 hours Tube Feed Coverage in addition to Correction scale.  (do not give if tube feeds are stopped or held)  Thanks,  Tama Headings RN, MSN, BC-ADM Inpatient Diabetes Coordinator Team Pager [phone] (8a-5p)

## 2019-12-26 ENCOUNTER — Inpatient Hospital Stay (HOSPITAL_COMMUNITY): Payer: Medicare HMO

## 2019-12-26 DIAGNOSIS — T17908A Unspecified foreign body in respiratory tract, part unspecified causing other injury, initial encounter: Secondary | ICD-10-CM

## 2019-12-26 DIAGNOSIS — Z66 Do not resuscitate: Secondary | ICD-10-CM

## 2019-12-26 LAB — PHOSPHORUS: Phosphorus: <1 mg/dL — CL (ref 2.5–4.6)

## 2019-12-26 LAB — GLUCOSE, CAPILLARY
Glucose-Capillary: 251 mg/dL — ABNORMAL HIGH (ref 70–99)
Glucose-Capillary: 264 mg/dL — ABNORMAL HIGH (ref 70–99)
Glucose-Capillary: 307 mg/dL — ABNORMAL HIGH (ref 70–99)
Glucose-Capillary: 311 mg/dL — ABNORMAL HIGH (ref 70–99)
Glucose-Capillary: 234 mg/dL — ABNORMAL HIGH (ref 70–99)

## 2019-12-26 LAB — MAGNESIUM: Magnesium: 1.7 mg/dL (ref 1.7–2.4)

## 2019-12-26 MED ORDER — SODIUM CHLORIDE 0.9 % IV SOLN: INTRAVENOUS | Status: DC

## 2019-12-26 MED ORDER — GUAIFENESIN ER 600 MG PO TB12: 600.0000 mg | ORAL_TABLET | Freq: Two times a day (BID) | ORAL | Status: DC | PRN

## 2019-12-26 MED ORDER — GUAIFENESIN 100 MG/5ML PO SOLN
15.0000 mL | Freq: Four times a day (QID) | ORAL | Status: DC | PRN
  Administered 2019-12-27: 300 mg
  Filled 2019-12-26: qty 10
  Filled 2019-12-26: qty 5

## 2019-12-26 MED ORDER — POTASSIUM & SODIUM PHOSPHATES 280-160-250 MG PO PACK
1.0000 | PACK | Freq: Four times a day (QID) | ORAL | Status: DC
  Administered 2019-12-26 – 2019-12-27 (×3): 1 via ORAL
  Filled 2019-12-26 (×6): qty 1

## 2019-12-26 MED ORDER — INSULIN ASPART 100 UNIT/ML ~~LOC~~ SOLN: 8.0000 [IU] | SUBCUTANEOUS | Status: DC | PRN

## 2019-12-26 MED ORDER — POTASSIUM PHOSPHATES 15 MMOLE/5ML IV SOLN
30.0000 mmol | Freq: Once | INTRAVENOUS | Status: AC
  Administered 2019-12-26: 30 mmol via INTRAVENOUS
  Filled 2019-12-26: qty 10

## 2019-12-26 MED ORDER — INSULIN ASPART 100 UNIT/ML ~~LOC~~ SOLN: 8.0000 [IU] | SUBCUTANEOUS | Status: DC

## 2019-12-26 NOTE — Progress Notes (Addendum)
RN notified pt w/ poor tolerance of TF. Pt congestion. NTS w/ large amounts. TF held. VSS.   Will continue to hold TF at this time. Check CXR. Continue NTS prn. Check labs in am.  Alerted Dr. Peggye Pitt, MSN, APRN, ANVP-BC, AGPCNP-BC Advanced Practice Stroke Nurse [place] for Schedule & Pager information 12/26/2019 4:46 PM   (non-billable note)

## 2019-12-26 NOTE — Progress Notes (Signed)
Called by Radiology regarding the focal right upper lobe lung opacity seen on CXR performed tonight. The finding has been seen on prior studies, including on CT chest from 05/26/16, and has been read on prior reports as well as tonight's CXR as being concerning for possible neoplasm.   Given the patient's advanced age and tenuous neurological condition, further work up may or may not be indicated. I will defer to AM stroke team regarding possible further imaging of the lesion.   Electronically signed: Dr. Kerney Elbe

## 2019-12-26 NOTE — Progress Notes (Addendum)
Serum phosphorus has dropped significantly this admission, 3.2 (9/21 0415) >> 1.3 (9/22 0323) >> < 1.0 (today at 0411).   Per UpToDate, if the serum phosphate is less than 1.5 mg/dL (0.48 mmol/L), 1.3 mmol/kg of elemental phosphorus (up to a maximum of 100 mmol) can be given in three to four divided doses over a 24-hour period. Pharmacy has been consulted; they will administer an initial IV dose followed by PO doses q6h x 24 hours.   Will also need Nephrology consult to determine the underlying etiology for the hypophosphatemia. Stroke Team to contact Nephrology during AM rounds.   Electronically signed: Dr. Kerney Elbe

## 2019-12-26 NOTE — Progress Notes (Signed)
Inpatient Diabetes Program Recommendations  AACE/ADA: New Consensus Statement on Inpatient Glycemic Control (2015)  Target Ranges:  Prepandial:   less than 140 mg/dL      Peak postprandial:   less than 180 mg/dL (1-2 hours)      Critically ill patients:  140 - 180 mg/dL   Lab Results  Component Value Date   GLUCAP 264 (H) 12/26/2019   HGBA1C 7.5 (H) 12/21/2019    Review of Glycemic Control Results for SHEIKH, LEVERICH (MRN [id number]) as of 12/26/2019 09:34  Ref. Range 12/25/2019 20:04 12/25/2019 23:54 12/26/2019 04:15 12/26/2019 07:34  Glucose-Capillary Latest Ref Range: 70 - 99 mg/dL 318 (H) 266 (H) 251 (H) 264 (H)   Diabetes history: DM 2 Outpatient Diabetes medications: Glipizide 5 mg Daily, metformin 1000 mg bid Current orders for Inpatient glycemic control:  Novolog 0-15 units Q4 hours, Novolog 5 units Q4H  Osmolite 1.5 at 55 ml/hour  Inpatient Diabetes Program Recommendations:    -  Consider  Increasing Novolog to  8 units Q5 hours Tube Feed Coverage in addition to Correction scale.  (do not give if tube feeds are stopped or held)  Thanks,  Bronson Curb, MSN, RNC-OB Diabetes Coordinator [phone] (8a-5p)

## 2019-12-26 NOTE — Progress Notes (Signed)
RT called by RN to NTS pt per family request. RT NTS pt right nare with copious amount of thick white secretions.  Pt tolerated well.

## 2019-12-26 NOTE — Consult Note (Signed)
Consultation Note Date: 12/26/2019   Patient Name: Matthew Bruce  DOB: 14-Oct-1926  MRN: [id number]  Age / Sex: 86 y.o., male  PCP: [name], MD Referring Physician: Rosalin Hawking, MD  Reason for Consultation: Establishing goals of care and Psychosocial/spiritual support  HPI/Patient Profile: 86 y.o. male   admitted on 12/22/2019 with past  medical history significant forhistory of CVA, type 2 diabetes mellitus, BPH, seizure disorder, right heel ulcer, and chronic kidney disease stage IV who was [facility] as a code stroke.  Patient lives at home with his family, daughter is the main caregiver.  Patient has had continued physical and functional decline over the past year per daughter.  Care was getting more difficult.  Admitted after EMS transport and code stroke was called in route.  Noncontrast CT scan of the head was obtained which showed no acute abnormality,  TPA it was administered uneventfully.  CT angiogram was obtained emergently with did not show major LVO but raise concern for decreased flow in the anterior temporal branch and CT perfusion was obtained which showed no significant core infarct or penumbra.    IMPRESSION:  MRI 12-21-19 1. Acute small vessel infarct tracking from the posterior left corona radiata to the posterior left lentiform. No associated hemorrhage or mass effect.  2. Possible additional punctate acute lacunar infarct in the anterior right frontal lobe white matter, such as due to synchronous acute on chronic small vessel disease.  Day 6 of hospital stay.    Patient is minimally responsive, unable to follow commands,   having dfficulty handling secretions, skin breakdown prior to this admission.   High risk for decompensation.  Family face treatment option decisions, advanced directive decisions and anticipatory care needs.    Clinical  Assessment and Goals of Care:   This NP Wadie Lessen reviewed medical records, received report from team, assessed the patient and then meet at the patient's bedside along with his daughter/Dee/main caregiver to discuss diagnosis, prognosis, GOC, EOL wishes disposition and options.   Concept of Palliative Care was introduced as specialized medical care for people and their families living with serious illness.  If focuses on providing relief from the symptoms and stress of a serious illness.  The goal is to improve quality of life for both the patient and the family.  Created space and opportunity for daughter to explore her thoughts and feelings regarding her father's current medical situation.  She verbalizes an understanding of the seriousness of the current medical situation.   She is tearful.  I"I don't want to let him go but I don't want to see him like this either"  Emotional support offered. We discussed human mortality and quality of life.     Education offered  today regarding advanced directives.  Concepts specific to code status, artifical feeding and hydration, continued IV antibiotics and rehospitalization was had.    The difference between a aggressive medical intervention path  and a palliative comfort care path for this patient at this time  was had.    Education offered regarding hospice benefit  Values and goals of care important to patient and family were attempted to be elicited.   MOST form introduced    Hard Choices booklet left for review   Natural trajectory and expectations at EOL were discussed.  Questions and concerns addressed.  Family encouraged to call with questions or concerns.     PMT will continue to support holistically.          No documented HPOA, 6 children work together for patient's best interest.  Acupuncturist is point of contact     SUMMARY OF RECOMMENDATIONS    Code Status/Advance Care Planning:  DNR   Palliative  Prophylaxis:   Aspiration, Bowel Regimen, Delirium Protocol, Frequent Pain Assessment and Oral Care  Additional Recommendations (Limitations, Scope, Preferences):  Continue current treatment plan thru the week-end.   Plan is to meet on Monday morning at 1000 with all siblings for further discussion regarding next steps and GOCs.  Discussed with Dr Erlinda Hong and Burnetta Sabin NP and they are in agreement with this plan.  Psycho-social/Spiritual:   Desire for further Chaplaincy support: declined, strong community church support  Additional Recommendations: Education on Hospice  Prognosis:  - dependant on desire for life prolonging measures, family is leaning toward a comfort path  Discharge Planning: To Be Determined      Primary Diagnoses: Present on Admission: . Stroke (cerebrum) ([place])   I have reviewed the medical record, interviewed the patient and family, and examined the patient. The following aspects are pertinent.  Past Medical History:  Diagnosis Date  . Anemia   . Aortic valve disorder   . Coronary artery disease   . Diabetes mellitus without complication ([place])   . Glaucoma   . History of BPH   . History of kidney stones   . HOH (hard of hearing)    Bilateral Hearing Aids  . Hyperlipidemia   . Hypertension   . Kidney stones   . MRSA (methicillin resistant staph aureus) culture positive 04/01/2016   LEFT FOOT  . Myocardial infarction ([place])   . Neuropathy   . Peripheral vascular disease ([place])    Social History   Socioeconomic History  . Marital status: Married    Spouse name: Not on file  . Number of children: Not on file  . Years of education: Not on file  . Highest education level: Not on file  Occupational History  . Not on file  Tobacco Use  . Smoking status: Former Smoker    Packs/day: 1.00    Types: Cigarettes  . Smokeless tobacco: Current User    Types: Chew  Vaping Use  . Vaping Use: Never used  Substance and Sexual Activity  . Alcohol use: No    . Drug use: No  . Sexual activity: Never  Other Topics Concern  . Not on file  Social History Narrative  . Not on file   Social Determinants of Health   Financial Resource Strain:   . Difficulty of Paying Living Expenses: Not on file  Food Insecurity:   . Worried About Charity fundraiser in the Last Year: Not on file  . Ran Out of Food in the Last Year: Not on file  Transportation Needs:   . Lack of Transportation (Medical): Not on file  . Lack of Transportation (Non-Medical): Not on file  Physical Activity:   . Days of Exercise per Week: Not on file  . Minutes of Exercise per  Session: Not on file  Stress:   . Feeling of Stress : Not on file  Social Connections:   . Frequency of Communication with Friends and Family: Not on file  . Frequency of Social Gatherings with Friends and Family: Not on file  . Attends Religious Services: Not on file  . Active Member of Clubs or Organizations: Not on file  . Attends Archivist Meetings: Not on file  . Marital Status: Not on file   Family History  Problem Relation Age of Onset  . Heart attack Mother   . Alcohol abuse Father   . Throat cancer Sister   . Heart attack Brother   . Heart failure Neg Hx    Scheduled Meds: . amLODipine  5 mg Per Tube BID  . aspirin  325 mg Per Tube Daily  . collagenase   Topical Daily  . feeding supplement (PROSource TF)  45 mL Per Tube TID  . free water  170 mL Per Tube Q4H  . heparin injection (subcutaneous)  5,000 Units Subcutaneous Q8H  . insulin aspart  0-15 Units Subcutaneous Q4H  . insulin aspart  5 Units Subcutaneous Q4H  . levETIRAcetam  500 mg Per Tube BID  . nutrition supplement (JUVEN)  1 packet Per Tube BID BM  . pantoprazole sodium  40 mg Per Tube Daily  . potassium & sodium phosphates  1 packet Oral Q6H  . pravastatin  40 mg Per Tube q1800   Continuous Infusions: . feeding supplement (OSMOLITE 1.5 CAL) 1,000 mL (12/26/19 0211)   PRN Meds:.acetaminophen **OR**  acetaminophen (TYLENOL) oral liquid 160 mg/5 mL **OR** acetaminophen, labetalol Medications Prior to Admission:  Prior to Admission medications   Medication Sig Start Date End Date Taking? Authorizing Provider  amLODipine (NORVASC) 5 MG tablet Take 5 mg by mouth 2 (two) times daily. 08/25/16  Yes [provider]  clopidogrel (PLAVIX) 75 MG tablet TAKE 1 TABLET BY MOUTH EVERY DAY Patient taking differently: Take 75 mg by mouth daily.  04/27/18  Yes Dew, Erskine Squibb, MD  collagenase (SANTYL) ointment Apply topically daily. Patient taking differently: Apply 1 application topically daily.  11/06/19  Yes Fritzi Mandes, MD  finasteride (PROSCAR) 5 MG tablet Take 5 mg by mouth at bedtime.    Yes [provider]  glipiZIDE (GLUCOTROL) 5 MG tablet Take 5 mg by mouth daily. 08/17/16  Yes [provider]  ibuprofen (ADVIL,MOTRIN) 800 MG tablet Take 800 mg by mouth daily.  02/22/18  Yes [provider]  latanoprost (XALATAN) 0.005 % ophthalmic solution Place 1 drop into both eyes at bedtime.   Yes [provider]  levETIRAcetam (KEPPRA) 500 MG tablet Take 1 tablet (500 mg total) by mouth 2 (two) times daily. 09/08/16  Yes Sainani, Belia Heman, MD  lovastatin (MEVACOR) 40 MG tablet Take 40 mg by mouth daily.    Yes [provider]  magnesium oxide (MAG-OX) 400 MG tablet Take 400 mg by mouth daily.   Yes [provider]  metFORMIN (GLUCOPHAGE) 1000 MG tablet Take 1,000 mg by mouth 2 (two) times a day.    Yes [provider]  tamsulosin (FLOMAX) 0.4 MG CAPS capsule Take 1 capsule (0.4 mg total) by mouth daily. 05/28/16  Yes Demetrios Loll, MD   Allergies  Allergen Reactions  . Sulfa Antibiotics Other (See Comments)    Reaction: Severe Shaking, Weakness  . Macrobid [Nitrofurantoin Monohyd Macro] Diarrhea   Review of Systems  Unable to perform ROS: Acuity of condition  Physical Exam Constitutional:      Appearance: He is underweight. He is  ill-appearing.     Interventions: Nasal cannula in place.  Cardiovascular:     Rate and Rhythm: Normal rate.  Skin:    General: Skin is warm and dry.  Neurological:     Mental Status: He is lethargic.     Vital Signs: BP (!) 133/59 (BP Location: Left Arm)   Pulse 85   Temp 98.7 F (37.1 C) (Axillary)   Resp (!) 24   Wt 89.5 kg   SpO2 97%   BMI 26.76 kg/m  Pain Scale: 0-10   Pain Score: 0-No pain   SpO2: SpO2: 97 % O2 Device:SpO2: 97 % O2 Flow Rate: .   IO: Intake/output summary:   Intake/Output Summary (Last 24 hours) at 12/26/2019 1315 Last data filed at 12/26/2019 0419 Gross per 24 hour  Intake 80 ml  Output 1050 ml  Net -970 ml    LBM: Last BM Date: 12/23/19 Baseline Weight: Weight: 76.2 kg Most recent weight: Weight: 89.5 kg     Palliative Assessment/Data: 20 %   Discussed with Dr Erlinda Hong  Time In: 1245 Time Out: 1400 Time Total: 75 minutes Greater than 50%  of this time was spent counseling and coordinating care related to the above assessment and plan.  Signed by: Wadie Lessen, NP   Please contact Palliative Medicine Team phone at [phone] for questions and concerns.  For individual provider: See Shea Evans

## 2019-12-26 NOTE — Progress Notes (Signed)
STROKE TEAM PROGRESS NOTE   INTERVAL HISTORY Daughter at bedside. Pt more drowsy sleepy today, hard to arouse but still able to open eyes with repetitive stimulation. He has copious oral secretion today with frequent coughing. Felt to be congested. Suctioning showed oral secretions but also some tube feeding content. TF was discontinued. Palliative care consulted. Daughter is discussing with family members for [place] consideration.  OBJECTIVE Vitals:   12/26/19 0418 12/26/19 0800 12/26/19 1100 12/26/19 1500  BP: (!) 155/69 137/63 (!) 133/59 138/61  Pulse: 92 91 85 94  Resp: 18 20 (!) 24 (!) 24  Temp: 98.9 F (37.2 C) 98 F (36.7 C) 98.7 F (37.1 C) 98.9 F (37.2 C)  TempSrc: Oral Axillary Axillary Axillary  SpO2: 98% 99% 97% 96%  Weight: 89.5 kg      CBC:  Recent Labs  Lab 12/06/2019 1508 12/27/2019 1515 12/24/19 0415 12/25/19 0323  WBC 6.0   < > 5.4 6.1  NEUTROABS 3.4  --   --   --   HGB 10.8*   < > 9.5* 9.6*  HCT 33.4*   < > 30.5* 29.8*  MCV 87.4   < > 87.9 84.7  PLT 329   < > 279 265   < > = values in this interval not displayed.   Basic Metabolic Panel:  Recent Labs  Lab 12/24/19 0415 12/24/19 0415 12/25/19 0323 12/26/19 0411  NA 143  --  142  --   K 3.9  --  3.5  --   CL 119*  --  115*  --   CO2 14*  --  18*  --   GLUCOSE 184*  --  254*  --   BUN 35*  --  53*  --   CREATININE 1.81*  --  1.35*  --   CALCIUM 9.6  --  9.4  --   MG 1.7   < > 1.6* 1.7  PHOS 3.2   < > 1.3* <1.0*   < > = values in this interval not displayed.   Lipid Panel:     Component Value Date/Time   CHOL 121 12/21/2019 0312   TRIG 141 12/21/2019 0312   HDL 37 (L) 12/21/2019 0312   CHOLHDL 3.3 12/21/2019 0312   VLDL 28 12/21/2019 0312   LDLCALC 56 12/21/2019 0312   HgbA1c:  Lab Results  Component Value Date   HGBA1C 7.5 (H) 12/21/2019   Urine Drug Screen:     Component Value Date/Time   LABOPIA NONE DETECTED 12/04/2019 2229   COCAINSCRNUR NONE DETECTED 12/08/2019 2229   LABBENZ  NONE DETECTED 12/19/2019 2229   AMPHETMU NONE DETECTED 12/16/2019 2229   THCU NONE DETECTED 12/09/2019 2229   LABBARB NONE DETECTED 01/02/2020 2229    Alcohol Level     Component Value Date/Time   ETH <10 12/13/2019 1508    IMAGING CT HEAD CODE STROKE WO CONTRAST 12/19/2019 IMPRESSION:  No CT evidence of acute intracranial abnormality. ASPECTS is 10. Known small chronic infarcts within the brainstem and cerebellum, some of which were better appreciated on the prior MRI of 09/07/2016. Stable background mild cerebral white matter chronic small vessel ischemic disease and moderately advanced generalized cerebral atrophy. Ethmoid and right maxillary sinusitis. Bilateral mastoid effusions.   CT ANGIO NECK CODE STROKE 12/19/2019 1. The bilateral common and internal carotid arteries are patent within the neck without hemodynamically significant stenosis. Soft and calcified plaque within the bilateral carotid systems as described.  2. The vertebral arteries are patent within the  neck bilaterally. Moderate atherosclerotic narrowing at the origin of the right vertebral artery.  3. Redemonstrated pulmonary opacity within the right upper lobe which is incompletely imaged on the current examination but measures at least 5.4 cm. This finding is nonspecific but malignancy remains a differential consideration. Follow-up contrast-enhanced chest CT or PET-CT should be considered for further evaluation   CT ANGIO HEAD CODE STROKE 12/10/2019 1. No intracranial large vessel occlusion is identified.  2. High-grade stenosis within the P2 left posterior cerebral artery.  3. Calcified plaque within both intracranial internal carotid arteries. Moderate/severe stenosis of the paraclinoid right ICA. Moderate stenosis of the paraclinoid left ICA.   CT CEREBRAL PERFUSION W CONTRAST 12/14/2019 The perfusion software identifies no core infarct. The perfusion software identifies no critically hypoperfused parenchyma  utilizing the Tmax>6 seconds threshold.   MRI Brain WO Contrast 12/21/19 1. Acute small vessel infarct tracking from the posterior left corona radiata to the posterior left lentiform. No associated hemorrhage or mass effect.  2. Possible additional punctate acute lacunar infarct in the anterior right frontal lobe white matter, such as due to synchronous acute on chronic small vessel disease.  EEG adult 12/16/2019 ABNORMALITY  Continuous slow, generalized  IMPRESSION:  This study is suggestive of mild to moderate diffuse encephalopathy, nonspecific etiology. No seizures or epileptiform discharges were seen throughout the recording.   Transthoracic Echocardiogram  1. Left ventricular ejection fraction, by estimation, is 60 to 65%. The left ventricle has normal function. The left ventricle has no regional wall motion abnormalities. There is moderate left ventricular hypertrophy. Left ventricular diastolic parameters are consistent with Grade I diastolic dysfunction (impaired relaxation). Elevated left atrial pressure.  2. Right ventricular systolic function is normal. The right ventricular size is normal. There is mildly elevated pulmonary artery systolic pressure. The estimated right ventricular systolic pressure is 16.1 mmHg.  3. The mitral valve is degenerative. No evidence of mitral valve regurgitation. No evidence of mitral stenosis. Severe mitral annular calcification.  4. The aortic valve has been repaired/replaced. Aortic valve regurgitation is not visualized. No aortic stenosis is present. There is a St. Jude valve present in the aortic position. Echo findings are consistent with normal structure and function of the aortic valve prosthesis. Aortic valve mean gradient measures 9.3 mmHg. Aortic valve Vmax measures 2.08 m/s.  5. The inferior vena cava is normal in size with greater than 50% respiratory variability, suggesting right atrial pressure of 3 mmHg.  ECG - sinus or ectopic atrial  rythym - rate 91 BPM. (See cardiology reading for complete details)   PHYSICAL EXAM   Temp:  [98 F (36.7 C)-99.1 F (37.3 C)] 98.9 F (37.2 C) (09/23 1500) Pulse Rate:  [85-102] 94 (09/23 1500) Resp:  [18-24] 24 (09/23 1500) BP: (133-178)/(59-73) 138/61 (09/23 1500) SpO2:  [87 %-99 %] 96 % (09/23 1500) Weight:  [89.5 kg] 89.5 kg (09/23 0418)  General - Well nourished, well developed, lethargic, less responsive  Ophthalmologic - fundi not visualized due to noncooperation.  Cardiovascular - Regular rhythm and rate.  Neuro - lethargic, less responsive and hard to arouse. Eyes open with repetitive stimulation. Did not answer orientation questions. Frequent coughing due to oral secretions. Able to have bilateral gaze on voice. Not blinking to visual threat with forced eye opening. PERRL. Right facial droop. Tongue midline protrusion. RUE flaccid, LUE and BLE slight withdraw to pain not against gravity. No babinski bilaterally. Sensation, coordination and gait not tested.   ASSESSMENT/PLAN Mr. ERHARDT DADA is a 86 y.o. male  with history of CVA, type 2 diabetes mellitus, HTN, HLD, hearing impaired (bilateral hearing aids), CAD ([place] - CABG), BPH, ASPVD, AVR (St Jude - [place]) seizure disorder, right heel ulcer, and chronic kidney disease stage IV presenting with speech difficulties and right sided weakness. The patient received IV t-PA Friday 12/10/2019 at 1530.  Stroke: left BG and CR infarct as well as punctate right frontal WM infarct s/p tPA, cardioembolic source vs. Synchronized small vessel disease.  CT Head - No CT evidence of acute intracranial abnormality. ASPECTS is 10. Known small chronic infarcts within the brainstem and cerebellum..   CTA Head and neck - High-grade stenosis within the P2 left posterior cerebral artery. Moderate/severe stenosis of the paraclinoid right ICA. Moderate stenosis of the paraclinoid left ICA. No intracranial large vessel occlusion is identified.    CTP - neg.  MRI head - left BG and CR infarct as well as punctate right frontal WM infarct  EEG negative for seizrue, mild to moderate diffuse encephalopathy  2D Echo - EF 60-65%, st Jude AV  Consider 30 day cardiac event monitoring as outpt depends on GOC  [employer] Virus 2 - negative  LDL - 56  HgbA1c - 7.5  UDS - negative  VTE prophylaxis - SCDs  clopidogrel 75 mg daily prior to admission, now on ASA 325 mg per tube. No DAPT for now due to potential PEG placement and ongoing [place] discussion   Therapy recommendations:  SNF  Disposition:  Pending  Given poor condition PTA and now stroke w/ poor prognosis and dysphagia, daughter met w/ palliative care today. She will discuss plan further with family over weekend w/ ongoing palliative involvement. Follow up Monday.   Now DNR/DNI  Hx of stroke and seizure  11/2014, admitted for dizziness, found to have right cerebellum infarct, put on aspirin  09/2016, presented with seizure x2, put on Keppra, MRI negative.  Continue Keppra on discharge, no more seizure since  UTI  UA WBC > 50  Urine culture - >100k Klebsiella - sensitive to ancef  Rocephin 9/18>>9/20  Ancef 9/20>>9/22  Dysphagia   Did not pass swallow  Now NPO S/p cortrak  Off IVF -> back on IVF @ 50  on TF and free water -> discontinued due to congestion with coughing  Speech following  MBSS 9/20 -> NPO recommended w/ meds via alternative means  discussed potential PEG with daughter who met with palliative care. Follow up [place] discussion w/ family, for formal f/u Monday.  Aspiration   CXR pending  Copious oral secretion and frequent coughing  TF and free water on hold  Put back on IVF  NT suctioning  Palliative care on board  Hypertension  Home BP meds: Norvasc  Stable on the high end  Resume home norvasc 5 bid  BP goal < 180/105 . Long-term BP goal normotensive  Hyperlipidemia  Home Lipid lowering medication: Mevacor 40 mg  daily  LDL 56, goal < 70  Resume pravastatin 40  Continue statin at discharge depending on [place]  Diabetes  Home diabetic meds: metformin ; Glucotrol  Current diabetic meds: SSI   HgbA1c 7.5, goal < 7.0  CBG monitoring  Hyperglycemia  Diabetic coordinator on board  TF coverage to 8u q5h -> discontinued off TF  Close PCP follow-up  Other Stroke Risk Factors  Advanced age  Former cigarette smoker - quit  Coronary artery disease  Other Active Problems  Code status - Full code  Seizure disorder - Keppra 500 bid  Anemia  due to CKD- Hgb - 10.7->10.8->9.9->9.5->9.6  CKD stage IV - creatinine -2.32->2.35->2.33->2.29->1.81->1.35  Pulmonary opacity within the right upper lobe which is incompletely imaged - measures at least 5.4 cm. - follow-up contrast-enhanced chest CT or PET-CT should be considered for further evaluation.  Right heel ulcer - on dressing (bed bound at home largely but able to stand up with walker). WOC consulted. Ulcer unstageable d/t chronicity. dressing orders put in place. To resume f/uy w/ Dr. Cleda Mccreedy at d/c.Marland Kitchen  Hypophosphatemia Phos <1.0 - supplement  Hospital day # 6  I spent  35 minutes in total face-to-face time with the patient, more than 50% of which was spent in counseling and coordination of care, reviewing test results, images and medication, and discussing the diagnosis, treatment plan and potential prognosis. This patient's care requiresreview of multiple databases, neurological assessment, discussion with family, other specialists and medical decision making of high complexity. I had long discussion with daughter at bedside, updated pt current condition, treatment plan and potential poor prognosis, and answered all the questions. She expressed understanding and appreciation. I also discussed with palliative care NP [facility].   Rosalin Hawking, MD PhD Stroke Neurology 12/26/2019 3:38 PM  To contact Stroke Continuity provider, please refer to  [email or web address] After hours, contact General Neurology

## 2019-12-26 NOTE — Plan of Care (Signed)
  Problem: Nutrition: Goal: Risk of aspiration will decrease Outcome: Not Progressing  Patient with episode of respiratory distress that required suction of copious secretions. Small amount of secretions seeming to contain tube feed. See note. Tube feed stopped, family at bedside.  Problem: Education: Goal: Knowledge of disease or condition will improve Outcome: Progressing   Problem: Coping: Goal: Will identify appropriate support needs Outcome: Progressing  Family at bedside. Patient minimally responsive at this time, pulling away from staff or following commands when staff trying to suction him. Patient with loud rhonchi and congested breathing that can be heard without a stethescope.  CXR completed. Family verbalizing understanding after talking with MD that patient may be declining.

## 2019-12-26 NOTE — Progress Notes (Signed)
RT asked by RN to assess/NTS patient at this time. RT NTS pt left nare with copious return of thin white/clear fluid. Pt tolerated procedure well.

## 2019-12-26 NOTE — Progress Notes (Signed)
Patient Phosphorus level <1.0, Neurologist E Lindzen notified.

## 2019-12-26 NOTE — Progress Notes (Signed)
RN called to room after clinical supervisor had to assist patient by suctioning a large amount of secretions. Some tube feed present in secretions. Patient noted to have very congested rhonchi even without stethescope.  Per daughter, patient had an episode like this yesterday. Daughter also states she has noticed that yesterday and today "patient is less awake" and "not really talking to me". MD notified. Frequent suctioning provided by staff. Chest Xray ordered, tube feed stopped per order. Assisted patient to sit up. Daughter given permission by clinical supervisor to call family to visit with patients.

## 2019-12-27 DIAGNOSIS — Z7189 Other specified counseling: Secondary | ICD-10-CM

## 2019-12-27 DIAGNOSIS — R1312 Dysphagia, oropharyngeal phase: Secondary | ICD-10-CM

## 2019-12-27 DIAGNOSIS — Z66 Do not resuscitate: Secondary | ICD-10-CM

## 2019-12-27 DIAGNOSIS — Z515 Encounter for palliative care: Secondary | ICD-10-CM

## 2019-12-27 LAB — BASIC METABOLIC PANEL
Anion gap: 8 (ref 5–15)
BUN: 64 mg/dL — ABNORMAL HIGH (ref 8–23)
CO2: 19 mmol/L — ABNORMAL LOW (ref 22–32)
Calcium: 8.2 mg/dL — ABNORMAL LOW (ref 8.9–10.3)
Chloride: 115 mmol/L — ABNORMAL HIGH (ref 98–111)
Creatinine, Ser: 1.55 mg/dL — ABNORMAL HIGH (ref 0.61–1.24)
GFR calc Af Amer: 45 mL/min — ABNORMAL LOW (ref >60)
GFR calc non Af Amer: 39 mL/min — ABNORMAL LOW (ref >60)
Glucose, Bld: 183 mg/dL — ABNORMAL HIGH (ref 70–99)
Potassium: 3.7 mmol/L (ref 3.5–5.1)
Sodium: 142 mmol/L (ref 135–145)

## 2019-12-27 LAB — GLUCOSE, CAPILLARY
Glucose-Capillary: 141 mg/dL — ABNORMAL HIGH (ref 70–99)
Glucose-Capillary: 156 mg/dL — ABNORMAL HIGH (ref 70–99)
Glucose-Capillary: 202 mg/dL — ABNORMAL HIGH (ref 70–99)

## 2019-12-27 LAB — CBC
HCT: 24.5 % — ABNORMAL LOW (ref 39.0–52.0)
Hemoglobin: 8.2 g/dL — ABNORMAL LOW (ref 13.0–17.0)
MCH: 28.8 pg (ref 26.0–34.0)
MCHC: 33.5 g/dL (ref 30.0–36.0)
MCV: 86 fL (ref 80.0–100.0)
Platelets: 196 10*3/uL (ref 150–400)
RBC: 2.85 MIL/uL — ABNORMAL LOW (ref 4.22–5.81)
RDW: 14.6 % (ref 11.5–15.5)
WBC: 9.7 10*3/uL (ref 4.0–10.5)
nRBC: 0 % (ref 0.0–0.2)

## 2019-12-27 MED ORDER — GLYCOPYRROLATE 0.2 MG/ML IJ SOLN: 0.2000 mg | INTRAMUSCULAR | Status: DC | PRN

## 2019-12-27 MED ORDER — GLYCOPYRROLATE 1 MG PO TABS
1.0000 mg | ORAL_TABLET | ORAL | Status: DC | PRN
  Filled 2019-12-27: qty 1

## 2019-12-27 MED ORDER — LORAZEPAM 2 MG/ML PO CONC: 1.0000 mg | ORAL | Status: DC | PRN

## 2019-12-27 MED ORDER — BIOTENE DRY MOUTH MT LIQD: 15.0000 mL | OROMUCOSAL | Status: DC | PRN

## 2019-12-27 MED ORDER — ONDANSETRON HCL 4 MG/2ML IJ SOLN: 4.0000 mg | Freq: Four times a day (QID) | INTRAMUSCULAR | Status: DC | PRN

## 2019-12-27 MED ORDER — HALOPERIDOL 0.5 MG PO TABS: 0.5000 mg | ORAL_TABLET | ORAL | Status: DC | PRN

## 2019-12-27 MED ORDER — HALOPERIDOL LACTATE 5 MG/ML IJ SOLN: 0.5000 mg | INTRAMUSCULAR | Status: DC | PRN

## 2019-12-27 MED ORDER — LORAZEPAM 2 MG/ML IJ SOLN: 1.0000 mg | INTRAMUSCULAR | Status: DC | PRN

## 2019-12-27 MED ORDER — POLYVINYL ALCOHOL 1.4 % OP SOLN
1.0000 [drp] | Freq: Four times a day (QID) | OPHTHALMIC | Status: DC | PRN
  Filled 2019-12-27: qty 15

## 2019-12-27 MED ORDER — LORAZEPAM 1 MG PO TABS: 1.0000 mg | ORAL_TABLET | ORAL | Status: DC | PRN

## 2019-12-27 MED ORDER — ACETAMINOPHEN 325 MG PO TABS: 650.0000 mg | ORAL_TABLET | Freq: Four times a day (QID) | ORAL | Status: DC | PRN

## 2019-12-27 MED ORDER — ONDANSETRON 4 MG PO TBDP: 4.0000 mg | ORAL_TABLET | Freq: Four times a day (QID) | ORAL | Status: DC | PRN

## 2019-12-27 MED ORDER — ACETAMINOPHEN 650 MG RE SUPP: 650.0000 mg | Freq: Four times a day (QID) | RECTAL | Status: DC | PRN

## 2019-12-27 MED ORDER — MORPHINE 100MG IN NS 100ML (1MG/ML) PREMIX INFUSION
1.0000 mg/h | INTRAVENOUS | Status: DC
  Administered 2019-12-27: 1 mg/h via INTRAVENOUS
  Administered 2019-12-28: 2 mg/h via INTRAVENOUS
  Filled 2019-12-27: qty 100

## 2019-12-27 MED ORDER — HALOPERIDOL LACTATE 2 MG/ML PO CONC
0.5000 mg | ORAL | Status: DC | PRN
  Filled 2019-12-27: qty 0.3

## 2019-12-27 NOTE — Progress Notes (Signed)
Attempted to see pt this am.  Rapid response called on pt early this am per nursing because of O2 sats in 40s.  Pt on comfort measures at this time per nursing.  Will check back after the weekend.  Sanai Frick,OTR/L [phone]

## 2019-12-27 NOTE — Significant Event (Addendum)
Rapid Response Event Note   Reason for Call :  Acute oxygen desaturation (59% on 100% NRB) r/t copious secretions.  Initial Focused Assessment:  Pt lying in bed. Eyes open to voice. Lung sounds are rhonchus throughout. Pt audibly gargling on secretions. Thick, tan secretions suctioned out by NTS. Skin is warm, moist to touch. Pt noted to have low grade fever. Pt has weak cough, but is unable to clear secretions. Skin color is pale. Tube feeds are currently off, pt is not receiving continuous IV fluid.   Interventions:  -NTS -Recovered on 100% NRB, weaned down to [facility] -Continuous pulse oximetry monitoring set-up at bedside  Plan of Care:  -Palliative care meeting planned for Monday, staff working on coordinating meeting today as family is at bedside -Continuous pulse oximetry monitoring, PRN NTS -Aspiration precautions  Event Summary:  MD Notified: Dr. Erlinda Hong Call Time: [phone] Arrival Time: 6148 End Time: [place], RN

## 2019-12-27 NOTE — Progress Notes (Addendum)
RT called to NTS patient.RT went down the left nare with copious return of white/yellowish return.  There was some blood tinged secretions in the nose from NTS procedure.  Patient tolerated well. RT also suctioned the back of the mouth/throat with copious return of thick yellowish secretions.

## 2019-12-27 NOTE — Progress Notes (Signed)
Rapid Response called.  Patient O2 59% on Non-Rebreather.

## 2019-12-27 NOTE — Progress Notes (Signed)
This chaplain responded to the unit referral for EOL spiritual care.  The chaplain joined multiple generations of family at the Pt. bedside while they shared laughter and sorrow together. The chaplain offered F/U spiritual care as needed through a RN page. The chaplain's invitation for prayer was accepted and shared with the Pt. and family.

## 2019-12-27 NOTE — Progress Notes (Signed)
   12/27/19 0859  Assess: MEWS Score  ECG Heart Rate (!) 102  Resp (!) 34  SpO2 100 %  O2 Device Nasal Cannula  O2 Flow Rate (L/min) 5 L/min  Assess: MEWS Score  MEWS Temp 0  MEWS Systolic 0  MEWS Pulse 1  MEWS RR 2  MEWS LOC 0  MEWS Score 3  MEWS Score Color Yellow  Assess: if the MEWS score is Yellow or Red  Were vital signs taken at a resting state? Yes  Focused Assessment No change from prior assessment  Early Detection of Sepsis Score *See Row Information* Low  MEWS guidelines implemented *See Row Information* Yes  Treat  MEWS Interventions Administered scheduled meds/treatments  Pain Scale Faces  Pain Score 0  Take Vital Signs  Increase Vital Sign Frequency  Yellow: Q 2hr X 2 then Q 4hr X 2, if remains yellow, continue Q 4hrs  Escalate  MEWS: Escalate Yellow: discuss with charge nurse/RN and consider discussing with provider and RRT  Notify: Charge Nurse/RN  Name of Charge Nurse/RN Notified [place]a  Date Charge Nurse/RN Notified 12/27/19  Time Charge Nurse/RN Notified [phone]

## 2019-12-27 NOTE — Progress Notes (Signed)
   12/27/19 0849  Assess: MEWS Score  Temp 99.8 F (37.7 C)  BP (!) 159/72  ECG Heart Rate (!) 102  Resp (!) 38  SpO2 100 %  O2 Device Non-rebreather Mask  Assess: MEWS Score  MEWS Temp 0  MEWS Systolic 0  MEWS Pulse 1  MEWS RR 3  MEWS LOC 0  MEWS Score 4  MEWS Score Color Red  Assess: if the MEWS score is Yellow or Red  Were vital signs taken at a resting state? Yes  Focused Assessment No change from prior assessment  Early Detection of Sepsis Score *See Row Information* Low (3)  MEWS guidelines implemented *See Row Information* Yes  Treat  MEWS Interventions Administered scheduled meds/treatments  Pain Scale Faces  Pain Score 0  Take Vital Signs  Increase Vital Sign Frequency  Red: Q 1hr X 4 then Q 4hr X 4, if remains red, continue Q 4hrs  Escalate  MEWS: Escalate Red: discuss with charge nurse/RN and provider, consider discussing with RRT  Notify: Charge Nurse/RN  Name of Charge Nurse/RN Notified [place]  Date Charge Nurse/RN Notified 12/27/19  Time Charge Nurse/RN Notified 3536  Notify: Provider  Provider Name/Title Dr. Erlinda Hong  Date Provider Notified 12/27/19  Time Provider Notified [phone]  Notification Type Page  Notification Reason Other (Comment)  Response No new orders  Date of Provider Response 12/27/19  Notify: Rapid Response  Name of Rapid Response RN Notified Wilburn Cornelia  Date Rapid Response Notified 12/27/19  Time Rapid Response Notified 0835 (In room at the time)  Document  Patient Outcome Stabilized after interventions  Progress note created (see row info) Yes

## 2019-12-27 NOTE — Progress Notes (Signed)
PT Cancellation Note  Patient Details Name: JAYMIEN LANDIN MRN: [id number] DOB: 1926-08-14   Cancelled Treatment:    Reason Eval/Treat Not Completed: Medical issues which prohibited therapy. Pt with Rapid Response called this AM due to acute desaturation in the 50s. RN advising comfort measures in place right now and to hold off on treatment. Will check back as time allows and when medically stable.   Allena Katz 12/27/2019, 10:25 AM

## 2019-12-27 NOTE — Progress Notes (Signed)
   12/27/19 1836  Clinical Encounter Type  Visited With Patient and family together  Visit Type Patient actively dying  Referral From Nurse  Consult/Referral To Chaplain   Family requested prayer during transition. Chaplain prayed with family and supported with refreshments and presence. Chaplain was called to a trauma but remains available as needed.  This note was prepared by Chaplain Resident, Dante Gang, MDiv. Chaplain remains available as needed through the on-call pager: [phone].

## 2019-12-27 NOTE — Progress Notes (Signed)
SLP Cancellation Note  Patient Details Name: Matthew Bruce MRN: [id number] DOB: [birth year]   Cancelled treatment:       Reason Eval/Treat Not Completed: Other (comment);Medical issues which prohibited therapy (pt aspirating secretions, requiring NTS, MBS cancelled - MD agrees. Thanks.)  Kathleen Lime, MS [facility] SLP Acute Rehab Services Office [phone]   Macario Golds 12/27/2019, 8:17 AM

## 2019-12-27 NOTE — Progress Notes (Signed)
STROKE TEAM PROGRESS NOTE   INTERVAL HISTORY 2 daughters and 2 sons are at bedside.  Patient continues to have difficulty breathing with difficulty handling secretions.  Palliative care on board, discussed with family, currently patient transition to comfort care measures.  OBJECTIVE Vitals:   12/27/19 0835 12/27/19 0849 12/27/19 0859 12/27/19 1100  BP:  (!) 159/72  117/78  Pulse:    96  Resp:  (!) 38 (!) 34   Temp:  99.8 F (37.7 C)  99.9 F (37.7 C)  TempSrc:  Axillary  Axillary  SpO2: (!) 59% 100% 100% 99%  Weight:       CBC:  Recent Labs  Lab 12/30/2019 1508 12/05/2019 1515 12/25/19 0323 12/27/19 0218  WBC 6.0   < > 6.1 9.7  NEUTROABS 3.4  --   --   --   HGB 10.8*   < > 9.6* 8.2*  HCT 33.4*   < > 29.8* 24.5*  MCV 87.4   < > 84.7 86.0  PLT 329   < > 265 196   < > = values in this interval not displayed.   Basic Metabolic Panel:  Recent Labs  Lab 12/25/19 0323 12/26/19 0411 12/27/19 0218  NA 142  --  142  K 3.5  --  3.7  CL 115*  --  115*  CO2 18*  --  19*  GLUCOSE 254*  --  183*  BUN 53*  --  64*  CREATININE 1.35*  --  1.55*  CALCIUM 9.4  --  8.2*  MG 1.6* 1.7  --   PHOS 1.3* <1.0*  --    Lipid Panel:     Component Value Date/Time   CHOL 121 12/21/2019 0312   TRIG 141 12/21/2019 0312   HDL 37 (L) 12/21/2019 0312   CHOLHDL 3.3 12/21/2019 0312   VLDL 28 12/21/2019 0312   LDLCALC 56 12/21/2019 0312   HgbA1c:  Lab Results  Component Value Date   HGBA1C 7.5 (H) 12/21/2019   Urine Drug Screen:     Component Value Date/Time   LABOPIA NONE DETECTED 12/27/2019 2229   COCAINSCRNUR NONE DETECTED 12/04/2019 2229   LABBENZ NONE DETECTED 12/21/2019 2229   AMPHETMU NONE DETECTED 12/26/2019 2229   THCU NONE DETECTED 01/01/2020 2229   LABBARB NONE DETECTED 12/19/2019 2229    Alcohol Level     Component Value Date/Time   ETH <10 12/23/2019 1508    IMAGING CT HEAD CODE STROKE WO CONTRAST 12/22/2019 IMPRESSION:  No CT evidence of acute intracranial  abnormality. ASPECTS is 10. Known small chronic infarcts within the brainstem and cerebellum, some of which were better appreciated on the prior MRI of 09/07/2016. Stable background mild cerebral white matter chronic small vessel ischemic disease and moderately advanced generalized cerebral atrophy. Ethmoid and right maxillary sinusitis. Bilateral mastoid effusions.   CT ANGIO NECK CODE STROKE 01/02/2020 1. The bilateral common and internal carotid arteries are patent within the neck without hemodynamically significant stenosis. Soft and calcified plaque within the bilateral carotid systems as described.  2. The vertebral arteries are patent within the neck bilaterally. Moderate atherosclerotic narrowing at the origin of the right vertebral artery.  3. Redemonstrated pulmonary opacity within the right upper lobe which is incompletely imaged on the current examination but measures at least 5.4 cm. This finding is nonspecific but malignancy remains a differential consideration. Follow-up contrast-enhanced chest CT or PET-CT should be considered for further evaluation   CT ANGIO HEAD CODE STROKE 12/10/2019 1. No intracranial large vessel  occlusion is identified.  2. High-grade stenosis within the P2 left posterior cerebral artery.  3. Calcified plaque within both intracranial internal carotid arteries. Moderate/severe stenosis of the paraclinoid right ICA. Moderate stenosis of the paraclinoid left ICA.   CT CEREBRAL PERFUSION W CONTRAST 12/08/2019 The perfusion software identifies no core infarct. The perfusion software identifies no critically hypoperfused parenchyma utilizing the Tmax>6 seconds threshold.   MRI Brain WO Contrast 12/21/19 1. Acute small vessel infarct tracking from the posterior left corona radiata to the posterior left lentiform. No associated hemorrhage or mass effect.  2. Possible additional punctate acute lacunar infarct in the anterior right frontal lobe white matter, such as  due to synchronous acute on chronic small vessel disease.  EEG adult 12/13/2019 ABNORMALITY  Continuous slow, generalized  IMPRESSION:  This study is suggestive of mild to moderate diffuse encephalopathy, nonspecific etiology. No seizures or epileptiform discharges were seen throughout the recording.   Transthoracic Echocardiogram  1. Left ventricular ejection fraction, by estimation, is 60 to 65%. The left ventricle has normal function. The left ventricle has no regional wall motion abnormalities. There is moderate left ventricular hypertrophy. Left ventricular diastolic parameters are consistent with Grade I diastolic dysfunction (impaired relaxation). Elevated left atrial pressure.  2. Right ventricular systolic function is normal. The right ventricular size is normal. There is mildly elevated pulmonary artery systolic pressure. The estimated right ventricular systolic pressure is 14.4 mmHg.  3. The mitral valve is degenerative. No evidence of mitral valve regurgitation. No evidence of mitral stenosis. Severe mitral annular calcification.  4. The aortic valve has been repaired/replaced. Aortic valve regurgitation is not visualized. No aortic stenosis is present. There is a St. Jude valve present in the aortic position. Echo findings are consistent with normal structure and function of the aortic valve prosthesis. Aortic valve mean gradient measures 9.3 mmHg. Aortic valve Vmax measures 2.08 m/s.  5. The inferior vena cava is normal in size with greater than 50% respiratory variability, suggesting right atrial pressure of 3 mmHg.  ECG - sinus or ectopic atrial rythym - rate 91 BPM. (See cardiology reading for complete details)  DG CHEST PORT 1 VIEW 12/26/2019   1. No significant interval change in the appearance of the chest. Linear opacities at the LEFT lung base but may represent scarring or atelectasis. 2. Masslike opacity in the RIGHT upper chest remains indeterminate, this remains  concerning for potential neoplasm, consider workup of this abnormality if not yet performed. 3. Signs of CABG.     PHYSICAL EXAM   Temp:  [98.9 F (37.2 C)-101.3 F (38.5 C)] 99.9 F (37.7 C) (09/24 1100) Pulse Rate:  [94-100] 96 (09/24 1100) Resp:  [19-38] 34 (09/24 0859) BP: (117-169)/(46-78) 117/78 (09/24 1100) SpO2:  [59 %-100 %] 99 % (09/24 1100) Weight:  [90.4 kg] 90.4 kg (09/24 0431)  General - Well nourished, well developed, lethargic, less responsive, on NRB  Ophthalmologic - fundi not visualized due to noncooperation.  Cardiovascular - Regular rhythm and rate.  Neuro - limited exam due to comfort care measures, patient lethargic, not open eyes on voice, on nonrebreather, no significant distress at this time.  Right facial droop, no spontaneous movement of all extremities.   ASSESSMENT/PLAN Mr. SHAKIM FAITH is a 86 y.o. male with history of CVA, type 2 diabetes mellitus, HTN, HLD, hearing impaired (bilateral hearing aids), CAD (MI - CABG), BPH, ASPVD, AVR (St Jude - Porcine) seizure disorder, right heel ulcer, and chronic kidney disease stage IV presenting with speech difficulties  and right sided weakness. The patient received IV t-PA Friday 12/24/2019 at 1530.  Stroke: left BG and CR infarct as well as punctate right frontal WM infarct s/p tPA, cardioembolic source vs. Synchronized small vessel disease.  CT Head - No CT evidence of acute intracranial abnormality. ASPECTS is 10. Known small chronic infarcts within the brainstem and cerebellum..   CTA Head and neck - High-grade stenosis within the P2 left posterior cerebral artery. Moderate/severe stenosis of the paraclinoid right ICA. Moderate stenosis of the paraclinoid left ICA. No intracranial large vessel occlusion is identified.   CTP - neg.  MRI head - left BG and CR infarct as well as punctate right frontal WM infarct  EEG negative for seizrue, mild to moderate diffuse encephalopathy  2D Echo - EF 60-65%, st  Jude AV  [employer] Virus 2 - negative  LDL - 56  HgbA1c - 7.5  UDS - negative  clopidogrel 75 mg daily prior to admission  Given poor condition PTA and now stroke w/ poor prognosis and dysphagia, palliative care on board.  After discussion with family, patient now DNR/DNI, made comfort care by palliative care team  Hx of stroke and seizure  11/2014, admitted for dizziness, found to have right cerebellum infarct, put on aspirin  09/2016, presented with seizure x2, put on Keppra, MRI negative.  Continue Keppra on discharge, no more seizure since  UTI  UA WBC > 50  Urine culture - >100k Klebsiella - sensitive to ancef  Rocephin 9/18>>9/20  Ancef 9/20>>9/22  Dysphagia   Did not pass swallow  Now NPO S/p cortrak  Off IVF -> back on IVF @ 50  on TF and free water -> discontinued due to congestion with coughing  Speech following  MBSS 9/20 -> NPO recommended w/ meds via alternative means  discussed potential PEG with daughter who met with palliative care->comfort care now  Aspiration   CXR no significant changes, unchanged R upper chest mass possible neoplastic   Copious oral secretion and frequent coughing  TF and free water on hold  Put back on IVF  NT suctioning  Desaturation on NRB. RRT called  Palliative care involved->comfort care  Hypertension  Home BP meds: Norvasc  Stable on the high end  Was treated w/ norvasc 5 bid  Hyperlipidemia  Home Lipid lowering medication: Mevacor 40 mg daily  LDL 56, goal < 40  Was treated w/ pravastatin 40  Diabetes  Home diabetic meds: metformin ; Glucotrol  Current diabetic meds: SSI   HgbA1c 7.5, goal < 7.0  CBG monitoring  Hyperglycemia  Diabetic coordinator on board  TF coverage to 8u q5h -> discontinued off TF  Other Stroke Risk Factors  Advanced age  Former cigarette smoker - quit  Coronary artery disease  Other Active Problems  Code status - DNR/DNI  Seizure disorder -  Keppra 500 bid ->off  Anemia due to CKD- Hgb - 10.7->10.8->9.9->9.5->9.6->8.2  CKD stage IV - creatinine -2.32->2.35->2.33->2.29->1.81->1.35->1.55  Right heel ulcer - on dressing (bed bound at home largely but able to stand up with walker). WOC consulted. Ulcer unstageable d/t chronicity. dressing orders put in place.   Hospital day # 7   Matthew Hawking, MD PhD Stroke Neurology 12/27/2019 2:21 PM  To contact Stroke Continuity provider, please refer to [email or web address] After hours, contact General Neurology

## 2019-12-27 NOTE — Progress Notes (Signed)
Palliative:   [name] is lying in bed with nonrebreather in place, increased work of breathing noted.  His family is at bedside including daughter Matthew Bruce, sons Matthew Bruce and Matthew Bruce, grandson Matthew Bruce and son Matthew Bruce.  We talked in detail about the changes that have occurred for [name] over the last few hours.  We talked in detail about his respiratory status, nonrebreather, nasotracheal suctioning, secretions, ability to cough and protect airway.  I share that, "things are looking different".  We talked about how to care for [name].   We talked about prognosis with permission.  I share that I believe [name] is actively dying, and that he has hours to live even with full support (outside of extraordinary measures).  Family states understanding and agreement.  We talked about the concept of "let nature take its course".  We talked about comfort measures, what this would look like and feel like.  We talked in detail about symptom management including, but not limited to morphine infusion for breathlessness/pain, medications for anxiety, medications for secretion management.  Family shares that they are waiting for a sister, Matthew Bruce to arrive.  They share that son Matthew Bruce spent the night with [name] last night, but has returned to Matthew Bruce.  Family asked that I come back and speak with daughter Matthew Bruce when she arrives.  Detail conference with bedside nursing staff, transition of care team, charge nurse related to patient condition, needs, goals of care.  PMT notified the daughter Matthew Bruce is at bedside.  We talked about the acute changes that have occurred for [name], prognosis with permission, the concept of comfort and dignity at end-of-life, let nature take its course.  At this point, all family present are in agreement for comfort measures only.  We talked about what is and is not provided.  Family is in agreement for no further lab draws or sticks.  Medicines for comfort only.  Orders  adjusted.  PMT returns to check on patient, bedside nursing staff related to symptom management effectiveness and needs.  Detail conference with attending, bedside nursing staff, transition of care team related to full comfort care now.  Anticipate in hospital death.  Orders adjusted.  Plan:  FULL COMFORT care, let nature take it's course.  Prognosis:  Hours anticipated, in hospital death.  Discussed with family with permission  65 minutes, extended time  Matthew Axe, NP Palliative medicine team Team phone [phone] Greater than 50% of this time was spent counseling and coordinating care related to the above assessment and plan.

## 2019-12-27 NOTE — Plan of Care (Signed)
  Problem: Nutrition: Goal: Adequate nutrition will be maintained 12/27/2019 1149 by Caroll Rancher, RN Outcome: Not Progressing 12/27/2019 1148 by Caroll Rancher, RN Outcome: Progressing

## 2019-12-27 NOTE — Progress Notes (Addendum)
Pt's breathing sound was very wheezing , rhonchi, congested, requested RT if she can come to see pt , and  paged oncall provider MD E. Lindzen.

## 2019-12-27 NOTE — Progress Notes (Signed)
This note also relates to the following rows which could not be included: ECG Heart Rate - Cannot attach notes to unvalidated device data Resp - Cannot attach notes to unvalidated device data    12/27/19 1100  Assess: MEWS Score  Temp 99.9 F (37.7 C)  BP 117/78  Pulse Rate 96  Level of Consciousness Responds to Voice  SpO2 99 %  O2 Device Non-rebreather Mask  O2 Flow Rate (L/min) 15 L/min  Assess: MEWS Score  MEWS Temp 0  MEWS Systolic 0  MEWS Pulse 0  MEWS RR 2  MEWS LOC 1  MEWS Score 3  MEWS Score Color Yellow  Assess: if the MEWS score is Yellow or Red  Were vital signs taken at a resting state? Yes  Focused Assessment No change from prior assessment  Early Detection of Sepsis Score *See Row Information* Low  MEWS guidelines implemented *See Row Information* Yes  Treat  MEWS Interventions Administered scheduled meds/treatments  Pain Scale Faces  Pain Score 0  Take Vital Signs  Increase Vital Sign Frequency  Yellow: Q 2hr X 2 then Q 4hr X 2, if remains yellow, continue Q 4hrs  Escalate  MEWS: Escalate Yellow: discuss with charge nurse/RN and consider discussing with provider and RRT  Notify: Charge Nurse/RN  Name of Charge Nurse/RN Notified Courtlanda  Date Charge Nurse/RN Notified 12/27/19  Time Charge Nurse/RN Notified 1110

## 2020-01-03 NOTE — Progress Notes (Signed)
   01/04/2020 0824  Clinical Encounter Type  Visited With Family  Visit Type Patient actively dying;Death  Referral From Nurse  Consult/Referral To Chaplain   Nurse paged Chaplain saying that Pt was actively dying. Chaplain prayed with Pt's family and offered emotional support. Chaplain remains available as needed.  This note was prepared by Chaplain Resident, Dante Gang, MDiv. Chaplain remains available as needed through the on-call pager: [phone].

## 2020-01-03 NOTE — Progress Notes (Signed)
Patient expired at 49. Two RN's verified. MD paged. Support provided to family.

## 2020-01-03 NOTE — Progress Notes (Signed)
RN called into room by family at 6:15 because pt appeared to be uncomfortable.  Family asked that morphine rate be increased at this time.  Pt had 32 RR per minute and appeared to be labored.  RN did titrate drip from 1mg /hr to 2mg /hr at family's request.  Patient was orally suctioned and repositioned at this time.

## 2020-01-03 NOTE — Plan of Care (Signed)
Late entry:  Death certificate signed and gave to RN.   Rosalin Hawking, MD PhD Stroke Neurology 2020-01-06 12:55 PM
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
## 2020-01-03 NOTE — Progress Notes (Signed)
Morphine drip wasted in stericycle with Levada Dy, RN.

## 2020-01-03 DEATH — deceased

## 2022-01-02 DEATH — deceased
